# Patient Record
Sex: Female | Born: 1970 | Race: Black or African American | Hispanic: No | Marital: Single | Smoking: Former smoker
Health system: Southern US, Community
[De-identification: ages and names within clinical notes are randomized; demographics above are authoritative.]

## PROBLEM LIST (undated history)

## (undated) DIAGNOSIS — F32A Depression, unspecified: Secondary | ICD-10-CM

## (undated) DIAGNOSIS — I10 Essential (primary) hypertension: Secondary | ICD-10-CM

## (undated) DIAGNOSIS — E119 Type 2 diabetes mellitus without complications: Secondary | ICD-10-CM

## (undated) DIAGNOSIS — G709 Myoneural disorder, unspecified: Secondary | ICD-10-CM

## (undated) DIAGNOSIS — E785 Hyperlipidemia, unspecified: Secondary | ICD-10-CM

## (undated) DIAGNOSIS — F17200 Nicotine dependence, unspecified, uncomplicated: Secondary | ICD-10-CM

## (undated) DIAGNOSIS — G459 Transient cerebral ischemic attack, unspecified: Secondary | ICD-10-CM

## (undated) DIAGNOSIS — G35 Multiple sclerosis: Secondary | ICD-10-CM

## (undated) DIAGNOSIS — S31000A Unspecified open wound of lower back and pelvis without penetration into retroperitoneum, initial encounter: Secondary | ICD-10-CM

## (undated) DIAGNOSIS — I639 Cerebral infarction, unspecified: Secondary | ICD-10-CM

## (undated) DIAGNOSIS — H8110 Benign paroxysmal vertigo, unspecified ear: Secondary | ICD-10-CM

## (undated) DIAGNOSIS — F329 Major depressive disorder, single episode, unspecified: Secondary | ICD-10-CM

---

## 2002-10-17 ENCOUNTER — Emergency Department (HOSPITAL_COMMUNITY): Admission: EM | Admit: 2002-10-17 | Discharge: 2002-10-18 | Payer: Self-pay

## 2002-10-19 ENCOUNTER — Emergency Department (HOSPITAL_COMMUNITY): Admission: EM | Admit: 2002-10-19 | Discharge: 2002-10-19 | Payer: Self-pay | Admitting: Emergency Medicine

## 2002-12-07 ENCOUNTER — Other Ambulatory Visit: Admission: RE | Admit: 2002-12-07 | Discharge: 2002-12-07 | Payer: Self-pay | Admitting: *Deleted

## 2003-02-01 ENCOUNTER — Ambulatory Visit (HOSPITAL_COMMUNITY): Admission: RE | Admit: 2003-02-01 | Discharge: 2003-02-01 | Payer: Self-pay | Admitting: *Deleted

## 2003-09-08 ENCOUNTER — Emergency Department (HOSPITAL_COMMUNITY): Admission: EM | Admit: 2003-09-08 | Discharge: 2003-09-08 | Payer: Self-pay | Admitting: Family Medicine

## 2004-01-12 ENCOUNTER — Emergency Department (HOSPITAL_COMMUNITY): Admission: EM | Admit: 2004-01-12 | Discharge: 2004-01-12 | Payer: Self-pay | Admitting: Emergency Medicine

## 2004-05-24 ENCOUNTER — Emergency Department (HOSPITAL_COMMUNITY): Admission: EM | Admit: 2004-05-24 | Discharge: 2004-05-24 | Payer: Self-pay | Admitting: Family Medicine

## 2004-12-21 ENCOUNTER — Ambulatory Visit: Payer: Self-pay | Admitting: Internal Medicine

## 2004-12-25 ENCOUNTER — Ambulatory Visit: Payer: Self-pay | Admitting: *Deleted

## 2004-12-29 ENCOUNTER — Ambulatory Visit: Payer: Self-pay | Admitting: Internal Medicine

## 2005-09-20 ENCOUNTER — Ambulatory Visit: Payer: Self-pay | Admitting: Family Medicine

## 2005-09-24 ENCOUNTER — Encounter (INDEPENDENT_AMBULATORY_CARE_PROVIDER_SITE_OTHER): Payer: Self-pay | Admitting: Internal Medicine

## 2005-10-04 ENCOUNTER — Ambulatory Visit: Payer: Self-pay | Admitting: Family Medicine

## 2005-11-05 ENCOUNTER — Emergency Department (HOSPITAL_COMMUNITY): Admission: EM | Admit: 2005-11-05 | Discharge: 2005-11-05 | Payer: Self-pay | Admitting: Emergency Medicine

## 2005-11-14 ENCOUNTER — Ambulatory Visit: Payer: Self-pay | Admitting: Family Medicine

## 2005-12-05 DIAGNOSIS — G35 Multiple sclerosis: Secondary | ICD-10-CM

## 2006-01-24 ENCOUNTER — Emergency Department (HOSPITAL_COMMUNITY): Admission: EM | Admit: 2006-01-24 | Discharge: 2006-01-24 | Payer: Self-pay | Admitting: Emergency Medicine

## 2006-05-31 ENCOUNTER — Ambulatory Visit: Payer: Self-pay | Admitting: Internal Medicine

## 2006-06-06 ENCOUNTER — Ambulatory Visit: Payer: Self-pay | Admitting: Internal Medicine

## 2006-06-28 ENCOUNTER — Emergency Department (HOSPITAL_COMMUNITY): Admission: EM | Admit: 2006-06-28 | Discharge: 2006-06-28 | Payer: Self-pay | Admitting: Emergency Medicine

## 2006-09-26 ENCOUNTER — Ambulatory Visit: Payer: Self-pay | Admitting: Internal Medicine

## 2006-09-26 LAB — CONVERTED CEMR LAB
Basophils Absolute: 0 10*3/uL (ref 0.0–0.1)
Basophils Relative: 0 % (ref 0–1)
Eosinophils Absolute: 0.1 10*3/uL (ref 0.0–0.7)
Hemoglobin: 14 g/dL (ref 12.0–15.0)
MCHC: 33.6 g/dL (ref 30.0–36.0)
MCV: 95.4 fL (ref 78.0–100.0)
Monocytes Absolute: 0.5 10*3/uL (ref 0.2–0.7)
Neutro Abs: 6 10*3/uL (ref 1.7–7.7)
RDW: 13.4 % (ref 11.5–14.0)

## 2006-09-30 ENCOUNTER — Encounter (INDEPENDENT_AMBULATORY_CARE_PROVIDER_SITE_OTHER): Payer: Self-pay | Admitting: Internal Medicine

## 2006-09-30 DIAGNOSIS — I10 Essential (primary) hypertension: Secondary | ICD-10-CM

## 2006-09-30 DIAGNOSIS — E785 Hyperlipidemia, unspecified: Secondary | ICD-10-CM

## 2006-09-30 DIAGNOSIS — E119 Type 2 diabetes mellitus without complications: Secondary | ICD-10-CM

## 2006-09-30 DIAGNOSIS — F17200 Nicotine dependence, unspecified, uncomplicated: Secondary | ICD-10-CM

## 2006-09-30 HISTORY — DX: Essential (primary) hypertension: I10

## 2006-09-30 HISTORY — DX: Hyperlipidemia, unspecified: E78.5

## 2006-09-30 HISTORY — DX: Nicotine dependence, unspecified, uncomplicated: F17.200

## 2006-09-30 HISTORY — DX: Type 2 diabetes mellitus without complications: E11.9

## 2006-11-20 ENCOUNTER — Telehealth (INDEPENDENT_AMBULATORY_CARE_PROVIDER_SITE_OTHER): Payer: Self-pay | Admitting: Internal Medicine

## 2007-08-21 ENCOUNTER — Telehealth (INDEPENDENT_AMBULATORY_CARE_PROVIDER_SITE_OTHER): Payer: Self-pay | Admitting: Internal Medicine

## 2007-09-12 ENCOUNTER — Telehealth (INDEPENDENT_AMBULATORY_CARE_PROVIDER_SITE_OTHER): Payer: Self-pay | Admitting: Internal Medicine

## 2007-09-26 ENCOUNTER — Telehealth (INDEPENDENT_AMBULATORY_CARE_PROVIDER_SITE_OTHER): Payer: Self-pay | Admitting: Internal Medicine

## 2007-10-09 ENCOUNTER — Ambulatory Visit: Payer: Self-pay | Admitting: Internal Medicine

## 2007-10-09 LAB — CONVERTED CEMR LAB
Bilirubin Urine: NEGATIVE
Blood Glucose, Fingerstick: 199
Ketones, urine, test strip: NEGATIVE
Nitrite: NEGATIVE
Urobilinogen, UA: 0.2

## 2007-10-16 ENCOUNTER — Encounter (INDEPENDENT_AMBULATORY_CARE_PROVIDER_SITE_OTHER): Payer: Self-pay | Admitting: Internal Medicine

## 2007-10-23 ENCOUNTER — Ambulatory Visit: Payer: Self-pay | Admitting: Internal Medicine

## 2007-10-23 LAB — CONVERTED CEMR LAB
AST: 17 units/L (ref 0–37)
Albumin: 4.4 g/dL (ref 3.5–5.2)
Alkaline Phosphatase: 69 units/L (ref 39–117)
BUN: 11 mg/dL (ref 6–23)
Basophils Relative: 0 % (ref 0–1)
Creatinine, Ser: 0.69 mg/dL (ref 0.40–1.20)
Eosinophils Absolute: 0.1 10*3/uL (ref 0.0–0.7)
Eosinophils Relative: 1 % (ref 0–5)
Glucose, Bld: 154 mg/dL — ABNORMAL HIGH (ref 70–99)
HCT: 38.8 % (ref 36.0–46.0)
HDL: 53 mg/dL (ref >39)
Hemoglobin: 13.6 g/dL (ref 12.0–15.0)
LDL Cholesterol: 68 mg/dL (ref 0–99)
Lymphs Abs: 2.3 10*3/uL (ref 0.7–4.0)
MCHC: 35.1 g/dL (ref 30.0–36.0)
MCV: 93.3 fL (ref 78.0–100.0)
Monocytes Absolute: 0.5 10*3/uL (ref 0.1–1.0)
Monocytes Relative: 5 % (ref 3–12)
Potassium: 3.4 meq/L — ABNORMAL LOW (ref 3.5–5.3)
RBC: 4.16 M/uL (ref 3.87–5.11)
Total CHOL/HDL Ratio: 2.5
Triglycerides: 66 mg/dL (ref <150)
WBC: 9 10*3/uL (ref 4.0–10.5)

## 2007-11-04 ENCOUNTER — Encounter (INDEPENDENT_AMBULATORY_CARE_PROVIDER_SITE_OTHER): Payer: Self-pay | Admitting: Internal Medicine

## 2007-11-06 ENCOUNTER — Encounter (INDEPENDENT_AMBULATORY_CARE_PROVIDER_SITE_OTHER): Payer: Self-pay | Admitting: Internal Medicine

## 2008-12-01 ENCOUNTER — Encounter (INDEPENDENT_AMBULATORY_CARE_PROVIDER_SITE_OTHER): Payer: Self-pay | Admitting: Internal Medicine

## 2008-12-02 ENCOUNTER — Ambulatory Visit: Payer: Self-pay | Admitting: Internal Medicine

## 2008-12-02 LAB — CONVERTED CEMR LAB: Blood Glucose, Fingerstick: 99

## 2008-12-03 ENCOUNTER — Telehealth (INDEPENDENT_AMBULATORY_CARE_PROVIDER_SITE_OTHER): Payer: Self-pay | Admitting: Internal Medicine

## 2008-12-15 ENCOUNTER — Ambulatory Visit: Payer: Self-pay | Admitting: Internal Medicine

## 2008-12-15 ENCOUNTER — Encounter: Payer: Self-pay | Admitting: Physician Assistant

## 2008-12-30 ENCOUNTER — Ambulatory Visit: Payer: Self-pay | Admitting: Internal Medicine

## 2009-02-17 ENCOUNTER — Telehealth: Payer: Self-pay | Admitting: Physician Assistant

## 2009-03-01 ENCOUNTER — Emergency Department (HOSPITAL_COMMUNITY): Admission: EM | Admit: 2009-03-01 | Discharge: 2009-03-01 | Payer: Self-pay | Admitting: Family Medicine

## 2009-03-03 ENCOUNTER — Ambulatory Visit: Payer: Self-pay | Admitting: Internal Medicine

## 2009-03-03 DIAGNOSIS — IMO0002 Reserved for concepts with insufficient information to code with codable children: Secondary | ICD-10-CM

## 2009-03-08 ENCOUNTER — Telehealth (INDEPENDENT_AMBULATORY_CARE_PROVIDER_SITE_OTHER): Payer: Self-pay | Admitting: Internal Medicine

## 2009-03-16 ENCOUNTER — Ambulatory Visit: Payer: Self-pay | Admitting: Physician Assistant

## 2009-03-16 LAB — CONVERTED CEMR LAB
Blood Glucose, Fingerstick: 221
Hgb A1c MFr Bld: 6.2 %

## 2009-03-17 ENCOUNTER — Ambulatory Visit (HOSPITAL_COMMUNITY): Admission: RE | Admit: 2009-03-17 | Discharge: 2009-03-17 | Payer: Self-pay | Admitting: Physician Assistant

## 2009-03-23 ENCOUNTER — Telehealth (INDEPENDENT_AMBULATORY_CARE_PROVIDER_SITE_OTHER): Payer: Self-pay | Admitting: Internal Medicine

## 2009-03-25 ENCOUNTER — Encounter: Payer: Self-pay | Admitting: Physician Assistant

## 2009-04-08 ENCOUNTER — Encounter (INDEPENDENT_AMBULATORY_CARE_PROVIDER_SITE_OTHER): Payer: Self-pay | Admitting: Internal Medicine

## 2009-04-14 ENCOUNTER — Ambulatory Visit: Payer: Self-pay | Admitting: Internal Medicine

## 2009-04-14 DIAGNOSIS — G5600 Carpal tunnel syndrome, unspecified upper limb: Secondary | ICD-10-CM | POA: Insufficient documentation

## 2009-06-03 ENCOUNTER — Emergency Department (HOSPITAL_COMMUNITY): Admission: EM | Admit: 2009-06-03 | Discharge: 2009-06-03 | Payer: Self-pay | Admitting: Emergency Medicine

## 2009-06-16 ENCOUNTER — Ambulatory Visit: Payer: Self-pay | Admitting: Internal Medicine

## 2009-06-16 ENCOUNTER — Telehealth (INDEPENDENT_AMBULATORY_CARE_PROVIDER_SITE_OTHER): Payer: Self-pay | Admitting: Internal Medicine

## 2009-06-16 LAB — CONVERTED CEMR LAB: Blood Glucose, Fingerstick: 171

## 2009-06-29 ENCOUNTER — Telehealth (INDEPENDENT_AMBULATORY_CARE_PROVIDER_SITE_OTHER): Payer: Self-pay | Admitting: Internal Medicine

## 2009-07-12 ENCOUNTER — Telehealth (INDEPENDENT_AMBULATORY_CARE_PROVIDER_SITE_OTHER): Payer: Self-pay | Admitting: Internal Medicine

## 2009-07-30 ENCOUNTER — Encounter (INDEPENDENT_AMBULATORY_CARE_PROVIDER_SITE_OTHER): Payer: Self-pay | Admitting: Internal Medicine

## 2009-08-10 ENCOUNTER — Telehealth (INDEPENDENT_AMBULATORY_CARE_PROVIDER_SITE_OTHER): Payer: Self-pay | Admitting: Internal Medicine

## 2009-09-29 ENCOUNTER — Encounter (INDEPENDENT_AMBULATORY_CARE_PROVIDER_SITE_OTHER): Payer: Self-pay | Admitting: *Deleted

## 2009-12-10 ENCOUNTER — Emergency Department (HOSPITAL_COMMUNITY): Admission: EM | Admit: 2009-12-10 | Discharge: 2009-12-10 | Payer: Self-pay | Admitting: Emergency Medicine

## 2009-12-27 ENCOUNTER — Telehealth (INDEPENDENT_AMBULATORY_CARE_PROVIDER_SITE_OTHER): Payer: Self-pay | Admitting: Internal Medicine

## 2010-01-19 ENCOUNTER — Ambulatory Visit: Payer: Self-pay | Admitting: Internal Medicine

## 2010-01-19 ENCOUNTER — Encounter (INDEPENDENT_AMBULATORY_CARE_PROVIDER_SITE_OTHER): Payer: Self-pay | Admitting: Internal Medicine

## 2010-01-24 LAB — CONVERTED CEMR LAB
Albumin: 4.8 g/dL (ref 3.5–5.2)
BUN: 13 mg/dL (ref 6–23)
CO2: 27 meq/L (ref 19–32)
Calcium: 10.3 mg/dL (ref 8.4–10.5)
Chloride: 99 meq/L (ref 96–112)
Cholesterol: 197 mg/dL (ref 0–200)
Glucose, Bld: 101 mg/dL — ABNORMAL HIGH (ref 70–99)
LDL Cholesterol: 124 mg/dL — ABNORMAL HIGH (ref 0–99)
Lymphocytes Relative: 30 % (ref 12–46)
Lymphs Abs: 2.7 10*3/uL (ref 0.7–4.0)
Monocytes Relative: 8 % (ref 3–12)
Neutrophils Relative %: 61 % (ref 43–77)
Platelets: 321 10*3/uL (ref 150–400)
Potassium: 3.2 meq/L — ABNORMAL LOW (ref 3.5–5.3)
RBC: 4.51 M/uL (ref 3.87–5.11)
Sodium: 141 meq/L (ref 135–145)
Total Protein: 7.8 g/dL (ref 6.0–8.3)
Triglycerides: 84 mg/dL (ref <150)
WBC: 9 10*3/uL (ref 4.0–10.5)

## 2010-01-27 ENCOUNTER — Telehealth (INDEPENDENT_AMBULATORY_CARE_PROVIDER_SITE_OTHER): Payer: Self-pay | Admitting: Internal Medicine

## 2010-01-27 ENCOUNTER — Encounter (INDEPENDENT_AMBULATORY_CARE_PROVIDER_SITE_OTHER): Payer: Self-pay | Admitting: Internal Medicine

## 2010-01-27 ENCOUNTER — Ambulatory Visit
Admission: RE | Admit: 2010-01-27 | Discharge: 2010-01-27 | Payer: Self-pay | Source: Home / Self Care | Attending: Internal Medicine | Admitting: Internal Medicine

## 2010-01-27 DIAGNOSIS — H8110 Benign paroxysmal vertigo, unspecified ear: Secondary | ICD-10-CM

## 2010-01-27 HISTORY — DX: Benign paroxysmal vertigo, unspecified ear: H81.10

## 2010-02-12 ENCOUNTER — Encounter: Payer: Self-pay | Admitting: Internal Medicine

## 2010-02-21 NOTE — Progress Notes (Signed)
Summary: NEUROLOGY REFERRAL   Phone Note Call from Patient   Reason for Call: Referral Summary of Call: I  JUST WANT TO LET YOU KNOW THAT I CALL [facility]  TO DAY TO F/U IN HER REFERRAL . THEY SAID THAT PT HAVE A BALANCE OF $351.00 . THEY CONTACT HER ON 07-07-09 . AND I CALL THE PT ON 6-21,6-22,6-23 UNTIL TODAY THAT I TALK TO HER SHE SAID THAT SHE IS GOING TO CALL [facility] TO LOWER HER BALANCE. Initial call taken by: Cheryll Dessert,  July 15, 2009 4:02 PM  Follow-up for Phone Call        Please make sure she lets [place] know when they are ready for rereferral.   Follow-up by: Julieanne Manson MD,  July 21, 2009 5:22 PM  Additional Follow-up for Phone Call Additional follow up Details #1::        I TALK TO MS Fabro SHE SAID THAT [facility]  THEY CAN'T SEE HER BECAUSE OF THE BALANCE AND SHE IS IN THE PROCESS TO GET MEDICAID. SHE WILL LET us KNOW  WHEN SHE GET APPROVE SO WE CAN REFERRAL TO [facility] IN [place] . Additional Follow-up by: Cheryll Dessert,  August 05, 2009 9:30 AM

## 2010-02-21 NOTE — Progress Notes (Signed)
Summary: NEEDS LETTER TO GO BACK TO WORK  Phone Note Call from Patient Call back at [phone]   Summary of Call: PT CALLED WANTING TO KNOW IF MRI HAS BEEN SET UP YET/NEEDS TO KNOW WHEN SHE CAN GO BACK TO WORK//WILL NEED A NOTE/CAN COME BY TO PICK THAT UP/PLEASE GIVE HER A CALL @ [phone] Initial call taken by: Arta Bruce,  March 23, 2009 9:39 AM  Follow-up for Phone Call        MS Klomp STOPPED BY TO SEE IF SHE CAN GET A NOTE FOR WORK. SHE SAYS HER JOB IS NEEDING THIS NOTE BY TOMORROW AND IT NEEDS TO STATE WHATS GOING ON WITH HER AND WHEN CAN SHE RETURN TO WORK.  SHE DOES WANT [place] TO SCHED. THE MRI FOR HER BECAUSE SHE IS NO BETTER.   SHE WILL COME BY AND PICK IT UP. Follow-up by: Leodis Rains,  March 24, 2009 4:15 PM  Additional Follow-up for Phone Call Additional follow up Details #1::        She was given off until 03/23/09 by Lorin Picket.  Will have him take a look at this.   Additional Follow-up by: Julieanne Manson MD,  March 25, 2009 2:16 PM    Additional Follow-up for Phone Call Additional follow up Details #2::    Spoke to patient. Arm feels the same. She needs a note for work because they will not let her do light duty. Note written and at front for patient to pick up.  Please schedule MRI of neck and shoulder. Also schedule earlier f/u with Dr. Delrae Alfred (or me if no available appts with Dr. Delrae Alfred) in next 1-2 weeks. Follow-up by: Tereso Newcomer PA-C,  March 25, 2009 2:29 PM  Additional Follow-up for Phone Call Additional follow up Details #3:: Details for Additional Follow-up Action Taken: MRI scheduled for 3/18.  Left msg for pt to call......... Elmarie Shiley McCoy CMA  April 01, 2009 4:11 PM   Pt aware of appt, states she is claustrophobic and will need med for her nerves, also needs continued work note until after her MRI appt on next Friday.......... Vesta Mixer CMA  April 01, 2009 4:28   Rx for Ativan to [facility] [address]please let pt. know she  needs to have someone else drive her for the MRI  because of the med.  Note for work signed yesterday.  Julieanne Manson MD  April 02, 2009 2:32 PM   Pt aware and will also come in on Monday to pick up work note. Additional Follow-up by: Vesta Mixer CMA,  April 04, 2009 11:16 AM  New/Updated Medications: ATIVAN 1 MG TABS (LORAZEPAM) 1 tab by mouth 1/2 hour prior to MRI Prescriptions: ATIVAN 1 MG TABS (LORAZEPAM) 1 tab by mouth 1/2 hour prior to MRI  #1 x 0   Entered and Authorized by:   Julieanne Manson MD   Signed by:   Julieanne Manson MD on 04/02/2009   Method used:   Printed then [facility]ed to ...       [facility]  [address] [id number]       [address]       [place], [place]  [address]       Ph: [phone] or [phone]       Fax: [phone]   RxID:   [id number]     Impression & Recommendations:  Problem # 1:  SHOULDER STRAIN, LEFT (ICD-840.9)  mild arthritic changes on neck xray shoulder xray ok ? etiology of symptoms arrange MRI of neck and shoulder earlier f/u will give note to remain out of work for now  Orders: MRI (MRI)  Complete Medication List: 1)  Metformin Hcl 500 Mg Tabs (Metformin hcl) .Marland Kitchen.. 1 tab po daily 2)  Adult Aspirin Ec Low Strength 81 Mg Tbec (Aspirin) .Marland Kitchen.. 1 tab by mouth daily 3)  Norvasc 10 Mg Tabs (Amlodipine besylate) .Marland Kitchen.. 1 tab po daily 4)  Benazepril Hcl 20 Mg Tabs (Benazepril hcl) .Marland Kitchen.. 1 tab po daily 5)  Lipitor 10 Mg Tabs (Atorvastatin calcium) .Marland Kitchen.. 1 tab by mouth daily 6)  Diovan 160 Mg Tabs (Valsartan) .Marland Kitchen.. 1 tab by mouth daily 7)  Hydrochlorothiazide 25 Mg Tabs (Hydrochlorothiazide) .Marland Kitchen.. 1 tab by mouth daily 8)  Metoprolol Tartrate 25 Mg Tabs (Metoprolol tartrate) .Marland Kitchen.. 1 by mouth two times a day 9)  Metaxalone 800 Mg Tabs (Metaxalone) .Marland Kitchen.. 1 tab by mouth every 8 hours as needed for shoulder pain--ed visit 10)  Katheren Puller Presto W/device Kit (Blood glucose monitoring suppl) .... Use to check sugars two  times a day 11)  Wavesense Presto Test Strp (Glucose blood) .... Two times a day blood sugar checks 12)  Wavesense Ultra-thin Lancets Misc (Lancets) .... Two times a day blood sugar checks 13)  Ibuprofen 800 Mg Tabs (Ibuprofen) .... Take 1 tablet by mouth three times a day as needed 14)  Ativan 1 Mg Tabs (Lorazepam) .Marland Kitchen.. 1 tab by mouth 1/2 hour prior to [facility]

## 2010-02-21 NOTE — Miscellaneous (Signed)
Summary: eye exam documentation  Clinical Lists Changes  Observations: Added new observation of DIAB EYE EX: Dr. Dione Booze:  Hx of Right optic neuritis--no sign of that today, Glaucoma suspect.  To follow up in 6 weeks to recheck pressure. (11/06/2007 7:13)  Appended Document: eye exam documentation    Clinical Lists Changes  Observations: Added new observation of DIAB EYE EX: Dr. Dione Booze:  glaucoma suspect.  Follow up in 1 year (12/01/2008 14:36)

## 2010-02-21 NOTE — Progress Notes (Signed)
Summary: DIAGNOSED W/ VERTIGO  Phone Note Call from Patient Call back at Home Phone [phone]   Reason for Call: Talk to Nurse Summary of Call: [facility]. MS Crenshaw WENT TO [place] LAST SATURDAY OR SUNDAY THAT SHE COULD REMEMBER. SHE WAS DIAGNOSED WITH VERTIG, AND WAS GIVE MECLIZINE 25MG  FOR 10 DAYS. SHE FINISHED THE MEDICINE, AND SHE IS STILL DIZZY NASUATED AND VOMITING. SHE SAYS THAT EVERTHING SHE SMELLS SHE VOMITS. Initial call taken by: Leodis Rains,  December 27, 2009 11:20 AM  Follow-up for Phone Call        ED report on your desk.  Dutch Quint RN  December 27, 2009 5:35 PM  Which desk?  Julieanne Manson MD  December 27, 2009 7:44 PM    Additional Follow-up for Phone Call Additional follow up Details #1::        Is she not any better? Is it the same or worse? Was she better while taking the meclizine? She can actually get it otc if it did help Additional Follow-up by: Julieanne Manson MD,  December 27, 2009 7:44 PM    Additional Follow-up for Phone Call Additional follow up Details #2::    Improved with medication, however currently out. Continues to have some vertigo and nausea no further emesis. Discussed safety issues getting up slowly, do not drive when symptoms are bad, move head from side to side slowly. Get OTC Meclizine. If no improvement contact office. Advised may reoccur keep medication on hand. Follow-up by: Gaylyn Cheers RN,  December 28, 2009 9:39 AM

## 2010-02-21 NOTE — Assessment & Plan Note (Signed)
Summary: 2  MONTH FU///KT   Vital Signs:  Patient profile:   40 year old female Menstrual status:  regular Weight:      180 pounds Temp:     98.0 degrees F Pulse rate:   92 / minute Pulse rhythm:   regular BP sitting:   118 / 78  (left arm) Cuff size:   regular  Vitals Entered By: Vesta Mixer CMA (Jun 16, 2009 2:22 PM) CC: 2 month f/u. Is Patient Diabetic? Yes Pain Assessment Patient in pain? no      CBG Result 171  Does patient need assistance? Ambulation Normal   CC:  2 month f/u.Marland Kitchen  History of Present Illness: 1.  MS:  pt. developed left  retrobulbar optic neuritis.  Was seen 06/03/09 by Dr. Dione Booze.  Was sent to ED and underwent MRI which showed widespread demyelinating process throught out white matter of brain.  Started on prednisone burst and taper and vision is improving.  Followed up with Dr. Dione Booze today and vision definitely improved.  Pt. was referred to Neurology in 2007 following similar occurence with right eye--was followed by Dr. Chales Abrahams then.  Did not keep her Neurology referral at that time.  In 2009, was referred to Dr. Dione Booze and Dr Vickey Huger, Neurology for follwoup.  Not clear she ever went to Neurology appt.  Has not followed up since with Neuro.  Pt. now with Medicaid.  Has 11 more days of Prednisone 10 mg.  2.  DM:  Pt. states sugars even on prednisone are running up to 120.  No numbness or tingling in hands or feet.  3.  Left shoulder pain:  fine now.  Never heard from PT, however.  4.  Hypertension:  taking meds  Pt. switching to Medicaid coverage now--will need to switch meds to retail pharmacy.  Allergies (verified): No Known Drug Allergies  Physical Exam  General:  NAD Lungs:  Normal respiratory effort, chest expands symmetrically. Lungs are clear to auscultation, no crackles or wheezes. Heart:  Normal rate and regular rhythm. S1 and S2 normal without gallop, murmur, click, rub or other extra sounds.  Radial pulses normal and equal Msk:  Left  shoulder with full ROm and no pain Extremities:  No edema Neurologic:  alert & oriented X3, cranial nerves II-XII intact, and gait normal.     Impression & Recommendations:  Problem # 1:  SHOULDER STRAIN, LEFT (ICD-840.9) Resolved  Problem # 2:  MULTIPLE SCLEROSIS (ICD-340) Currently treatment for related optic neuritis of left eye Orders: Neurology Referral (Neuro)  Problem # 3:  DYSLIPIDEMIA (ICD-272.4) Med change after this month. Has not had lab check in some time Her updated medication list for this problem includes:    Simvastatin 20 Mg Tabs (Simvastatin) .Marland Kitchen... 1 tab by mouth daily  Problem # 4:  HYPERTENSION (ICD-401.9) BP controlled--changing meds, however for Medicaid. The following medications were removed from the medication list:    Diovan 160 Mg Tabs (Valsartan) .Marland Kitchen... 1 tab by mouth daily Her updated medication list for this problem includes:    Amlodipine Besylate 10 Mg Tabs (Amlodipine besylate) .Marland Kitchen... 1 tab by mouth daily    Benazepril Hcl 20 Mg Tabs (Benazepril hcl) .Marland Kitchen... 1 tab po daily    Hydrochlorothiazide 25 Mg Tabs (Hydrochlorothiazide) .Marland Kitchen... 1 tab by mouth daily    Metoprolol Tartrate 25 Mg Tabs (Metoprolol tartrate) .Marland Kitchen... 1 by mouth two times a day    Losartan Potassium 50 Mg Tabs (Losartan potassium) .Marland Kitchen... 1 tab by mouth daily  Problem # 5:  DIABETES MELLITUS, TYPE II (ICD-250.00) Has been controlled in past and okay today. The following medications were removed from the medication list:    Diovan 160 Mg Tabs (Valsartan) .Marland Kitchen... 1 tab by mouth daily Her updated medication list for this problem includes:    Metformin Hcl 500 Mg Tabs (Metformin hcl) .Marland Kitchen... 1 tab po daily    Adult Aspirin Ec Low Strength 81 Mg Tbec (Aspirin) .Marland Kitchen... 1 tab by mouth daily    Benazepril Hcl 20 Mg Tabs (Benazepril hcl) .Marland Kitchen... 1 tab po daily    Losartan Potassium 50 Mg Tabs (Losartan potassium) .Marland Kitchen... 1 tab by mouth daily  Orders: Capillary Blood Glucose/CBG (53664)  Complete  Medication List: 1)  Metformin Hcl 500 Mg Tabs (Metformin hcl) .Marland Kitchen.. 1 tab po daily 2)  Adult Aspirin Ec Low Strength 81 Mg Tbec (Aspirin) .Marland Kitchen.. 1 tab by mouth daily 3)  Amlodipine Besylate 10 Mg Tabs (Amlodipine besylate) .Marland Kitchen.. 1 tab by mouth daily 4)  Benazepril Hcl 20 Mg Tabs (Benazepril hcl) .Marland Kitchen.. 1 tab po daily 5)  Simvastatin 20 Mg Tabs (Simvastatin) .Marland Kitchen.. 1 tab by mouth daily 6)  Hydrochlorothiazide 25 Mg Tabs (Hydrochlorothiazide) .Marland Kitchen.. 1 tab by mouth daily 7)  Metoprolol Tartrate 25 Mg Tabs (Metoprolol tartrate) .Marland Kitchen.. 1 by mouth two times a day 8)  Metaxalone 800 Mg Tabs (Metaxalone) .Marland Kitchen.. 1 tab by mouth every 8 hours as needed for shoulder pain--ed visit 9)  Katheren Puller Presto W/device Kit (Blood glucose monitoring suppl) .... Use to check sugars two times a day 10)  Wavesense Presto Test Strp (Glucose blood) .... Two times a day blood sugar checks 11)  Wavesense Ultra-thin Lancets Misc (Lancets) .... Two times a day blood sugar checks 12)  Ibuprofen 800 Mg Tabs (Ibuprofen) .... Take 1 tablet by mouth three times a day as needed 13)  Ativan 1 Mg Tabs (Lorazepam) .Marland Kitchen.. 1 tab by mouth 1/2 hour prior to mri 14)  Prednisone 10 Mg Tabs (Prednisone) .... Taper 15)  Losartan Potassium 50 Mg Tabs (Losartan potassium) .Marland Kitchen.. 1 tab by mouth daily  Patient Instructions: 1)  Follow up with Dr. Delrae Alfred in 2 months for fasting labs:  FLP, CMET, CBC, urine microalbumin, A1C.   2)  OV with Dr. Delrae Alfred 2 days after labs done please Prescriptions: METOPROLOL TARTRATE 25 MG TABS (METOPROLOL TARTRATE) 1 by mouth two times a day  #60 Each x 11   Entered and Authorized by:   Julieanne Manson MD   Signed by:   Julieanne Manson MD on 06/16/2009   Method used:   [facility]  [address] [id number]       [address]       [place], [place]  [address]       Ph: [phone] or [phone]       Fax: [phone]   RxID:   [id number] HYDROCHLOROTHIAZIDE 25 MG   TABS (HYDROCHLOROTHIAZIDE) 1 tab by mouth daily  #30 x 11   Entered and Authorized by:   Julieanne Manson MD   Signed by:   Julieanne Manson MD on 06/16/2009   Method used:   [facility]ically [facility]        [facility]  [address] [id number]       [address]       [place], [place]  [address]  Ph: [phone] or [phone]       Fax: [phone]   RxID:   [id number] VOZDGUYQ POTASSIUM 50 MG TABS (LOSARTAN POTASSIUM) 1 tab by mouth daily  #30 x 11   Entered and Authorized by:   Julieanne Manson MD   Signed by:   Julieanne Manson MD on 06/16/2009   Method used:   [facility]ically [facility]        [facility]  [address] [id number]       [address]       [place], [place]  [address]       Ph: [phone] or [phone]       Fax: [phone]   RxID:   [id number] AMLODIPINE BESYLATE 10 MG TABS (AMLODIPINE BESYLATE) 1 tab by mouth daily  #30 x 11   Entered and Authorized by:   Julieanne Manson MD   Signed by:   Julieanne Manson MD on 06/16/2009   Method used:   [facility]ronically to        [facility]  [address] [id number]       [address]       [place], [place]  [address]       Ph: [phone] or [phone]       Fax: [phone]   RxID:   [id number] SIMVASTATIN 20 MG TABS (SIMVASTATIN) 1 tab by mouth daily  #30 x 11   Entered and Authorized by:   Julieanne Manson MD   Signed by:   Julieanne Manson MD on 06/16/2009   Method used:   [facility]ronically to        [facility]  [address] [id number]       [address]       [place], [place]  [address]       Ph: [phone] or [phone]       Fax: [phone]   RxID:   [id number] BENAZEPRIL HCL 20 MG  TABS (BENAZEPRIL HCL) 1 tab po daily  #30 x 11   Entered and Authorized by:   Julieanne Manson MD   Signed by:   Julieanne Manson MD on 06/16/2009   Method used:   [facility]ronically to        [facility]  [address] [id number]       [address]       [place], [place]  [address]       Ph: [phone] or [phone]       Fax: [phone]   RxID:   [id number] ADULT ASPIRIN EC LOW STRENGTH 81 MG  TBEC (ASPIRIN) 1 tab by mouth daily  #30 x 11   Entered and Authorized by:   Julieanne Manson MD   Signed by:   Julieanne Manson MD on 06/16/2009   Method used:   Electronically to        [facility]  [address] [id number]       [address]       [place], [place]  [address]       Ph: [phone] or [phone]       Fax: [phone]   RxID:   [id number] METFORMIN HCL 500 MG  TABS (METFORMIN HCL) 1 tab po daily  #30 x 11   Entered and Authorized by:   Julieanne Manson MD   Signed  by:   Julieanne Manson MD on 06/16/2009   Method used:   Electronically to        [facility]  [address] [id number]       [address]       [place], [place]  [address]       Ph: [phone] or [phone]       Fax: [phone]   RxID:   [id number]

## 2010-02-21 NOTE — Assessment & Plan Note (Signed)
Summary: fu after mri///kt   Vital Signs:  Patient profile:   40 year old female Menstrual status:  regular Weight:      183.19 pounds BMI:     34.74 Temp:     97.9 degrees F Pulse rate:   92 / minute Pulse rhythm:   regular Resp:     16 per minute BP sitting:   125 / 91  (left arm) Cuff size:   regular  Vitals Entered By: Chauncy Passy, SMA CC: Pt. is here for a f/u. Medicaid has denied the MRI so pt. is here to be referred out to a PT.  Is Patient Diabetic? Yes Pain Assessment Patient in pain? no      CBG Result 101  Does patient need assistance? Functional Status Self care Ambulation Normal   CC:  Pt. is here for a f/u. Medicaid has denied the MRI so pt. is here to be referred out to a PT. Marland Kitchen  History of Present Illness: 1.  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  Left shoulder films were normal.  MRI was ordered and denied by Medicaid.  Pt. states her shoulder area is much better, but still having intermittent numbness and tingling in her left thumb.  Sometimes has aching in her shoulder, but does not have to be at same time thumb is symptomatic.    Left hand goes numb while sleeping often.  Current Medications (verified): 1)  Metformin Hcl 500 Mg  Tabs (Metformin Hcl) .Marland Kitchen.. 1 Tab Po Daily 2)  Adult Aspirin Ec Low Strength 81 Mg  Tbec (Aspirin) .Marland Kitchen.. 1 Tab By Mouth Daily 3)  Norvasc 10 Mg  Tabs (Amlodipine Besylate) .Marland Kitchen.. 1 Tab Po Daily 4)  Benazepril Hcl 20 Mg  Tabs (Benazepril Hcl) .Marland Kitchen.. 1 Tab Po Daily 5)  Lipitor 10 Mg  Tabs (Atorvastatin Calcium) .Marland Kitchen.. 1 Tab By Mouth Daily 6)  Diovan 160 Mg  Tabs (Valsartan) .Marland Kitchen.. 1 Tab By Mouth Daily 7)  Hydrochlorothiazide 25 Mg  Tabs (Hydrochlorothiazide) .Marland Kitchen.. 1 Tab By Mouth Daily 8)  Metoprolol Tartrate 25 Mg Tabs (Metoprolol Tartrate) .Marland Kitchen.. 1 By Mouth Two Times A Day 9)  Metaxalone 800 Mg Tabs (Metaxalone) .Marland Kitchen.. 1 Tab By Mouth Every 8 Hours As  Needed For Shoulder Pain--Ed Visit 10)  Katheren Puller Presto W/device Kit (Blood Glucose Monitoring Suppl) .... Use To Check Sugars Two Times A Day 11)  Wavesense Presto Test  Strp (Glucose Blood) .... Two Times A Day Blood Sugar Checks 12)  Wavesense Ultra-Thin Lancets  Misc (Lancets) .... Two Times A Day Blood Sugar Checks 13)  Ibuprofen 800 Mg Tabs (Ibuprofen) .... Take 1 Tablet By Mouth Three Times A Day As Needed 14)  Ativan 1 Mg Tabs (Lorazepam) .Marland Kitchen.. 1 Tab By Mouth 1/2 Hour Prior To Mri  Allergies (verified): No Known Drug Allergies  Physical Exam  General:  NAD Msk:  Tender over trap to left shoulder.  Full ROM of shoulder without obvious pain. Neurologic:  Positive Phalen's of left thumb.  Negative Tinel's.   Impression & Recommendations:  Problem # 1:  SHOULDER STRAIN, LEFT (ICD-840.9)  Feel the thumb is likely a separate issues  Orders: Physical Therapy Referral (PT)  Problem # 2:  CARPAL TUNNEL SYNDROME, LEFT (ICD-354.0) Cock up splint given Orders: Physical Therapy Referral (PT)  Complete Medication List: 1)  Metformin Hcl 500 Mg Tabs (Metformin hcl) .Marland Kitchen.. 1 tab po  daily 2)  Adult Aspirin Ec Low Strength 81 Mg Tbec (Aspirin) .Marland Kitchen.. 1 tab by mouth daily 3)  Norvasc 10 Mg Tabs (Amlodipine besylate) .Marland Kitchen.. 1 tab po daily 4)  Benazepril Hcl 20 Mg Tabs (Benazepril hcl) .Marland Kitchen.. 1 tab po daily 5)  Lipitor 10 Mg Tabs (Atorvastatin calcium) .Marland Kitchen.. 1 tab by mouth daily 6)  Diovan 160 Mg Tabs (Valsartan) .Marland Kitchen.. 1 tab by mouth daily 7)  Hydrochlorothiazide 25 Mg Tabs (Hydrochlorothiazide) .Marland Kitchen.. 1 tab by mouth daily 8)  Metoprolol Tartrate 25 Mg Tabs (Metoprolol tartrate) .Marland Kitchen.. 1 by mouth two times a day 9)  Metaxalone 800 Mg Tabs (Metaxalone) .Marland Kitchen.. 1 tab by mouth every 8 hours as needed for shoulder pain--ed visit 10)  Katheren Puller Presto W/device Kit (Blood glucose monitoring suppl) .... Use to check sugars two times a day 11)  Wavesense Presto Test Strp (Glucose blood) .... Two times a day  blood sugar checks 12)  Wavesense Ultra-thin Lancets Misc (Lancets) .... Two times a day blood sugar checks 13)  Ibuprofen 800 Mg Tabs (Ibuprofen) .... Take 1 tablet by mouth three times a day as needed 14)  Ativan 1 Mg Tabs (Lorazepam) .Marland Kitchen.. 1 tab by mouth 1/2 hour prior to mri  Other Orders: Capillary Blood Glucose/CBG (59563)  Laboratory Results   Blood Tests     CBG Random:: 101mg /dL      Appended Document: fu after mri///kt Return to work note given Pt. to follow up with me in 2 months.

## 2010-02-21 NOTE — Letter (Signed)
Summary: [facility]   Imported By: Arta Bruce 08/17/2009 14:50:23  _____________________________________________________________________  External Attachment:    Type:   Image     Comment:   External Document

## 2010-02-21 NOTE — Assessment & Plan Note (Signed)
Summary: ([name] [name])THUNB HAS GONE NUMB//SS   Vital Signs:  Patient profile:   40 year old female Menstrual status:  regular Height:      61 inches Weight:      188 pounds BMI:     35.65 Temp:     98.2 degrees F oral Pulse rate:   83 / minute Pulse rhythm:   regular Resp:     18 per minute BP sitting:   157 / 88  (left arm) Cuff size:   regular  Vitals Entered By: Armenia Shannon (March 16, 2009 12:14 PM) CC: [name] says she has pain in her left arm and thumb that started two weeks....  [name] says she has been using heat compresses and taking meds that ER gave her.... Is Patient Diabetic? Yes Pain Assessment Patient in pain? no      CBG Result 221  Does patient need assistance? Functional Status Self care Ambulation Normal   CC:  [name] says she has pain in her left arm and thumb that started two weeks....  [name] says she has been using heat compresses and taking meds that ER gave her.....  History of Present Illness: 40 year old female presents for followup on her arm pain.  She went to the emergency room 2 weeks ago with shoulder pain.  She was given diclofenac as well as a muscle x-ray.  She saw Dr. Delrae Alfred on February 10.  It was felt that she had a strain.  She notes continued pain in her shoulder, neck and arm.  She feels it radiating down her arm into her thumb.  Thumb now feels somewhat numb.  She feels like her arm is getting heavy at times.  She knows that Motrin does help more than diclofenac.  She has not tried the muscle relaxer.  She works at a [employer] and cannot go back to work and arm feels better.  She needs a note to return to work today.  Current Medications (verified): 1)  Metformin Hcl 500 Mg  Tabs (Metformin Hcl) .Marland Kitchen.. 1 Tab Po Daily 2)  Adult Aspirin Ec Low Strength 81 Mg  Tbec (Aspirin) .Marland Kitchen.. 1 Tab By Mouth Daily 3)  Norvasc 10 Mg  Tabs (Amlodipine Besylate) .Marland Kitchen.. 1 Tab Po Daily 4)  Benazepril Hcl 20 Mg  Tabs (Benazepril Hcl) .Marland Kitchen.. 1 Tab Po Daily 5)   Lipitor 10 Mg  Tabs (Atorvastatin Calcium) .Marland Kitchen.. 1 Tab By Mouth Daily 6)  Diovan 160 Mg  Tabs (Valsartan) .Marland Kitchen.. 1 Tab By Mouth Daily 7)  Hydrochlorothiazide 25 Mg  Tabs (Hydrochlorothiazide) .Marland Kitchen.. 1 Tab By Mouth Daily 8)  Metoprolol Tartrate 25 Mg Tabs (Metoprolol Tartrate) .Marland Kitchen.. 1 By Mouth Two Times A Day 9)  Diclofenac Sodium 75 Mg Tbec (Diclofenac Sodium) .Marland Kitchen.. 1 Tab By Mouth Two Times A Day--From Ed 10)  Metaxalone 800 Mg Tabs (Metaxalone) .Marland Kitchen.. 1 Tab By Mouth Every 8 Hours As Needed For Shoulder Pain--Ed Visit 11)  Wavesense Presto W/device Kit (Blood Glucose Monitoring Suppl) .... Use To Check Sugars Two Times A Day 12)  Wavesense Presto Test  Strp (Glucose Blood) .... Two Times A Day Blood Sugar Checks 13)  Wavesense Ultra-Thin Lancets  Misc (Lancets) .... Two Times A Day Blood Sugar Checks  Allergies (verified): No Known Drug Allergies  Physical Exam  General:  alert, well-developed, and well-nourished.   Head:  normocephalic and atraumatic.   Msk:  no cervical spinal tenderness to palpation Tenderness noted over the inferior portion of the scapula with muscle  tightness noted as well Empty can test slightly positive on the left full range of motion of bilateral upper extremities No tenderness noted of the biceps tendon, a.c. joint or coracoclavicular joint Neurologic:  alert & oriented X3 and cranial nerves II-XII intact.  biceps, brachioradialis and triceps deep tendon reflexes 2+ bilaterally  Psych:  normally interactive.     Impression & Recommendations:  Problem # 1:  SHOULDER STRAIN, LEFT (ICD-840.9)  ? infraspinatus strain continue with nsaids and muscle relaxers offered tramadol but she declined check xrays of neck and shoulder if no better in one week or worse will need to see back . . . consider MRI (?[facility] to [facility])  Orders: Diagnostic X-Ray/Fluoroscopy (Diagnostic X-Ray/Flu)  Complete Medication List: 1)  Metformin Hcl 500 Mg Tabs (Metformin hcl) .Marland Kitchen..  1 tab po daily 2)  Adult Aspirin Ec Low Strength 81 Mg Tbec (Aspirin) .Marland Kitchen.. 1 tab by mouth daily 3)  Norvasc 10 Mg Tabs (Amlodipine besylate) .Marland Kitchen.. 1 tab po daily 4)  Benazepril Hcl 20 Mg Tabs (Benazepril hcl) .Marland Kitchen.. 1 tab po daily 5)  Lipitor 10 Mg Tabs (Atorvastatin calcium) .Marland Kitchen.. 1 tab by mouth daily 6)  Diovan 160 Mg Tabs (Valsartan) .Marland Kitchen.. 1 tab by mouth daily 7)  Hydrochlorothiazide 25 Mg Tabs (Hydrochlorothiazide) .Marland Kitchen.. 1 tab by mouth daily 8)  Metoprolol Tartrate 25 Mg Tabs (Metoprolol tartrate) .Marland Kitchen.. 1 by mouth two times a day 9)  Metaxalone 800 Mg Tabs (Metaxalone) .Marland Kitchen.. 1 tab by mouth every 8 hours as needed for shoulder pain--ed visit 10)  Katheren Puller Presto W/device Kit (Blood glucose monitoring suppl) .... Use to check sugars two times a day 11)  Wavesense Presto Test Strp (Glucose blood) .... Two times a day blood sugar checks 12)  Wavesense Ultra-thin Lancets Misc (Lancets) .... Two times a day blood sugar checks 13)  Ibuprofen 800 Mg Tabs (Ibuprofen) .... Take 1 tablet by mouth three times a day as needed  Patient Instructions: 1)  Take ibuprofen with food. 2)  Take muscle relaxers as needed. 3)  Continue to use heat. 4)  Return in one week if no better or worse. Prescriptions: IBUPROFEN 800 MG TABS (IBUPROFEN) Take 1 tablet by mouth three times a day as needed  #90 x 1   Entered and Authorized by:   Tereso Newcomer PA-C   Signed by:   Tereso Newcomer PA-C on 03/16/2009   Method used:   Print then Give to Patient   RxID:   [id number]   Laboratory Results   Blood Tests   Date/Time Received: March 16, 2009 12:29 PM   HGBA1C: 6.2%   (Normal Range: Non-Diabetic - 3-6%   Control Diabetic - 6-8%) CBG Random:: 221mg /dL

## 2010-02-21 NOTE — Progress Notes (Signed)
Summary: [facility]  Phone Note Call from Patient   Summary of Call: The pt medicaid card expire but she still has her orange card updated so the needs [facility] [phone] so she can start using the [facility]. Pt called at[facility] there to transfer the medications but [facility] told [facility] that [facility] needs to do it for her.  Pt still has [facility]ills [facility] the [facility] but she cannot afford to pay what it cost so therefore she needs [place] to call at there to transfer to [facility] Chiyoko Torrico MD Initial call taken by: Manon Hilding,  August 10, 2009 12:11 PM  Follow-up for Phone Call        [facility] being [facility] to [facility]. Pt. notified of transfer. Follow-up by: Dutch Quint RN,  August 10, 2009 2:26 PM

## 2010-02-21 NOTE — Progress Notes (Signed)
Summary: CHECKING ON NEUROLOGY REF  Phone Note Call from Patient Call back at Home Phone [phone]   Reason for Call: Referral Summary of Call: Lori Popowski PT. MS Storey CALLED TO LET us KNOW THAT SHE STILL HASN'T HEARD ANYTHING ABOUT HER NEUROLOGY REFERRAL Initial call taken by: Leodis Rains,  June 29, 2009 11:58 AM  Follow-up for Phone Call        Referral reprinted will give to Arna Medici. Follow-up by: Vesta Mixer CMA,  June 29, 2009 12:44 PM

## 2010-02-21 NOTE — Letter (Signed)
Summary: Out of Work  [facility]  [address] [place], [place] [address]   Phone: [phone]  Fax: [phone]    March 25, 2009   Employee:  MALEIYAH RELEFORD [facility] [employer]    To Whom It May Concern:   For Medical reasons, please excuse the above named employee from work for the following dates:  Start:   03/23/2009  End:   04/01/2009  If you need additional information, please feel free to contact our office.         Sincerely,    Tereso Newcomer PA-C

## 2010-02-21 NOTE — Progress Notes (Signed)
  Phone Note Outgoing Call   Summary of Call: Rene Kocher, Is there any way to fix this?  I used to work at the [employer] for [employer] before coming to [employer].  I keep getting refill requests sent to [facility] on our patients. Initial call taken by: Brynda Rim,  February 17, 2009 9:31 PM  Follow-up for Phone Call        I have contacted the emr team.. hopefully we can resolve the situation. Follow-up by: Mikey College CMA,  March 08, 2009 12:30 PM  Additional Follow-up for Phone Call Additional follow up Details #1::        Thanks Additional Follow-up by: Tereso Newcomer PA-C,  March 08, 2009 6:01 PM

## 2010-02-21 NOTE — Progress Notes (Signed)
  Phone Note Call from Patient Call back at [facility] Phone [phone] Call back at [phone]   Summary of Call: The pt feels that her thumb finger from her left hand is numb and she needs the provider to referral her somewhere to know what is going on with her.  She wants to go somewhere that keep her out of work.   Since last sunday, the pt hurt her shoulder and she went to the ER for this last time and now her finger is numb.   Teddie Mehta MD Initial call taken by: Manon Hilding,  March 08, 2009 3:19 PM  Follow-up for Phone Call        The pt still has not receiving any phone call back and she is wondering if the someone can call her back.  Pt finger is very numb and since Monday she had been without working.  Manon Hilding  March 10, 2009 10:43 AM  Additional Follow-up for Phone Call Additional follow up Details #1::        Left message on answering machine for pt to return call at [phone]. Additional Follow-up by: Vesta Mixer CMA,  March 10, 2009 11:50 AM    Additional Follow-up for Phone Call Additional follow up Details #2::    Pt has appt for 03/15/09 Follow-up by: Vesta Mixer CMA,  March 14, 2009 4:46 PM

## 2010-02-21 NOTE — Letter (Signed)
Summary: MED/SOLUTIONS//DENIAL  MED/SOLUTIONS//DENIAL   Imported By: Arta Bruce 06/23/2009 11:40:42  _____________________________________________________________________  External Attachment:    Type:   Image     Comment:   External Document

## 2010-02-21 NOTE — Letter (Signed)
Summary: Out of Work  [facility]  [address] [place], [place] [address]   Phone: [phone]  Fax: [phone]    March 16, 2009   Employee:  [name] [name] [facility] [employer] [facility]    To Whom It May Concern:   For Medical reasons, please excuse the above named employee from work for the following dates:  Start:   03/16/2009  End:   03/23/2009  If you need additional information, please feel free to contact our office.         Sincerely,    Tereso Newcomer PA-C

## 2010-02-21 NOTE — Letter (Signed)
Summary: *Referral Letter  [facility]  [address] [place], [place] [address]   Phone: [phone]  Fax: [phone]    06/16/2009   Thank you in advance for agreeing to see my patient:  [name] [name] [address] [place], [place]  [address]  Phone: [phone]  Reason for Referral: Pt. has been referred to Neurology twice since 2007.  Did not go in 2007.  She cannot recall if she actually met with Dr. Vickey Huger as planned in 2009.  She has had at least 2 episodes of optic neuritis, the first on right in 2007, the second on the left on May 13.  She is also being followed by Dr. Dione Booze as well.  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  Currently on prednisone burst and taper for the eye involvement.  Please see ED visit dated 06/03/09 for MRI results.  No other definite signs or symptoms.  Procedures Requested: Evaluation, treatment and follow up of MS  Current Medical Problems: 1)  CARPAL TUNNEL SYNDROME, LEFT (ICD-354.0) 2)  SHOULDER STRAIN, LEFT (ICD-840.9) 3)  ENCOUNTER FOR LONG-TERM USE OF OTHER MEDICATIONS (ICD-V58.69) 4)  MULTIPLE SCLEROSIS (ICD-340) 5)  DYSLIPIDEMIA (ICD-272.4) 6)  TOBACCO USER (ICD-305.1) 7)  HYPERTENSION (ICD-401.9) 8)  DIABETES MELLITUS, TYPE II (ICD-250.00)   Current Medications: 1)  METFORMIN HCL 500 MG  TABS (METFORMIN HCL) 1 tab po daily 2)  ADULT ASPIRIN EC LOW STRENGTH 81 MG  TBEC (ASPIRIN) 1 tab by mouth daily 3)  AMLODIPINE BESYLATE 10 MG TABS (AMLODIPINE BESYLATE) 1 tab by mouth daily 4)  BENAZEPRIL HCL 20 MG  TABS (BENAZEPRIL HCL) 1 tab po daily 5)  SIMVASTATIN 20 MG TABS (SIMVASTATIN) 1 tab by mouth daily 6)  HYDROCHLOROTHIAZIDE 25 MG  TABS (HYDROCHLOROTHIAZIDE) 1 tab by mouth daily 7)  METOPROLOL TARTRATE 25 MG TABS (METOPROLOL TARTRATE) 1 by mouth two times a day 8)  METAXALONE 800 MG TABS (METAXALONE) 1 tab by mouth every 8 hours as needed for shoulder pain--ED visit 9)  WAVESENSE  PRESTO W/DEVICE KIT (BLOOD GLUCOSE MONITORING SUPPL) use to check sugars two times a day 10)  WAVESENSE PRESTO TEST  STRP (GLUCOSE BLOOD) two times a day blood sugar checks 11)  WAVESENSE ULTRA-THIN LANCETS  MISC (LANCETS) two times a day blood sugar checks 12)  IBUPROFEN 800 MG TABS (IBUPROFEN) Take 1 tablet by mouth three times a day as needed 13)  ATIVAN 1 MG TABS (LORAZEPAM) 1 tab by mouth 1/2 hour prior to MRI 14)  PREDNISONE 10 MG TABS (PREDNISONE) taper 15)  LOSARTAN POTASSIUM 50 MG TABS (LOSARTAN POTASSIUM) 1 tab by mouth daily   Past Medical History: 1)  PROBABLE MULTIPLE SCLEROSIS (ICD-340) 2)  DYSLIPIDEMIA (ICD-272.4) 3)  TOBACCO USER (ICD-305.1) 4)  HYPERTENSION (ICD-401.9) 5)  DIABETES MELLITUS, TYPE II (ICD-250.00)     Thank you again for agreeing to see our patient; please contact us if you have any further questions or need additional information.  Sincerely,  Julieanne Manson MD

## 2010-02-21 NOTE — Letter (Signed)
Summary: MED/SOLUTIONS//DENIAL  MED/SOLUTIONS//DENIAL   Imported By: Arta Bruce 06/23/2009 11:39:15  _____________________________________________________________________  External Attachment:    Type:   Image     Comment:   External Document

## 2010-02-21 NOTE — Assessment & Plan Note (Signed)
Summary: 3 MONTH FU ON HTN //KT   Vital Signs:  Patient profile:   40 year old female Menstrual status:  regular Weight:      184 pounds Temp:     98.8 degrees F Pulse rate:   89 / minute Pulse rhythm:   regular Resp:     20 per minute BP sitting:   132 / 85  (left arm) Cuff size:   regular  Vitals Entered By: Vesta Mixer CMA (March 03, 2009 2:54 PM) CC: f/u htn, hurt arm at work went to ED and given meds, but wants to know from you if ok to take. Is Patient Diabetic? Yes Pain Assessment Patient in pain? yes     Location: left shoulder/arm Intensity: 7 CBG Result 125  Does patient need assistance? Ambulation Normal   CC:  f/u htn, hurt arm at work went to ED and given meds, and but wants to know from you if ok to take.Marland Kitchen  History of Present Illness: 1.  Left shoulder strain:  Has Diclofenac and Metaxalone.  Has not started either.  2.  Hypertension: Has been taking meds regularly.  No exercise.  Is watching what she eats.  3.  DM:  Metformin--taking only 500 mg daily.  Not checking sugars--needs another meter.  No polydipsia or polyuria.  Has lost 8 lbs since last visit.  4.  Hyperlipidemia:  pt. did not realize she was to restart medication for this with last visit--has the med, just did not restart. Lipitor.  Allergies (verified): No Known Drug Allergies  Physical Exam  Lungs:  Normal respiratory effort, chest expands symmetrically. Lungs are clear to auscultation, no crackles or wheezes. Heart:  Normal rate and regular rhythm. S1 and S2 normal without gallop, murmur, click, rub or other extra sounds.  Radial pulses normal and equal.   Impression & Recommendations:  Problem # 1:  SHOULDER STRAIN, LEFT (ICD-840.9) To start meds from ED--particularly Diclofenac.   To use muscle relaxant at bedtime as needed   Problem # 2:  DYSLIPIDEMIA (ICD-272.4) Restart Lipitor Her updated medication list for this problem includes:    Lipitor 10 Mg Tabs (Atorvastatin  calcium) .Marland Kitchen... 1 tab by mouth daily  Problem # 3:  HYPERTENSION (ICD-401.9) Controlled Her updated medication list for this problem includes:    Norvasc 10 Mg Tabs (Amlodipine besylate) .Marland Kitchen... 1 tab po daily    Benazepril Hcl 20 Mg Tabs (Benazepril hcl) .Marland Kitchen... 1 tab po daily    Diovan 160 Mg Tabs (Valsartan) .Marland Kitchen... 1 tab by mouth daily    Hydrochlorothiazide 25 Mg Tabs (Hydrochlorothiazide) .Marland Kitchen... 1 tab by mouth daily    Metoprolol Tartrate 25 Mg Tabs (Metoprolol tartrate) .Marland Kitchen... 1 by mouth two times a day  Problem # 4:  DIABETES MELLITUS, TYPE II (ICD-250.00) Rx for glucometer sent Flu vaccine today Her updated medication list for this problem includes:    Metformin Hcl 500 Mg Tabs (Metformin hcl) .Marland Kitchen... 1 tab po daily    Adult Aspirin Ec Low Strength 81 Mg Tbec (Aspirin) .Marland Kitchen... 1 tab by mouth daily    Benazepril Hcl 20 Mg Tabs (Benazepril hcl) .Marland Kitchen... 1 tab po daily    Diovan 160 Mg Tabs (Valsartan) .Marland Kitchen... 1 tab by mouth daily  Orders: Capillary Blood Glucose/CBG (33295) T- Hemoglobin A1C ([address])  Complete Medication List: 1)  Metformin Hcl 500 Mg Tabs (Metformin hcl) .Marland Kitchen.. 1 tab po daily 2)  Adult Aspirin Ec Low Strength 81 Mg Tbec (Aspirin) .Marland Kitchen.. 1 tab by mouth daily  3)  Norvasc 10 Mg Tabs (Amlodipine besylate) .Marland Kitchen.. 1 tab po daily 4)  Benazepril Hcl 20 Mg Tabs (Benazepril hcl) .Marland Kitchen.. 1 tab po daily 5)  Lipitor 10 Mg Tabs (Atorvastatin calcium) .Marland Kitchen.. 1 tab by mouth daily 6)  Diovan 160 Mg Tabs (Valsartan) .Marland Kitchen.. 1 tab by mouth daily 7)  Hydrochlorothiazide 25 Mg Tabs (Hydrochlorothiazide) .Marland Kitchen.. 1 tab by mouth daily 8)  Metoprolol Tartrate 25 Mg Tabs (Metoprolol tartrate) .Marland Kitchen.. 1 by mouth two times a day 9)  Diclofenac Sodium 75 Mg Tbec (Diclofenac sodium) .Marland Kitchen.. 1 tab by mouth two times a day--from ed 10)  Metaxalone 800 Mg Tabs (Metaxalone) .Marland Kitchen.. 1 tab by mouth every 8 hours as needed for shoulder pain--ed visit 11)  Katheren Puller Presto W/device Kit (Blood glucose monitoring suppl) .... Use to  check sugars two times a day 12)  Wavesense Presto Test Strp (Glucose blood) .... Two times a day blood sugar checks 13)  Wavesense Ultra-thin Lancets Misc (Lancets) .... Two times a day blood sugar checks  Other Orders: Flu Vaccine 58yrs + ([address]) Admin 1st Vaccine (53664) Admin 1st Vaccine [facility]) [phone])  Patient Instructions: 1)  Get started on Lipitor 2)  Schedule fasting labs in 2 months--FLP, CMET 3)  Follow up with Dr. Delrae Alfred in 4 months --DM, htn, cholesterol Prescriptions: WAVESENSE ULTRA-THIN LANCETS  MISC (LANCETS) two times a day blood sugar checks  #100 x 11   Entered and Authorized by:   Julieanne Manson MD   Signed by:   Julieanne Manson MD on 03/03/2009   Method used:   Faxed to ...       [facility] (retail)       [address] [place], [place]  [address]       Ph: [phone]       Fax: [phone]   RxID:   [id number] WAVESENSE PRESTO TEST  STRP (GLUCOSE BLOOD) two times a day blood sugar checks  #100 x 11   Entered and Authorized by:   Julieanne Manson MD   Signed by:   Julieanne Manson MD on 03/03/2009   Method used:   Faxed to ...       [facility] (retail)       [address] [place], [place]  [address]       Ph: [phone]       Fax: [phone]   RxID:   [id number] WAVESENSE PRESTO W/DEVICE KIT (BLOOD GLUCOSE MONITORING SUPPL) use to check sugars two times a day  #1 x 0   Entered and Authorized by:   Julieanne Manson MD   Signed by:   Julieanne Manson MD on 03/03/2009   Method used:   Faxed to ...       [facility] (retail)       [address] [place], [place]  [address]       Ph: [phone]       Fax: [phone]   RxID:   [id number]    Tetanus/Td Immunization History:    Tetanus/Td # 1:  Historical (08/22/2005)  Influenza Immunization History:    Influenza # 1:   Fluvax 3+ (03/03/2009)  Pneumovax Immunization History:    Pneumovax # 1:  Historical (09/24/2005)  Influenza Vaccine    Vaccine Type: Fluvax 3+    Site: right deltoid    Mfr:  Sanofi Pasteur    Dose: 0.5 ml    Route: IM    Given by: Armenia Shannon    Exp. Date: 07/21/2009    Lot #: [id number]    VIS given: 08/15/06 version given March 03, 2009.  Flu Vaccine Consent Questions    Do you have a history of severe allergic reactions to this vaccine? no    Any prior history of allergic reactions to egg and/or gelatin? no    Do you have a sensitivity to the preservative Thimersol? no    Do you have a past history of Guillan-Barre Syndrome? no    Do you currently have an acute febrile illness? no    Have you ever had a severe reaction to latex? no    Vaccine information given and explained to patient? yes    Are you currently pregnant? no

## 2010-02-21 NOTE — Progress Notes (Signed)
  Phone Note Outgoing Call   Summary of Call: [name] [name]--neuro referral to Dr. Vickey Huger and letter Initial call taken by: Julieanne Manson MD,  Jun 16, 2009 3:32 PM

## 2010-02-21 NOTE — Letter (Signed)
Summary: [facility]   Imported By: Arta Bruce 09/09/2009 14:47:45  _____________________________________________________________________  External Attachment:    Type:   Image     Comment:   External Document

## 2010-02-21 NOTE — Letter (Signed)
Summary: *HSN Results Follow up  [facility]  [address] [place], [place] [address]   Phone: [phone]  Fax: [phone]      09/29/2009   [name] [name] [facility] [address] [place], [place]  [address]   Dear  Ms. [name] [name],                            ____S.Drinkard,FNP   ____D. Gore,FNP       ____B. McPherson,MD   ____V. Rankins,MD    ____E. Mulberry,MD    ____N. Daphine Deutscher, FNP  ____D. Reche Dixon, MD    ____K. Philipp Deputy, MD    ____Other     This letter is to inform you that your recent test(s):  _______Pap Smear    _______Lab Test     _______X-ray    _______ is within acceptable limits  _______ requires a medication change  _______ requires a follow-up lab visit  _______ requires a follow-up visit with your provider   Comments: MS Bohnsack I BEING TRYING TO CALL YOU BUT YOUR # DISC                     PLEASE CALL OUR OFFICE IS ABOUT YOUR REFERRAL TO THE                     NEUROLOGY THANK YOU.       _________________________________________________________ If you have any questions, please contact our office                     Sincerely,  Cheryll Dessert [facility]

## 2010-02-23 NOTE — Assessment & Plan Note (Signed)
Summary: DIZZINESS///KT   Vital Signs:  Patient profile:   40 year old female Menstrual status:  regular LMP:     01/21/2010 Weight:      167.31 pounds BMI:     31.73 Temp:     97.2 degrees F oral Pulse rate:   96 / minute Pulse rhythm:   regular Resp:     16 per minute BP sitting:   144 / 110  (left arm) Cuff size:   regular  Vitals Entered By: Hale Drone CMA (January 27, 2010 8:42 AM) CC: Hosp. f/u. Pt. felt dizzy and was diagnosed w/vertigo.  CBG Result 113 CBG Device ID B fasting LMP (date): 01/21/2010 LMP - Character: normal     Enter LMP: 01/21/2010 Last PAP Result Done   CC:  Hosp. f/u. Pt. felt dizzy and was diagnosed w/vertigo. Marland Kitchen  History of Present Illness: 1.  MS:  pt. still working on Medicaid--we were trying to get her in somewhere locally for Neurology.  Pt. states has lost 2 brothers 6 days apart.  One from an MI and another from renal failure.  Also, found another brother she did not know about previously.  He died of an MI as well.  Because of family issues, has not progressed with attempts to apply for Medicaid.  CT of head done for vertigo in ED 12/10/09 showed increased white matter disease of brain.    2.  Vertigo:  Went to ED on date above for dizziness.  Symptoms started 1 week before ED visit.  Feels like the room spinning.  Associated nausea.  Pt. noted then and now only when looking to left.  Pt. states her symptoms are much better than what they were.  Still having brief episodes after looking to left on and off every day.  No longer having nausea and eating fine.  Feels like she will still struggle with work with the dizziness.    Current Medications (verified): 1)  Metformin Hcl 500 Mg  Tabs (Metformin Hcl) .Marland Kitchen.. 1 Tab Po Daily 2)  Adult Aspirin Ec Low Strength 81 Mg  Tbec (Aspirin) .Marland Kitchen.. 1 Tab By Mouth Daily 3)  Amlodipine Besylate 10 Mg Tabs (Amlodipine Besylate) .Marland Kitchen.. 1 Tab By Mouth Daily 4)  Benazepril Hcl 20 Mg  Tabs (Benazepril Hcl) .Marland Kitchen.. 1 Tab  Po Daily 5)  Lipitor 10 Mg Tabs (Atorvastatin Calcium) .Marland Kitchen.. 1 Tab By Mouth Daily 6)  Hydrochlorothiazide 25 Mg  Tabs (Hydrochlorothiazide) .Marland Kitchen.. 1 Tab By Mouth Daily 7)  Metoprolol Tartrate 25 Mg Tabs (Metoprolol Tartrate) .Marland Kitchen.. 1 By Mouth Two Times A Day 8)  Metaxalone 800 Mg Tabs (Metaxalone) .Marland Kitchen.. 1 Tab By Mouth Every 8 Hours As Needed For Shoulder Pain--Ed Visit 9)  Wavesense Presto W/device Kit (Blood Glucose Monitoring Suppl) .... Use To Check Sugars Two Times A Day 10)  Wavesense Presto Test  Strp (Glucose Blood) .... Two Times A Day Blood Sugar Checks 11)  Wavesense Ultra-Thin Lancets  Misc (Lancets) .... Two Times A Day Blood Sugar Checks 12)  Ibuprofen 800 Mg Tabs (Ibuprofen) .... Take 1 Tablet By Mouth Three Times A Day As Needed 13)  Ativan 1 Mg Tabs (Lorazepam) .Marland Kitchen.. 1 Tab By Mouth 1/2 Hour Prior To Mri 14)  Prednisone 10 Mg Tabs (Prednisone) .... Taper 15)  Losartan Potassium 50 Mg Tabs (Losartan Potassium) .Marland Kitchen.. 1 Tab By Mouth Daily  Allergies (verified): No Known Drug Allergies  Family History: Mother, died 68:  Stroke, hypertension, gout. Father, died 30:  MI,  hypertension, DM. Brother, died 28:  Renal failure. Hypertension, HIV. Sister, died 57:  ?fatty liver/cirrhosis, died suddenly in church, [place]. Brother, died 55:  MI, hypertension, DM. 1/2 Brother, died 68:  MI. Son, 38:  Healthy Son, 5:  Healthy  Social History: Unemployed since November 2011 when dizziness started. 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  Smokes 6 cigarettes daily Alcohol:  None currently, never a problem Drug Use:  never.  Physical Exam  General:  Shivering as feels cold in office Eyes:  PERRL, EOMI.   Ears:  TMs clear Mouth:  pharynx pink and moist.   Neck:  No deformities, masses, or tenderness noted. Neurologic:  alert & oriented X3, cranial nerves II-XII intact, strength normal in all extremities, gait normal, DTRs  symmetrical and normal, finger-to-nose normal, and Romberg negative.     Impression & Recommendations:  Problem # 1:  BENIGN POSITIONAL VERTIGO (ICD-386.11) Sounds like BPV, but concern that may also be related to MS. Significantly improved with time. Pt. denies any other neurologic symptoms, despite what appears to be increased white matter disease on CT recently.   Send for vestibular PT  Orders: Physical Therapy Referral (PT)  Problem # 2:  MULTIPLE SCLEROSIS (ICD-340)  Orders: Neurology Referral (Neuro)  [facility]ing [facility] as does not sound like application for Medicaid is progressing  Complete Medication List: 1)  Metformin Hcl 500 Mg Tabs (Metformin hcl) .Marland Kitchen.. 1 tab po daily 2)  Adult Aspirin Ec Low Strength 81 Mg Tbec (Aspirin) .Marland Kitchen.. 1 tab by mouth daily 3)  Amlodipine Besylate 10 Mg Tabs (Amlodipine besylate) .Marland Kitchen.. 1 tab by mouth daily 4)  Benazepril Hcl 20 Mg Tabs (Benazepril hcl) .Marland Kitchen.. 1 tab po daily 5)  Lipitor 10 Mg Tabs (Atorvastatin calcium) .Marland Kitchen.. 1 tab by mouth daily 6)  Hydrochlorothiazide 25 Mg Tabs (Hydrochlorothiazide) .Marland Kitchen.. 1 tab by mouth daily 7)  Metoprolol Tartrate 25 Mg Tabs (Metoprolol tartrate) .Marland Kitchen.. 1 by mouth two times a day 8)  Metaxalone 800 Mg Tabs (Metaxalone) .Marland Kitchen.. 1 tab by mouth every 8 hours as needed for shoulder pain--ed visit 9)  Katheren Puller Presto W/device Kit (Blood glucose monitoring suppl) .... Use to check sugars two times a day 10)  Wavesense Presto Test Strp (Glucose blood) .... Two times a day blood sugar checks 11)  Wavesense Ultra-thin Lancets Misc (Lancets) .... Two times a day blood sugar checks 12)  Ibuprofen 800 Mg Tabs (Ibuprofen) .... Take 1 tablet by mouth three times a day as needed 13)  Ativan 1 Mg Tabs (Lorazepam) .Marland Kitchen.. 1 tab by mouth 1/2 hour prior to mri 14)  Prednisone 10 Mg Tabs (Prednisone) .... Taper 15)  Losartan Potassium 50 Mg Tabs (Losartan potassium) .Marland Kitchen.. 1 tab by mouth daily  Other Orders: Flu Vaccine 15yrs + (09811) Admin 1st Vaccine  (91478)  Patient Instructions: 1)  Follow up with Dr. Delrae Alfred in 2 months for vertigo--follow up of PT   Orders Added: 1)  Flu Vaccine 5yrs + [90658] 2)  Admin 1st Vaccine [90471] 3)  Physical Therapy Referral [PT] 4)  Neurology Referral [Neuro] 5)  Est. Patient Level III [29562]   Immunizations Administered:  Influenza Vaccine # 1:    Vaccine Type: Fluvax 3+    Site: left deltoid    Mfr: GlaxoSmithKline    Dose: 0.5 ml    Route: IM    Given by: Hale Drone CMA    Exp. Date: 07/22/2010    Lot #:  [id number]    VIS given: 08/16/09 version given January 27, 2010.  Flu Vaccine Consent Questions:    Do you have a history of severe allergic reactions to this vaccine? no    Any prior history of allergic reactions to egg and/or gelatin? no    Do you have a sensitivity to the preservative Thimersol? no    Do you have a past history of Guillan-Barre Syndrome? no    Do you currently have an acute febrile illness? no    Have you ever had a severe reaction to latex? no    Vaccine information given and explained to patient? yes    Are you currently pregnant? no   Immunizations Administered:  Influenza Vaccine # 1:    Vaccine Type: Fluvax 3+    Site: left deltoid    Mfr: GlaxoSmithKline    Dose: 0.5 ml    Route: IM    Given by: Hale Drone CMA    Exp. Date: 07/22/2010    Lot #: [id number]    VIS given: 08/16/09 version given January 27, 2010.

## 2010-02-23 NOTE — Letter (Signed)
Summary: WORK /SCHOOL EXCUSE  WORK /SCHOOL EXCUSE   Imported By: Arta Bruce 01/30/2010 10:37:18  _____________________________________________________________________  External Attachment:    Type:   Image     Comment:   External Document

## 2010-02-23 NOTE — Letter (Signed)
Summary: *Referral Letter  [facility]  [address] [place], [place] [address]   Phone: [phone]  Fax: [phone]    01/27/2010  Thank you in advance for agreeing to see my patient:  [name] [name] [address] [place], [place]  [address]  Phone: [phone]  Reason for Referral: Pt. with MRI and CT of brain with demyelinating disease and diagnosis of MS previously given.  Findings at least since 2007.  Has suffered optic neuritis I believe with 3 exacerbations.  Followed previously by Dr. Dione Booze, Ophthalmology in [place] for this previously.  She has not been able to afford local neurology follow and care.  Recently with bout of vertigo that is improving without treatment.  Most consistent with BPV, but not clear if related to MS as well.  CT of brain recently for vertigo complaint showed increased white matter disease.  Sending for vestibular PT, though symptoms of vertigo much improved and unable to elicit with provocative maneuvers in office today.  Procedures Requested: Evaluate and treat.  Current Medical Problems: 1)  BENIGN POSITIONAL VERTIGO (ICD-386.11) 2)  CARPAL TUNNEL SYNDROME, LEFT (ICD-354.0) 3)  SHOULDER STRAIN, LEFT (ICD-840.9) 4)  ENCOUNTER FOR LONG-TERM USE OF OTHER MEDICATIONS (ICD-V58.69) 5)  MULTIPLE SCLEROSIS (ICD-340) 6)  DYSLIPIDEMIA (ICD-272.4) 7)  TOBACCO USER (ICD-305.1) 8)  HYPERTENSION (ICD-401.9) 9)  DIABETES MELLITUS, TYPE II (ICD-250.00)   Current Medications: 1)  METFORMIN HCL 500 MG  TABS (METFORMIN HCL) 1 tab po daily 2)  ADULT ASPIRIN EC LOW STRENGTH 81 MG  TBEC (ASPIRIN) 1 tab by mouth daily 3)  AMLODIPINE BESYLATE 10 MG TABS (AMLODIPINE BESYLATE) 1 tab by mouth daily 4)  BENAZEPRIL HCL 20 MG  TABS (BENAZEPRIL HCL) 1 tab po daily 5)  LIPITOR 10 MG TABS (ATORVASTATIN CALCIUM) 1 tab by mouth daily 6)  HYDROCHLOROTHIAZIDE 25 MG  TABS (HYDROCHLOROTHIAZIDE) 1 tab by mouth daily 7)  METOPROLOL  TARTRATE 25 MG TABS (METOPROLOL TARTRATE) 1 by mouth two times a day 8)  METAXALONE 800 MG TABS (METAXALONE) 1 tab by mouth every 8 hours as needed for shoulder pain--ED visit 9)  WAVESENSE PRESTO W/DEVICE KIT (BLOOD GLUCOSE MONITORING SUPPL) use to check sugars two times a day 10)  WAVESENSE PRESTO TEST  STRP (GLUCOSE BLOOD) two times a day blood sugar checks 11)  WAVESENSE ULTRA-THIN LANCETS  MISC (LANCETS) two times a day blood sugar checks 12)  IBUPROFEN 800 MG TABS (IBUPROFEN) Take 1 tablet by mouth three times a day as needed 13)  ATIVAN 1 MG TABS (LORAZEPAM) 1 tab by mouth 1/2 hour prior to MRI 14)  PREDNISONE 10 MG TABS (PREDNISONE) taper 15)  LOSARTAN POTASSIUM 50 MG TABS (LOSARTAN POTASSIUM) 1 tab by mouth daily   Past Medical History: 1)  PROBABLE MULTIPLE SCLEROSIS (ICD-340) 2)  DYSLIPIDEMIA (ICD-272.4) 3)  TOBACCO USER (ICD-305.1) 4)  HYPERTENSION (ICD-401.9) 5)  DIABETES MELLITUS, TYPE II (ICD-250.00)   Prior History of Blood Transfusions:   Pertinent Labs:    Thank you again for agreeing to see our patient; please contact us if you have any further questions or need additional information.  Sincerely,  Julieanne Manson MD

## 2010-02-23 NOTE — Progress Notes (Signed)
Summary: Vestibular PT and Neurology referrals.  Phone Note Outgoing Call   Summary of Call: NOra--vestibular PT and [facility] neurology--pt. has not obtained Medicaid as of yet.  Letter to Neurology Initial call taken by: Julieanne Manson MD,  January 27, 2010 9:36 AM  Follow-up for Phone Call        I CALL [facility] AND I SCHEDULE HER AN APPT 04-12-10 @ 8:45AM LVM TO PT TO CALL ME BACK  Follow-up by: Cheryll Dessert,  January 27, 2010 12:30 PM

## 2010-03-14 ENCOUNTER — Inpatient Hospital Stay (HOSPITAL_COMMUNITY)
Admission: EM | Admit: 2010-03-14 | Discharge: 2010-03-21 | DRG: 060 | Disposition: A | Discharge status: Home Health | Payer: Medicaid Other | Attending: Internal Medicine | Admitting: Internal Medicine

## 2010-03-14 ENCOUNTER — Telehealth (INDEPENDENT_AMBULATORY_CARE_PROVIDER_SITE_OTHER): Payer: Self-pay | Admitting: Internal Medicine

## 2010-03-14 DIAGNOSIS — E876 Hypokalemia: Secondary | ICD-10-CM | POA: Diagnosis present

## 2010-03-14 DIAGNOSIS — IMO0002 Reserved for concepts with insufficient information to code with codable children: Secondary | ICD-10-CM

## 2010-03-14 DIAGNOSIS — G35 Multiple sclerosis: Principal | ICD-10-CM | POA: Diagnosis present

## 2010-03-14 DIAGNOSIS — Z5987 Material hardship due to limited financial resources, not elsewhere classified: Secondary | ICD-10-CM

## 2010-03-14 DIAGNOSIS — Z8249 Family history of ischemic heart disease and other diseases of the circulatory system: Secondary | ICD-10-CM

## 2010-03-14 DIAGNOSIS — E119 Type 2 diabetes mellitus without complications: Secondary | ICD-10-CM | POA: Diagnosis present

## 2010-03-14 DIAGNOSIS — Z598 Other problems related to housing and economic circumstances: Secondary | ICD-10-CM

## 2010-03-14 DIAGNOSIS — F17200 Nicotine dependence, unspecified, uncomplicated: Secondary | ICD-10-CM | POA: Diagnosis present

## 2010-03-14 DIAGNOSIS — Z833 Family history of diabetes mellitus: Secondary | ICD-10-CM

## 2010-03-14 DIAGNOSIS — Z7982 Long term (current) use of aspirin: Secondary | ICD-10-CM

## 2010-03-14 DIAGNOSIS — I10 Essential (primary) hypertension: Secondary | ICD-10-CM | POA: Diagnosis present

## 2010-03-15 ENCOUNTER — Emergency Department (HOSPITAL_COMMUNITY): Payer: Medicaid Other

## 2010-03-15 ENCOUNTER — Inpatient Hospital Stay (HOSPITAL_COMMUNITY): Payer: Medicaid Other

## 2010-03-15 DIAGNOSIS — R5381 Other malaise: Secondary | ICD-10-CM

## 2010-03-15 DIAGNOSIS — R5383 Other fatigue: Secondary | ICD-10-CM

## 2010-03-15 DIAGNOSIS — Z9181 History of falling: Secondary | ICD-10-CM

## 2010-03-15 LAB — BASIC METABOLIC PANEL WITH GFR
BUN: 9 mg/dL (ref 6–23)
CO2: 24 meq/L (ref 19–32)
Calcium: 9.3 mg/dL (ref 8.4–10.5)
Chloride: 102 meq/L (ref 96–112)
Creatinine, Ser: 0.74 mg/dL (ref 0.4–1.2)
GFR calc non Af Amer: >60 mL/min
Glucose, Bld: 110 mg/dL — ABNORMAL HIGH (ref 70–99)
Potassium: 3 meq/L — ABNORMAL LOW (ref 3.5–5.1)
Sodium: 137 meq/L (ref 135–145)

## 2010-03-15 LAB — DIFFERENTIAL
Basophils Absolute: 0 K/uL (ref 0.0–0.1)
Basophils Relative: 0 % (ref 0–1)
Eosinophils Absolute: 0 K/uL (ref 0.0–0.7)
Eosinophils Relative: 0 % (ref 0–5)
Lymphocytes Relative: 21 % (ref 12–46)
Lymphs Abs: 2 K/uL (ref 0.7–4.0)
Monocytes Absolute: 0.6 K/uL (ref 0.1–1.0)
Monocytes Relative: 7 % (ref 3–12)
Neutro Abs: 7 K/uL (ref 1.7–7.7)
Neutrophils Relative %: 72 % (ref 43–77)

## 2010-03-15 LAB — URINALYSIS, ROUTINE W REFLEX MICROSCOPIC
Ketones, ur: 40 mg/dL — AB
Nitrite: NEGATIVE
Protein, ur: 30 mg/dL — AB
Specific Gravity, Urine: 1.026 (ref 1.005–1.030)
Urine Glucose, Fasting: NEGATIVE mg/dL
Urobilinogen, UA: 1 mg/dL (ref 0.0–1.0)
pH: 8 (ref 5.0–8.0)

## 2010-03-15 LAB — URINE MICROSCOPIC-ADD ON

## 2010-03-15 LAB — HEPATIC FUNCTION PANEL
ALT: 13 U/L (ref 0–35)
AST: 15 U/L (ref 0–37)
Albumin: 4 g/dL (ref 3.5–5.2)
Alkaline Phosphatase: 59 U/L (ref 39–117)
Total Bilirubin: 2.1 mg/dL — ABNORMAL HIGH (ref 0.3–1.2)

## 2010-03-15 LAB — CBC
HCT: 39.2 % (ref 36.0–46.0)
Hemoglobin: 13.8 g/dL (ref 12.0–15.0)
MCHC: 35.2 g/dL (ref 30.0–36.0)
MCV: 91.4 fL (ref 78.0–100.0)

## 2010-03-15 LAB — RAPID URINE DRUG SCREEN, HOSP PERFORMED
Amphetamines: NOT DETECTED
Benzodiazepines: NOT DETECTED
Cocaine: NOT DETECTED
Opiates: NOT DETECTED
Tetrahydrocannabinol: NOT DETECTED

## 2010-03-16 ENCOUNTER — Inpatient Hospital Stay (HOSPITAL_COMMUNITY): Payer: Medicaid Other

## 2010-03-16 LAB — CBC
HCT: 38.6 % (ref 36.0–46.0)
MCH: 33.3 pg (ref 26.0–34.0)
MCHC: 36.5 g/dL — ABNORMAL HIGH (ref 30.0–36.0)
MCV: 91.3 fL (ref 78.0–100.0)
RDW: 11.7 % (ref 11.5–15.5)
WBC: 5.9 10*3/uL (ref 4.0–10.5)

## 2010-03-16 LAB — LIPID PANEL
HDL: 67 mg/dL (ref >39)
Triglycerides: 37 mg/dL (ref <150)
VLDL: 7 mg/dL (ref 0–40)

## 2010-03-16 LAB — BASIC METABOLIC PANEL
CO2: 22 mEq/L (ref 19–32)
Calcium: 10 mg/dL (ref 8.4–10.5)
Glucose, Bld: 206 mg/dL — ABNORMAL HIGH (ref 70–99)
Sodium: 137 mEq/L (ref 135–145)

## 2010-03-16 LAB — RPR: RPR Ser Ql: NONREACTIVE

## 2010-03-16 LAB — GLUCOSE, CAPILLARY
Glucose-Capillary: 149 mg/dL — ABNORMAL HIGH (ref 70–99)
Glucose-Capillary: 198 mg/dL — ABNORMAL HIGH (ref 70–99)

## 2010-03-16 LAB — VITAMIN B12: Vitamin B-12: 416 pg/mL (ref 211–911)

## 2010-03-16 LAB — C-REACTIVE PROTEIN: CRP: 0.5 mg/dL — ABNORMAL LOW (ref <0.6)

## 2010-03-17 LAB — T4, FREE: Free T4: 1.36 ng/dL (ref 0.80–1.80)

## 2010-03-17 LAB — GLUCOSE, CAPILLARY
Glucose-Capillary: 177 mg/dL — ABNORMAL HIGH (ref 70–99)
Glucose-Capillary: 241 mg/dL — ABNORMAL HIGH (ref 70–99)

## 2010-03-17 LAB — SJOGRENS SYNDROME-B EXTRACTABLE NUCLEAR ANTIBODY: SSB (La) (ENA) Antibody, IgG: 13 AU/mL (ref <30)

## 2010-03-17 LAB — SJOGRENS SYNDROME-A EXTRACTABLE NUCLEAR ANTIBODY: SSA (Ro) (ENA) Antibody, IgG: 5 AU/mL (ref <30)

## 2010-03-18 ENCOUNTER — Inpatient Hospital Stay (HOSPITAL_COMMUNITY): Payer: Medicaid Other

## 2010-03-18 LAB — GLUCOSE, CAPILLARY
Glucose-Capillary: 181 mg/dL — ABNORMAL HIGH (ref 70–99)
Glucose-Capillary: 208 mg/dL — ABNORMAL HIGH (ref 70–99)
Glucose-Capillary: 211 mg/dL — ABNORMAL HIGH (ref 70–99)

## 2010-03-18 MED ORDER — GADOBENATE DIMEGLUMINE 529 MG/ML IV SOLN
15.0000 mL | Freq: Once | INTRAVENOUS | Status: AC
  Administered 2010-03-18: 15 mL via INTRAVENOUS

## 2010-03-19 DIAGNOSIS — R5383 Other fatigue: Secondary | ICD-10-CM

## 2010-03-19 DIAGNOSIS — R5381 Other malaise: Secondary | ICD-10-CM

## 2010-03-19 DIAGNOSIS — Z9181 History of falling: Secondary | ICD-10-CM

## 2010-03-19 LAB — BASIC METABOLIC PANEL
BUN: 16 mg/dL (ref 6–23)
CO2: 23 mEq/L (ref 19–32)
CO2: 25 mEq/L (ref 19–32)
Calcium: 8.8 mg/dL (ref 8.4–10.5)
Chloride: 99 mEq/L (ref 96–112)
Creatinine, Ser: 0.98 mg/dL (ref 0.4–1.2)
GFR calc Af Amer: >60 mL/min (ref >60)
GFR calc non Af Amer: >60 mL/min (ref >60)
Potassium: 3 mEq/L — ABNORMAL LOW (ref 3.5–5.1)
Sodium: 134 mEq/L — ABNORMAL LOW (ref 135–145)

## 2010-03-19 LAB — GLUCOSE, CAPILLARY
Glucose-Capillary: 226 mg/dL — ABNORMAL HIGH (ref 70–99)
Glucose-Capillary: 219 mg/dL — ABNORMAL HIGH (ref 70–99)

## 2010-03-19 LAB — MAGNESIUM: Magnesium: 2.7 mg/dL — ABNORMAL HIGH (ref 1.5–2.5)

## 2010-03-20 DIAGNOSIS — Z9181 History of falling: Secondary | ICD-10-CM

## 2010-03-20 DIAGNOSIS — R5381 Other malaise: Secondary | ICD-10-CM

## 2010-03-20 DIAGNOSIS — R5383 Other fatigue: Secondary | ICD-10-CM

## 2010-03-20 LAB — BASIC METABOLIC PANEL
BUN: 17 mg/dL (ref 6–23)
Calcium: 9.2 mg/dL (ref 8.4–10.5)
GFR calc non Af Amer: >60 mL/min (ref >60)
Glucose, Bld: 216 mg/dL — ABNORMAL HIGH (ref 70–99)
Sodium: 139 mEq/L (ref 135–145)

## 2010-03-20 LAB — GLUCOSE, CAPILLARY
Glucose-Capillary: 203 mg/dL — ABNORMAL HIGH (ref 70–99)
Glucose-Capillary: 216 mg/dL — ABNORMAL HIGH (ref 70–99)

## 2010-03-21 LAB — BASIC METABOLIC PANEL
GFR calc Af Amer: >60 mL/min (ref >60)
GFR calc non Af Amer: >60 mL/min (ref >60)
Glucose, Bld: 168 mg/dL — ABNORMAL HIGH (ref 70–99)
Potassium: 3.1 mEq/L — ABNORMAL LOW (ref 3.5–5.1)
Sodium: 138 mEq/L (ref 135–145)

## 2010-03-21 LAB — GLUCOSE, CAPILLARY
Glucose-Capillary: 153 mg/dL — ABNORMAL HIGH (ref 70–99)
Glucose-Capillary: 181 mg/dL — ABNORMAL HIGH (ref 70–99)

## 2010-03-21 NOTE — Progress Notes (Signed)
Summary: Sudden change in mobility  Phone Note Call from Patient Call back at [facility] Phone [phone]   Summary of Call: pt says her legs get weak that she is falling...Marland KitchenMarland Kitchen  pt says this has been going on for a couple of days now.... pt says she can't walk long distance without her legs feeling like they are about to give out on her... [facility] on [address] Initial call taken by: [place] Shannon,  March 14, 2010 11:28 AM  Follow-up for Phone Call        Leg weakness and giving out started suddenly.  Has been dizzy and nauseated from vertigo, ongoing problem.  Denies syncope, change in LOC.  Denies fever, recent illness.  If she moves around too much, gets nauseated and starts to vomit.  Not connected to leg weakness and falling.  Denies other symptoms.  Spoke with Dr. Delrae Alfred -- advised pt. to go to ED.  Verbalized understanding and agreement. Follow-up by: Dutch Quint RN,  March 14, 2010 6:08 PM     Appended Document: Sudden change in mobility Pt. has been waiting for Neurology appt.--not until March as could not get in locally--for MS. Nora--did she ever get set up with vestibular PT?      Appended Document: Sudden change in mobility PT HAS AN APPT 04-12-10 @ 8:45AM [facility] . I DIDN'T SEND PT TO PT VASCULAR I WILL SEND IT TODAY

## 2010-03-22 LAB — GLUCOSE, CAPILLARY: Glucose-Capillary: 217 mg/dL — ABNORMAL HIGH (ref 70–99)

## 2010-03-29 ENCOUNTER — Encounter: Payer: Self-pay | Admitting: Rehabilitative and Restorative Service Providers"

## 2010-03-31 NOTE — Discharge Summary (Signed)
NAMEDOUGLAS, ROOKS NO.:  [id number]  MEDICAL RECORD NO.:  [id number]           PATIENT TYPE:  LOCATION:                                 FACILITY:  PHYSICIAN:  Doneen Poisson, MD     DATE OF BIRTH:  18-Jan-1971  DATE OF ADMISSION:  03/15/2010 DATE OF DISCHARGE:  03/21/2010                              DISCHARGE SUMMARY   ATTENDING PHYSICIAN:  Doneen Poisson, MD  DISCHARGE DIAGNOSES: 1. Relapsing remitting multiple sclerosis with acute flare. 2. History of optic neuritis. 3. Diabetes mellitus type 2. 4. Hypertension. 5. Lack of material resources.  DISCHARGE MEDICATIONS: 1. Amlodipine 5 mg 1 tablet by mouth daily. 2. Benazepril 40 mg 1 tablet by mouth daily. 3. Prednisone 20 mg take 4 tablets by mouth daily for 1 day, then 3     tablets daily for 3 days, then 2 tablets daily for 3 days, then 1     tablet daily for 3 days and stop. 4. Aspirin 81 mg 1 tablet by mouth daily. 5. Hydrochlorothiazide 25 mg 1 tablet by mouth daily. 6. Metformin 500 mg take 1 tablet twice daily while on steroids, then     resume previous dose of 1 tablet daily. 7. Metoprolol 25 mg 1 tablet by mouth twice daily.  DISPOSITION AND FOLLOWUP:  Ms. Spilman was discharged from [facility] on March 21, 2010 in stable and improved condition.  She still suffered from gait instability and lower extremity weakness, although it appeared to be slowly improving from admission.  After completing a course of IV steroids in the hospital, she was discharged with instructions to complete a 10-day course of oral prednisone.  She will follow up at [facility] in the next 1-2 weeks where the paln to initiate disease modifying medications will be discussed.  She will also follow up with her primary care physician Dr. Delrae Alfred at [facility] on March 16 at 2:15 p.m. at which time her hypertension and diabetes management should be further addressed.  CONSULTATIONS:   Neurology, Dr. Terrace Arabia.  PROCEDURES PERFORMED: 1. CT of head without contrast on February 22 which demonstrated no     acute intracranial abnormality, stable diffuse white matter     hypoattenuation likely reflecting quality of chronic microvascular     ischemia noted. 2. Portable chest x-ray on February 22 which demonstrated no active     cardiopulmonary disease. 3. MRI of head with and without contrast on February 25 which     demonstrated widespread white matter pathology consistent with     advanced chronic multiple sclerosis, involvement of cerebral     hemispheric white matter appeared slightly more extensive than in     May 2011.  A 3-mm focus of blood brain barrier breakdown in the     right frontal white matter suggest an active focus of     demyelination. 4. MRI of C-spine with and without contrast on February 25 which     demonstrated low level abnormal T2 signal affecting the spinal cord     in the region from C3-C5 consistent with some demyelination in that  region.  ADMISSION HISTORY:  Ms. Barbone is a 40 year old woman with a past medical history of diabetes, multiple sclerosis, and hypertension who came to the ED for leg imbalance.  She has a history of MS with optic neuritis diagnosed approximately 3 years prior and treated with steroids at that time but has since had no neurology follow-up.  For the week prior to admission, she had intermittent buckling of knees with ambulation.  She also had mild dizziness with nausea and vomiting with food particles.  She denied falling.  She also denied lower extremity weakness and paresthesia, vision change, headache, fever, chest pain, shortness of breath, abdominal pain, diarrhea, or dysuria.  ADMITTING PHYSICAL EXAMINATION: VITALS:  Blood pressure 175/100, pulse rate 106, respiratory rate 18,  temperature 99.5, oxygen saturation 100% on room air. GENERAL APPEARANCE:  No acute distress.  Normal speech. HEAD:  Normocephalic  and atraumatic. EYES:  Pupils equal, round, and reactive to light.  Extraocular motions intact. MOUTH:  Moist mucous membranes. NECK:  Supple.  No thyromegaly or JVD. LUNGS:  Clear to auscultation bilaterally.  No wheezing or crackles. CARDIOVASCULAR:  Regular rate and rhythm.  No murmurs. ABDOMEN:  Positive bowel sounds, soft. EXTREMITIES:  No edema. NEURO:  Alert and oriented x 3.  Cranial nerves II through XII intact. Sensation normal.  Muscle strength 5/5. SKIN:  Warm and dry.  ADMITTING LABORATORY DATA:  White blood cell count 9.7, hemoglobin 13.8, hematocrit 39.2, platelet count 255,000.  Sodium 137, potassium 3.0, chloride 102, bicarb 24, BUN 9, creatinine 0.74, glucose 110, calcium 9.3.  HOSPITAL COURSE: 1. Acute MS flare.  Ms. Sladek presented with new onset gait     instability, sense of imbalance, and intermittent leg weakness.     She was diagnosed with multiple sclerosis 3 years prior presenting     with optic neuritis of the left eye that resolved with steroid     treatment.  She was seen by [facility] but had     not been following regularly.  Neurology, Dr. Terrace Arabia was consulted and     felt that her symptoms were consistent with MS flare.  She was     started on IV steroids.  An MRI of the brain and spine was ordered     but could not be obtained after 2 failed attempts because of the     patient's severe anxiety and claustrophobia.  MRI was finally     obtained under conscious sedation and demonstrated widespread white     matter pathology consistent with advanced chronic multiple     sclerosis.  The involvement of the cerebral hemispheric white     matter appeared more extensive than the prior study in May 2011.  A     3-mm focus of blood brain barrier breakdown in the right frontal     white matter suggested an active focus of demyelination.  MRI of     the C-spine also showed abnormalities including evidence of     demyelination in the C3-C5  region.  She was treated with a     total of 5 days of IV steroids prior to transitioning to a 10-day     prednisone taper for treatment of the MS flare.  She was seen by     physical therapy and occupational therapy and her instability and     leg weakness seemed to be improving slowly over the course of     admission.  She still had ongoing  gait instability at discharge.     Short-term rehab placement was considered but she preferred     to return home.  She was discharged home with home health physical     therapy.  Her sons agreed to stay with her while she recovers, so     she will have 24-hour assistance.  She will follow up with [facility] 1-2 weeks following discharge at which time     she may be started on a disease-modifying medication regimen.  2. Hypertension. Ms. Goupil blood pressure was elevated during      admission, so we increased her benazepril dose from 20 mg to 40 mg      daily.  At followup with her PCP, Dr. Delrae Alfred, her hypertension      management should be further addressed.  3. Diabetes mellitus type 2.  Metformin was held during admission and     she was managed on sliding scale insulin.  CBGs were     elevated in the setting of steroids.  She was advised to     take her metformin twice daily instead of daily for the next few days     and follow up with her PCP for further diabetes management.     Hemoglobin A1c of 5.7 demonstrated generally tight diabetic     control.  DISCHARGE VITALS:  Temperature 98.6, blood pressure 164/98, pulse 56, respiratory rate 20, oxygen saturation 100% on room air.  DISCHARGE LABS:  Sodium 138, potassium 3.1, chloride 103, bicarb 29, BUN 19, creatinine 0.82, glucose 168.   ______________________________ Whitney Post, MD   ______________________________ Doneen Poisson, MD   EB/MEDQ  D:  03/23/2010  T:  03/24/2010  Job:  [id number]  cc:   Levert Feinstein, MD Marcene Duos,  M.D.  Electronically Signed by Whitney Post MD on 03/26/2010 11:41:29 PM Electronically Signed by Doneen Poisson  on 03/31/2010 11:13:20 AM

## 2010-04-04 LAB — POCT I-STAT, CHEM 8
Calcium, Ion: 1.1 mmol/L — ABNORMAL LOW (ref 1.12–1.32)
Chloride: 103 mEq/L (ref 96–112)
HCT: 43 % (ref 36.0–46.0)
Hemoglobin: 14.6 g/dL (ref 12.0–15.0)
Potassium: 2.5 mEq/L — CL (ref 3.5–5.1)

## 2010-04-04 LAB — POCT CARDIAC MARKERS: Myoglobin, poc: 42.4 ng/mL (ref 12–200)

## 2010-04-04 LAB — DIFFERENTIAL
Basophils Absolute: 0 10*3/uL (ref 0.0–0.1)
Basophils Relative: 0 % (ref 0–1)
Eosinophils Absolute: 0 10*3/uL (ref 0.0–0.7)
Neutro Abs: 6.1 10*3/uL (ref 1.7–7.7)
Neutrophils Relative %: 68 % (ref 43–77)

## 2010-04-04 LAB — CBC
MCH: 32.6 pg (ref 26.0–34.0)
MCHC: 35.3 g/dL (ref 30.0–36.0)
Platelets: 284 10*3/uL (ref 150–400)
RDW: 12.2 % (ref 11.5–15.5)

## 2010-04-11 LAB — POCT I-STAT, CHEM 8
BUN: 14 mg/dL (ref 6–23)
Creatinine, Ser: 0.8 mg/dL (ref 0.4–1.2)
Glucose, Bld: 104 mg/dL — ABNORMAL HIGH (ref 70–99)
Hemoglobin: 15.6 g/dL — ABNORMAL HIGH (ref 12.0–15.0)
Potassium: 4.8 mEq/L (ref 3.5–5.1)
Sodium: 139 mEq/L (ref 135–145)

## 2010-05-24 NOTE — Consult Note (Signed)
  NAMEJACKALYN, [name] [name]             ACCOUNT NO.:  [id number]  MEDICAL RECORD NO.:  [id number]           PATIENT TYPE:  I  LOCATION:  4501                         FACILITY:  [facility]  PHYSICIAN:  Levert Feinstein, MD          DATE OF BIRTH:  1970-09-25  DATE OF CONSULTATION:  03/14/2010 DATE OF DISCHARGE:                                CONSULTATION   CHIEF COMPLAINT:  Imbalance.  HISTORY OF PRESENT ILLNESS:  A 40 year old woman with past medical history of multiple sclerosis diagnosed 3 years ago by MRI and clinical features came to the ED complaining of being imbalance.  The patient reports that she has been experiencing some leg weakness intermittently over the last week that caused some buckling of knees with ambulation. Associated with dizziness, vertigo, nausea, and vomiting.  Denies fall or trauma, vision changes, headaches, fever, or chills.  Currently, denies any diplopia, sensory deficits, bladder incontinence.  PAST MEDICAL HISTORY: 1. Hypertension. 2. Diabetes. 3. Multiple sclerosis. 4. Optic neuritis.  MEDICATIONS: 1. Aspirin 81 mg p.o. daily. 2. Benazepril 20 mg p.o. daily. 3. Hydrochlorothiazide 25 mg p.o. daily. 4. Metformin 500 mg p.o. daily. 5. Metoprolol 25 mg p.o. b.i.d.  ALLERGIES:  No known drug allergies.  FAMILY HISTORY: 1. Hypertension. 2. Diabetes. 3. Gout.  SOCIAL HISTORY:  Single, has two children.  Smokes 6-7 cigarettes a day and has been doing so further last 20 years.  REVIEW OF SYSTEMS:  All negative per HPI.  PHYSICAL EXAMINATION:  VITAL SIGNS:  Blood pressure 175/100, pulse 106, respiratory rate 18, temperature 99.5. MENTAL STATUS:  Alert and oriented x3, carries out two-step commands. CRANIAL NERVES:  Eyes, PERRLA, conjugate gaze, EOMI.  Visual fields intact.  Face symmetrical.  Tongue midline.  Uvula midline.  Sensation V1-V3 normal.  Shoulder shrug and head turn normal.  Coordination, finger-to-nose normal, heel-to-shin normal.  Fine  motor movement normal. Deep tendon reflexes +1 throughout.  Gait, ataxic.  Motor 5/5 strength throughout.  Lhermitte sign negative.  Drift none. PULMONARY:  Clear to auscultation bilaterally. CARDIOVASCULAR:  Regular rate and rhythm.  No murmurs, rubs, or gallops. NECK:  Supple.  Sensation normal.  LABORATORY DATA:  Sodium 137, potassium 3, chloride 102, bicarb 24, BUN 9, creatinine 0.74, glucose 110.  White blood cells 9.7, hemoglobin 13.8, hematocrit 39.2, platelets 255,000.  IMAGING:  CT of the head, impression: 1. No acute intracranial abnormality, significant interval change. 2. Stable diffuse white matter hypoattenuation likely reflecting     chronic microvascular ischemia.  ASSESSMENT:  Multiple sclerosis flare-up due to functionally disabling cerebellar symptoms.  There is indication for steroid therapy. Recommend starting disease modifying therapy as an outpatient.  Side effects of steroids may unmask infection, self-draining possible infection is ruled out, initiation of medication appropriate.  Check MRI with and without contrast.  Neurology to followup.     Danne Harbor, MD   ______________________________ Levert Feinstein, MD    RV/MEDQ  D:  03/15/2010  T:  03/16/2010  Job:  [id number]  Electronically Signed by Danne Harbor MD on 04/26/2010 02:00:22 PM Electronically Signed by Levert Feinstein MD on 05/24/2010 09:39:55 AM

## 2010-06-09 NOTE — Op Note (Signed)
[name] [name], [name] [name]                         ACCOUNT NO.:  [id number]   MEDICAL RECORD NO.:  [id number]                   PATIENT TYPE:  AMB   LOCATION:  SDC                                  FACILITY:  [facility]   PHYSICIAN:  [place] B. Earlene Plater, M.D.               DATE OF BIRTH:  1970/08/07   DATE OF PROCEDURE:  02/01/2003  DATE OF DISCHARGE:                                 OPERATIVE REPORT   PREOPERATIVE DIAGNOSIS:  Menorrhagia.   POSTOPERATIVE DIAGNOSIS:  Menorrhagia.   PROCEDURE:  Hysteroscopy and NovaSure endometrial ablation.   SURGEON:  Chester Holstein. Earlene Plater, M.D.   ANESTHESIA:  LMA general.   FINDINGS:  Normal-appearing uterine cavity and endometrium.  Normal size  anteverted uterus.  Examination under anesthesia, no adnexal masses were  palpable.   INDICATIONS FOR PROCEDURE:  The patient with a history of menorrhagia,  status post previous tubal ligation, desires minimally invasive definitive  therapy, presents for NovaSure endometrial ablation.  Sonohistogram in the  office showed a thickened endometrium.  Preoperative biopsy was benign.  The  operative risks were discussed in the patient meeting, including uterine  perforation, fluid overload, damage to surrounding organs.  All questions  answered.  The patient wished to proceed.   DESCRIPTION OF PROCEDURE:  The patient was taken to the operating room and  LMA general anesthesia was obtained.  She was placed in the Murdock stirrups  and prepped and draped in the standard fashion.  Bladder emptied with red  rubber catheter.  The uterus was sounded to 8.5 cm.  The cervical length was  estimated with Hegar dilator at 4.5 cm.  The cervix was then dilated to #21  after pericervical block placed with 10 mL of 1% Nesacaine.   Scope was inserted after being flushed with lactated Ringers and with good  uterine distention, the cavity was inspected with the above findings noted.  As there was no additional pathology visualized, I preceded  with the  NovaSure.   The cervix was dilated to #31.  The NovaSure device was inserted to the  fundus and pulled back slightly.  The array was deployed and fitted to the  cavity through standard maneuvers.  The CO2 test was performed and was  satisfactory.  The ablation was subsequently performed.  The cavity width  was determined to be 4.5 cm after the maneuvers were performed.  The power  was 99 watts and the operating time was 115 seconds.  The NovaSure was  removed and the hysteroscope was reinserted and the cavity inspected.  Good  coverage was noted with no evidence of perforation.  The scope was removed  and the single-tooth tenaculum removed from the cervix.  It was hemostatic.  The patient was taken to the recovery room awake, alert and in stable  condition.  There were no complications.  Gerri Spore B. Earlene Plater, M.D.   WBD/MEDQ  D:  02/01/2003  T:  02/01/2003  Job:  [id number]

## 2011-03-15 ENCOUNTER — Other Ambulatory Visit: Payer: Self-pay | Admitting: Neurology

## 2011-03-15 DIAGNOSIS — G35 Multiple sclerosis: Secondary | ICD-10-CM

## 2011-03-15 DIAGNOSIS — E1110 Type 2 diabetes mellitus with ketoacidosis without coma: Secondary | ICD-10-CM

## 2011-03-23 ENCOUNTER — Other Ambulatory Visit: Payer: Medicaid Other

## 2011-03-30 ENCOUNTER — Other Ambulatory Visit: Payer: Medicaid Other

## 2012-02-20 IMAGING — CT CT HEAD W/O CM
1 series · 16 of 30 positions shown, 20 images · non-contrast
Comparison: Brain MRI 06/03/2009.

CLINICAL DATA: 39-year-old female with vertigo, nausea.  Multiple
sclerosis.

CT HEAD WITHOUT CONTRAST
TECHNIQUE: Contiguous axial images were obtained from the base of
the skull through the vertex without contrast.

[Series 2: head routine 4.8 h37s · axial · 0.43mm/px · z∈[-131,-0]mm · 16 of 30 slices shown, 20 images]
[im 2/30  brain]
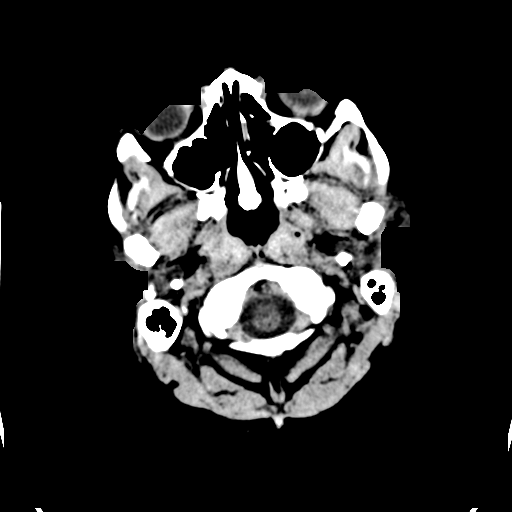
[im 2/30  bone]
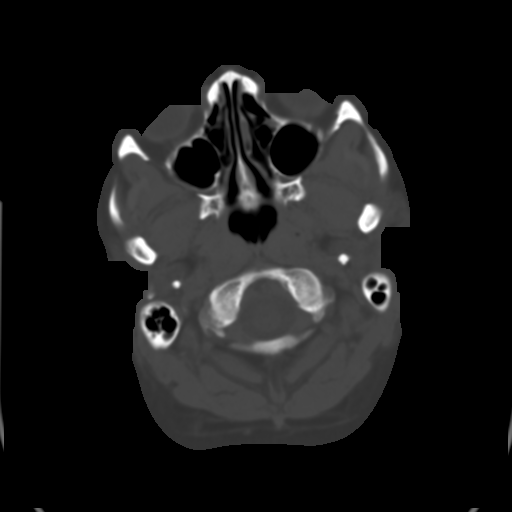
[im 4/30  brain]
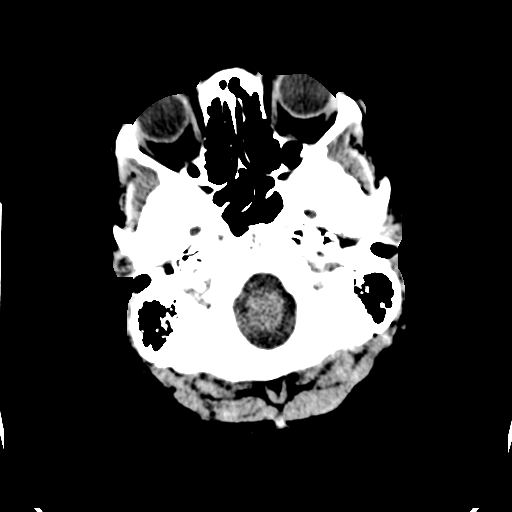
[im 6/30  brain]
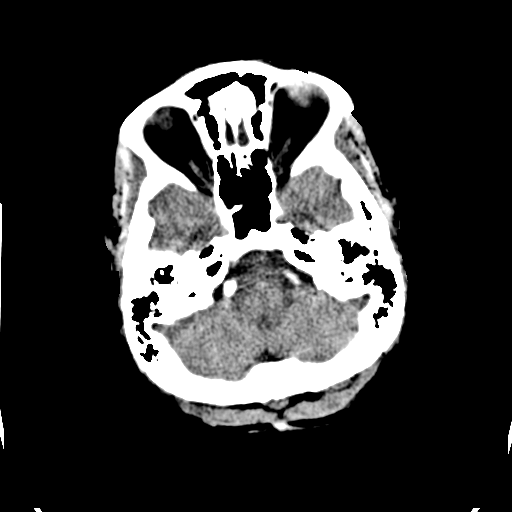
[im 8/30  brain]
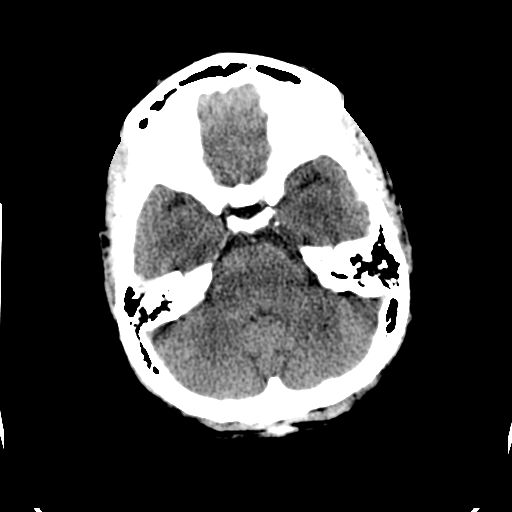
[im 9/30  brain]
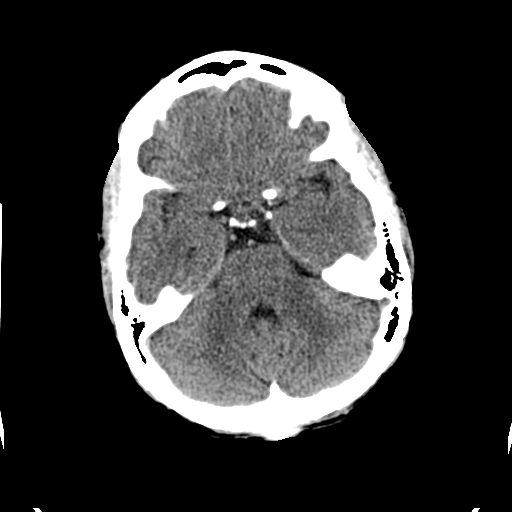
[im 9/30  bone]
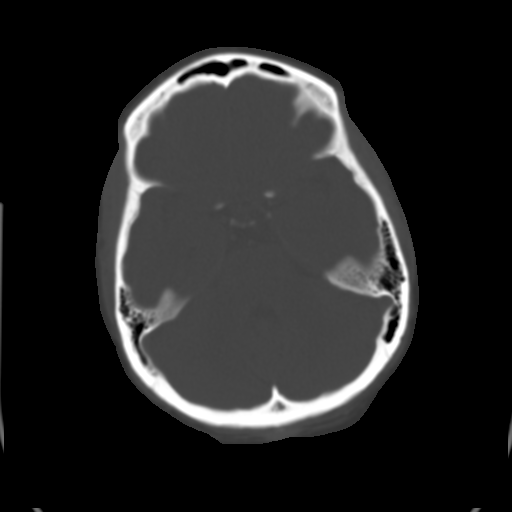
[im 11/30  brain]
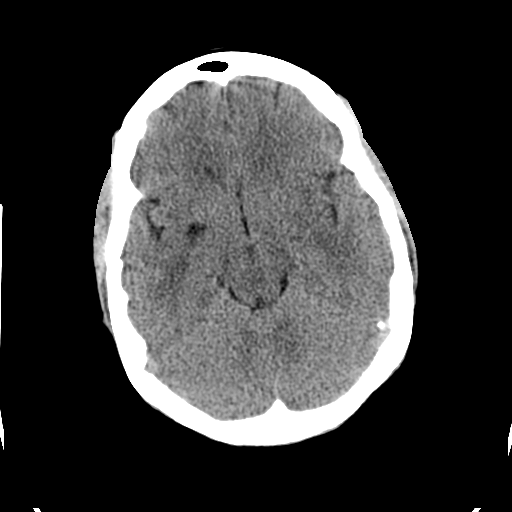
[im 13/30  brain]
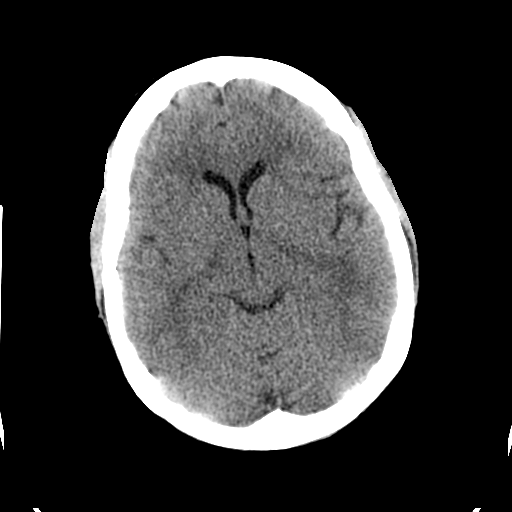
[im 15/30  brain]
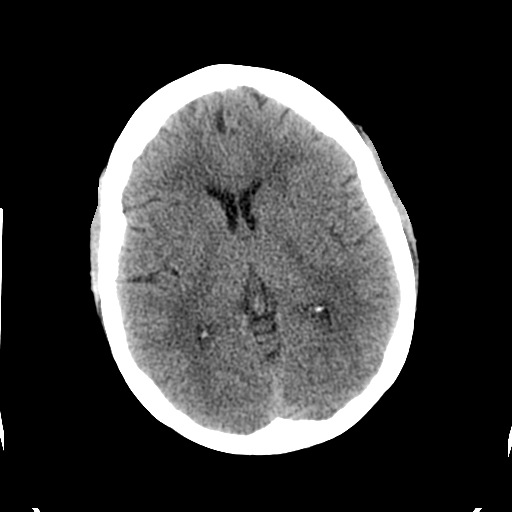
[im 16/30  brain]
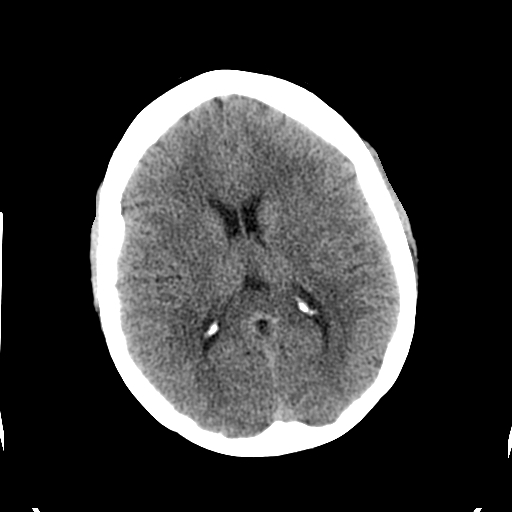
[im 16/30  bone]
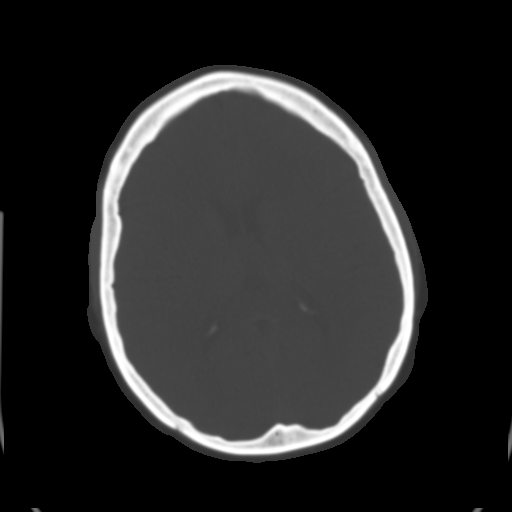
[im 18/30  brain]
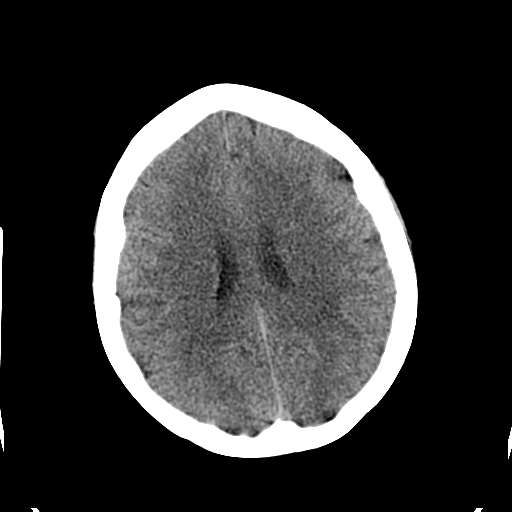
[im 20/30  brain]
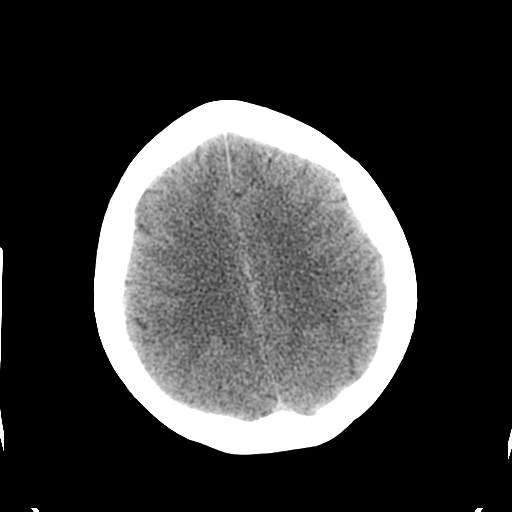
[im 22/30  brain]
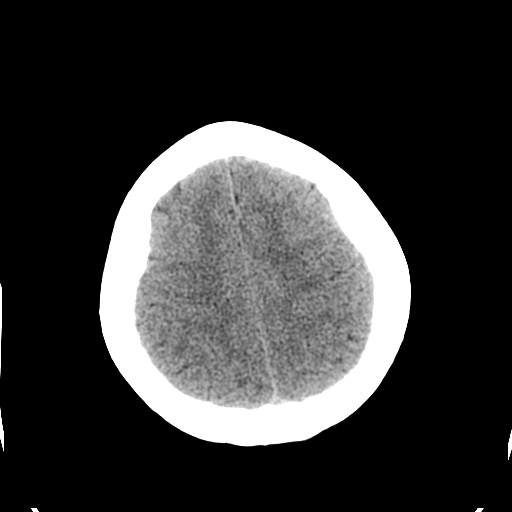
[im 23/30  brain]
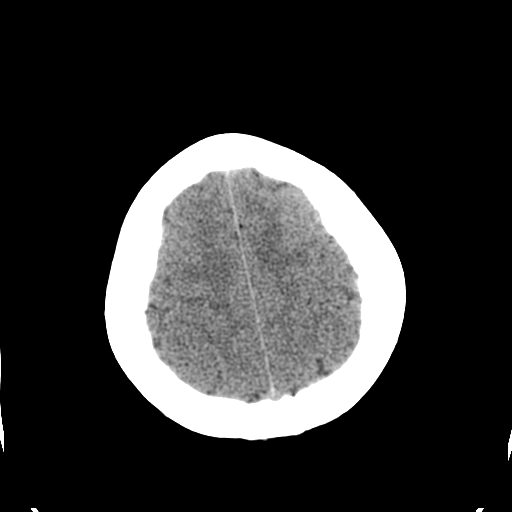
[im 23/30  bone]
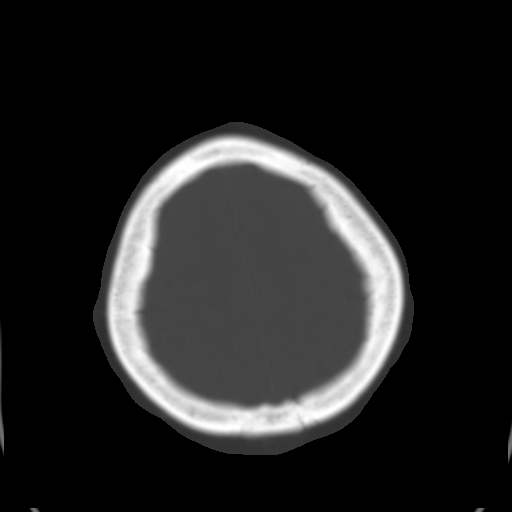
[im 25/30  brain]
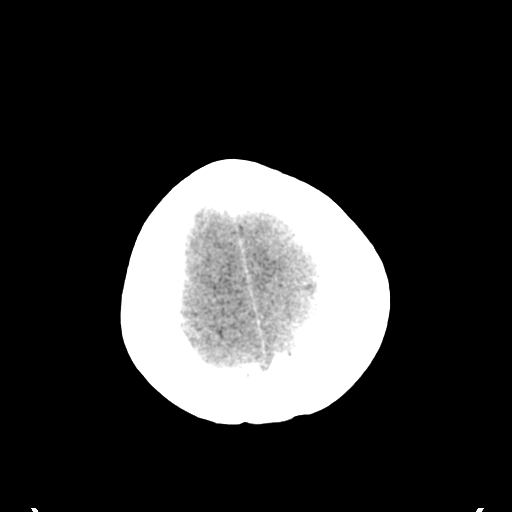
[im 27/30  brain]
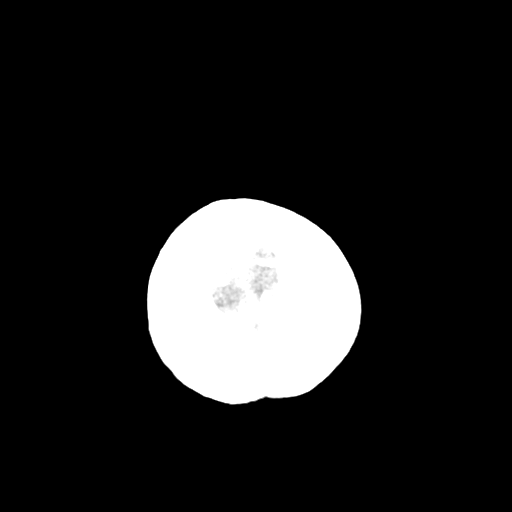
[im 29/30  brain]
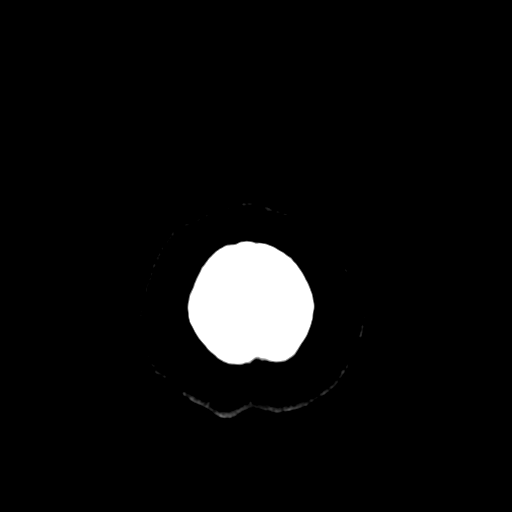

[16 of 30 positions shown; findings below may reference images not displayed]

FINDINGS: Visualized orbits and scalp soft tissues are within
normal limits.  Mild Calcified atherosclerosis at the skull base.
Visualized paranasal sinuses and mastoids are clear.  No acute
osseous abnormality identified.

Cerebral volume is within normal limits for age.  No midline shift,
mass effect, or evidence of mass lesion.  No ventriculomegaly.
Stable dilated perivascular space at the right basal ganglia. No
evidence of cortically based acute infarction identified.
Bilateral cerebral white matter disease is subtle on CT. No acute
intracranial hemorrhage identified.  No suspicious intracranial
vascular hyperdensity.
IMPRESSION: Widespread white matter disease was much more apparent on the
comparison MRI. No acute intracranial abnormality.

## 2012-05-27 ENCOUNTER — Inpatient Hospital Stay (HOSPITAL_COMMUNITY)
Admission: EM | Admit: 2012-05-27 | Discharge: 2012-05-31 | DRG: 069 | Disposition: A | Discharge status: Home / Self Care | Payer: Medicaid Other | Attending: Internal Medicine | Admitting: Internal Medicine

## 2012-05-27 ENCOUNTER — Encounter (HOSPITAL_COMMUNITY): Payer: Self-pay | Admitting: *Deleted

## 2012-05-27 ENCOUNTER — Emergency Department (HOSPITAL_COMMUNITY): Payer: Medicaid Other

## 2012-05-27 DIAGNOSIS — R42 Dizziness and giddiness: Secondary | ICD-10-CM | POA: Diagnosis present

## 2012-05-27 DIAGNOSIS — E119 Type 2 diabetes mellitus without complications: Secondary | ICD-10-CM | POA: Diagnosis present

## 2012-05-27 DIAGNOSIS — R29898 Other symptoms and signs involving the musculoskeletal system: Secondary | ICD-10-CM | POA: Diagnosis present

## 2012-05-27 DIAGNOSIS — I10 Essential (primary) hypertension: Secondary | ICD-10-CM | POA: Diagnosis present

## 2012-05-27 DIAGNOSIS — W19XXXA Unspecified fall, initial encounter: Secondary | ICD-10-CM

## 2012-05-27 DIAGNOSIS — E785 Hyperlipidemia, unspecified: Secondary | ICD-10-CM | POA: Diagnosis present

## 2012-05-27 DIAGNOSIS — D72829 Elevated white blood cell count, unspecified: Secondary | ICD-10-CM | POA: Diagnosis present

## 2012-05-27 DIAGNOSIS — R4182 Altered mental status, unspecified: Secondary | ICD-10-CM

## 2012-05-27 DIAGNOSIS — E876 Hypokalemia: Secondary | ICD-10-CM

## 2012-05-27 DIAGNOSIS — G35 Multiple sclerosis: Secondary | ICD-10-CM | POA: Diagnosis present

## 2012-05-27 DIAGNOSIS — H8110 Benign paroxysmal vertigo, unspecified ear: Secondary | ICD-10-CM

## 2012-05-27 DIAGNOSIS — Z794 Long term (current) use of insulin: Secondary | ICD-10-CM

## 2012-05-27 DIAGNOSIS — G459 Transient cerebral ischemic attack, unspecified: Secondary | ICD-10-CM | POA: Diagnosis present

## 2012-05-27 DIAGNOSIS — F17200 Nicotine dependence, unspecified, uncomplicated: Secondary | ICD-10-CM | POA: Diagnosis present

## 2012-05-27 DIAGNOSIS — Z7982 Long term (current) use of aspirin: Secondary | ICD-10-CM

## 2012-05-27 HISTORY — DX: Multiple sclerosis: G35

## 2012-05-27 HISTORY — DX: Essential (primary) hypertension: I10

## 2012-05-27 HISTORY — DX: Type 2 diabetes mellitus without complications: E11.9

## 2012-05-27 LAB — GLUCOSE, CAPILLARY: Glucose-Capillary: 134 mg/dL — ABNORMAL HIGH (ref 70–99)

## 2012-05-27 MED ORDER — SODIUM CHLORIDE 0.9 % IV BOLUS (SEPSIS)
500.0000 mL | Freq: Once | INTRAVENOUS | Status: AC
  Administered 2012-05-28: 500 mL via INTRAVENOUS

## 2012-05-27 MED ORDER — DIAZEPAM 5 MG/ML IJ SOLN
2.5000 mg | Freq: Once | INTRAMUSCULAR | Status: AC
  Administered 2012-05-28: 2.5 mg via INTRAVENOUS
  Filled 2012-05-27: qty 2

## 2012-05-27 MED ORDER — PROMETHAZINE HCL 25 MG/ML IJ SOLN
25.0000 mg | Freq: Once | INTRAMUSCULAR | Status: AC
  Administered 2012-05-28: 25 mg via INTRAVENOUS
  Filled 2012-05-27: qty 1

## 2012-05-27 NOTE — ED Notes (Signed)
The pt fell earlier tonight and since then she has not been able to stand on her rt leg and  Her son who last saw her at 1500.  She has ms and usually walks with a cain or a walker .  Face even features and she has no arm drift the son thinks her speech is still slurred

## 2012-05-27 NOTE — ED Notes (Signed)
CODE Sroke canceled 2351.

## 2012-05-27 NOTE — ED Provider Notes (Signed)
History     CSN: [id number]  Arrival date & time 05/27/12  2322   First MD Initiated Contact with Patient 05/27/12 2344      Chief Complaint  Patient presents with  . ams    HPI patient has a history of multiple sclerosis. Patient says she fell earlier today, son last saw her normal at 1500, he says she is slurring her speech. Due to her multiple sclerosis, patient typically walks with assistance using a cane or walker. Patient also has a history of vertigo and she frequently vomits. Patient is vomiting now. He says every time she lays down flat she starts vomiting.  Her symptoms started about 3:00, there is severe, are ongoing, no other alleviating or exacerbating factors. Patient denies any chest pain, shortness of breath, abdominal pain, fevers or chills. Patient denies any alcohol or illicit drug use, patient does smoke cigarettes.  Past Medical History  Diagnosis Date  . Multiple sclerosis     No past surgical history on file.  No family history on file.  History  Substance Use Topics  . Smoking status: Never Smoker   . Smokeless tobacco: Not on file  . Alcohol Use: No    OB History   Grav Para Term Preterm Abortions TAB SAB Ect Mult Living                  Review of Systems At least 10pt or greater review of systems completed and are negative except where specified in the HPI.  Allergies  Review of patient's allergies indicates not on file.  Home Medications  No current outpatient prescriptions on file.  BP 155/86  Pulse 117  Temp(Src) 99 F (37.2 C) (Oral)  Resp 16  SpO2 100%  Physical Exam  Nursing notes reviewed.  Electronic medical record reviewed. VITAL SIGNS:   Filed Vitals:   05/27/12 2326 05/28/12 0000  BP: 155/86 154/116  Pulse: 117 114  Temp: 99 F (37.2 C)   TempSrc: Oral   Resp: 16 23  SpO2: 100% 100%   CONSTITUTIONAL: Awake, oriented, appears non-toxic HENT: Atraumatic, normocephalic, oral mucosa pink and moist, airway patent. Nares  patent without drainage. External ears normal. EYES: Conjunctiva clear, EOMI, PERRLA NECK: Trachea midline, non-tender, supple CARDIOVASCULAR: Normal heart rate, Normal rhythm, No murmurs, rubs, gallops PULMONARY/CHEST: Clear to auscultation, no rhonchi, wheezes, or rales. Symmetrical breath sounds. Non-tender. ABDOMINAL: Non-distended, soft, non-tender - no rebound or guarding.  BS normal. NEUROLOGIC: Non-focal, moving all four extremities, no gross sensory or motor deficits. EXTREMITIES: No clubbing, cyanosis, or edema SKIN: Warm, Dry, No erythema, No rash  ED Course  Procedures (including critical care time)  Date: 05/28/2012  Rate: 116  Rhythm: Sinus tachycardia  QRS Axis: normal  Intervals: normal  ST/T Wave abnormalities: normal  Conduction Disutrbances: none  Narrative Interpretation: No prior EKG for comparison, sinus tachycardia, probable atrial enlargement, otherwise unremarkable  CRITICAL CARE Performed by: Jones Skene Total critical care time: 30 - altered mental status Critical care time was exclusive of separately billable procedures and treating other patients. Critical care was necessary to treat or prevent imminent or life-threatening deterioration. Critical care was time spent personally by me on the following activities: development of treatment plan with patient and/or surrogate as well as nursing, discussions with consultants, evaluation of patient's response to treatment, examination of patient, obtaining history from patient or surrogate, ordering and performing treatments and interventions, ordering and review of laboratory studies, ordering and review of radiographic studies, pulse oximetry and  re-evaluation of patient's condition.   Labs Reviewed  GLUCOSE, CAPILLARY - Abnormal; Notable for the following:    Glucose-Capillary 134 (*)    All other components within normal limits  CBC WITH DIFFERENTIAL  COMPREHENSIVE METABOLIC PANEL  URINALYSIS, ROUTINE W  REFLEX MICROSCOPIC  URINE RAPID DRUG SCREEN (HOSP PERFORMED)  ETHANOL  POCT I-STAT TROPONIN I   Dg Chest Port 1 View  05/28/2012  *RADIOLOGY REPORT*  Clinical Data: 42 year old female altered mental status vomiting dizzy shortness of breath.  PORTABLE CHEST - 1 VIEW  Comparison: 03/15/2010 and earlier.  Findings: Portable semi upright AP view 2358 hours.  Cardiac size and mediastinal contours are within normal limits.  Visualized tracheal air column is within normal limits.  Lung volumes within normal limits.  Allowing for portable technique, the lungs are clear.  No pneumothorax or effusion.  IMPRESSION: Negative, no acute cardiopulmonary abnormality.   Original Report Authenticated By: Erskine Speed, M.D.      1. Multiple sclerosis   2. Right leg weakness   3. Altered mental status   4. Hypokalemia   5. Leukocytosis   6. Type II or unspecified type diabetes mellitus without mention of complication, not stated as uncontrolled      Medications  aspirin EC tablet 325 mg (not administered)  hydrochlorothiazide (HYDRODIURIL) tablet 25 mg (not administered)  metoprolol tartrate (LOPRESSOR) tablet 25 mg (not administered)  senna-docusate (Senokot-S) tablet 1 tablet (not administered)  insulin aspart (novoLOG) injection 0-9 Units (not administered)  insulin aspart (novoLOG) injection 0-5 Units (not administered)  enoxaparin (LOVENOX) injection 40 mg (not administered)  0.9 %  sodium chloride infusion (not administered)  acetaminophen (TYLENOL) tablet 650 mg (not administered)    Or  acetaminophen (TYLENOL) suppository 650 mg (not administered)  ondansetron (ZOFRAN) injection 4 mg (not administered)  diazepam (VALIUM) injection 2.5 mg (2.5 mg Intravenous Given 05/28/12 0003)  promethazine (PHENERGAN) injection 25 mg (25 mg Intravenous Given 05/28/12 0003)  sodium chloride 0.9 % bolus 500 mL (0 mLs Intravenous Stopped 05/28/12 0056)  sodium chloride 0.9 % bolus 500 mL (0 mLs Intravenous Stopped  05/28/12 0343)  potassium chloride SA (K-DUR,KLOR-CON) CR tablet 40 mEq (40 mEq Oral Given 05/28/12 0442)    MDM  Patient arrives initially as a code stroke. Patient is outside the window since her symptoms started at 3 PM, she was evaluated at midnight.  Both myself and the neural hospitalist, Dr. Amada Jupiter, evaluated the patient and do not feel the patient is a code stroke patient. Patient may have some cerebellar issues, she does have risk factors for CVA including diabetes, hypertension and hyperlipidemia.  Obtain CT of the head, urine tox, blood ethanol, and basic labs.  EKG at this point is nondiagnostic and nonischemic-sinus tachycardia.  Labs at this point show a white count of 22.9, uncertain of the source for this is indeed related to infection. Patient's currently afebrile. Replete potassium, treated tachycardia with fluids, UA and chest x-ray not indicative of infection. Patient is moving her neck around does not have a headache or stiff neck I doubt meningitis.  Patient is tachycardic, question dehydration versus sepsis, do not think the patient is septic at this time, blood pressures have been maintained steadily in the 140s and 150s.  Discussed with [facility] for admission         Jones Skene, MD 05/28/12 0830

## 2012-05-28 ENCOUNTER — Emergency Department (HOSPITAL_COMMUNITY): Payer: Medicaid Other

## 2012-05-28 ENCOUNTER — Inpatient Hospital Stay (HOSPITAL_COMMUNITY): Payer: Medicaid Other

## 2012-05-28 ENCOUNTER — Encounter (HOSPITAL_COMMUNITY): Payer: Self-pay | Admitting: Internal Medicine

## 2012-05-28 DIAGNOSIS — E119 Type 2 diabetes mellitus without complications: Secondary | ICD-10-CM

## 2012-05-28 DIAGNOSIS — E876 Hypokalemia: Secondary | ICD-10-CM

## 2012-05-28 DIAGNOSIS — R29898 Other symptoms and signs involving the musculoskeletal system: Secondary | ICD-10-CM | POA: Diagnosis present

## 2012-05-28 DIAGNOSIS — W19XXXA Unspecified fall, initial encounter: Secondary | ICD-10-CM

## 2012-05-28 DIAGNOSIS — D72829 Elevated white blood cell count, unspecified: Secondary | ICD-10-CM | POA: Diagnosis present

## 2012-05-28 LAB — RAPID URINE DRUG SCREEN, HOSP PERFORMED
Amphetamines: NOT DETECTED
Barbiturates: NOT DETECTED
Cocaine: NOT DETECTED
Opiates: NOT DETECTED
Tetrahydrocannabinol: NOT DETECTED

## 2012-05-28 LAB — COMPREHENSIVE METABOLIC PANEL
ALT: 10 U/L (ref 0–35)
AST: 21 U/L (ref 0–37)
Albumin: 4.6 g/dL (ref 3.5–5.2)
Alkaline Phosphatase: 85 U/L (ref 39–117)
CO2: 25 mEq/L (ref 19–32)
Chloride: 102 mEq/L (ref 96–112)
Creatinine, Ser: 0.5 mg/dL (ref 0.50–1.10)
GFR calc non Af Amer: >90 mL/min (ref >90)
Potassium: 3 mEq/L — ABNORMAL LOW (ref 3.5–5.1)
Sodium: 141 mEq/L (ref 135–145)
Total Bilirubin: 1.9 mg/dL — ABNORMAL HIGH (ref 0.3–1.2)

## 2012-05-28 LAB — CBC WITH DIFFERENTIAL/PLATELET
Band Neutrophils: 0 % (ref 0–10)
Basophils Absolute: 0 10*3/uL (ref 0.0–0.1)
Basophils Relative: 0 % (ref 0–1)
Basophils Relative: 0 % (ref 0–1)
Eosinophils Absolute: 0 10*3/uL (ref 0.0–0.7)
Eosinophils Relative: 0 % (ref 0–5)
HCT: 33 % — ABNORMAL LOW (ref 36.0–46.0)
Hemoglobin: 13.8 g/dL (ref 12.0–15.0)
Hemoglobin: 12.2 g/dL (ref 12.0–15.0)
Lymphocytes Relative: 8 % — ABNORMAL LOW (ref 12–46)
Lymphs Abs: 1.4 10*3/uL (ref 0.7–4.0)
MCH: 33 pg (ref 26.0–34.0)
MCHC: 39.5 g/dL — ABNORMAL HIGH (ref 30.0–36.0)
MCV: 83.5 fL (ref 78.0–100.0)
Metamyelocytes Relative: 0 %
Monocytes Absolute: 1.3 10*3/uL — ABNORMAL HIGH (ref 0.1–1.0)
Monocytes Relative: 7 % (ref 3–12)
Myelocytes: 0 %
Neutro Abs: 16.4 10*3/uL — ABNORMAL HIGH (ref 1.7–7.7)
Neutrophils Relative %: 88 % — ABNORMAL HIGH (ref 43–77)
Neutrophils Relative %: 86 % — ABNORMAL HIGH (ref 43–77)
Promyelocytes Absolute: 0 %
RBC: 4.18 MIL/uL (ref 3.87–5.11)
RBC: 3.84 MIL/uL — ABNORMAL LOW (ref 3.87–5.11)
WBC: 19.1 10*3/uL — ABNORMAL HIGH (ref 4.0–10.5)

## 2012-05-28 LAB — URINALYSIS, ROUTINE W REFLEX MICROSCOPIC
Bilirubin Urine: NEGATIVE
Glucose, UA: NEGATIVE mg/dL
Protein, ur: NEGATIVE mg/dL

## 2012-05-28 LAB — LIPID PANEL
Cholesterol: 142 mg/dL (ref 0–200)
Total CHOL/HDL Ratio: 2.2 RATIO
VLDL: 10 mg/dL (ref 0–40)

## 2012-05-28 LAB — GLUCOSE, CAPILLARY

## 2012-05-28 LAB — BASIC METABOLIC PANEL
Chloride: 104 mEq/L (ref 96–112)
GFR calc Af Amer: >90 mL/min (ref >90)
GFR calc non Af Amer: >90 mL/min (ref >90)
Potassium: 2.7 mEq/L — CL (ref 3.5–5.1)
Sodium: 139 mEq/L (ref 135–145)

## 2012-05-28 LAB — URINE MICROSCOPIC-ADD ON

## 2012-05-28 LAB — MAGNESIUM: Magnesium: 2 mg/dL (ref 1.5–2.5)

## 2012-05-28 MED ORDER — METOPROLOL TARTRATE 25 MG PO TABS
25.0000 mg | ORAL_TABLET | Freq: Two times a day (BID) | ORAL | Status: DC
  Administered 2012-05-28 – 2012-05-31 (×7): 25 mg via ORAL
  Filled 2012-05-28 (×8): qty 1

## 2012-05-28 MED ORDER — SODIUM CHLORIDE 0.9 % IV BOLUS (SEPSIS)
500.0000 mL | Freq: Once | INTRAVENOUS | Status: AC
  Administered 2012-05-28: 500 mL via INTRAVENOUS

## 2012-05-28 MED ORDER — ASPIRIN EC 325 MG PO TBEC
325.0000 mg | DELAYED_RELEASE_TABLET | Freq: Every day | ORAL | Status: DC
  Administered 2012-05-28 – 2012-05-31 (×4): 325 mg via ORAL
  Filled 2012-05-28 (×5): qty 1

## 2012-05-28 MED ORDER — ENOXAPARIN SODIUM 40 MG/0.4ML ~~LOC~~ SOLN
40.0000 mg | SUBCUTANEOUS | Status: DC
  Administered 2012-05-28 – 2012-05-31 (×4): 40 mg via SUBCUTANEOUS
  Filled 2012-05-28 (×4): qty 0.4

## 2012-05-28 MED ORDER — ACETAMINOPHEN 650 MG RE SUPP: 650.0000 mg | RECTAL | Status: DC | PRN

## 2012-05-28 MED ORDER — INSULIN ASPART 100 UNIT/ML ~~LOC~~ SOLN: 0.0000 [IU] | Freq: Every day | SUBCUTANEOUS | Status: DC

## 2012-05-28 MED ORDER — ONDANSETRON HCL 4 MG/2ML IJ SOLN: 4.0000 mg | Freq: Four times a day (QID) | INTRAMUSCULAR | Status: DC | PRN

## 2012-05-28 MED ORDER — POTASSIUM CHLORIDE CRYS ER 20 MEQ PO TBCR
40.0000 meq | EXTENDED_RELEASE_TABLET | ORAL | Status: AC
  Administered 2012-05-28 (×2): 40 meq via ORAL
  Filled 2012-05-28 (×2): qty 2

## 2012-05-28 MED ORDER — ACETAMINOPHEN 325 MG PO TABS: 650.0000 mg | ORAL_TABLET | ORAL | Status: DC | PRN

## 2012-05-28 MED ORDER — SENNOSIDES-DOCUSATE SODIUM 8.6-50 MG PO TABS: 1.0000 | ORAL_TABLET | Freq: Every evening | ORAL | Status: DC | PRN

## 2012-05-28 MED ORDER — INSULIN ASPART 100 UNIT/ML ~~LOC~~ SOLN
0.0000 [IU] | Freq: Three times a day (TID) | SUBCUTANEOUS | Status: DC
  Administered 2012-05-29 – 2012-05-31 (×3): 1 [IU] via SUBCUTANEOUS

## 2012-05-28 MED ORDER — SODIUM CHLORIDE 0.9 % IV SOLN
INTRAVENOUS | Status: AC
  Administered 2012-05-28 (×2): via INTRAVENOUS

## 2012-05-28 MED ORDER — ENSURE COMPLETE PO LIQD
237.0000 mL | Freq: Two times a day (BID) | ORAL | Status: DC
  Administered 2012-05-28 – 2012-05-31 (×5): 237 mL via ORAL

## 2012-05-28 MED ORDER — POTASSIUM CHLORIDE CRYS ER 20 MEQ PO TBCR
40.0000 meq | EXTENDED_RELEASE_TABLET | Freq: Once | ORAL | Status: AC
  Administered 2012-05-28: 40 meq via ORAL
  Filled 2012-05-28: qty 2

## 2012-05-28 MED ORDER — HYDROCHLOROTHIAZIDE 25 MG PO TABS
25.0000 mg | ORAL_TABLET | Freq: Every day | ORAL | Status: DC
  Administered 2012-05-28 – 2012-05-31 (×4): 25 mg via ORAL
  Filled 2012-05-28 (×4): qty 1

## 2012-05-28 NOTE — Progress Notes (Signed)
Subjective: Patient reports that she is at baseline today.  Is able to ambulate with assistance which she reports is her baseline.  MRI of the brain reviewed and shows no evidence of an acute infarct.  She is unclear today on questioning about her right leg weakness.  Although per yesterday's notes it was of acute onset the patient can not tell me when her symptoms started and how long they lasted.  Unclear if this may have been a TIA.  Patient on no antiplatelet therapy at home.    Objective: Current vital signs: BP 160/93  Pulse 94  Temp(Src) 99.2 F (37.3 C) (Oral)  Resp 16  Ht 5\' 1"  (1.549 m)  Wt 52.7 kg (116 lb 2.9 oz)  BMI 21.96 kg/m2  SpO2 100%  LMP 02/27/2012 Vital signs in last 24 hours: Temp:  [98.9 F (37.2 C)-99.2 F (37.3 C)] 99.2 F (37.3 C) (05/07 0739) Pulse Rate:  [85-121] 94 (05/07 0739) Resp:  [13-23] 16 (05/07 0739) BP: (135-173)/(63-116) 160/93 mmHg (05/07 0739) SpO2:  [100 %] 100 % (05/07 0739) Weight:  [52.7 kg (116 lb 2.9 oz)] 52.7 kg (116 lb 2.9 oz) (05/07 0739)  Intake/Output from previous day:   Intake/Output this shift:   Nutritional status: Carb Control  Neurologic Exam: Mental Status: Alert, oriented, thought content appropriate.  Speech fluent without evidence of aphasia.  Able to follow 3 step commands without difficulty. Cranial Nerves: II: Discs flat bilaterally; Visual fields grossly normal, pupils equal, round, reactive to light and accommodation III,IV, VI: ptosis not present, extra-ocular motions intact bilaterally V,VII: smile symmetric, facial light touch sensation normal bilaterally VIII: hearing normal bilaterally IX,X: gag reflex present XI: bilateral shoulder shrug XII: midline tongue extension Motor: Right : Upper extremity   5/5    Left:     Upper extremity   5/5  Lower extremity   5/5     Lower extremity   5/5 Tone and bulk:normal tone throughout; no atrophy noted Sensory: Pinprick and light touch intact throughout,  bilaterally Deep Tendon Reflexes: 3+ and symmetric throughout Cerebellar: Tremor in the upper extremities.  Slight ataxia on the left.   Gait: requires assistance with some magnetic features noted with the LLE CV: pulses palpable throughout    Lab Results: Basic Metabolic Panel:  Recent Labs Lab 05/27/12 2343 05/28/12 0426  NA 141  --   K 3.0*  --   CL 102  --   CO2 25  --   GLUCOSE 128*  --   BUN 7  --   CREATININE 0.50  --   CALCIUM 10.2  --   MG  --  2.0    Liver Function Tests:  Recent Labs Lab 05/27/12 2343  AST 21  ALT 10  ALKPHOS 85  BILITOT 1.9*  PROT 8.4*  ALBUMIN 4.6   No results found for this basename: LIPASE, AMYLASE,  in the last 168 hours No results found for this basename: AMMONIA,  in the last 168 hours  CBC:  Recent Labs Lab 05/27/12 2343  WBC 22.9*  NEUTROABS 20.2*  HGB 13.8  HCT 34.9*  MCV 83.5  PLT 216    Cardiac Enzymes: No results found for this basename: CKTOTAL, CKMB, CKMBINDEX, TROPONINI,  in the last 168 hours  Lipid Panel: No results found for this basename: CHOL, TRIG, HDL, CHOLHDL, VLDL, LDLCALC,  in the last 168 hours  CBG:  Recent Labs Lab 05/27/12 2347  GLUCAP 134*    Microbiology: No results found for this  or any previous visit.  Coagulation Studies: No results found for this basename: LABPROT, INR,  in the last 72 hours  Imaging: Ct Head Wo Contrast  05/28/2012  *RADIOLOGY REPORT*  Clinical Data: Fall, altered mental status  CT HEAD WITHOUT CONTRAST  Technique:  Contiguous axial images were obtained from the base of the skull through the vertex without contrast.  Comparison: Brain MRI 03/18/2010  Findings: No acute intracranial hemorrhage.  No focal mass lesion. No CT evidence of acute infarction.   No midline shift or mass effect.  No hydrocephalus.  Basilar cisterns are patent.  There is extensive white matter hypodensity involving white matter tracts of the cerebellum and cerebellar hemispheres.  Findings  are unchanged from comparison MRI.  Paranasal sinuses and mastoid air cells are clear.  Orbits are normal.  IMPRESSION:  1.  No acute intercranial findings. 2.  Extensive white matter disease is consistent with the patient's diagnosis of multiple sclerosis.   Original Report Authenticated By: Genevive Bi, M.D.    Dg Chest Port 1 View  05/28/2012  *RADIOLOGY REPORT*  Clinical Data: 42 year old female altered mental status vomiting dizzy shortness of breath.  PORTABLE CHEST - 1 VIEW  Comparison: 03/15/2010 and earlier.  Findings: Portable semi upright AP view 2358 hours.  Cardiac size and mediastinal contours are within normal limits.  Visualized tracheal air column is within normal limits.  Lung volumes within normal limits.  Allowing for portable technique, the lungs are clear.  No pneumothorax or effusion.  IMPRESSION: Negative, no acute cardiopulmonary abnormality.   Original Report Authenticated By: Erskine Speed, M.D.     Medications:  I have reviewed the patient's current medications. Scheduled: . aspirin EC  325 mg Oral Daily  . enoxaparin (LOVENOX) injection  40 mg Subcutaneous Q24H  . hydrochlorothiazide  25 mg Oral Daily  . insulin aspart  0-5 Units Subcutaneous QHS  . insulin aspart  0-9 Units Subcutaneous TID WC  . metoprolol tartrate  25 mg Oral BID    Assessment/Plan: 42 year old female with MS.  Presented with RLE weakness.  Now seems to be at baseline.  Unclear if she may have had a TIA.  Patient with multiple risk factors.  Does not seem to be having an MS exacerbation.    Recommendations: 1.  Echocardiogram 2.  Carotid duplex 3.  Agree with ASA.  Would continue at discharge.   4.  PT to evaluate.   5.  Patient should follow up with a neurologist as an outpatient to consider disease modifying therapy.   6.  BP control   LOS: 1 day   Thana Farr, MD [facility] [phone] 05/28/2012  8:36 AM

## 2012-05-28 NOTE — ED Notes (Addendum)
Back from MRI via stretcher. 4N notified Pt. Will be on the way.

## 2012-05-28 NOTE — Evaluation (Signed)
Speech Language Pathology Evaluation Patient Details Name: [name] [name] MRN: [id number] DOB: 24-May-1970 Today's Date: 05/28/2012 Time: 1000-1011 SLP Time Calculation (min): 11 min  Problem List:  Patient Active Problem List   Diagnosis Date Noted  . Right leg weakness 05/28/2012  . Fall 05/28/2012  . Leukocytosis 05/28/2012  . Hypokalemia 05/28/2012  . BENIGN POSITIONAL VERTIGO 01/27/2010  . CARPAL TUNNEL SYNDROME, LEFT 04/14/2009  . SHOULDER STRAIN, LEFT 03/03/2009  . DIABETES MELLITUS, TYPE II 09/30/2006  . DYSLIPIDEMIA 09/30/2006  . TOBACCO USER 09/30/2006  . HYPERTENSION 09/30/2006  . Multiple sclerosis 12/05/2005   Past Medical History:  Past Medical History  Diagnosis Date  . Multiple sclerosis   . HYPERTENSION 09/30/2006    Qualifier: Diagnosis of  By: Delrae Alfred MD, Lanora Manis    . DIABETES MELLITUS, TYPE II 09/30/2006    Qualifier: Diagnosis of  By: Delrae Alfred MD, Lanora Manis     Past Surgical History: History reviewed. No pertinent past surgical history. HPI:  [name] [name] is a 42 y.o. female a history of multiple sclerosis diagnosed at age 14 presents with right leg weakness started earlier today. Her son also notes that her speech sounds more slurred than typically does appear she denies arm symptoms, or face symptoms. She states that it came on suddenly, and has been persistent since started. CT negative for acute event.   Assessment / Plan / Recommendation Clinical Impression  Pt presents with resolved speech deficits; cognition/communication at baseline.  No SLP f/u warranted.  Swallow WNL.    SLP Assessment  Patient does not need any further Speech Lanaguage Pathology Services       SLP Evaluation Prior Functioning  Cognitive/Linguistic Baseline: Within functional limits Type of Home: Other (Comment) (second story apartment) Lives With: Family;Other (Comment) (two sons (19, 25)) Available Help at Discharge: Family -sons and one of the girlfriends do [employer]  shopping, meal prep, and house-cleaning Vocation: Other (comment) (sedentary lifestyle.  Watches tv most of the day.)   Cognition  Overall Cognitive Status: Within Functional Limits for tasks assessed Arousal/Alertness: Awake/alert Orientation Level: Oriented X4 Memory: Appears intact Problem Solving: Appears intact Safety/Judgment: Appears intact    Comprehension  Auditory Comprehension Overall Auditory Comprehension: Appears within functional limits for tasks assessed Visual Recognition/Discrimination Discrimination: Within Function Limits Reading Comprehension Reading Status: Within funtional limits    Expression Expression Primary Mode of Expression: Verbal Verbal Expression Overall Verbal Expression: Appears within functional limits for tasks assessed   Oral / Motor Oral Motor/Sensory Function Overall Oral Motor/Sensory Function: Appears within functional limits for tasks assessed Motor Speech Overall Motor Speech: Appears within functional limits for tasks assessed   GO    Debralee Braaksma L. Samson Frederic, [place] CCC/SLP Pager [phone]  Blenda Mounts Laurice 05/28/2012, 10:16 AM

## 2012-05-28 NOTE — H&P (Signed)
Patient's PCP: Collene Schlichter  Chief Complaint: Fall and right leg weakness  History of Present Illness: [name] [name] is a 42 y.o. African American  female with history of hypertension, diabetes, and multiple sclerosis who presents with the above complaints. Patient reports that she recently completed a course of steroids last month prescribed by her primary care physician for unclear etiology.she was last seen by her son around 3 p.m. On 05/27/2012.  Patient reports that around 330 p.m. She was in the bathroom and somebody knocked on her door as she made her way to the door, she her right leg give out and she fell to the floor.  She denies losing consciousness or hitting her head.  She normally uses a walker at home to get by.  Her son found her, patient is not sure at what time.  She was brought to the emergency department for further evaluation.  Patient was evaluated by neurology and recommended admission for further care and management.  Patient denies any recent fevers, chills, nausea, vomiting, chest pain, shortness of breath, abdominal pain, diarrhea, headaches or vision changes.  Review of Systems: All systems reviewed with the patient and positive as per history of present illness, otherwise all other systems are negative.  Past Medical History  Diagnosis Date  . Multiple sclerosis   . HYPERTENSION 09/30/2006    Qualifier: Diagnosis of  By: Delrae Alfred MD, Lanora Manis    . DIABETES MELLITUS, TYPE II 09/30/2006    Qualifier: Diagnosis of  By: Delrae Alfred MD, Lanora Manis     History reviewed. No pertinent past surgical history. Family History  Problem Relation Age of Onset  . Hypertension Mother   . Kidney failure Mother   . Hypertension Father   . Gout Father   . Hypertension Sister   . Diabetes Sister   . Hypertension Brother    History   Social History  . Marital Status: Single    Spouse Name: N/A    Number of Children: N/A  . Years of Education: N/A   Occupational History  .  Not on file.   Social History Main Topics  . Smoking status: Current Every Day Smoker -- 0.25 packs/day  . Smokeless tobacco: Not on file  . Alcohol Use: No  . Drug Use: Not on file  . Sexually Active: Not on file   Other Topics Concern  . Not on file   Social History Narrative  . No narrative on file   Allergies: Review of patient's allergies indicates no known allergies.  Home Meds: Prior to Admission medications   Medication Sig Start Date End Date Taking? Authorizing Provider  hydrochlorothiazide (HYDRODIURIL) 25 MG tablet Take 25 mg by mouth daily.   Yes Historical Provider, MD  metFORMIN (GLUCOPHAGE) 500 MG tablet Take 500 mg by mouth daily with breakfast.   Yes Historical Provider, MD  metoprolol tartrate (LOPRESSOR) 25 MG tablet Take 25 mg by mouth 2 (two) times daily.   Yes Historical Provider, MD    Physical Exam: Blood pressure 161/99, pulse 103, temperature 98.9 F (37.2 C), temperature source Oral, resp. rate 21, last menstrual period 02/27/2012, SpO2 100.00%. General: Awake, Oriented x3, No acute distress. HEENT: EOMI, Moist mucous membranes Neck: Supple CV: S1 and S2 Lungs: Clear to ascultation bilaterally Abdomen: Soft, Nontender, Nondistended, +bowel sounds. Ext: Good pulses. Trace edema. No clubbing or cyanosis noted. Neuro: Cranial Nerves II-XII grossly intact. Has 5/5 motor strength in upper extremities bilaterally, 4/5 motor strength in right lower extremity, 5/5 motor strength  in left lower extremity.  Lab results:  Recent Labs  05/27/12 2343  NA 141  K 3.0*  CL 102  CO2 25  GLUCOSE 128*  BUN 7  CREATININE 0.50  CALCIUM 10.2    Recent Labs  05/27/12 2343  AST 21  ALT 10  ALKPHOS 85  BILITOT 1.9*  PROT 8.4*  ALBUMIN 4.6   No results found for this basename: LIPASE, AMYLASE,  in the last 72 hours  Recent Labs  05/27/12 2343  WBC 22.9*  NEUTROABS 20.2*  HGB 13.8  HCT 34.9*  MCV 83.5  PLT 216   No results found for this  basename: CKTOTAL, CKMB, CKMBINDEX, TROPONINI,  in the last 72 hours No components found with this basename: POCBNP,  No results found for this basename: DDIMER,  in the last 72 hours No results found for this basename: HGBA1C,  in the last 72 hours No results found for this basename: CHOL, HDL, LDLCALC, TRIG, CHOLHDL, LDLDIRECT,  in the last 72 hours No results found for this basename: TSH, T4TOTAL, FREET3, T3FREE, THYROIDAB,  in the last 72 hours No results found for this basename: VITAMINB12, FOLATE, FERRITIN, TIBC, IRON, RETICCTPCT,  in the last 72 hours Imaging results:  Ct Head Wo Contrast  05/28/2012  *RADIOLOGY REPORT*  Clinical Data: Fall, altered mental status  CT HEAD WITHOUT CONTRAST  Technique:  Contiguous axial images were obtained from the base of the skull through the vertex without contrast.  Comparison: Brain MRI 03/18/2010  Findings: No acute intracranial hemorrhage.  No focal mass lesion. No CT evidence of acute infarction.   No midline shift or mass effect.  No hydrocephalus.  Basilar cisterns are patent.  There is extensive white matter hypodensity involving white matter tracts of the cerebellum and cerebellar hemispheres.  Findings are unchanged from comparison MRI.  Paranasal sinuses and mastoid air cells are clear.  Orbits are normal.  IMPRESSION:  1.  No acute intercranial findings. 2.  Extensive white matter disease is consistent with the patient's diagnosis of multiple sclerosis.   Original Report Authenticated By: Genevive Bi, M.D.    Dg Chest Port 1 View  05/28/2012  *RADIOLOGY REPORT*  Clinical Data: 42 year old female altered mental status vomiting dizzy shortness of breath.  PORTABLE CHEST - 1 VIEW  Comparison: 03/15/2010 and earlier.  Findings: Portable semi upright AP view 2358 hours.  Cardiac size and mediastinal contours are within normal limits.  Visualized tracheal air column is within normal limits.  Lung volumes within normal limits.  Allowing for portable  technique, the lungs are clear.  No pneumothorax or effusion.  IMPRESSION: Negative, no acute cardiopulmonary abnormality.   Original Report Authenticated By: Erskine Speed, M.D.    Other results: EKG: sinus tachycardia with heart rate of 116.  Assessment & Plan by Problem: Fall/right leg weakness Etiology unclear.  Neurology has evaluated the patient.  As per neurology recommendation will do MRI of the brain and C-spine without contrast. If MRI is negative for any stroke, to be started on Solu-Medrol 500 mg IV twice daily for possible multiple sclerosis exacerbation. Patient started on aspirin 325 mg daily.  Continue neuro checks. Continue PT/OT.  Leukocytosis Etiology unclear. Patient indicates that she recently completed steroids (not sure, will need to be confirmed by contacting primary care physician's office in the morning). Infectious workup is negative with negative urine analysis and chest x-ray.  Hypokalemia Replace as needed.  Tobacco abuse Patient smokes one to 2 cigarettes per day. Counseled on cessation.  Diabetes type 2 Sliding scale insulin.  History of multiple sclerosis Management as indicated above.  Prophylaxis Lovenox  CODE STATUS Full code  Disposition Admit the patient as inpatient to telemetry.  Time spent on admission, talking to the patient, and coordinating care was: 60 mins.  Betzabe Bevans A, MD 05/28/2012, 3:24 AM

## 2012-05-28 NOTE — ED Notes (Signed)
Patient transported to MRI 

## 2012-05-28 NOTE — Consult Note (Signed)
Reason for Consult: Right leg weakness Referring Physician: Rulon Abide, J  CC: Right leg weakness  History is obtained from: Patient, son  HPI: [name] [name] is a 42 y.o. female a history of multiple sclerosis diagnosed at age 64 presents with right leg weakness started earlier today. Her son also notes that her speech sounds more slurred than typically does appear she denies arm symptoms, or face symptoms. She states that it came on suddenly, and has been persistent since started.   LKW: 3 PM tpa given: no, outside of window    ROS: A 14 point ROS was performed and is negative except as noted in the HPI.  Past Medical History  Diagnosis Date  . Multiple sclerosis    hypertension Diabetes Multiple sclerosis Optic neuritis   Family History: No history of MS Father-stroke  Social History: Tob: Smokes  Exam: Current vital signs: BP 154/116  Pulse 114  Temp(Src) 99 F (37.2 C) (Oral)  Resp 23  SpO2 100%  LMP 02/27/2012 Vital signs in last 24 hours: Temp:  [99 F (37.2 C)] 99 F (37.2 C) (05/06 2326) Pulse Rate:  [114-117] 114 (05/07 0000) Resp:  [16-23] 23 (05/07 0000) BP: (154-155)/(86-116) 154/116 mmHg (05/07 0000) SpO2:  [100 %] 100 % (05/07 0000)  General: In bed, NAD CV: Regular rate and rhythm Mental Status: Patient is awake, alert, oriented to person, place, month, gives year as 2013 Able to spell world backwards without difficulty Unable to give # of quarters in $2.75(answered 8 plus 75) Unable to spell world backwards No sign of aphasia or neglect +dysarthria Cranial Nerves: II: Visual Fields are full. Pupils are equal, round, and reactive to light.  Discs are without papilledema. III,IV, VI: EOMI without ptosis or diploplia.  V: Facial sensation is symmetric to temperature VII: Facial movement is symmetric.  VIII: hearing is intact to voice X: Uvula elevates symmetrically XI: Shoulder shrug is symmetric. XII: tongue is midline without atrophy or  fasciculations.  Motor: Tone is normal. Bulk is normal. 5/5 strength was present in left arm and leg. Her right leg has mild hip flexor and plantarflexion weakness, her right arm is intact to confrontation, but does have a mild pronator drift.  Sensory: Sensation is symmetric to light touch in the arms and legs. Deep Tendon Reflexes: 3+ and symmetric in the biceps and patellae.  Cerebellar: FNF with marked tremor bilaterally, left seems slightly more ataxic than right. HKS also performs worse on left than right.  Gait: Not tested due to patient safety concerns.   I have reviewed labs in epic and the results pertinent to this consultation are: CBG 134   I have reviewed the images obtained:MRI brain 2012 - [employer] extensive white matter changes.  Impression: 42 yo F with MS and new right leg weakness. The acuity of the presentation does call into question whether CVA could be responsible and she does have risk factors, but I feel MS more likely. I would obtain MRI of her brain and if no CVA is seen, start high dose steroids.   Recommendations: 1) MRI brain and C-Spine with and without contrast.  2) If no CVA seen, start solumedrol 500mg  IV BID.    Ritta Slot, MD [facility] [phone]  If 7pm- 7am, please page neurology on call at [phone].

## 2012-05-28 NOTE — Progress Notes (Signed)
I have seen and assessed patient and agree with Dr Reddy's assessment and plan. Neurology ff and appreciate input and rxcs.

## 2012-05-28 NOTE — Progress Notes (Signed)
INITIAL NUTRITION ASSESSMENT  DOCUMENTATION CODES Per approved criteria  -Not Applicable   INTERVENTION: Ensure Complete po BID, each supplement provides 350 kcal and 13 grams of protein.  NUTRITION DIAGNOSIS: Inadequate oral intake related to decreased appetite as evidenced by meal completion < 25%.   Goal: Pt to meet >/= 90% of their estimated nutrition needs.   Monitor:  PO intake, supplement acceptance, weight trend, labs  Reason for Assessment: Pt identified as at nutrition risk on the Malnutrition Screen Tool  42 y.o. female  Admitting Dx: Fall  ASSESSMENT: Pt admitted with RLE weakness which has resolved. Possible TIA per MD. Pt with hx of MS.  Per pt she "used to be a big girl" but is unable to clarify what her usual weight was and how much weight she lost, or when her weight loss started. Pt did not eat Breakfast this am states she is not hungry. Pt is willing to try ensure.  Per records pt has lost 31% of her body weight in the last 2 years.   Nutrition Focused Physical Exam:  Subcutaneous Fat:  Orbital Region: WNL Upper Arm Region: WNL Thoracic and Lumbar Region: WNL  Muscle:  Temple Region: WNL Clavicle Bone Region: WNL Clavicle and Acromion Bone Region: WNL Scapular Bone Region: WNL Dorsal Hand: WNL Patellar Region: WNL Anterior Thigh Region: WNL Posterior Calf Region: WNL  Edema: not present   Height: Ht Readings from Last 1 Encounters:  05/28/12 5\' 1"  (1.549 m)    Weight: Wt Readings from Last 1 Encounters:  05/28/12 116 lb 2.9 oz (52.7 kg)    Ideal Body Weight: 47.7 kg  % Ideal Body Weight: 110%  Wt Readings from Last 10 Encounters:  05/28/12 116 lb 2.9 oz (52.7 kg)  01/27/10 167 lb 5 oz (75.892 kg)  06/16/09 180 lb (81.647 kg)  04/14/09 183 lb 3 oz (83.093 kg)  03/16/09 188 lb (85.276 kg)  03/03/09 184 lb (83.462 kg)  12/02/08 191 lb 5 oz (86.779 kg)  10/09/07 189 lb (85.73 kg)    Usual Body Weight: unknown  % Usual Body  Weight: -  BMI:  Body mass index is 21.96 kg/(m^2).  Estimated Nutritional Needs: Kcal: 1600-1750 Protein: 70-85 grams Fluid: > 1.5 L/day  Skin: no issues noted  Diet Order: Carb Control  EDUCATION NEEDS: -No education needs identified at this time  No intake or output data in the 24 hours ending 05/28/12 0940  Last BM: PTA   Labs:   Recent Labs Lab 05/27/12 2343 05/28/12 0426  NA 141  --   K 3.0*  --   CL 102  --   CO2 25  --   BUN 7  --   CREATININE 0.50  --   CALCIUM 10.2  --   MG  --  2.0  GLUCOSE 128*  --     CBG (last 3)   Recent Labs  05/27/12 2347  GLUCAP 134*    Scheduled Meds: . aspirin EC  325 mg Oral Daily  . enoxaparin (LOVENOX) injection  40 mg Subcutaneous Q24H  . hydrochlorothiazide  25 mg Oral Daily  . insulin aspart  0-5 Units Subcutaneous QHS  . insulin aspart  0-9 Units Subcutaneous TID WC  . metoprolol tartrate  25 mg Oral BID    Continuous Infusions: . sodium chloride 50 mL/hr at 05/28/12 0981    Past Medical History  Diagnosis Date  . Multiple sclerosis   . HYPERTENSION 09/30/2006    Qualifier: Diagnosis of  ByDelrae Alfred MD, Lanora Manis    . DIABETES MELLITUS, TYPE II 09/30/2006    Qualifier: Diagnosis of  By: Delrae Alfred MD, Lanora Manis      History reviewed. No pertinent past surgical history.  Kendell Bane RD, LDN, CNSC [phone] Pager [phone] After Hours Pager

## 2012-05-29 DIAGNOSIS — W19XXXA Unspecified fall, initial encounter: Secondary | ICD-10-CM

## 2012-05-29 DIAGNOSIS — G35 Multiple sclerosis: Secondary | ICD-10-CM

## 2012-05-29 DIAGNOSIS — F17200 Nicotine dependence, unspecified, uncomplicated: Secondary | ICD-10-CM

## 2012-05-29 HISTORY — DX: Multiple sclerosis: G35

## 2012-05-29 LAB — CBC WITH DIFFERENTIAL/PLATELET
Basophils Relative: 0 % (ref 0–1)
Eosinophils Absolute: 0.2 10*3/uL (ref 0.0–0.7)
Eosinophils Relative: 1 % (ref 0–5)
HCT: 33 % — ABNORMAL LOW (ref 36.0–46.0)
Hemoglobin: 12.2 g/dL (ref 12.0–15.0)
MCH: 32.3 pg (ref 26.0–34.0)
MCHC: 37 g/dL — ABNORMAL HIGH (ref 30.0–36.0)
Monocytes Absolute: 0.8 10*3/uL (ref 0.1–1.0)
Monocytes Relative: 6 % (ref 3–12)
Neutrophils Relative %: 79 % — ABNORMAL HIGH (ref 43–77)

## 2012-05-29 LAB — BASIC METABOLIC PANEL
BUN: 6 mg/dL (ref 6–23)
Calcium: 9.5 mg/dL (ref 8.4–10.5)
GFR calc non Af Amer: >90 mL/min (ref >90)
GFR calc non Af Amer: >90 mL/min (ref >90)
Glucose, Bld: 88 mg/dL (ref 70–99)
Potassium: 3.9 mEq/L (ref 3.5–5.1)
Sodium: 138 mEq/L (ref 135–145)

## 2012-05-29 LAB — GLUCOSE, CAPILLARY
Glucose-Capillary: 87 mg/dL (ref 70–99)
Glucose-Capillary: 126 mg/dL — ABNORMAL HIGH (ref 70–99)

## 2012-05-29 MED ORDER — POTASSIUM CHLORIDE CRYS ER 20 MEQ PO TBCR
40.0000 meq | EXTENDED_RELEASE_TABLET | Freq: Two times a day (BID) | ORAL | Status: AC
  Administered 2012-05-29 (×2): 40 meq via ORAL
  Filled 2012-05-29 (×2): qty 2

## 2012-05-29 NOTE — Progress Notes (Signed)
[facility] PROGRESS NOTE  Assessment/Plan:  Right leg weakness/Fall most likely due to Multiple sclerosis: - Unclear etiology - MRI 5.7.2014: Interval mild progression of the significant fairly confluent white  matter type changes - ct head 5.7.2017: No acute intercranial findings. - PT consult - ASA  Leukocytosis: - most likely due to steroids. - has remained afebrile.  Hypokalemia - replete, recheck.   DIABETES MELLITUS, TYPE II - HbgA1c 5.0 unlikely a DM.  HYPERTENSION - excellent control.      Code Status: full Family Communication: none  Disposition Plan: inpatient   Consultants:  NEuro  Procedures:  MRI 5.7.2014  CT head 5.6.2014  CXR  MRI c-spine 5.7.2014  Antibiotics:  none  HPI/Subjective: No complains. She relates her weakness is better.  Objective: Filed Vitals:   05/28/12 1835 05/28/12 2109 05/29/12 0200 05/29/12 0550  BP: 137/90 132/88 99/75 123/87  Pulse: 92 92 79 73  Temp: 98.4 F (36.9 C) 98.8 F (37.1 C) 98.6 F (37 C) 97.9 F (36.6 C)  TempSrc:  Oral Oral Oral  Resp: 20 18 18 18   Height:      Weight:      SpO2: 100% 100% 100% 100%    Intake/Output Summary (Last 24 hours) at 05/29/12 0849 Last data filed at 05/28/12 1838  Gross per 24 hour  Intake    240 ml  Output      0 ml  Net    240 ml   Filed Weights   05/28/12 0739  Weight: 52.7 kg (116 lb 2.9 oz)    Exam:  General: Alert, awake, oriented x3, in no acute distress.  HEENT: No bruits, no goiter.  Heart: Regular rate and rhythm, without murmurs, rubs, gallops.  Lungs: Good air movement, bilateral air movement.  Abdomen: Soft, nontender, nondistended, positive bowel sounds.  Neuro: right sided weakness present   Data Reviewed: Basic Metabolic Panel:  Recent Labs Lab 05/27/12 2343 05/28/12 0426 05/28/12 1000  NA 141  --  139  K 3.0*  --  2.7*  CL 102  --  104  CO2 25  --  25  GLUCOSE 128*  --  87  BUN 7  --  4*  CREATININE 0.50  --   0.44*  CALCIUM 10.2  --  9.1  MG  --  2.0  --    Liver Function Tests:  Recent Labs Lab 05/27/12 2343  AST 21  ALT 10  ALKPHOS 85  BILITOT 1.9*  PROT 8.4*  ALBUMIN 4.6   No results found for this basename: LIPASE, AMYLASE,  in the last 168 hours No results found for this basename: AMMONIA,  in the last 168 hours CBC:  Recent Labs Lab 05/27/12 2343 05/28/12 1000 05/29/12 0636  WBC 22.9* 19.1* 14.0*  NEUTROABS 20.2* 16.4* 11.1*  HGB 13.8 12.2 12.2  HCT 34.9* 33.0* 33.0*  MCV 83.5 85.9 87.3  PLT 216 212 205   Cardiac Enzymes: No results found for this basename: CKTOTAL, CKMB, CKMBINDEX, TROPONINI,  in the last 168 hours BNP (last 3 results) No results found for this basename: PROBNP,  in the last 8760 hours CBG:  Recent Labs Lab 05/27/12 2347 05/28/12 1136 05/28/12 1645 05/28/12 2109 05/29/12 0654  GLUCAP 134* 111* 109* 110* 87    No results found for this or any previous visit (from the past 240 hour(s)).   Studies: Ct Head Wo Contrast  05/28/2012  *RADIOLOGY REPORT*  Clinical Data: Fall, altered mental status  CT HEAD WITHOUT CONTRAST  Technique:  Contiguous axial images were obtained from the base of the skull through the vertex without contrast.  Comparison: Brain MRI 03/18/2010  Findings: No acute intracranial hemorrhage.  No focal mass lesion. No CT evidence of acute infarction.   No midline shift or mass effect.  No hydrocephalus.  Basilar cisterns are patent.  There is extensive white matter hypodensity involving white matter tracts of the cerebellum and cerebellar hemispheres.  Findings are unchanged from comparison MRI.  Paranasal sinuses and mastoid air cells are clear.  Orbits are normal.  IMPRESSION:  1.  No acute intercranial findings. 2.  Extensive white matter disease is consistent with the patient's diagnosis of multiple sclerosis.   Original Report Authenticated By: Genevive Bi, M.D.    Mr Brain Wo Contrast  05/28/2012  *RADIOLOGY REPORT*   Clinical Data:  Right leg weakness.  History multiple sclerosis, diabetes and hypertension.  MRI HEAD WITHOUT CONTRAST MRI CERVICAL SPINE WITHOUT CONTRAST  Technique:  Multiplanar, multiecho pulse sequences of the brain and surrounding structures, and cervical spine, to include the craniocervical junction and cervicothoracic junction, were obtained without intravenous contrast.  Comparison:  05/28/2012 CT.  03/18/2010 MR brain and MR cervical spine.  MRI HEAD  Findings:  No acute infarct.  Interval mild progression of the significant fairly confluent white matter type changes involving the supratentorial and of tentorial region consistent with the patient's history of multiple sclerosis. None of these areas demonstrates restricted motion as may be seen with areas of active demyelination.  Contrast not administered to evaluate for enhancing plaque.  Global atrophy without hydrocephalus.  No intracranial mass lesion detected on this unenhanced exam.  Major intracranial vascular structures are patent.  No intracranial hemorrhage.  Mucosal thickening paranasal sinuses greater on the right.  Orbital structures grossly intact.  Cervical medullary junction, pituitary region and pineal region unremarkable.  IMPRESSION: Interval mild progression of the significant fairly confluent white matter type changes involving the supratentorial and of tentorial region consistent with the patient's history of multiple sclerosis.  MRI CERVICAL SPINE  Findings: Motion degraded exam.  Minimal change in degree of low-level increased signal within the cord from the C1 through C5 level. Findings consistent with result of multiple sclerosis.  Several thyroid lesions largest lower isthmus measuring up to 2 cm appears slightly larger than on prior exam.  These can be followed with thyroid ultrasound.  C2-3:  No significant spinal stenosis or foraminal narrowing.  C3-4:  Shallow protrusion with mild spinal stenosis and minimal cord contact.   C4-5:  Bulge/shallow protrusion.  Minimal cord contact.  C5-6:  Bulge/shallow protrusion slightly greater left paracentral position with minimal cord contact.  C6-7:  Minimal bulge.  C7- T1:  Negative.  IMPRESSION: Minimal change in degree of low-level increased signal within the cord from the C1 through C5 level. Findings consistent with result of multiple sclerosis.  Minimal changes in cervical spondylotic changes as noted above.  Several thyroid lesions largest lower isthmus measuring up to 2 cm appears slightly larger than on prior exam.  These can be followed with thyroid ultrasound.   Original Report Authenticated By: Lacy Duverney, M.D.    Mr Cervical Spine Wo Contrast  05/28/2012  *RADIOLOGY REPORT*  Clinical Data:  Right leg weakness.  History multiple sclerosis, diabetes and hypertension.  MRI HEAD WITHOUT CONTRAST MRI CERVICAL SPINE WITHOUT CONTRAST  Technique:  Multiplanar, multiecho pulse sequences of the brain and surrounding structures, and cervical spine, to include the craniocervical junction and cervicothoracic junction, were obtained without  intravenous contrast.  Comparison:  05/28/2012 CT.  03/18/2010 MR brain and MR cervical spine.  MRI HEAD  Findings:  No acute infarct.  Interval mild progression of the significant fairly confluent white matter type changes involving the supratentorial and of tentorial region consistent with the patient's history of multiple sclerosis. None of these areas demonstrates restricted motion as may be seen with areas of active demyelination.  Contrast not administered to evaluate for enhancing plaque.  Global atrophy without hydrocephalus.  No intracranial mass lesion detected on this unenhanced exam.  Major intracranial vascular structures are patent.  No intracranial hemorrhage.  Mucosal thickening paranasal sinuses greater on the right.  Orbital structures grossly intact.  Cervical medullary junction, pituitary region and pineal region unremarkable.  IMPRESSION:  Interval mild progression of the significant fairly confluent white matter type changes involving the supratentorial and of tentorial region consistent with the patient's history of multiple sclerosis.  MRI CERVICAL SPINE  Findings: Motion degraded exam.  Minimal change in degree of low-level increased signal within the cord from the C1 through C5 level. Findings consistent with result of multiple sclerosis.  Several thyroid lesions largest lower isthmus measuring up to 2 cm appears slightly larger than on prior exam.  These can be followed with thyroid ultrasound.  C2-3:  No significant spinal stenosis or foraminal narrowing.  C3-4:  Shallow protrusion with mild spinal stenosis and minimal cord contact.  C4-5:  Bulge/shallow protrusion.  Minimal cord contact.  C5-6:  Bulge/shallow protrusion slightly greater left paracentral position with minimal cord contact.  C6-7:  Minimal bulge.  C7- T1:  Negative.  IMPRESSION: Minimal change in degree of low-level increased signal within the cord from the C1 through C5 level. Findings consistent with result of multiple sclerosis.  Minimal changes in cervical spondylotic changes as noted above.  Several thyroid lesions largest lower isthmus measuring up to 2 cm appears slightly larger than on prior exam.  These can be followed with thyroid ultrasound.   Original Report Authenticated By: Lacy Duverney, M.D.    Dg Chest Port 1 View  05/28/2012  *RADIOLOGY REPORT*  Clinical Data: 42 year old female altered mental status vomiting dizzy shortness of breath.  PORTABLE CHEST - 1 VIEW  Comparison: 03/15/2010 and earlier.  Findings: Portable semi upright AP view 2358 hours.  Cardiac size and mediastinal contours are within normal limits.  Visualized tracheal air column is within normal limits.  Lung volumes within normal limits.  Allowing for portable technique, the lungs are clear.  No pneumothorax or effusion.  IMPRESSION: Negative, no acute cardiopulmonary abnormality.   Original  Report Authenticated By: Erskine Speed, M.D.     Scheduled Meds: . aspirin EC  325 mg Oral Daily  . enoxaparin (LOVENOX) injection  40 mg Subcutaneous Q24H  . feeding supplement  237 mL Oral BID BM  . hydrochlorothiazide  25 mg Oral Daily  . insulin aspart  0-5 Units Subcutaneous QHS  . insulin aspart  0-9 Units Subcutaneous TID WC  . metoprolol tartrate  25 mg Oral BID   Continuous Infusions:    Marinda Elk  [facility] Pager [phone]. If 8PM-8AM, please contact night-coverage at [email or web address], password [email or web address] 05/29/2012, 8:49 AM  LOS: 2 days

## 2012-05-29 NOTE — Evaluation (Signed)
Occupational Therapy Evaluation Patient Details Name: [name] [name] MRN: [id number] DOB: January 10, 1971 Today's Date: 05/29/2012 Time: 0830-0900 OT Time Calculation (min): 30 min  OT Assessment / Plan / Recommendation Clinical Impression  Pt admitted with RLE weakness. CT negative for acute event.  Hx of MS. Pt also reports hx of  positional vertigo (onset occurring when laying flat).  Pt denied any vertigo/dizziness during session.  Overall mod I level with basic mobility and ADLs.  No further acute OT needs at this time.    OT Assessment  Patient does not need any further OT services    Follow Up Recommendations  No OT follow up    Barriers to Discharge      Equipment Recommendations  None recommended by OT    Recommendations for Other Services    Frequency       Precautions / Restrictions     Pertinent Vitals/Pain See vitals    ADL  Eating/Feeding: Performed;Independent Where Assessed - Eating/Feeding: Chair Grooming: Performed;Wash/dry hands;Independent Where Assessed - Grooming: Unsupported standing Upper Body Bathing: Simulated;Independent Where Assessed - Upper Body Bathing: Unsupported sitting Lower Body Bathing: Simulated;Modified independent Where Assessed - Lower Body Bathing: Unsupported sit to stand Upper Body Dressing: Simulated;Independent Where Assessed - Upper Body Dressing: Unsupported sitting Lower Body Dressing: Performed;Modified independent Where Assessed - Lower Body Dressing: Unsupported sit to stand Toilet Transfer: Simulated;Modified independent Toilet Transfer Method: Sit to Barista:  (bed to chair) Equipment Used:  (RW) Transfers/Ambulation Related to ADLs: mod I with RW ambulating in room ADL Comments: Pt at mod I level with ADLs/ basic functional mobility.  Denies any vertigo during session but avoided laying flat in bed since this causes the vertigo.    OT Diagnosis:    OT Problem List:   OT Treatment  Interventions:     OT Goals    Visit Information  Last OT Received On: 05/29/12    Subjective Data      Prior Functioning     Home Living Lives With: Family (2 sons) Available Help at Discharge: Family;Available 24 hours/day Type of Home: Apartment Home Access: Stairs to enter Bathroom Shower/Tub: Engineer, manufacturing systems: Standard Home Adaptive Equipment: Environmental consultant - rolling;Straight cane Additional Comments: Pt states she feels like she needs a new RW because hers is difficult to push at times. Prior Function Level of Independence: Needs assistance Needs Assistance: Meal Prep;Light Housekeeping Meal Prep: Total Light Housekeeping: Total Comments: Pt's son and his girlfriend take care of meals/groceries and housekeeping. Pt reports she watches TV most of the day,  Denies any other falls prior to the one she was admitted with. Communication Communication: No difficulties         Vision/Perception Vision - History Baseline Vision: No visual deficits   Cognition  Cognition Arousal/Alertness: Awake/alert Behavior During Therapy: WFL for tasks assessed/performed Overall Cognitive Status: Within Functional Limits for tasks assessed    Extremity/Trunk Assessment Right Upper Extremity Assessment RUE ROM/Strength/Tone: [facility] for tasks assessed Left Upper Extremity Assessment LUE ROM/Strength/Tone: WFL for tasks assessed     Mobility Bed Mobility Bed Mobility: Supine to Sit;Sitting - Scoot to Edge of Bed Supine to Sit: 6: Modified independent (Device/Increase time);HOB elevated;With rails Sitting - Scoot to Edge of Bed: 6: Modified independent (Device/Increase time) Transfers Transfers: Sit to Stand;Stand to Sit Sit to Stand: 6: Modified independent (Device/Increase time);From bed;With upper extremity assist Stand to Sit: 6: Modified independent (Device/Increase time);To chair/3-in-1;With upper extremity assist;With armrests     Exercise  Balance  Balance Balance Assessed: Yes Static Sitting Balance Static Sitting - Balance Support: Feet supported;No upper extremity supported Static Sitting - Level of Assistance: 7: Independent Dynamic Sitting Balance Dynamic Sitting - Balance Support: Feet supported;During functional activity Dynamic Sitting - Level of Assistance: 7: Independent Dynamic Sitting Balance - Compensations: donned socks sittig EOB Static Standing Balance Static Standing - Balance Support: Bilateral upper extremity supported Static Standing - Level of Assistance: 6: Modified independent (Device/Increase time) Static Standing - Comment/# of Minutes: >2 min with bil UE supported on RW   End of Session OT - End of Session Equipment Utilized During Treatment: Gait belt Activity Tolerance: Patient tolerated treatment well Patient left: in chair;with call bell/phone within reach Nurse Communication: Mobility status  GO    05/29/2012 Cipriano Mile OTR/L Pager [phone] Office [phone]  Cipriano Mile 05/29/2012, 9:17 AM

## 2012-05-29 NOTE — Progress Notes (Signed)
  Echocardiogram 2D Echocardiogram has been performed.  [name] [name] 05/29/2012, 1:56 PM

## 2012-05-29 NOTE — Evaluation (Signed)
Physical Therapy Evaluation Patient Details Name: [name] [name] MRN: [id number] DOB: Jan 30, 1970 Today's Date: 05/29/2012 Time: 1436-1500 PT Time Calculation (min): 24 min  PT Assessment / Plan / Recommendation Clinical Impression  pt admitted after fall and with R LE weakness.  Attempting to R/O TIA vs MS exacerbation.  On eval, pt still not at baseline, but R LE stronger, gait is steady , but mildly tremuloust.  Will see times 1 to address gait and mobility incl. ability to negotiate stairs.    PT Assessment  Patient needs continued PT services    Follow Up Recommendations  No PT follow up    Does the patient have the potential to tolerate intense rehabilitation      Barriers to Discharge   flight of stairs to access apt.    Equipment Recommendations  Rolling walker with 5" wheels;Other (comment) (states her walker doesn't work well)    Recommendations for [employer]     Frequency Min 1X/week    Precautions / Restrictions Precautions Precautions: Fall   Pertinent Vitals/Pain       Mobility  Bed Mobility Bed Mobility: Supine to Sit;Sitting - Scoot to Edge of Bed Supine to Sit: 6: Modified independent (Device/Increase time);HOB elevated;With rails Sitting - Scoot to Edge of Bed: 6: Modified independent (Device/Increase time) Transfers Sit to Stand: 6: Modified independent (Device/Increase time);From bed;With upper extremity assist Stand to Sit: 6: Modified independent (Device/Increase time);To chair/3-in-1;With upper extremity assist;With armrests Ambulation/Gait Ambulation/Gait Assistance: 5: Supervision Ambulation Distance (Feet): 58 Feet Assistive device: Rolling walker Ambulation/Gait Assistance Details: generally steady, mildly tremulous on R LE Gait Pattern: Step-through pattern Stairs: No    Exercises     PT Diagnosis: Abnormality of gait  PT Problem List: Decreased strength;Decreased activity tolerance;Decreased mobility PT Treatment Interventions:  Gait training;Stair training;Functional mobility training;Therapeutic activities;Patient/family education   PT Goals Acute Rehab PT Goals PT Goal Formulation: With patient Time For Goal Achievement: 06/02/12 Potential to Achieve Goals: Good Pt will Ambulate: 51 - 150 feet;with modified independence;with rolling walker PT Goal: Ambulate - Progress: Goal set today Pt will Go Up / Down Stairs: 6-9 stairs;with supervision;with rail(s) PT Goal: Up/Down Stairs - Progress: Goal set today  Visit Information  Last PT Received On: 05/29/12 Assistance Needed: +1    Subjective Data  Subjective: I still have this tremor which is not the same, but I'm better. Patient Stated Goal: Get home   Prior Functioning  Home Living Lives With: Family Available Help at Discharge: Family;Available 24 hours/day Type of Home: Apartment Home Access: Stairs to enter Entrance Stairs-Number of Steps: 14 Entrance Stairs-Rails: Right;Left Home Layout: One level Bathroom Shower/Tub: Engineer, manufacturing systems: Standard Home Adaptive Equipment: Walker - rolling;Straight cane Additional Comments: Pt states she feels like she needs a new RW because hers is difficult to push at times. Prior Function Level of Independence: Needs assistance Able to Take Stairs?: Yes (with sons helping) Vocation: Other (comment) ((sedentary lifestyle. Watches tv most of the day.)) Comments: Pt's son and his girlfriend take care of meals/groceries and housekeeping. Pt reports she watches TV most of the day, Denies any other falls prior to the one she was admitted with Communication Communication: No difficulties    Cognition  Cognition Arousal/Alertness: Awake/alert Behavior During Therapy: WFL for tasks assessed/performed Overall Cognitive Status: Within Functional Limits for tasks assessed    Extremity/Trunk Assessment Right Lower Extremity Assessment RLE ROM/Strength/Tone: [facility] for tasks assessed Left Lower Extremity  Assessment LLE ROM/Strength/Tone: Within functional levels   Balance Balance  Balance Assessed: Yes Static Sitting Balance Static Sitting - Balance Support: Feet supported;No upper extremity supported Static Sitting - Level of Assistance: 7: Independent Dynamic Sitting Balance Dynamic Sitting - Balance Support: Feet supported;During functional activity Dynamic Sitting - Level of Assistance: 7: Independent Dynamic Sitting Balance - Compensations: donned socks sittig EOB Static Standing Balance Static Standing - Balance Support: Bilateral upper extremity supported Static Standing - Level of Assistance: 6: Modified independent (Device/Increase time)  End of Session PT - End of Session Activity Tolerance: Patient tolerated treatment well Patient left: in bed;with family/visitor present;with call bell/phone within reach Nurse Communication: Mobility status  GP     Oran Dillenburg, Eliseo Gum 05/29/2012, 3:14 PM 05/29/2012  Lodi Bing, PT [phone]  (pager)

## 2012-05-29 NOTE — Progress Notes (Addendum)
Subjective: Patient unchanged.  Working with therapy and using a walker as she does at home.  Echo and carotid doppler pending.  Objective: Current vital signs: BP 130/84  Pulse 88  Temp(Src) 98.2 F (36.8 C) (Oral)  Resp 20  Ht 5\' 1"  (1.549 m)  Wt 52.7 kg (116 lb 2.9 oz)  BMI 21.96 kg/m2  SpO2 100%  LMP 02/27/2012 Vital signs in last 24 hours: Temp:  [97.9 F (36.6 C)-98.8 F (37.1 C)] 98.2 F (36.8 C) (05/08 1409) Pulse Rate:  [73-92] 88 (05/08 1409) Resp:  [18-20] 20 (05/08 1409) BP: (99-137)/(75-90) 130/84 mmHg (05/08 1409) SpO2:  [100 %] 100 % (05/08 1409)  Intake/Output from previous day: 05/07 0701 - 05/08 0700 In: 240 [P.O.:240] Out: -  Intake/Output this shift:   Nutritional status: Carb Control  Neurologic Exam: Mental Status:  Alert, oriented, thought content appropriate. Speech fluent without evidence of aphasia. Able to follow 3 step commands without difficulty.  Cranial Nerves:  II: Discs flat bilaterally; Visual fields grossly normal, pupils equal, round, reactive to light and accommodation  III,IV, VI: ptosis not present, extra-ocular motions intact bilaterally  V,VII: smile symmetric, facial light touch sensation normal bilaterally  VIII: hearing normal bilaterally  IX,X: gag reflex present  XI: bilateral shoulder shrug  XII: midline tongue extension  Motor:  Right : Upper extremity 5/5 Left: Upper extremity 5/5  Lower extremity 5/5 Lower extremity 5/5  Tone and bulk:normal tone throughout; no atrophy noted  Sensory: Pinprick and light touch intact throughout, bilaterally  Deep Tendon Reflexes: 3+ and symmetric throughout    Lab Results: Basic Metabolic Panel:  Recent Labs Lab 05/27/12 2343 05/28/12 0426 05/28/12 1000 05/29/12 0636 05/29/12 0945  NA 141  --  139 138 138  K 3.0*  --  2.7* 3.9 4.0  CL 102  --  104 104 103  CO2 25  --  25 22 25   GLUCOSE 128*  --  87 88 98  BUN 7  --  4* 6 7  CREATININE 0.50  --  0.44* 0.62 0.59   CALCIUM 10.2  --  9.1 9.1 9.5  MG  --  2.0  --   --   --     Liver Function Tests:  Recent Labs Lab 05/27/12 2343  AST 21  ALT 10  ALKPHOS 85  BILITOT 1.9*  PROT 8.4*  ALBUMIN 4.6   No results found for this basename: LIPASE, AMYLASE,  in the last 168 hours No results found for this basename: AMMONIA,  in the last 168 hours  CBC:  Recent Labs Lab 05/27/12 2343 05/28/12 1000 05/29/12 0636  WBC 22.9* 19.1* 14.0*  NEUTROABS 20.2* 16.4* 11.1*  HGB 13.8 12.2 12.2  HCT 34.9* 33.0* 33.0*  MCV 83.5 85.9 87.3  PLT 216 212 205    Cardiac Enzymes: No results found for this basename: CKTOTAL, CKMB, CKMBINDEX, TROPONINI,  in the last 168 hours  Lipid Panel:  Recent Labs Lab 05/28/12 1000  CHOL 142  TRIG 49  HDL 66  CHOLHDL 2.2  VLDL 10  LDLCALC 66    CBG:  Recent Labs Lab 05/28/12 1645 05/28/12 2109 05/29/12 0654 05/29/12 1125 05/29/12 1647  GLUCAP 109* 110* 87 126* 102*    Microbiology: No results found for this or any previous visit.  Coagulation Studies: No results found for this basename: LABPROT, INR,  in the last 72 hours  Imaging: Ct Head Wo Contrast  05/28/2012  *RADIOLOGY REPORT*  Clinical Data: Fall, altered mental  status  CT HEAD WITHOUT CONTRAST  Technique:  Contiguous axial images were obtained from the base of the skull through the vertex without contrast.  Comparison: Brain MRI 03/18/2010  Findings: No acute intracranial hemorrhage.  No focal mass lesion. No CT evidence of acute infarction.   No midline shift or mass effect.  No hydrocephalus.  Basilar cisterns are patent.  There is extensive white matter hypodensity involving white matter tracts of the cerebellum and cerebellar hemispheres.  Findings are unchanged from comparison MRI.  Paranasal sinuses and mastoid air cells are clear.  Orbits are normal.  IMPRESSION:  1.  No acute intercranial findings. 2.  Extensive white matter disease is consistent with the patient's diagnosis of multiple  sclerosis.   Original Report Authenticated By: Genevive Bi, M.D.    Mr Brain Wo Contrast  05/28/2012  *RADIOLOGY REPORT*  Clinical Data:  Right leg weakness.  History multiple sclerosis, diabetes and hypertension.  MRI HEAD WITHOUT CONTRAST MRI CERVICAL SPINE WITHOUT CONTRAST  Technique:  Multiplanar, multiecho pulse sequences of the brain and surrounding structures, and cervical spine, to include the craniocervical junction and cervicothoracic junction, were obtained without intravenous contrast.  Comparison:  05/28/2012 CT.  03/18/2010 MR brain and MR cervical spine.  MRI HEAD  Findings:  No acute infarct.  Interval mild progression of the significant fairly confluent white matter type changes involving the supratentorial and of tentorial region consistent with the patient's history of multiple sclerosis. None of these areas demonstrates restricted motion as may be seen with areas of active demyelination.  Contrast not administered to evaluate for enhancing plaque.  Global atrophy without hydrocephalus.  No intracranial mass lesion detected on this unenhanced exam.  Major intracranial vascular structures are patent.  No intracranial hemorrhage.  Mucosal thickening paranasal sinuses greater on the right.  Orbital structures grossly intact.  Cervical medullary junction, pituitary region and pineal region unremarkable.  IMPRESSION: Interval mild progression of the significant fairly confluent white matter type changes involving the supratentorial and of tentorial region consistent with the patient's history of multiple sclerosis.  MRI CERVICAL SPINE  Findings: Motion degraded exam.  Minimal change in degree of low-level increased signal within the cord from the C1 through C5 level. Findings consistent with result of multiple sclerosis.  Several thyroid lesions largest lower isthmus measuring up to 2 cm appears slightly larger than on prior exam.  These can be followed with thyroid ultrasound.  C2-3:  No  significant spinal stenosis or foraminal narrowing.  C3-4:  Shallow protrusion with mild spinal stenosis and minimal cord contact.  C4-5:  Bulge/shallow protrusion.  Minimal cord contact.  C5-6:  Bulge/shallow protrusion slightly greater left paracentral position with minimal cord contact.  C6-7:  Minimal bulge.  C7- T1:  Negative.  IMPRESSION: Minimal change in degree of low-level increased signal within the cord from the C1 through C5 level. Findings consistent with result of multiple sclerosis.  Minimal changes in cervical spondylotic changes as noted above.  Several thyroid lesions largest lower isthmus measuring up to 2 cm appears slightly larger than on prior exam.  These can be followed with thyroid ultrasound.   Original Report Authenticated By: Lacy Duverney, M.D.    Mr Cervical Spine Wo Contrast  05/28/2012  *RADIOLOGY REPORT*  Clinical Data:  Right leg weakness.  History multiple sclerosis, diabetes and hypertension.  MRI HEAD WITHOUT CONTRAST MRI CERVICAL SPINE WITHOUT CONTRAST  Technique:  Multiplanar, multiecho pulse sequences of the brain and surrounding structures, and cervical spine, to include the craniocervical  junction and cervicothoracic junction, were obtained without intravenous contrast.  Comparison:  05/28/2012 CT.  03/18/2010 MR brain and MR cervical spine.  MRI HEAD  Findings:  No acute infarct.  Interval mild progression of the significant fairly confluent white matter type changes involving the supratentorial and of tentorial region consistent with the patient's history of multiple sclerosis. None of these areas demonstrates restricted motion as may be seen with areas of active demyelination.  Contrast not administered to evaluate for enhancing plaque.  Global atrophy without hydrocephalus.  No intracranial mass lesion detected on this unenhanced exam.  Major intracranial vascular structures are patent.  No intracranial hemorrhage.  Mucosal thickening paranasal sinuses greater on the  right.  Orbital structures grossly intact.  Cervical medullary junction, pituitary region and pineal region unremarkable.  IMPRESSION: Interval mild progression of the significant fairly confluent white matter type changes involving the supratentorial and of tentorial region consistent with the patient's history of multiple sclerosis.  MRI CERVICAL SPINE  Findings: Motion degraded exam.  Minimal change in degree of low-level increased signal within the cord from the C1 through C5 level. Findings consistent with result of multiple sclerosis.  Several thyroid lesions largest lower isthmus measuring up to 2 cm appears slightly larger than on prior exam.  These can be followed with thyroid ultrasound.  C2-3:  No significant spinal stenosis or foraminal narrowing.  C3-4:  Shallow protrusion with mild spinal stenosis and minimal cord contact.  C4-5:  Bulge/shallow protrusion.  Minimal cord contact.  C5-6:  Bulge/shallow protrusion slightly greater left paracentral position with minimal cord contact.  C6-7:  Minimal bulge.  C7- T1:  Negative.  IMPRESSION: Minimal change in degree of low-level increased signal within the cord from the C1 through C5 level. Findings consistent with result of multiple sclerosis.  Minimal changes in cervical spondylotic changes as noted above.  Several thyroid lesions largest lower isthmus measuring up to 2 cm appears slightly larger than on prior exam.  These can be followed with thyroid ultrasound.   Original Report Authenticated By: Lacy Duverney, M.D.    Dg Chest Port 1 View  05/28/2012  *RADIOLOGY REPORT*  Clinical Data: 42 year old female altered mental status vomiting dizzy shortness of breath.  PORTABLE CHEST - 1 VIEW  Comparison: 03/15/2010 and earlier.  Findings: Portable semi upright AP view 2358 hours.  Cardiac size and mediastinal contours are within normal limits.  Visualized tracheal air column is within normal limits.  Lung volumes within normal limits.  Allowing for portable  technique, the lungs are clear.  No pneumothorax or effusion.  IMPRESSION: Negative, no acute cardiopulmonary abnormality.   Original Report Authenticated By: Erskine Speed, M.D.     Medications:  I have reviewed the patient's current medications. Scheduled: . aspirin EC  325 mg Oral Daily  . enoxaparin (LOVENOX) injection  40 mg Subcutaneous Q24H  . feeding supplement  237 mL Oral BID BM  . hydrochlorothiazide  25 mg Oral Daily  . insulin aspart  0-5 Units Subcutaneous QHS  . insulin aspart  0-9 Units Subcutaneous TID WC  . metoprolol tartrate  25 mg Oral BID  . potassium chloride  40 mEq Oral BID    Assessment/Plan: Patient stable.  Remains on ASA.  Echo and carotid doppler pending.    Recommendations: 1.  Will f/u results of echo and CD 2.  Continue ASA 3.  Continue therapy.  Case discussed with Dr. David Stall   LOS: 2 days   Thana Farr, MD [facility] [phone]  05/29/2012  5:19 PM

## 2012-05-30 DIAGNOSIS — G35 Multiple sclerosis: Secondary | ICD-10-CM

## 2012-05-30 DIAGNOSIS — E876 Hypokalemia: Secondary | ICD-10-CM

## 2012-05-30 DIAGNOSIS — I10 Essential (primary) hypertension: Secondary | ICD-10-CM

## 2012-05-30 DIAGNOSIS — D72829 Elevated white blood cell count, unspecified: Secondary | ICD-10-CM

## 2012-05-30 DIAGNOSIS — R29898 Other symptoms and signs involving the musculoskeletal system: Secondary | ICD-10-CM

## 2012-05-30 LAB — BASIC METABOLIC PANEL
BUN: 8 mg/dL (ref 6–23)
Calcium: 9.9 mg/dL (ref 8.4–10.5)
GFR calc non Af Amer: >90 mL/min (ref >90)
Glucose, Bld: 93 mg/dL (ref 70–99)
Potassium: 4.6 mEq/L (ref 3.5–5.1)

## 2012-05-30 LAB — CBC
HCT: 35.4 % — ABNORMAL LOW (ref 36.0–46.0)
MCH: 32.7 pg (ref 26.0–34.0)
MCHC: 36.4 g/dL — ABNORMAL HIGH (ref 30.0–36.0)
MCV: 89.6 fL (ref 78.0–100.0)
RDW: 12.6 % (ref 11.5–15.5)
WBC: 10.7 10*3/uL — ABNORMAL HIGH (ref 4.0–10.5)

## 2012-05-30 LAB — GLUCOSE, CAPILLARY: Glucose-Capillary: 107 mg/dL — ABNORMAL HIGH (ref 70–99)

## 2012-05-30 MED ORDER — MECLIZINE HCL 25 MG PO TABS
25.0000 mg | ORAL_TABLET | Freq: Three times a day (TID) | ORAL | Status: DC | PRN
  Filled 2012-05-30: qty 1

## 2012-05-30 NOTE — Progress Notes (Signed)
[facility] PROGRESS NOTE  ENNA WARWICK AOZ:[id number] DOB: [birth year] DOA: 05/27/2012 PCP: No primary provider on file.  Assessment/Plan: #1 right lower extremity weakness/probable TIA The patient with clinical improvement. Likely a TIA. Head CT is negative. MRI of the head is negative. 2-D echo negative for any sources of emboli. Carotid Dopplers with no significant ICA stenosis. Continue aspirin for secondary stroke prevention. We'll need to followup with neurology as outpatient.  #2 multiple sclerosis Patient will need outpatient neurology followup.  #3 leukocytosis Likely secondary to recent steroid use. Infectious workup was negative. WBC is trending down. Patient afebrile. Follow.  #4 hypokalemia Repleted.  #5 type 2 diabetes Patient's hemoglobin A1c is 5.0. Less likely patient is a diabetic. Patient however was on Glucophage prior to admission. Follow.   6 hypertension Stable. Continue HCTZ, Lopressor,   #7. Vertigo Meclizine prn.  Code Status: Full Family Communication: Updated patient no family at bedside. Disposition Plan: Home when medically stable and one-day   Consultants:  Neurology: Dr. Amada Jupiter 05/28/2012   Procedures:  MRI head 05/28/2012  CT of the head 05/27/2012  Chest x-ray 05/28/2012  MRI of the C-spine 05/28/2012.  2-D echo 05/29/2012  Carotid Dopplers 05/29/2012  Antibiotics:  None  HPI/Subjective: Patient states that right lower extremity weakness improving.  Objective: Filed Vitals:   05/30/12 0031 05/30/12 0602 05/30/12 1011 05/30/12 1425  BP: 124/87 112/69 101/72 134/88  Pulse: 68 75 85 83  Temp: 98.6 F (37 C) 99.7 F (37.6 C) 98.5 F (36.9 C) 97.8 F (36.6 C)  TempSrc: Oral Oral Oral Oral  Resp: 18 18 16 15   Height:      Weight:      SpO2: 100% 100% 100% 100%    Intake/Output Summary (Last 24 hours) at 05/30/12 1450 Last data filed at 05/29/12 2200  Gross per 24 hour  Intake      0 ml  Output     400 ml  Net   -400 ml   Filed Weights   05/28/12 0739  Weight: 52.7 kg (116 lb 2.9 oz)    Exam:   General:  NAD  Cardiovascular: RRR  Respiratory: CTAB  Abdomen: Soft, nontender, nondistended, positive bowel sounds.  Musculoskeletal: 5/5 BUE strength,4/5 bilateral lower extremity strength.  Extremities: No clubbing cyanosis or edema.   Data Reviewed: Basic Metabolic Panel:  Recent Labs Lab 05/27/12 2343 05/28/12 0426 05/28/12 1000 05/29/12 0636 05/29/12 0945 05/30/12 0655  NA 141  --  139 138 138 136  K 3.0*  --  2.7* 3.9 4.0 4.6  CL 102  --  104 104 103 103  CO2 25  --  25 22 25 24   GLUCOSE 128*  --  87 88 98 93  BUN 7  --  4* 6 7 8   CREATININE 0.50  --  0.44* 0.62 0.59 0.56  CALCIUM 10.2  --  9.1 9.1 9.5 9.9  MG  --  2.0  --   --   --   --    Liver Function Tests:  Recent Labs Lab 05/27/12 2343  AST 21  ALT 10  ALKPHOS 85  BILITOT 1.9*  PROT 8.4*  ALBUMIN 4.6   No results found for this basename: LIPASE, AMYLASE,  in the last 168 hours No results found for this basename: AMMONIA,  in the last 168 hours CBC:  Recent Labs Lab 05/27/12 2343 05/28/12 1000 05/29/12 0636 05/30/12 0900  WBC 22.9* 19.1* 14.0* 10.7*  NEUTROABS 20.2* 16.4* 11.1*  --  HGB 13.8 12.2 12.2 12.9  HCT 34.9* 33.0* 33.0* 35.4*  MCV 83.5 85.9 87.3 89.6  PLT 216 212 205 221   Cardiac Enzymes: No results found for this basename: CKTOTAL, CKMB, CKMBINDEX, TROPONINI,  in the last 168 hours BNP (last 3 results) No results found for this basename: PROBNP,  in the last 8760 hours CBG:  Recent Labs Lab 05/29/12 1125 05/29/12 1647 05/29/12 2102 05/30/12 0759 05/30/12 1107  GLUCAP 126* 102* 86 96 126*    No results found for this or any previous visit (from the past 240 hour(s)).   Studies: No results found.  Scheduled Meds: . aspirin EC  325 mg Oral Daily  . enoxaparin (LOVENOX) injection  40 mg Subcutaneous Q24H  . feeding supplement  237 mL Oral BID BM  .  hydrochlorothiazide  25 mg Oral Daily  . insulin aspart  0-5 Units Subcutaneous QHS  . insulin aspart  0-9 Units Subcutaneous TID WC  . metoprolol tartrate  25 mg Oral BID   Continuous Infusions:   Principal Problem:   Right leg weakness Active Problems:   DIABETES MELLITUS, TYPE II   DYSLIPIDEMIA   TOBACCO USER   HYPERTENSION   Multiple sclerosis   Fall   Leukocytosis   Hypokalemia   Multiple sclerosis, relapsing-remitting    Time spent: > 30 mins    [facility]  [facility] Pager [phone]. If 7PM-7AM, please contact night-coverage at [email or web address], password [facility] 05/30/2012, 2:50 PM  LOS: 3 days

## 2012-05-30 NOTE — Progress Notes (Signed)
Physical Therapy Treatment Patient Details Name: [name] [name] MRN: [id number] DOB: 1970/06/15 Today's Date: 05/30/2012 Time: 1345-1401 PT Time Calculation (min): 16 min  PT Assessment / Plan / Recommendation Comments on Treatment Session  Patient progressing with ambulation and step but mildly unsteady. Would benefit from therapy at least once more prior to DC home    Follow Up Recommendations  No PT follow up     Does the patient have the potential to tolerate intense rehabilitation     Barriers to Discharge        Equipment Recommendations  Rolling walker with 5" wheels;Other (comment)    Recommendations for Other Services    Frequency Min 1X/week   Plan Discharge plan remains appropriate;Frequency remains appropriate    Precautions / Restrictions Precautions Precautions: Fall   Pertinent Vitals/Pain no apparent distress     Mobility  Bed Mobility Bed Mobility: Sit to Supine Supine to Sit: 6: Modified independent (Device/Increase time);HOB elevated;With rails Sitting - Scoot to Edge of Bed: 6: Modified independent (Device/Increase time) Sit to Supine: 6: Modified independent (Device/Increase time) Transfers Sit to Stand: 6: Modified independent (Device/Increase time);From bed;With upper extremity assist Stand to Sit: 6: Modified independent (Device/Increase time);To chair/3-in-1;With upper extremity assist;With armrests;To bed Ambulation/Gait Ambulation/Gait Assistance: 4: Min guard Ambulation Distance (Feet): 200 Feet Assistive device: Rolling walker Ambulation/Gait Assistance Details: Cues to stay close to RW and stand upright. Patient swaying left to right with ambulation this session Gait Pattern: Step-through pattern Stairs: Yes Stairs Assistance: 4: Min assist Stairs Assistance Details (indicate cue type and reason): A for balance and stability Stair Management Technique: Two rails;Forwards Number of Stairs: 6    Exercises     PT Diagnosis:    PT  Problem List:   PT Treatment Interventions:     PT Goals Acute Rehab PT Goals PT Goal: Ambulate - Progress: Progressing toward goal PT Goal: Up/Down Stairs - Progress: Progressing toward goal  Visit Information  Last PT Received On: 05/30/12 Assistance Needed: +1    Subjective Data      Cognition  Cognition Arousal/Alertness: Awake/alert Behavior During Therapy: WFL for tasks assessed/performed Overall Cognitive Status: Within Functional Limits for tasks assessed    Balance     End of Session PT - End of Session Activity Tolerance: Patient tolerated treatment well Patient left: in bed;with call bell/phone within reach;with bed alarm set   GP     Fredrich Birks 05/30/2012, 2:13 PM 05/30/2012 Fredrich Birks PTA [phone] pager [phone] office

## 2012-05-30 NOTE — Progress Notes (Addendum)
Subjective: She feels somewhat back to her baseline.  Right lower leg is better  Objective: Current vital signs: BP 112/69  Pulse 75  Temp(Src) 99.7 F (37.6 C) (Oral)  Resp 18  Ht 5\' 1"  (1.549 m)  Wt 116 lb 2.9 oz (52.7 kg)  BMI 21.96 kg/m2  SpO2 100%  LMP 02/27/2012 Vital signs in last 24 hours: Temp:  [97.9 F (36.6 C)-99.7 F (37.6 C)] 99.7 F (37.6 C) (05/09 0602) Pulse Rate:  [68-88] 75 (05/09 0602) Resp:  [18-20] 18 (05/09 0602) BP: (112-139)/(69-99) 112/69 mmHg (05/09 0602) SpO2:  [100 %] 100 % (05/09 0602)  Intake/Output from previous day: 05/08 0701 - 05/09 0700 In: 480 [P.O.:480] Out: 400 [Urine:400] Intake/Output this shift:   Nutritional status: Carb Control  Neurologic Exam: Mental Status:  Alert, oriented, thought content appropriate. Speech fluent without evidence of aphasia. Able to follow 3 step commands without difficulty.  Cranial Nerves:  II: Discs flat bilaterally; Visual fields grossly normal, pupils equal, round, reactive to light and accommodation  III,IV, VI: ptosis not present, extra-ocular motions intact bilaterally  V,VII: smile symmetric, facial light touch sensation normal bilaterally  VIII: hearing normal bilaterally  IX,X: gag reflex present  XI: bilateral shoulder shrug  XII: midline tongue extension  Motor:  Right : Upper extremity 5/5          Left: Upper extremity 5/5  Lower extremity 5/5        Lower extremity 5/5  Tone and bulk:normal tone throughout; no atrophy noted  Sensory: Pinprick and light touch intact throughout, bilaterally  Deep Tendon Reflexes: 3+ and symmetric throughout    Lab Results: Results for orders placed during the hospital encounter of 05/27/12 (from the past 48 hour(s))  HEMOGLOBIN A1C     Status: None   Collection Time    05/28/12 10:00 AM      Result Value Range   Hemoglobin A1C 5.0  <5.7 %   Comment: (NOTE)                                                                               According  to the ADA Clinical Practice Recommendations for 2011, when     HbA1c is used as a screening test:      >=6.5%   Diagnostic of Diabetes Mellitus               (if abnormal result is confirmed)     5.7-6.4%   Increased risk of developing Diabetes Mellitus     References:Diagnosis and Classification of Diabetes Mellitus,Diabetes     Care,2011,34(Suppl 1):S62-S69 and Standards of Medical Care in             Diabetes - 2011,Diabetes Care,2011,34 (Suppl 1):S11-S61.   Mean Plasma Glucose 97  <117 mg/dL  LIPID PANEL     Status: None   Collection Time    05/28/12 10:00 AM      Result Value Range   Cholesterol 142  0 - 200 mg/dL   Triglycerides 49  <657 mg/dL   HDL 66  >84 mg/dL   Total CHOL/HDL Ratio 2.2     VLDL 10  0 - 40 mg/dL   LDL Cholesterol  66  0 - 99 mg/dL   Comment:            Total Cholesterol/HDL:CHD Risk     Coronary Heart Disease Risk Table                         Men   Women      1/2 Average Risk   3.4   3.3      Average Risk       5.0   4.4      2 X Average Risk   9.6   7.1      3 X Average Risk  23.4   11.0                Use the calculated Patient Ratio     above and the CHD Risk Table     to determine the patient's CHD Risk.                ATP III CLASSIFICATION (LDL):      <100     mg/dL   Optimal      478-295  mg/dL   Near or Above                        Optimal      130-159  mg/dL   Borderline      621-308  mg/dL   High      >657     mg/dL   Very High  BASIC METABOLIC PANEL     Status: Abnormal   Collection Time    05/28/12 10:00 AM      Result Value Range   Sodium 139  135 - 145 mEq/L   Potassium 2.7 (*) 3.5 - 5.1 mEq/L   Comment: REPEATED TO VERIFY     CRITICAL RESULT CALLED TO, READ BACK BY AND VERIFIED WITH:     JAMES,J RN @ 1119 ON 05/28/12 BY LEONARD,A   Chloride 104  96 - 112 mEq/L   CO2 25  19 - 32 mEq/L   Glucose, Bld 87  70 - 99 mg/dL   BUN 4 (*) 6 - 23 mg/dL   Creatinine, Ser 8.46 (*) 0.50 - 1.10 mg/dL   Calcium 9.1  8.4 - 96.2 mg/dL   GFR  calc non Af Amer >90  >90 mL/min   GFR calc Af Amer >90  >90 mL/min   Comment:            The eGFR has been calculated     using the CKD EPI equation.     This calculation has not been     validated in all clinical     situations.     eGFR's persistently     <90 mL/min signify     possible Chronic Kidney Disease.  CBC WITH DIFFERENTIAL     Status: Abnormal   Collection Time    05/28/12 10:00 AM      Result Value Range   WBC 19.1 (*) 4.0 - 10.5 K/uL   RBC 3.84 (*) 3.87 - 5.11 MIL/uL   Hemoglobin 12.2  12.0 - 15.0 g/dL   HCT 95.2 (*) 84.1 - 32.4 %   MCV 85.9  78.0 - 100.0 fL   MCH 31.8  26.0 - 34.0 pg   MCHC 37.0 (*) 30.0 - 36.0 g/dL   RDW 40.1  02.7 - 25.3 %   Platelets 212  150 -  400 K/uL   Neutrophils Relative 86 (*) 43 - 77 %   Neutro Abs 16.4 (*) 1.7 - 7.7 K/uL   Lymphocytes Relative 8 (*) 12 - 46 %   Lymphs Abs 1.4  0.7 - 4.0 K/uL   Monocytes Relative 7  3 - 12 %   Monocytes Absolute 1.3 (*) 0.1 - 1.0 K/uL   Eosinophils Relative 0  0 - 5 %   Eosinophils Absolute 0.0  0.0 - 0.7 K/uL   Basophils Relative 0  0 - 1 %   Basophils Absolute 0.0  0.0 - 0.1 K/uL  GLUCOSE, CAPILLARY     Status: Abnormal   Collection Time    05/28/12 11:36 AM      Result Value Range   Glucose-Capillary 111 (*) 70 - 99 mg/dL   Comment 1 Documented in Chart     Comment 2 Notify RN    GLUCOSE, CAPILLARY     Status: Abnormal   Collection Time    05/28/12  4:45 PM      Result Value Range   Glucose-Capillary 109 (*) 70 - 99 mg/dL   Comment 1 Notify RN     Comment 2 Documented in Chart    GLUCOSE, CAPILLARY     Status: Abnormal   Collection Time    05/28/12  9:09 PM      Result Value Range   Glucose-Capillary 110 (*) 70 - 99 mg/dL   Comment 1 Documented in Chart     Comment 2 Notify RN    BASIC METABOLIC PANEL     Status: None   Collection Time    05/29/12  6:36 AM      Result Value Range   Sodium 138  135 - 145 mEq/L   Potassium 3.9  3.5 - 5.1 mEq/L   Chloride 104  96 - 112 mEq/L    CO2 22  19 - 32 mEq/L   Glucose, Bld 88  70 - 99 mg/dL   BUN 6  6 - 23 mg/dL   Creatinine, Ser 9.14  0.50 - 1.10 mg/dL   Calcium 9.1  8.4 - 78.2 mg/dL   GFR calc non Af Amer >90  >90 mL/min   GFR calc Af Amer >90  >90 mL/min   Comment:            The eGFR has been calculated     using the CKD EPI equation.     This calculation has not been     validated in all clinical     situations.     eGFR's persistently     <90 mL/min signify     possible Chronic Kidney Disease.  CBC WITH DIFFERENTIAL     Status: Abnormal   Collection Time    05/29/12  6:36 AM      Result Value Range   WBC 14.0 (*) 4.0 - 10.5 K/uL   RBC 3.78 (*) 3.87 - 5.11 MIL/uL   Hemoglobin 12.2  12.0 - 15.0 g/dL   HCT 95.6 (*) 21.3 - 08.6 %   MCV 87.3  78.0 - 100.0 fL   MCH 32.3  26.0 - 34.0 pg   MCHC 37.0 (*) 30.0 - 36.0 g/dL   RDW 57.8  46.9 - 62.9 %   Platelets 205  150 - 400 K/uL   Neutrophils Relative 79 (*) 43 - 77 %   Neutro Abs 11.1 (*) 1.7 - 7.7 K/uL   Lymphocytes Relative 14  12 - 46 %  Lymphs Abs 1.9  0.7 - 4.0 K/uL   Monocytes Relative 6  3 - 12 %   Monocytes Absolute 0.8  0.1 - 1.0 K/uL   Eosinophils Relative 1  0 - 5 %   Eosinophils Absolute 0.2  0.0 - 0.7 K/uL   Basophils Relative 0  0 - 1 %   Basophils Absolute 0.0  0.0 - 0.1 K/uL  GLUCOSE, CAPILLARY     Status: None   Collection Time    05/29/12  6:54 AM      Result Value Range   Glucose-Capillary 87  70 - 99 mg/dL   Comment 1 Documented in Chart     Comment 2 Notify RN    BASIC METABOLIC PANEL     Status: None   Collection Time    05/29/12  9:45 AM      Result Value Range   Sodium 138  135 - 145 mEq/L   Potassium 4.0  3.5 - 5.1 mEq/L   Chloride 103  96 - 112 mEq/L   CO2 25  19 - 32 mEq/L   Glucose, Bld 98  70 - 99 mg/dL   BUN 7  6 - 23 mg/dL   Creatinine, Ser 9.14  0.50 - 1.10 mg/dL   Calcium 9.5  8.4 - 78.2 mg/dL   GFR calc non Af Amer >90  >90 mL/min   GFR calc Af Amer >90  >90 mL/min   Comment:            The eGFR has been  calculated     using the CKD EPI equation.     This calculation has not been     validated in all clinical     situations.     eGFR's persistently     <90 mL/min signify     possible Chronic Kidney Disease.  GLUCOSE, CAPILLARY     Status: Abnormal   Collection Time    05/29/12 11:25 AM      Result Value Range   Glucose-Capillary 126 (*) 70 - 99 mg/dL   Comment 1 Documented in Chart     Comment 2 Notify RN    GLUCOSE, CAPILLARY     Status: Abnormal   Collection Time    05/29/12  4:47 PM      Result Value Range   Glucose-Capillary 102 (*) 70 - 99 mg/dL  GLUCOSE, CAPILLARY     Status: None   Collection Time    05/29/12  9:02 PM      Result Value Range   Glucose-Capillary 86  70 - 99 mg/dL  BASIC METABOLIC PANEL     Status: None   Collection Time    05/30/12  6:55 AM      Result Value Range   Sodium 136  135 - 145 mEq/L   Potassium 4.6  3.5 - 5.1 mEq/L   Chloride 103  96 - 112 mEq/L   CO2 24  19 - 32 mEq/L   Glucose, Bld 93  70 - 99 mg/dL   BUN 8  6 - 23 mg/dL   Creatinine, Ser 9.56  0.50 - 1.10 mg/dL   Calcium 9.9  8.4 - 21.3 mg/dL   GFR calc non Af Amer >90  >90 mL/min   GFR calc Af Amer >90  >90 mL/min   Comment:            The eGFR has been calculated     using the CKD EPI equation.  This calculation has not been     validated in all clinical     situations.     eGFR's persistently     <90 mL/min signify     possible Chronic Kidney Disease.  GLUCOSE, CAPILLARY     Status: None   Collection Time    05/30/12  7:59 AM      Result Value Range   Glucose-Capillary 96  70 - 99 mg/dL    No results found for this or any previous visit (from the past 240 hour(s)).  Lipid Panel  Recent Labs  05/28/12 1000  CHOL 142  TRIG 49  HDL 66  CHOLHDL 2.2  VLDL 10  LDLCALC 66   Medications: reviewed   LOS: 3 days   Carrolyn Meiers, PGY-3 05/30/2012  9:04 AM  History and physical obtained and discussed with resident.  Clinical course and management discussed.   Necessary edits performed.  I agree with the above.  Assessment and plan of care developed and discussed below.    Assessment/Plan: 42 year old female with MS presented with RLE weakness likely TIA now seems to be at baseline.  With her being at baseline steroids for an MS exacerbation are not indicated.  Stroke work up initiated and echo shows a trivial pericardial effusion but no evidence of intracardiac masses or thrombi.  Carotid doppler pending.   Recommendations:  1. Will f/u results of carotid doppler 2. Continue ASA  3. Will need to follow up with neurology as outpatient and consider starting disease modifying therapy  Case discussed with Dr. Lenda Kelp, MD [facility] [phone]  05/30/2012  11:32 AM

## 2012-05-30 NOTE — Care Management Note (Unsigned)
    Page 1 of 1   05/30/2012     3:38:49 PM   CARE MANAGEMENT NOTE 05/30/2012  Patient:  [name] [name], [name] [name]   Account Number:  [id number]  Date Initiated:  05/28/2012  Documentation initiated by:  [facility]  Subjective/Objective Assessment:   admitted with rt leg weakness, patient has MS     Action/Plan:   PT/OT evals-no f/u recommended   Anticipated DC Date:  05/31/2012   Anticipated DC Plan:  HOME/SELF CARE      DC Planning Services  CM consult      Choice offered to / List presented to:     DME arranged  WALKER - ROLLING  3-N-1      DME agency  [facility]        Status of service:  In process, will continue to follow Medicare Important Message given?   (If response is "NO", the following Medicare IM given date fields will be blank) Date Medicare IM given:   Date Additional Medicare IM given:    Discharge Disposition:    Per UR Regulation:  Reviewed for med. necessity/level of care/duration of stay  If discussed at Long Length of Stay Meetings, dates discussed:    Comments:  05/30/12 Spoke with patient about equipment, she is agreeable to rolling walker and  3N1. Contacted [facility] and requested rolling walker and 3N1 be delivered to patient's room prior to discharge. Informed patient and MD that HHPT not covered by Medicaid. PT not currently recommending HHPT. Jacquelynn Cree RN, BSN, CCM

## 2012-05-31 DIAGNOSIS — H8110 Benign paroxysmal vertigo, unspecified ear: Secondary | ICD-10-CM

## 2012-05-31 DIAGNOSIS — G459 Transient cerebral ischemic attack, unspecified: Secondary | ICD-10-CM | POA: Diagnosis present

## 2012-05-31 LAB — GLUCOSE, CAPILLARY: Glucose-Capillary: 93 mg/dL (ref 70–99)

## 2012-05-31 MED ORDER — MECLIZINE HCL 25 MG PO TABS: 25.0000 mg | ORAL_TABLET | Freq: Three times a day (TID) | ORAL | Status: DC | PRN

## 2012-05-31 MED ORDER — ASPIRIN 325 MG PO TBEC: 325.0000 mg | DELAYED_RELEASE_TABLET | Freq: Every day | ORAL | Status: DC

## 2012-05-31 MED ORDER — ENSURE COMPLETE PO LIQD: 237.0000 mL | Freq: Two times a day (BID) | ORAL | Status: DC

## 2012-05-31 NOTE — Progress Notes (Signed)
   CARE MANAGEMENT NOTE 05/31/2012  Patient:  DREMA, EDDINGTON   Account Number:  [id number]  Date Initiated:  05/28/2012  Documentation initiated by:  [facility]  Subjective/Objective Assessment:   admitted with rt leg weakness, patient has MS     Action/Plan:   PT/OT evals-no f/u recommended   Anticipated DC Date:  05/31/2012   Anticipated DC Plan:  HOME/SELF CARE      DC Planning Services  CM consult      Choice offered to / List presented to:     DME arranged  WALKER - ROLLING  3-N-1      DME agency  [facility]        Status of service:  Completed, signed off Medicare Important Message given?   (If response is "NO", the following Medicare IM given date fields will be blank) Date Medicare IM given:   Date Additional Medicare IM given:    Discharge Disposition:    Per UR Regulation:  Reviewed for med. necessity/level of care/duration of stay  If discussed at Long Length of Stay Meetings, dates discussed:    Comments:  05/31/12  12:35pm Patient requesting PCP information. Provided resources regarding accessing a primary care provider. Information placed on discharge instructions. Magnus Ivan, RN, BSN, CCM  05/30/12 Spoke with patient about equipment, she is agreeable to rolling walker and  3N1. Contacted [facility] and requested rolling walker and 3N1 be delivered to patient's room prior to discharge. Informed patient and MD that HHPT not covered by Medicaid. PT not currently recommending HHPT. Jacquelynn Cree RN, BSN, CCM

## 2012-05-31 NOTE — Progress Notes (Signed)
Discussed discharge instructions with pt & family, understanding was verbalized. IV removed, skin dry and intact. Pt transported in wheelchair.

## 2012-05-31 NOTE — Discharge Summary (Signed)
Physician Discharge Summary  [name] [name] ION:[id number] DOB: [birth year] DOA: 05/27/2012  PCP: No primary provider on file.  Admit date: 05/27/2012 Discharge date: 05/31/2012  Time spent: 60 minutes  Recommendations for Outpatient Follow-up:  1. Patient will need to followup with PCP one week post discharge. 2. Patient in need to followup with neurology as outpatient for management of her multiple sclerosis in followup on the TIA.  Discharge Diagnoses:  Principal Problem:   TIA (transient ischemic attack) Active Problems:   DIABETES MELLITUS, TYPE II   DYSLIPIDEMIA   TOBACCO USER   HYPERTENSION   Multiple sclerosis   Right leg weakness   Fall   Leukocytosis   Hypokalemia   Multiple sclerosis, relapsing-remitting   Discharge Condition: Stable and improved  Diet recommendation: Carb modified  Filed Weights   05/28/12 0739  Weight: 52.7 kg (116 lb 2.9 oz)    History of present illness:  [name] [name] is a 42 y.o. African American female with history of hypertension, diabetes, and multiple sclerosis who presents with the above complaints. Patient reports that she recently completed a course of steroids last month prescribed by her primary care physician for unclear etiology.she was last seen by her son around 3 p.m. On 05/27/2012. Patient reports that around 330 p.m. She was in the bathroom and somebody knocked on her door as she made her way to the door, she her right leg give out and she fell to the floor. She denies losing consciousness or hitting her head. She normally uses a walker at home to get by. Her son found her, patient is not sure at what time. She was brought to the emergency department for further evaluation. Patient was evaluated by neurology and recommended admission for further care and management. Patient denies any recent fevers, chills, nausea, vomiting, chest pain, shortness of breath, abdominal pain, diarrhea, headaches or vision changes.       Hospital Course:  #1 right lower extremity weakness/probable TIA  Patient was admitted with right lower extremity weakness with concerns for possible CVA versus TIA. Head CT was done which was negative. MRI of the head was also done which was negative. 2-D echo which was done was negative for any source of emboli. Carotid Dopplers which were done had no significant stenosis. Patient was maintained on aspirin for secondary stroke prevention neurology was consulted. Patient was seen in consultation by Dr. Amada Jupiter on 05/28/2012 followed throughout the hospitalization. Patient was seen by physical therapy. Patient improved clinically and it was felt her symptoms were likely secondary to a probable TIA. Due to improvement in her symptoms at AMS flare was unlikely. Patient be discharged home in stable and improved condition. Patient will follow with neurology as outpatient.   #2 multiple sclerosis  Patient will need outpatient neurology followup.  #3 leukocytosis  On admission patient was noted to have a leukocytosis. Infectious workup including urinalysis and chest x-ray which were done was negative. Prior to admission patient had recently been placed on steroids and also this was likely secondary to back versus reactive. Patient remained afebrile. Her white count trended down. Patient will followup with PCP as outpatient.  #4 hypokalemia  Repleted.    The rest of patient's chronic medical issues remain stable throughout the hospitalization the patient was discharged in stable and improved condition.   Procedures: MRI head 05/28/2012  CT of the head 05/27/2012  Chest x-ray 05/28/2012  MRI of the C-spine 05/28/2012.  2-D echo 05/29/2012  Carotid Dopplers 05/29/2012  Consultations: Neurology: Dr. Amada Jupiter 05/28/2012    Discharge Exam: Filed Vitals:   05/30/12 2200 05/31/12 0200 05/31/12 0600 05/31/12 1000  BP: 147/89 151/97 135/90 117/81  Pulse: 82 77 83 73  Temp: 97.8 F  (36.6 C) 98.1 F (36.7 C) 98.3 F (36.8 C) 98.2 F (36.8 C)  TempSrc: Oral Oral Oral Oral  Resp: 16 18 18 17   Height:      Weight:      SpO2: 99% 100% 100% 100%    General: NAD Cardiovascular: RRR Respiratory: CTAB  Discharge Instructions      Discharge Orders   Future Orders Complete By Expires     Diet Carb Modified  As directed     Discharge instructions  As directed     Comments:      Follow up with PCP in 1 week. Follow up with neurology in 1-2 weeks.    Increase activity slowly  As directed         Medication List    TAKE these medications       aspirin 325 MG EC tablet  Take 1 tablet (325 mg total) by mouth daily.     feeding supplement Liqd  Take 237 mLs by mouth 2 (two) times daily between meals.     hydrochlorothiazide 25 MG tablet  Commonly known as:  HYDRODIURIL  Take 25 mg by mouth daily.     meclizine 25 MG tablet  Commonly known as:  ANTIVERT  Take 1 tablet (25 mg total) by mouth 3 (three) times daily as needed for dizziness or nausea.     metFORMIN 500 MG tablet  Commonly known as:  GLUCOPHAGE  Take 500 mg by mouth daily with breakfast.     metoprolol tartrate 25 MG tablet  Commonly known as:  LOPRESSOR  Take 25 mg by mouth 2 (two) times daily.       No Known Allergies Follow-up Information   Please follow up. (f/u with PCP in 1 week)       Follow up with Lesly Dukes, MD. Schedule an appointment as soon as possible for a visit in 1 week. (f/u 1-2 weeks)    Contact information:   [address] [place] [address] [phone]        The results of significant diagnostics from this hospitalization (including imaging, microbiology, ancillary and laboratory) are listed below for reference.    Significant Diagnostic Studies: Ct Head Wo Contrast  05/28/2012  *RADIOLOGY REPORT*  Clinical Data: Fall, altered mental status  CT HEAD WITHOUT CONTRAST  Technique:  Contiguous axial images were obtained from the base  of the skull through the vertex without contrast.  Comparison: Brain MRI 03/18/2010  Findings: No acute intracranial hemorrhage.  No focal mass lesion. No CT evidence of acute infarction.   No midline shift or mass effect.  No hydrocephalus.  Basilar cisterns are patent.  There is extensive white matter hypodensity involving white matter tracts of the cerebellum and cerebellar hemispheres.  Findings are unchanged from comparison MRI.  Paranasal sinuses and mastoid air cells are clear.  Orbits are normal.  IMPRESSION:  1.  No acute intercranial findings. 2.  Extensive white matter disease is consistent with the patient's diagnosis of multiple sclerosis.   Original Report Authenticated By: Genevive Bi, M.D.    Mr Brain Wo Contrast  05/28/2012  *RADIOLOGY REPORT*  Clinical Data:  Right leg weakness.  History multiple sclerosis, diabetes and hypertension.  MRI HEAD WITHOUT CONTRAST MRI CERVICAL SPINE  WITHOUT CONTRAST  Technique:  Multiplanar, multiecho pulse sequences of the brain and surrounding structures, and cervical spine, to include the craniocervical junction and cervicothoracic junction, were obtained without intravenous contrast.  Comparison:  05/28/2012 CT.  03/18/2010 MR brain and MR cervical spine.  MRI HEAD  Findings:  No acute infarct.  Interval mild progression of the significant fairly confluent white matter type changes involving the supratentorial and of tentorial region consistent with the patient's history of multiple sclerosis. None of these areas demonstrates restricted motion as may be seen with areas of active demyelination.  Contrast not administered to evaluate for enhancing plaque.  Global atrophy without hydrocephalus.  No intracranial mass lesion detected on this unenhanced exam.  Major intracranial vascular structures are patent.  No intracranial hemorrhage.  Mucosal thickening paranasal sinuses greater on the right.  Orbital structures grossly intact.  Cervical medullary junction,  pituitary region and pineal region unremarkable.  IMPRESSION: Interval mild progression of the significant fairly confluent white matter type changes involving the supratentorial and of tentorial region consistent with the patient's history of multiple sclerosis.  MRI CERVICAL SPINE  Findings: Motion degraded exam.  Minimal change in degree of low-level increased signal within the cord from the C1 through C5 level. Findings consistent with result of multiple sclerosis.  Several thyroid lesions largest lower isthmus measuring up to 2 cm appears slightly larger than on prior exam.  These can be followed with thyroid ultrasound.  C2-3:  No significant spinal stenosis or foraminal narrowing.  C3-4:  Shallow protrusion with mild spinal stenosis and minimal cord contact.  C4-5:  Bulge/shallow protrusion.  Minimal cord contact.  C5-6:  Bulge/shallow protrusion slightly greater left paracentral position with minimal cord contact.  C6-7:  Minimal bulge.  C7- T1:  Negative.  IMPRESSION: Minimal change in degree of low-level increased signal within the cord from the C1 through C5 level. Findings consistent with result of multiple sclerosis.  Minimal changes in cervical spondylotic changes as noted above.  Several thyroid lesions largest lower isthmus measuring up to 2 cm appears slightly larger than on prior exam.  These can be followed with thyroid ultrasound.   Original Report Authenticated By: Lacy Duverney, M.D.    Mr Cervical Spine Wo Contrast  05/28/2012  *RADIOLOGY REPORT*  Clinical Data:  Right leg weakness.  History multiple sclerosis, diabetes and hypertension.  MRI HEAD WITHOUT CONTRAST MRI CERVICAL SPINE WITHOUT CONTRAST  Technique:  Multiplanar, multiecho pulse sequences of the brain and surrounding structures, and cervical spine, to include the craniocervical junction and cervicothoracic junction, were obtained without intravenous contrast.  Comparison:  05/28/2012 CT.  03/18/2010 MR brain and MR cervical spine.   MRI HEAD  Findings:  No acute infarct.  Interval mild progression of the significant fairly confluent white matter type changes involving the supratentorial and of tentorial region consistent with the patient's history of multiple sclerosis. None of these areas demonstrates restricted motion as may be seen with areas of active demyelination.  Contrast not administered to evaluate for enhancing plaque.  Global atrophy without hydrocephalus.  No intracranial mass lesion detected on this unenhanced exam.  Major intracranial vascular structures are patent.  No intracranial hemorrhage.  Mucosal thickening paranasal sinuses greater on the right.  Orbital structures grossly intact.  Cervical medullary junction, pituitary region and pineal region unremarkable.  IMPRESSION: Interval mild progression of the significant fairly confluent white matter type changes involving the supratentorial and of tentorial region consistent with the patient's history of multiple sclerosis.  MRI CERVICAL SPINE  Findings: Motion degraded exam.  Minimal change in degree of low-level increased signal within the cord from the C1 through C5 level. Findings consistent with result of multiple sclerosis.  Several thyroid lesions largest lower isthmus measuring up to 2 cm appears slightly larger than on prior exam.  These can be followed with thyroid ultrasound.  C2-3:  No significant spinal stenosis or foraminal narrowing.  C3-4:  Shallow protrusion with mild spinal stenosis and minimal cord contact.  C4-5:  Bulge/shallow protrusion.  Minimal cord contact.  C5-6:  Bulge/shallow protrusion slightly greater left paracentral position with minimal cord contact.  C6-7:  Minimal bulge.  C7- T1:  Negative.  IMPRESSION: Minimal change in degree of low-level increased signal within the cord from the C1 through C5 level. Findings consistent with result of multiple sclerosis.  Minimal changes in cervical spondylotic changes as noted above.  Several thyroid  lesions largest lower isthmus measuring up to 2 cm appears slightly larger than on prior exam.  These can be followed with thyroid ultrasound.   Original Report Authenticated By: Lacy Duverney, M.D.    Dg Chest Port 1 View  05/28/2012  *RADIOLOGY REPORT*  Clinical Data: 42 year old female altered mental status vomiting dizzy shortness of breath.  PORTABLE CHEST - 1 VIEW  Comparison: 03/15/2010 and earlier.  Findings: Portable semi upright AP view 2358 hours.  Cardiac size and mediastinal contours are within normal limits.  Visualized tracheal air column is within normal limits.  Lung volumes within normal limits.  Allowing for portable technique, the lungs are clear.  No pneumothorax or effusion.  IMPRESSION: Negative, no acute cardiopulmonary abnormality.   Original Report Authenticated By: Erskine Speed, M.D.     Microbiology: No results found for this or any previous visit (from the past 240 hour(s)).   Labs: Basic Metabolic Panel:  Recent Labs Lab 05/27/12 2343 05/28/12 0426 05/28/12 1000 05/29/12 0636 05/29/12 0945 05/30/12 0655  NA 141  --  139 138 138 136  K 3.0*  --  2.7* 3.9 4.0 4.6  CL 102  --  104 104 103 103  CO2 25  --  25 22 25 24   GLUCOSE 128*  --  87 88 98 93  BUN 7  --  4* 6 7 8   CREATININE 0.50  --  0.44* 0.62 0.59 0.56  CALCIUM 10.2  --  9.1 9.1 9.5 9.9  MG  --  2.0  --   --   --   --    Liver Function Tests:  Recent Labs Lab 05/27/12 2343  AST 21  ALT 10  ALKPHOS 85  BILITOT 1.9*  PROT 8.4*  ALBUMIN 4.6   No results found for this basename: LIPASE, AMYLASE,  in the last 168 hours No results found for this basename: AMMONIA,  in the last 168 hours CBC:  Recent Labs Lab 05/27/12 2343 05/28/12 1000 05/29/12 0636 05/30/12 0900  WBC 22.9* 19.1* 14.0* 10.7*  NEUTROABS 20.2* 16.4* 11.1*  --   HGB 13.8 12.2 12.2 12.9  HCT 34.9* 33.0* 33.0* 35.4*  MCV 83.5 85.9 87.3 89.6  PLT 216 212 205 221   Cardiac Enzymes: No results found for this basename:  CKTOTAL, CKMB, CKMBINDEX, TROPONINI,  in the last 168 hours BNP: BNP (last 3 results) No results found for this basename: PROBNP,  in the last 8760 hours CBG:  Recent Labs Lab 05/30/12 1107 05/30/12 1636 05/30/12 2104 05/31/12 0631 05/31/12 1145  GLUCAP 126* 96 107* 93 123*  Signed:  Peter Keyworth  [facility] 05/31/2012, 5:52 PM

## 2012-06-03 ENCOUNTER — Encounter (HOSPITAL_COMMUNITY): Payer: Self-pay | Admitting: Emergency Medicine

## 2012-06-03 ENCOUNTER — Emergency Department (HOSPITAL_COMMUNITY)
Admission: EM | Admit: 2012-06-03 | Discharge: 2012-06-03 | Disposition: A | Discharge status: Home / Self Care | Payer: Medicaid Other | Attending: Emergency Medicine | Admitting: Emergency Medicine

## 2012-06-03 DIAGNOSIS — G35 Multiple sclerosis: Secondary | ICD-10-CM | POA: Insufficient documentation

## 2012-06-03 DIAGNOSIS — M6281 Muscle weakness (generalized): Secondary | ICD-10-CM | POA: Insufficient documentation

## 2012-06-03 DIAGNOSIS — K5900 Constipation, unspecified: Secondary | ICD-10-CM | POA: Diagnosis present

## 2012-06-03 DIAGNOSIS — Z8673 Personal history of transient ischemic attack (TIA), and cerebral infarction without residual deficits: Secondary | ICD-10-CM | POA: Insufficient documentation

## 2012-06-03 DIAGNOSIS — E1159 Type 2 diabetes mellitus with other circulatory complications: Secondary | ICD-10-CM | POA: Insufficient documentation

## 2012-06-03 DIAGNOSIS — F17200 Nicotine dependence, unspecified, uncomplicated: Secondary | ICD-10-CM | POA: Insufficient documentation

## 2012-06-03 DIAGNOSIS — E785 Hyperlipidemia, unspecified: Secondary | ICD-10-CM | POA: Insufficient documentation

## 2012-06-03 DIAGNOSIS — Z79899 Other long term (current) drug therapy: Secondary | ICD-10-CM | POA: Insufficient documentation

## 2012-06-03 DIAGNOSIS — L0231 Cutaneous abscess of buttock: Secondary | ICD-10-CM | POA: Diagnosis present

## 2012-06-03 DIAGNOSIS — I10 Essential (primary) hypertension: Secondary | ICD-10-CM | POA: Insufficient documentation

## 2012-06-03 HISTORY — DX: Multiple sclerosis: G35

## 2012-06-03 HISTORY — DX: Nicotine dependence, unspecified, uncomplicated: F17.200

## 2012-06-03 HISTORY — DX: Transient cerebral ischemic attack, unspecified: G45.9

## 2012-06-03 HISTORY — DX: Benign paroxysmal vertigo, unspecified ear: H81.10

## 2012-06-03 HISTORY — DX: Hyperlipidemia, unspecified: E78.5

## 2012-06-03 LAB — COMPREHENSIVE METABOLIC PANEL
ALT: 11 U/L (ref 0–35)
AST: 18 U/L (ref 0–37)
Albumin: 3.8 g/dL (ref 3.5–5.2)
CO2: 26 mEq/L (ref 19–32)
Calcium: 10.2 mg/dL (ref 8.4–10.5)
Chloride: 98 mEq/L (ref 96–112)
GFR calc non Af Amer: >90 mL/min (ref >90)
Sodium: 138 mEq/L (ref 135–145)
Total Bilirubin: 1.1 mg/dL (ref 0.3–1.2)

## 2012-06-03 LAB — CBC
MCV: 89.9 fL (ref 78.0–100.0)
Platelets: 335 10*3/uL (ref 150–400)
RDW: 12.2 % (ref 11.5–15.5)
WBC: 16.6 10*3/uL — ABNORMAL HIGH (ref 4.0–10.5)

## 2012-06-03 MED ORDER — POLYETHYLENE GLYCOL 3350 17 GM/SCOOP PO POWD: 17.0000 g | Freq: Two times a day (BID) | ORAL | Status: DC | PRN

## 2012-06-03 MED ORDER — SULFAMETHOXAZOLE-TRIMETHOPRIM 800-160 MG PO TABS: 1.0000 | ORAL_TABLET | Freq: Two times a day (BID) | ORAL | Status: DC

## 2012-06-03 MED ORDER — SODIUM CHLORIDE 0.9 % IV BOLUS (SEPSIS)
500.0000 mL | Freq: Once | INTRAVENOUS | Status: AC
  Administered 2012-06-03: 500 mL via INTRAVENOUS

## 2012-06-03 NOTE — ED Provider Notes (Signed)
I saw and evaluated the patient, reviewed the resident's note and I agree with the findings and plan.   [name] Bucco, MD 06/03/12 2021

## 2012-06-03 NOTE — ED Provider Notes (Signed)
PT with open draining abcess to gluteal cleft.  Does not appear to include perirectal area.  Is well open and draining, does not appear to need I&D, no surrounding cellulitis.  No signs of systemic illness.  No signs of worsening MS flare/neuro deficits.  Will start abx, wound care, return in 2 days for wound check.  Rolan Bucco, MD 06/03/12 [phone]

## 2012-06-03 NOTE — ED Notes (Signed)
Pt discharged.Vital signs stable and GCS 15 

## 2012-06-03 NOTE — ED Notes (Signed)
Discharged 2 days ago from hospital for TIA and one day ago right lower extremity resolved. States developed an abscess on buttocks and is constipated. Last BM 2 days ago small amount while in hospital.

## 2012-06-03 NOTE — ED Provider Notes (Signed)
History     CSN: [id number] Arrival date & time 06/03/12  1109  First MD Initiated Contact with Patient 06/03/12 1136     Chief Complaint  Patient presents with  . Abscess  . Constipation  . Extremity Weakness   HPI Comments: Patient is a 42 year old female who was recently admitted for TIA workup that was unremarkable.  She has a history of multiple sclerosis but due to the acuity of her symptoms was not felt to be an MS flare. Her son brought her back to the emergency department today with reports of buttock abscess, some persistent intermittent weakness and constipation.   # Buttock Abscess: Her son reports the patient was complaining of buttock pain in his girlfriend look at it yesterday and saw a large pimple that was approximately the size of a quarter and swollen with a "whitehead".  This morning the patient does report some improvement in pain with some drainage at the site. Patient denies any fevers, chills, diaphoresis  # Intermittent weakness: Son reports his moms continued to have issues with ambulating has not had any focal unilateral weakness, no dysarthria, no change in vision, no headaches, no vomiting, no falls.  She has been experiencing these symptoms for quite some time and has reports of these have seem to worsen over the past couple of weeks.  # Constipation: Patient reports not having a bowel movement 2 days.  Last bowel movement was normal, pain free, without blood or black tarry stools.takes no medications for constipation.  Denies abdominal pain    The history is provided by the patient, a relative and a caregiver.    Past Medical History  Diagnosis Date  . Multiple sclerosis   . HYPERTENSION 09/30/2006  . DIABETES MELLITUS, TYPE II 09/30/2006  . TIA (transient ischemic attack)   . TOBACCO USER 09/30/2006    Qualifier: Diagnosis of  By: Delrae Alfred MD, Lanora Manis    . Multiple sclerosis, relapsing-remitting 05/29/2012  . DYSLIPIDEMIA 09/30/2006    Qualifier: Diagnosis of   By: Delrae Alfred MD, Lanora Manis    . DIABETES MELLITUS, TYPE II 09/30/2006    Qualifier: Diagnosis of  By: Delrae Alfred MD, Lanora Manis    . BENIGN POSITIONAL VERTIGO 01/27/2010    Qualifier: Diagnosis of  By: Delrae Alfred MD, Lanora Manis      History reviewed. No pertinent past surgical history.  Family History  Problem Relation Age of Onset  . Hypertension Mother   . Kidney failure Mother   . Hypertension Father   . Gout Father   . Hypertension Sister   . Diabetes Sister   . Hypertension Brother     History  Substance Use Topics  . Smoking status: Current Every Day Smoker -- 0.25 packs/day  . Smokeless tobacco: Not on file  . Alcohol Use: No    OB History   Grav Para Term Preterm Abortions TAB SAB Ect Mult Living                  Review of Systems  Constitutional: Negative for fever, chills, diaphoresis, activity change, fatigue and unexpected weight change.  HENT: Negative for congestion, sneezing, drooling and sinus pressure.   Eyes: Negative.   Respiratory: Negative for cough, chest tightness, shortness of breath and wheezing.   Cardiovascular: Negative for chest pain, palpitations and leg swelling.  Gastrointestinal: Negative for nausea, vomiting, abdominal pain, diarrhea, constipation, blood in stool, abdominal distention, anal bleeding and rectal pain.  Endocrine: Negative.   Genitourinary: Negative for dysuria, flank pain, enuresis, difficulty urinating  and pelvic pain.  Musculoskeletal: Negative.        No falls  Skin: Negative.   Allergic/Immunologic: Negative for environmental allergies, food allergies and immunocompromised state.  Neurological: Positive for weakness. Negative for dizziness, syncope, speech difficulty, light-headedness, numbness and headaches.  Hematological: Negative.   Psychiatric/Behavioral: Negative.     Allergies  Review of patient's allergies indicates no known allergies.  Home Medications   Current Outpatient Rx  Name  Route  Sig  Dispense   Refill  . hydrochlorothiazide (HYDRODIURIL) 25 MG tablet   Oral   Take 25 mg by mouth daily.         . meclizine (ANTIVERT) 25 MG tablet   Oral   Take 1 tablet (25 mg total) by mouth 3 (three) times daily as needed for dizziness or nausea.   20 tablet   0   . metFORMIN (GLUCOPHAGE) 500 MG tablet   Oral   Take 500 mg by mouth daily with breakfast.         . metoprolol tartrate (LOPRESSOR) 25 MG tablet   Oral   Take 25 mg by mouth 2 (two) times daily.         . polyethylene glycol powder (MIRALAX) powder   Oral   Take 17 g by mouth 2 (two) times daily as needed. Take up to twice a day.  Decrease to once per day if more than 2 BM per day   255 g   0   . sulfamethoxazole-trimethoprim (BACTRIM DS) 800-160 MG per tablet   Oral   Take 1 tablet by mouth 2 (two) times daily.   20 tablet   0     BP 125/82  Pulse 79  Temp(Src) 98.7 F (37.1 C) (Oral)  Resp 18  SpO2 100%  LMP 02/27/2012  Physical Exam  Nursing note and vitals reviewed. Constitutional: She appears well-developed and well-nourished. No distress.  HENT:  Head: Normocephalic and atraumatic.  Eyes: Conjunctivae are normal. Right eye exhibits no discharge. Left eye exhibits no discharge. No scleral icterus.  Neck: No JVD present. No tracheal deviation present.  Cardiovascular: Normal rate, regular rhythm, normal heart sounds and intact distal pulses.  Exam reveals no gallop and no friction rub.   No murmur heard. Pulmonary/Chest: Effort normal and breath sounds normal. No respiratory distress. She has no wheezes. She has no rales.  Abdominal: Soft. Bowel sounds are normal. She exhibits no distension and no mass. There is no tenderness. There is no rebound and no guarding.  Genitourinary:  Normal rectal tone,Patient impacted, no communication to abscess site.no abnormal tenderness or fluctuance.  Musculoskeletal: Normal range of motion. She exhibits no edema.  Neurological: She is alert. She has normal  strength. No cranial nerve deficit or sensory deficit. She exhibits normal muscle tone. Coordination normal. GCS eye subscore is 4. GCS verbal subscore is 5. GCS motor subscore is 6.  Skin: Skin is warm and dry. No rash noted. She is not diaphoretic. No erythema. No pallor.     Psychiatric: She has a normal mood and affect. Her behavior is normal. Judgment and thought content normal.    ED Course  Procedures (including critical care time)  Labs Reviewed  CBC - Abnormal; Notable for the following:    WBC 16.6 (*)    All other components within normal limits  COMPREHENSIVE METABOLIC PANEL - Abnormal; Notable for the following:    Potassium 3.1 (*)    Glucose, Bld 100 (*)    Total Protein 8.7 (*)  All other components within normal limits  CBC WITH DIFFERENTIAL   No results found.   1. Multiple sclerosis, relapsing-remitting   2. Constipation   3. Cutaneous abscess of buttock     MDM  Pt recently discharged from hospital due to TIA in setting of MS.  Son brings pt back to ED for further evaluation of buttock abscess, no BM X 2 days and continued weakness.  Abscess is open actively draining and without communication to rectum, does not probe to bone.  Likely explains leukocytosis from last hospitalization; slightly worsened but no systemic s/sx.  Will start bactrim given location and hx of MRSA, Miralax 1 capful titrate to 1-2 soft BMs per day; follow up with Neurology and PCP as requested from time of discharge.  Return to ED or PCP in 2 days for wound check  Patient manually disimpacted.  We'll provide Rx for MiraLax and encourage high-fiber diet   Andrena Mews, DO 06/03/12 1944

## 2012-07-21 ENCOUNTER — Ambulatory Visit: Payer: Medicaid Other | Attending: Family Medicine | Admitting: Internal Medicine

## 2012-07-21 VITALS — BP 165/115 | HR 56 | Temp 98.7°F | Ht 61.0 in | Wt 113.8 lb

## 2012-07-21 DIAGNOSIS — I10 Essential (primary) hypertension: Secondary | ICD-10-CM

## 2012-07-21 DIAGNOSIS — H538 Other visual disturbances: Secondary | ICD-10-CM

## 2012-07-21 DIAGNOSIS — G35 Multiple sclerosis: Secondary | ICD-10-CM

## 2012-07-21 DIAGNOSIS — F329 Major depressive disorder, single episode, unspecified: Secondary | ICD-10-CM

## 2012-07-21 DIAGNOSIS — E119 Type 2 diabetes mellitus without complications: Secondary | ICD-10-CM

## 2012-07-21 LAB — BASIC METABOLIC PANEL
BUN: 11 mg/dL (ref 6–23)
Chloride: 104 mEq/L (ref 96–112)
Creat: 0.58 mg/dL (ref 0.50–1.10)
Glucose, Bld: 95 mg/dL (ref 70–99)

## 2012-07-21 MED ORDER — METOPROLOL TARTRATE 25 MG PO TABS: 25.0000 mg | ORAL_TABLET | Freq: Two times a day (BID) | ORAL | Status: DC

## 2012-07-21 MED ORDER — ASPIRIN 81 MG PO TBEC: 81.0000 mg | DELAYED_RELEASE_TABLET | Freq: Every day | ORAL | Status: DC

## 2012-07-21 MED ORDER — METFORMIN HCL 500 MG PO TABS: 500.0000 mg | ORAL_TABLET | Freq: Every day | ORAL | Status: DC

## 2012-07-21 MED ORDER — HYDROCHLOROTHIAZIDE 25 MG PO TABS: 25.0000 mg | ORAL_TABLET | Freq: Every day | ORAL | Status: DC

## 2012-07-21 MED ORDER — MECLIZINE HCL 25 MG PO TABS: 25.0000 mg | ORAL_TABLET | Freq: Three times a day (TID) | ORAL | Status: DC | PRN

## 2012-07-21 NOTE — Patient Instructions (Addendum)
Take a baby aspirin daily to prevent further strokes.  Followup with ophthalmology for your eye issues and with neurology for multiple sclerosis.  Return to clinic in one month to further discuss management of depression.

## 2012-07-21 NOTE — Progress Notes (Signed)
Patient ID: [name] [name], female   DOB: December 04, 1970, 42 y.o.   MRN: [id number]  CC:  HPI: This is a 41 year old female who presents to the clinic to establish care and obtain refills on her medication. She has a history of diabetes but does not have a glucometer to check her sugars with. She has a complaint of blurred vision and excessive tearing of the eyes and would like an ophthalmology appointment. Her son also notes that the patient has significant amount of depression and often sits around the house and cries. She has no activities or friends to keep her off to provide. Patient does not complaining of any suicidal ideation and only admits to depression and occasional anxiety. Has a history of multiple sclerosis but is not on any medication for this and does not regularly follow with a neurologist. No Known Allergies Past Medical History  Diagnosis Date  . Multiple sclerosis   . HYPERTENSION 09/30/2006  . DIABETES MELLITUS, TYPE II 09/30/2006  . TIA (transient ischemic attack)   . TOBACCO USER 09/30/2006    Qualifier: Diagnosis of  By: Delrae Alfred MD, Lanora Manis    . Multiple sclerosis, relapsing-remitting 05/29/2012  . DYSLIPIDEMIA 09/30/2006    Qualifier: Diagnosis of  By: Delrae Alfred MD, Lanora Manis    . DIABETES MELLITUS, TYPE II 09/30/2006    Qualifier: Diagnosis of  By: Delrae Alfred MD, Lanora Manis    . BENIGN POSITIONAL VERTIGO 01/27/2010    Qualifier: Diagnosis of  By: Delrae Alfred MD, Lanora Manis     Current Outpatient Prescriptions on File Prior to Visit  Medication Sig Dispense Refill  . polyethylene glycol powder (MIRALAX) powder Take 17 g by mouth 2 (two) times daily as needed. Take up to twice a day.  Decrease to once per day if more than 2 BM per day  255 g  0   No current facility-administered medications on file prior to visit.   Family History  Problem Relation Age of Onset  . Hypertension Mother   . Kidney failure Mother   . Hypertension Father   . Gout Father   . Hypertension Sister    . Diabetes Sister   . Hypertension Brother    History   Social History  . Marital Status: Single    Spouse Name: N/A    Number of Children: N/A  . Years of Education: N/A   Occupational History  . Not on file.   Social History Main Topics  . Smoking status: Former Smoker -- 0.25 packs/day  . Smokeless tobacco: Not on file  . Alcohol Use: No  . Drug Use: Not on file  . Sexually Active: Not on file   Other Topics Concern  . Not on file   Social History Narrative  . No narrative on file    Review of Systems ______ Constitutional: Negative for fever, chills, diaphoresis, activity change, appetite change and fatigue. ____ HENT: Negative for ear pain, nosebleeds, congestion, facial swelling, rhinorrhea, neck pain, neck stiffness and ear discharge.  ____ Eyes: Negative for pain, discharge, redness, itching and visual disturbance. ____ Respiratory: Negative for cough, choking, chest tightness, shortness of breath, wheezing and stridor.  ____ Cardiovascular: Negative for chest pain, palpitations and leg swelling. ____ Gastrointestinal: Negative for abdominal distention. Positive for occasional nausea.  Genitourinary: Negative for dysuria, urgency, frequency, hematuria, flank pain, decreased urine volume, difficulty urinating and dyspareunia. ____ Musculoskeletal: Negative for back pain, joint swelling, arthralgias and gait problem. ________ Neurological: Negative for dizziness, tremors, seizures, syncope, facial asymmetry, speech difficulty, weakness,  light-headedness, numbness and headaches. ____ Hematological: Negative for adenopathy. Does not bruise/bleed easily. ____ Psychiatric/Behavioral: Negative for hallucinations, behavioral problems, confusion, positive for depression and occasional anxiety.  Objective:   Filed Vitals:   07/21/12 1258  BP: 165/115  Pulse: 56  Temp: 98.7 F (37.1 C)    Physical Exam ______ Constitutional: Appears well-developed and  well-nourished. No distress. ____ HENT: Normocephalic. External right and left ear normal. Oropharynx is clear and moist. ____ Eyes: Conjunctivae and EOM are normal. PERRLA, no scleral icterus. ____ Neck: Normal ROM. Neck supple. No JVD. No tracheal deviation. No thyromegaly. ____ CVS: RRR, S1/S2 +, no murmurs, no gallops, no carotid bruit.  Pulmonary: Effort and breath sounds normal, no stridor, rhonchi, wheezes, rales.  Abdominal: Soft. BS +,  no distension, tenderness, rebound or guarding. ________ Musculoskeletal: Normal range of motion. No edema and no tenderness. ____ Lymphadenopathy: No lymphadenopathy noted, cervical, inguinal. Neuro: Alert. Normal reflexes, muscle tone coordination. No cranial nerve deficit. Skin: Skin is warm and dry. No rash noted. Not diaphoretic. No erythema. No pallor. Hypertrichosis noted on face and chin  Psychiatric: Normal mood and affect. Behavior, judgment, thought content normal. __  Lab Results  Component Value Date   WBC 16.6* 06/03/2012   HGB 13.9 06/03/2012   HCT 39.2 06/03/2012   MCV 89.9 06/03/2012   PLT 335 06/03/2012   Lab Results  Component Value Date   CREATININE 0.61 06/03/2012   BUN 7 06/03/2012   NA 138 06/03/2012   K 3.1* 06/03/2012   CL 98 06/03/2012   CO2 26 06/03/2012    Lab Results  Component Value Date   HGBA1C 5.0 05/28/2012   Lipid Panel     Component Value Date/Time   CHOL 142 05/28/2012 1000   TRIG 49 05/28/2012 1000   HDL 66 05/28/2012 1000   CHOLHDL 2.2 05/28/2012 1000   VLDL 10 05/28/2012 1000   LDLCALC 66 05/28/2012 1000       Assessment and plan:   Patient Active Problem List   Diagnosis Date Noted  . Constipation 06/03/2012  . Cutaneous abscess of buttock 06/03/2012  . TIA (transient ischemic attack) 05/31/2012  . Multiple sclerosis, relapsing-remitting 05/29/2012  . Right leg weakness 05/28/2012  . Fall 05/28/2012  . Leukocytosis 05/28/2012  . Hypokalemia 05/28/2012  . BENIGN POSITIONAL VERTIGO 01/27/2010  . CARPAL  TUNNEL SYNDROME, LEFT 04/14/2009  . SHOULDER STRAIN, LEFT 03/03/2009  . DIABETES MELLITUS, TYPE II 09/30/2006  . DYSLIPIDEMIA 09/30/2006  . TOBACCO USER 09/30/2006  . HYPERTENSION 09/30/2006  . Multiple sclerosis 12/05/2005       #1. Hypertension: Continue current medications refills have been given  #2. Diabetes mellitus: Continue metformin. We'll give a prescription for a glucometer as well.  #3. vertigo: She states meclizine usually helps with this and therefore refills have been given  #4. Depression: The patient has been offered SSRI treatment. At this point she is going to attempt to manage it on her on and will followup in one month and let us know of she would like to be placed on medications.  #5. Tobacco abuse: She has successfully stopped smoking  #6. TIA: Advised to continue baby aspirin daily  #7. Health maintenance: We'll need to be scheduled for a mammogram and a Pap smear.  #8. History of hypokalemia: Will be to recheck potassium levels prior to ordering more potassium-at this point she has run out of her potassium.

## 2012-07-23 ENCOUNTER — Emergency Department (HOSPITAL_COMMUNITY)
Admission: EM | Admit: 2012-07-23 | Discharge: 2012-07-24 | Disposition: A | Discharge status: Home / Self Care | Payer: Medicaid Other | Attending: Emergency Medicine | Admitting: Emergency Medicine

## 2012-07-23 ENCOUNTER — Encounter (HOSPITAL_COMMUNITY): Payer: Self-pay | Admitting: Emergency Medicine

## 2012-07-23 DIAGNOSIS — H8110 Benign paroxysmal vertigo, unspecified ear: Secondary | ICD-10-CM | POA: Insufficient documentation

## 2012-07-23 DIAGNOSIS — G35 Multiple sclerosis: Secondary | ICD-10-CM | POA: Insufficient documentation

## 2012-07-23 DIAGNOSIS — R279 Unspecified lack of coordination: Secondary | ICD-10-CM | POA: Insufficient documentation

## 2012-07-23 DIAGNOSIS — R5381 Other malaise: Secondary | ICD-10-CM | POA: Insufficient documentation

## 2012-07-23 DIAGNOSIS — Z8673 Personal history of transient ischemic attack (TIA), and cerebral infarction without residual deficits: Secondary | ICD-10-CM | POA: Insufficient documentation

## 2012-07-23 DIAGNOSIS — Z794 Long term (current) use of insulin: Secondary | ICD-10-CM | POA: Insufficient documentation

## 2012-07-23 DIAGNOSIS — E876 Hypokalemia: Secondary | ICD-10-CM | POA: Insufficient documentation

## 2012-07-23 DIAGNOSIS — E785 Hyperlipidemia, unspecified: Secondary | ICD-10-CM | POA: Insufficient documentation

## 2012-07-23 DIAGNOSIS — R5383 Other fatigue: Secondary | ICD-10-CM | POA: Insufficient documentation

## 2012-07-23 DIAGNOSIS — Z87891 Personal history of nicotine dependence: Secondary | ICD-10-CM | POA: Insufficient documentation

## 2012-07-23 DIAGNOSIS — Z79899 Other long term (current) drug therapy: Secondary | ICD-10-CM | POA: Insufficient documentation

## 2012-07-23 DIAGNOSIS — I10 Essential (primary) hypertension: Secondary | ICD-10-CM | POA: Insufficient documentation

## 2012-07-23 DIAGNOSIS — R4789 Other speech disturbances: Secondary | ICD-10-CM | POA: Insufficient documentation

## 2012-07-23 DIAGNOSIS — E119 Type 2 diabetes mellitus without complications: Secondary | ICD-10-CM | POA: Insufficient documentation

## 2012-07-23 LAB — COMPREHENSIVE METABOLIC PANEL
ALT: 13 U/L (ref 0–35)
AST: 16 U/L (ref 0–37)
Albumin: 4.2 g/dL (ref 3.5–5.2)
Alkaline Phosphatase: 69 U/L (ref 39–117)
Calcium: 9.7 mg/dL (ref 8.4–10.5)
Glucose, Bld: 99 mg/dL (ref 70–99)
Potassium: 2.5 mEq/L — CL (ref 3.5–5.1)
Sodium: 139 mEq/L (ref 135–145)
Total Protein: 7.5 g/dL (ref 6.0–8.3)

## 2012-07-23 LAB — CBC WITH DIFFERENTIAL/PLATELET
Basophils Absolute: 0 10*3/uL (ref 0.0–0.1)
Basophils Relative: 0 % (ref 0–1)
Eosinophils Absolute: 0.1 10*3/uL (ref 0.0–0.7)
Eosinophils Relative: 1 % (ref 0–5)
Lymphs Abs: 1.6 10*3/uL (ref 0.7–4.0)
MCH: 32.6 pg (ref 26.0–34.0)
Neutrophils Relative %: 78 % — ABNORMAL HIGH (ref 43–77)
Platelets: 233 10*3/uL (ref 150–400)
RBC: 4.38 MIL/uL (ref 3.87–5.11)
RDW: 13 % (ref 11.5–15.5)

## 2012-07-23 MED ORDER — POTASSIUM CHLORIDE 10 MEQ/100ML IV SOLN
10.0000 meq | INTRAVENOUS | Status: AC
  Administered 2012-07-23 – 2012-07-24 (×3): 10 meq via INTRAVENOUS
  Filled 2012-07-23 (×3): qty 100

## 2012-07-23 MED ORDER — POTASSIUM CHLORIDE CRYS ER 20 MEQ PO TBCR
40.0000 meq | EXTENDED_RELEASE_TABLET | Freq: Once | ORAL | Status: AC
  Administered 2012-07-23: 40 meq via ORAL
  Filled 2012-07-23: qty 2

## 2012-07-23 NOTE — ED Notes (Signed)
PT. REPORTS INTERMITTENT UNSTEADY GAIT FOR SEVERAL WEEKS / WEAK LEGS . ALERT AND ORIENTED , SPEECH CLEAR / NO FACIAL ASYMMETRY / EQUAL STRONG RIPS / NO ARM DRIFT.

## 2012-07-23 NOTE — ED Notes (Signed)
Received critical value Potassium 2.5 from Lab. Dr. Silverio Lay and Molli Knock RN, Ed made aware.

## 2012-07-24 LAB — POCT I-STAT, CHEM 8
BUN: 3 mg/dL — ABNORMAL LOW (ref 6–23)
Calcium, Ion: 1.16 mmol/L (ref 1.12–1.23)
Chloride: 102 mEq/L (ref 96–112)
Creatinine, Ser: 0.7 mg/dL (ref 0.50–1.10)
Glucose, Bld: 101 mg/dL — ABNORMAL HIGH (ref 70–99)
TCO2: 27 mmol/L (ref 0–100)

## 2012-07-24 MED ORDER — POTASSIUM CHLORIDE ER 10 MEQ PO TBCR: 20.0000 meq | EXTENDED_RELEASE_TABLET | Freq: Two times a day (BID) | ORAL | Status: DC

## 2012-07-24 NOTE — ED Provider Notes (Signed)
History    CSN: [id number] Arrival date & time 07/23/12  2049  First MD Initiated Contact with Patient 07/23/12 2259     Chief Complaint  Patient presents with  . Gait Problem   (Consider location/radiation/quality/duration/timing/severity/associated sxs/prior Treatment) HPI 42 yo female presents to the ER with reported generalized weakness, difficulties with gait, and slurred speech earlier.  Pt admitted in May for TIA workup.  Sxs are waxing/waning for several months.  Pt has h/o MS, not currently being seen by neurology.  Family reports loose stools today, taking miralax for constipation.  Pt seen by PCM on Monday, did not get potassium refilled at that time, had nl potassium at that time.  Has had problems with low potassium in the past.  No n/v.  Eating normally.  No focal weakness,  No difficulties with eating, drinking.  No confusion.  Past Medical History  Diagnosis Date  . Multiple sclerosis   . HYPERTENSION 09/30/2006  . DIABETES MELLITUS, TYPE II 09/30/2006  . TIA (transient ischemic attack)   . TOBACCO USER 09/30/2006    Qualifier: Diagnosis of  By: Delrae Alfred MD, Lanora Manis    . Multiple sclerosis, relapsing-remitting 05/29/2012  . DYSLIPIDEMIA 09/30/2006    Qualifier: Diagnosis of  By: Delrae Alfred MD, Lanora Manis    . DIABETES MELLITUS, TYPE II 09/30/2006    Qualifier: Diagnosis of  By: Delrae Alfred MD, Lanora Manis    . BENIGN POSITIONAL VERTIGO 01/27/2010    Qualifier: Diagnosis of  By: Delrae Alfred MD, Lanora Manis     No past surgical history on file. Family History  Problem Relation Age of Onset  . Hypertension Mother   . Kidney failure Mother   . Hypertension Father   . Gout Father   . Hypertension Sister   . Diabetes Sister   . Hypertension Brother    History  Substance Use Topics  . Smoking status: Former Smoker -- 0.25 packs/day  . Smokeless tobacco: Not on file  . Alcohol Use: No   OB History   Grav Para Term Preterm Abortions TAB SAB Ect Mult Living                 Review  of Systems  All other systems reviewed and are negative.  other than listed in HPI  Allergies  Review of patient's allergies indicates no known allergies.  Home Medications   Current Outpatient Rx  Name  Route  Sig  Dispense  Refill  . hydrochlorothiazide (HYDRODIURIL) 25 MG tablet   Oral   Take 25 mg by mouth daily.         . meclizine (ANTIVERT) 25 MG tablet   Oral   Take 25 mg by mouth 3 (three) times daily as needed for dizziness.         . metFORMIN (GLUCOPHAGE) 500 MG tablet   Oral   Take 500 mg by mouth 2 (two) times daily with a meal.         . metoprolol tartrate (LOPRESSOR) 25 MG tablet   Oral   Take 25 mg by mouth 2 (two) times daily.          BP 179/93  Pulse 93  Temp(Src) 98.4 F (36.9 C) (Oral)  Resp 14  SpO2 100%  LMP 07/20/2012 Physical Exam  Nursing note and vitals reviewed. Constitutional: She is oriented to person, place, and time. She appears well-developed and well-nourished.  HENT:  Head: Normocephalic and atraumatic.  Right Ear: External ear normal.  Left Ear: External ear normal.  Nose:  Nose normal.  Mouth/Throat: Oropharynx is clear and moist.  Eyes: Conjunctivae and EOM are normal. Pupils are equal, round, and reactive to light.  Neck: Normal range of motion. Neck supple. No JVD present. No tracheal deviation present. No thyromegaly present.  Cardiovascular: Normal rate, regular rhythm, normal heart sounds and intact distal pulses.  Exam reveals no gallop and no friction rub.   No murmur heard. Pulmonary/Chest: Effort normal and breath sounds normal. No stridor. No respiratory distress. She has no wheezes. She has no rales. She exhibits no tenderness.  Abdominal: Soft. Bowel sounds are normal. She exhibits no distension and no mass. There is no tenderness. There is no rebound and no guarding.  Musculoskeletal: Normal range of motion. She exhibits no edema and no tenderness.  Lymphadenopathy:    She has no cervical adenopathy.   Neurological: She is alert and oriented to person, place, and time. She has normal reflexes. No cranial nerve deficit. She exhibits normal muscle tone. Coordination (mild ataxia with walking) abnormal.  Skin: Skin is warm and dry. No rash noted. No erythema. No pallor.  Psychiatric: She has a normal mood and affect. Her behavior is normal. Judgment and thought content normal.    ED Course  Procedures (including critical care time) Labs Reviewed  CBC WITH DIFFERENTIAL - Abnormal; Notable for the following:    Neutrophils Relative % 78 (*)    Neutro Abs 7.8 (*)    All other components within normal limits  COMPREHENSIVE METABOLIC PANEL - Abnormal; Notable for the following:    Potassium 2.5 (*)    All other components within normal limits  POCT I-STAT, CHEM 8 - Abnormal; Notable for the following:    Potassium 3.3 (*)    BUN 3 (*)    Glucose, Bld 101 (*)    All other components within normal limits   No results found. 1. Hypokalemia   2. Multiple sclerosis     MDM  42 yo female with hypokalemia, intermittent weakness, gait problems.  Discussed with patient need for f/u with neuro for treatment of MS.  Neuro exam does not show focal weakness, some gait instability.  Will treat hypokalemia, possibly due to recent loose stools.  Olivia Mackie, MD 07/24/12 [phone]

## 2012-07-24 NOTE — Discharge Instructions (Signed)
Take potassium as prescribed.  Follow up with your doctor for recheck of your potassium in 1 week.  It is very important for you to follow up with a neurologist who can help with your MS symptoms!   Hypokalemia Hypokalemia means a low potassium level in the blood.Potassium is an electrolyte that helps regulate the amount of fluid in the body. It also stimulates muscle contraction and maintains a stable acid-base balance.Most of the body's potassium is inside of cells, and only a very small amount is in the blood. Because the amount in the blood is so small, minor changes can have big effects. PREPARATION FOR TEST Testing for potassium requires taking a blood sample taken by needle from a vein in the arm. The skin is cleaned thoroughly before the sample is drawn. There is no other special preparation needed. NORMAL VALUES Potassium levels below 3.5 mEq/L are abnormally low. Levels above 5.1 mEq/L are abnormally high. Ranges for normal findings may vary among different laboratories and hospitals. You should always check with your doctor after having lab work or other tests done to discuss the meaning of your test results and whether your values are considered within normal limits. MEANING OF TEST  Your caregiver will go over the test results with you and discuss the importance and meaning of your results, as well as treatment options and the need for additional tests, if necessary. A potassium level is frequently part of a routine medical exam. It is usually included as part of a whole "panel" of tests for several blood salts (such as Sodium and Chloride). It may be done as part of follow-up when a low potassium level was found in the past or other blood salts are suspected of being out of balance. A low potassium level might be suspected if you have one or more of the following:  Symptoms of weakness.  Abnormal heart rhythms.  High blood pressure and are taking medication to control this,  especially water pills (diuretics).  Kidney disease that can affect your potassium level .  Diabetes requiring the use of insulin. The potassium may fall after taking insulin, especially if the diabetes had been out of control for a while.  A condition requiring the use of cortisone-type medication or certain types of antibiotics.  Vomiting and/or diarrhea for more than a day or two.  A stomach or intestinal condition that may not permit appropriate absorption of potassium.  Fainting episodes.  Mental confusion. OBTAINING TEST RESULTS It is your responsibility to obtain your test results. Ask the lab or department performing the test when and how you will get your results.  Please contact your caregiver directly if you have not received the results within one week. At that time, ask if there is anything different or new you should be doing in relation to the results. TREATMENT Hypokalemia can be treated with potassium supplements taken by mouth and/or adjustments in your current medications. A diet high in potassium is also helpful. Foods with high potassium content are:  Peas, lentils, lima beans, nuts, and dried fruit.  Whole grain and bran cereals and breads.  Fresh fruit, vegetables (bananas, cantaloupe, grapefruit, oranges, tomatoes, honeydew melons, potatoes).  Orange and tomato juices.  Meats. If potassium supplement has been prescribed for you today or your medications have been adjusted, see your personal caregiver in time02 for a re-check. SEEK MEDICAL CARE IF:  There is a feeling of worsening weakness.  You experience repeated chest palpitations.  You are diabetic and  having difficulty keeping your blood sugars in the normal range.  You are experiencing vomiting and/or diarrhea.  You are having difficulty with any of your regular medications. SEEK IMMEDIATE MEDICAL CARE IF:  You experience chest pain, shortness of breath, or episodes of dizziness.  You have  been having vomiting or diarrhea for more than 2 days.  You have a fainting episode. MAKE SURE YOU:   Understand these instructions.  Will watch your condition.  Will get help right away if you are not doing well or get worse. Document Released: 01/08/2005 Document Revised: 04/02/2011 Document Reviewed: 12/20/2007 [facility] Patient Information 2014 [place], [place].  Foods Rich in Potassium Food / Potassium (mg)  Apricots, dried,  cup / 378 mg   Apricots, raw, 1 cup halves / 401 mg   Avocado,  / 487 mg   Banana, 1 large / 487 mg   Beef, lean, round, 3 oz / 202 mg   Cantaloupe, 1 cup cubes / 427 mg   Dates, medjool, 5 whole / 835 mg   Ham, cured, 3 oz / 212 mg   Lentils, dried,  cup / 458 mg   Lima beans, frozen,  cup / 258 mg   Orange, 1 large / 333 mg   Orange juice, 1 cup / 443 mg   Peaches, dried,  cup / 398 mg   Peas, split, cooked,  cup / 355 mg   Potato, boiled, 1 medium / 515 mg   Prunes, dried, uncooked,  cup / 318 mg   Raisins,  cup / 309 mg   Salmon, pink, raw, 3 oz / 275 mg   Sardines, canned , 3 oz / 338 mg   Tomato, raw, 1 medium / 292 mg   Tomato juice, 6 oz / 417 mg   [place], 3 oz / 349 mg  Document Released: 01/08/2005 Document Revised: 09/20/2010 Document Reviewed: 05/24/2008 [facility] Patient Information 2012 [place], [place]. Multiple Sclerosis Multiple sclerosis (MS) is a disease of the central nervous system. Its cause is unknown. It is more common in the [place] states than in the [place] states. There is a higher incidence of MS in women. There is a wide variation in the symptoms (problems) of MS. This is because of the many different ways it affects the central nervous system. It often comes on in episodes or attacks. These attacks may last weeks to months. There may be long periods of nearly no problems between attacks. The main symptoms include visual problems (associated with eye pain), numbness, weakness, and paralysis in  extremities (arms/hands and legs/feet). There may also be tremors and problems with balance and walking. The age when MS starts is variable. Advances in medicine continue to improve the treatment of this illness. There is no known cure for MS but there are medications that help. MS is not an inherited illness, although your risk of getting this disease is higher if you have a relative with MS. The best radiologic (x-ray) study for MS is an MRI (magnetic resonance imaging). There are medications available to decrease the number and frequency of attacks. SYMPTOMS  The symptoms of MS are caused by loss of insulation (myelin) of the nerves of the brain. When this happens, brain signals do not get transmitted properly or may not get transmitted at all. Some of the problems caused by this include:   Numbness.  Weakness.  Paralysis in extremities.  Visual problems, eye pain.  Balance problems.  Tremors. DIAGNOSIS  Your caregiver can do studies  on you to make this diagnosis. This may include specialized X-rays and spinal fluid studies. HOME CARE INSTRUCTIONS   Take medications as directed by your caregiver. Baclofen is a drug commonly used to reduce muscle spasticity. Steroids are often used for short term relief.  Exercise as directed.  Use physical and occupational therapy as directed by your caregiver. Careful attention to this medical care can help avoid depression.  See your caregiver if you begin to have problems with depression. This is a common problem in MS. Patients often continue to work many years after the diagnosis of MS. Document Released: 01/06/2000 Document Revised: 04/02/2011 Document Reviewed: 08/14/2006 [facility] Patient Information 2014 [place], [place].

## 2012-08-19 ENCOUNTER — Other Ambulatory Visit: Payer: Self-pay | Admitting: Internal Medicine

## 2012-08-27 NOTE — Telephone Encounter (Signed)
Medication refill

## 2012-08-28 ENCOUNTER — Telehealth: Payer: Self-pay | Admitting: Family Medicine

## 2012-08-28 NOTE — Telephone Encounter (Signed)
Son calling on behalf of pt to request medication refill; meclizine (ANTIVERT) 25 MG tablet  Take 25 mg by mouth 3 (three) times daily as needed for dizziness. , Informant: Self  metoprolol tartrate (LOPRESSOR) 25 MG tablet  TAKE 1 TABLET BY MOUTH 2 TIMES DAILY, Normal

## 2012-09-05 ENCOUNTER — Inpatient Hospital Stay (HOSPITAL_COMMUNITY)
Admission: EM | Admit: 2012-09-05 | Discharge: 2012-09-08 | DRG: 060 | Disposition: A | Discharge status: Home / Self Care | Payer: Medicaid Other | Attending: Internal Medicine | Admitting: Internal Medicine

## 2012-09-05 ENCOUNTER — Encounter (HOSPITAL_COMMUNITY): Payer: Self-pay | Admitting: *Deleted

## 2012-09-05 DIAGNOSIS — G459 Transient cerebral ischemic attack, unspecified: Secondary | ICD-10-CM

## 2012-09-05 DIAGNOSIS — H8110 Benign paroxysmal vertigo, unspecified ear: Secondary | ICD-10-CM

## 2012-09-05 DIAGNOSIS — F17200 Nicotine dependence, unspecified, uncomplicated: Secondary | ICD-10-CM

## 2012-09-05 DIAGNOSIS — R197 Diarrhea, unspecified: Secondary | ICD-10-CM | POA: Diagnosis not present

## 2012-09-05 DIAGNOSIS — K5900 Constipation, unspecified: Secondary | ICD-10-CM

## 2012-09-05 DIAGNOSIS — G35 Multiple sclerosis: Principal | ICD-10-CM | POA: Diagnosis present

## 2012-09-05 DIAGNOSIS — R202 Paresthesia of skin: Secondary | ICD-10-CM

## 2012-09-05 DIAGNOSIS — R000 Tachycardia, unspecified: Secondary | ICD-10-CM | POA: Diagnosis present

## 2012-09-05 DIAGNOSIS — R29898 Other symptoms and signs involving the musculoskeletal system: Secondary | ICD-10-CM | POA: Diagnosis present

## 2012-09-05 DIAGNOSIS — Z7982 Long term (current) use of aspirin: Secondary | ICD-10-CM

## 2012-09-05 DIAGNOSIS — Z8673 Personal history of transient ischemic attack (TIA), and cerebral infarction without residual deficits: Secondary | ICD-10-CM

## 2012-09-05 DIAGNOSIS — E876 Hypokalemia: Secondary | ICD-10-CM | POA: Diagnosis present

## 2012-09-05 DIAGNOSIS — Z87891 Personal history of nicotine dependence: Secondary | ICD-10-CM

## 2012-09-05 DIAGNOSIS — D72829 Elevated white blood cell count, unspecified: Secondary | ICD-10-CM

## 2012-09-05 DIAGNOSIS — E785 Hyperlipidemia, unspecified: Secondary | ICD-10-CM

## 2012-09-05 DIAGNOSIS — I10 Essential (primary) hypertension: Secondary | ICD-10-CM | POA: Diagnosis present

## 2012-09-05 DIAGNOSIS — F329 Major depressive disorder, single episode, unspecified: Secondary | ICD-10-CM

## 2012-09-05 DIAGNOSIS — Z794 Long term (current) use of insulin: Secondary | ICD-10-CM

## 2012-09-05 DIAGNOSIS — L0231 Cutaneous abscess of buttock: Secondary | ICD-10-CM

## 2012-09-05 DIAGNOSIS — IMO0002 Reserved for concepts with insufficient information to code with codable children: Secondary | ICD-10-CM

## 2012-09-05 DIAGNOSIS — E119 Type 2 diabetes mellitus without complications: Secondary | ICD-10-CM | POA: Diagnosis present

## 2012-09-05 HISTORY — DX: Cerebral infarction, unspecified: I63.9

## 2012-09-05 LAB — CBC WITH DIFFERENTIAL/PLATELET
Basophils Absolute: 0 10*3/uL (ref 0.0–0.1)
Eosinophils Absolute: 0 10*3/uL (ref 0.0–0.7)
Eosinophils Relative: 0 % (ref 0–5)
HCT: 40.9 % (ref 36.0–46.0)
Lymphocytes Relative: 5 % — ABNORMAL LOW (ref 12–46)
Lymphs Abs: 0.7 10*3/uL (ref 0.7–4.0)
MCH: 32.6 pg (ref 26.0–34.0)
MCV: 90.7 fL (ref 78.0–100.0)
Monocytes Absolute: 0.5 10*3/uL (ref 0.1–1.0)
Platelets: 298 10*3/uL (ref 150–400)
RDW: 12.2 % (ref 11.5–15.5)

## 2012-09-05 LAB — COMPREHENSIVE METABOLIC PANEL
ALT: 16 U/L (ref 0–35)
CO2: 32 mEq/L (ref 19–32)
Calcium: 10.4 mg/dL (ref 8.4–10.5)
Creatinine, Ser: 0.64 mg/dL (ref 0.50–1.10)
GFR calc Af Amer: >90 mL/min (ref >90)
GFR calc non Af Amer: >90 mL/min (ref >90)
Glucose, Bld: 119 mg/dL — ABNORMAL HIGH (ref 70–99)
Sodium: 141 mEq/L (ref 135–145)
Total Protein: 8 g/dL (ref 6.0–8.3)

## 2012-09-05 LAB — POCT I-STAT TROPONIN I

## 2012-09-05 MED ORDER — POTASSIUM CHLORIDE 10 MEQ/100ML IV SOLN
10.0000 meq | INTRAVENOUS | Status: AC
  Administered 2012-09-06 (×2): 10 meq via INTRAVENOUS
  Filled 2012-09-05: qty 200

## 2012-09-05 MED ORDER — POTASSIUM CHLORIDE CRYS ER 20 MEQ PO TBCR
60.0000 meq | EXTENDED_RELEASE_TABLET | Freq: Once | ORAL | Status: AC
  Administered 2012-09-05: 60 meq via ORAL
  Filled 2012-09-05: qty 3

## 2012-09-05 NOTE — ED Provider Notes (Signed)
CSN: [id number]     Arrival date & time 09/05/12  1930 History     First MD Initiated Contact with Patient 09/05/12 2326     Chief Complaint  Patient presents with  . Numbness   (Consider location/radiation/quality/duration/timing/severity/associated sxs/prior Treatment) HPI Comments: Patient has history of multiple sclerosis, not on treatment. She presents with her son with concern for TIA symptoms. Her son reports she said intermittent right arm numbness and weakness for the past 3 days. Some change in speech pattern as well. She's had difficulty walking and dragging her right leg. She denies any headache, dizziness, lightheadedness, chest pain or shortness of breath. No abdominal pain. She endorses frequent diarrhea and has had low potassium problems in the past. Denies abdominal pain or vomiting. No fever. She currently does not have a neurologist. She had an admission for TIA in May that was unremarkable.  The history is provided by the patient and a relative.    Past Medical History  Diagnosis Date  . Multiple sclerosis   . HYPERTENSION 09/30/2006  . DIABETES MELLITUS, TYPE II 09/30/2006  . TIA (transient ischemic attack)   . TOBACCO USER 09/30/2006    Qualifier: Diagnosis of  By: Delrae Alfred MD, Lanora Manis    . Multiple sclerosis, relapsing-remitting 05/29/2012  . DYSLIPIDEMIA 09/30/2006    Qualifier: Diagnosis of  By: Delrae Alfred MD, Lanora Manis    . DIABETES MELLITUS, TYPE II 09/30/2006    Qualifier: Diagnosis of  By: Delrae Alfred MD, Lanora Manis    . BENIGN POSITIONAL VERTIGO 01/27/2010    Qualifier: Diagnosis of  By: Delrae Alfred MD, Lanora Manis    . Stroke    History reviewed. No pertinent past surgical history. Family History  Problem Relation Age of Onset  . Hypertension Mother   . Kidney failure Mother   . Hypertension Father   . Gout Father   . Hypertension Sister   . Diabetes Sister   . Hypertension Brother    History  Substance Use Topics  . Smoking status: Former Smoker -- 0.25  packs/day  . Smokeless tobacco: Not on file  . Alcohol Use: No   OB History   Grav Para Term Preterm Abortions TAB SAB Ect Mult Living                 Review of Systems  Constitutional: Negative for fever, activity change, appetite change and fatigue.  HENT: Negative for congestion and rhinorrhea.   Respiratory: Negative for cough, chest tightness and shortness of breath.   Cardiovascular: Negative for chest pain.  Gastrointestinal: Negative for nausea, vomiting and abdominal pain.  Genitourinary: Negative for dysuria, hematuria, vaginal bleeding and vaginal discharge.  Musculoskeletal: Positive for gait problem.  Skin: Negative for rash.  Neurological: Positive for speech difficulty, weakness and numbness. Negative for dizziness and headaches.  A complete 10 system review of systems was obtained and all systems are negative except as noted in the HPI and PMH.     Allergies  Review of patient's allergies indicates no known allergies.  Home Medications   No current outpatient prescriptions on file. BP 172/90  Pulse 81  Temp(Src) 98.7 F (37.1 C) (Oral)  Resp 18  Ht 5\' 1"  (1.549 m)  Wt 115 lb (52.164 kg)  BMI 21.74 kg/m2  SpO2 100%  LMP 08/23/2012 Physical Exam  Constitutional: She is oriented to person, place, and time. She appears well-developed and well-nourished. No distress.  HENT:  Head: Normocephalic and atraumatic.  Mouth/Throat: No oropharyngeal exudate.  Eyes: Conjunctivae and EOM are normal.  Pupils are equal, round, and reactive to light.  Neck: Normal range of motion. Neck supple.  Cardiovascular: Normal rate, regular rhythm and normal heart sounds.   No murmur heard. tachycardia  Pulmonary/Chest: Effort normal and breath sounds normal. No respiratory distress.  Abdominal: Soft. There is no tenderness. There is no rebound and no guarding.  Musculoskeletal: Normal range of motion. She exhibits no edema and no tenderness.  Neurological: She is alert and  oriented to person, place, and time. No cranial nerve deficit. She exhibits normal muscle tone. Coordination normal.  Slightly decreased grip strength on R, with weak flexion and extension of elbow. 5/5 strength LUE, BLE. No ataxia on finger to nose, CN 2-12 intact  Skin: Skin is warm.    ED Course   Procedures (including critical care time)  Labs Reviewed  CBC WITH DIFFERENTIAL - Abnormal; Notable for the following:    WBC 13.6 (*)    Neutrophils Relative % 91 (*)    Neutro Abs 12.4 (*)    Lymphocytes Relative 5 (*)    All other components within normal limits  COMPREHENSIVE METABOLIC PANEL - Abnormal; Notable for the following:    Potassium 2.4 (*)    Glucose, Bld 119 (*)    Total Bilirubin 1.3 (*)    All other components within normal limits  CLOSTRIDIUM DIFFICILE BY PCR  STOOL CULTURE  MAGNESIUM  COMPREHENSIVE METABOLIC PANEL  MAGNESIUM  TSH  CBC WITH DIFFERENTIAL  URINE RAPID DRUG SCREEN (HOSP PERFORMED)  PREGNANCY, URINE  POCT I-STAT TROPONIN I   Ct Head Wo Contrast  09/06/2012   *RADIOLOGY REPORT*  Clinical Data: 42 year old female with right upper lower extremity numbness.  Vertigo.  Multiple sclerosis.  CT HEAD WITHOUT CONTRAST  Technique:  Contiguous axial images were obtained from the base of the skull through the vertex without contrast.  Comparison: Brain MRI 05/28/2012 and earlier.  Findings: Visualized paranasal sinuses and mastoids are clear. Visualized orbits and scalp soft tissues are within normal limits. No acute osseous abnormality identified.  Mild Calcified atherosclerosis at the skull base.  Patchy and confluent cerebral white matter hypodensity has not significantly changed. No midline shift, mass effect, or evidence of mass lesion. No ventriculomegaly. No acute intracranial hemorrhage identified. No evidence of cortically based acute infarction identified.  No suspicious intracranial vascular hyperdensity.  IMPRESSION: Stable advanced white matter disease.   No new intracranial abnormality identified.   Original Report Authenticated By: Erskine Speed, M.D.   1. Paresthesias   2. Hypokalemia   3. Multiple sclerosis   4. Right leg weakness     MDM  Hx multiple sclerosis, Right arm numbness and weakness for the past 3 days. Frequent diarrhea, with recurrent hypokalemia.   Patient received 60 mEq of by mouth potassium in triage. We'll check magnesium. Discussed with Dr. Thad Ranger of neurology. She agrees the patient needs MRI to delineates CVA or TIA versus MS exacerbation. She will consult on patient.  Magnesium is normal.  Continue to replace KCl. CT head negative. Dr. Thad Ranger feels patient's exam it is at baseline. She doubts TIA or MS execerbation.  But feels patient would benefit from MRI.    Date: 09/05/2012  Rate: 109  Rhythm: sinus tachycardia  QRS Axis: normal  Intervals: normal  ST/T Wave abnormalities: nonspecific ST/T changes  Conduction Disutrbances:none  Narrative Interpretation: U waves in anterior leads  Old EKG Reviewed: changes noted       Glynn Octave, MD 09/06/12 [phone]

## 2012-09-05 NOTE — ED Notes (Signed)
The pt has had rt arm numbness for 3 days.  ?? Speech difficulty none now no pain.  She has had tias in the past

## 2012-09-06 ENCOUNTER — Inpatient Hospital Stay (HOSPITAL_COMMUNITY): Payer: Medicaid Other

## 2012-09-06 ENCOUNTER — Encounter (HOSPITAL_COMMUNITY): Payer: Self-pay | Admitting: Internal Medicine

## 2012-09-06 ENCOUNTER — Emergency Department (HOSPITAL_COMMUNITY): Payer: Medicaid Other

## 2012-09-06 DIAGNOSIS — G35 Multiple sclerosis: Secondary | ICD-10-CM

## 2012-09-06 DIAGNOSIS — E119 Type 2 diabetes mellitus without complications: Secondary | ICD-10-CM

## 2012-09-06 DIAGNOSIS — R29898 Other symptoms and signs involving the musculoskeletal system: Secondary | ICD-10-CM

## 2012-09-06 DIAGNOSIS — R209 Unspecified disturbances of skin sensation: Secondary | ICD-10-CM

## 2012-09-06 DIAGNOSIS — E876 Hypokalemia: Secondary | ICD-10-CM

## 2012-09-06 DIAGNOSIS — I10 Essential (primary) hypertension: Secondary | ICD-10-CM

## 2012-09-06 LAB — RAPID URINE DRUG SCREEN, HOSP PERFORMED
Amphetamines: NOT DETECTED
Barbiturates: NOT DETECTED
Benzodiazepines: NOT DETECTED
Cocaine: NOT DETECTED
Tetrahydrocannabinol: NOT DETECTED

## 2012-09-06 LAB — CBC WITH DIFFERENTIAL/PLATELET
Eosinophils Absolute: 0.1 10*3/uL (ref 0.0–0.7)
Hemoglobin: 12.5 g/dL (ref 12.0–15.0)
Lymphocytes Relative: 23 % (ref 12–46)
Lymphs Abs: 2.4 10*3/uL (ref 0.7–4.0)
MCH: 32.4 pg (ref 26.0–34.0)
Monocytes Relative: 9 % (ref 3–12)
Neutrophils Relative %: 66 % (ref 43–77)
RBC: 3.86 MIL/uL — ABNORMAL LOW (ref 3.87–5.11)

## 2012-09-06 LAB — COMPREHENSIVE METABOLIC PANEL
ALT: 11 U/L (ref 0–35)
Albumin: 3.5 g/dL (ref 3.5–5.2)
Alkaline Phosphatase: 58 U/L (ref 39–117)
BUN: 8 mg/dL (ref 6–23)
Potassium: 3.5 mEq/L (ref 3.5–5.1)
Sodium: 139 mEq/L (ref 135–145)
Total Protein: 6.2 g/dL (ref 6.0–8.3)

## 2012-09-06 LAB — MAGNESIUM: Magnesium: 2 mg/dL (ref 1.5–2.5)

## 2012-09-06 LAB — GLUCOSE, CAPILLARY: Glucose-Capillary: 138 mg/dL — ABNORMAL HIGH (ref 70–99)

## 2012-09-06 LAB — PREGNANCY, URINE: Preg Test, Ur: NEGATIVE

## 2012-09-06 MED ORDER — ENOXAPARIN SODIUM 40 MG/0.4ML ~~LOC~~ SOLN
40.0000 mg | SUBCUTANEOUS | Status: DC
  Administered 2012-09-06 – 2012-09-08 (×3): 40 mg via SUBCUTANEOUS
  Filled 2012-09-06 (×3): qty 0.4

## 2012-09-06 MED ORDER — METOPROLOL TARTRATE 25 MG PO TABS
25.0000 mg | ORAL_TABLET | Freq: Two times a day (BID) | ORAL | Status: DC
  Administered 2012-09-06 – 2012-09-08 (×5): 25 mg via ORAL
  Filled 2012-09-06 (×6): qty 1

## 2012-09-06 MED ORDER — LORAZEPAM 2 MG/ML IJ SOLN
1.0000 mg | Freq: Once | INTRAMUSCULAR | Status: AC | PRN
  Administered 2012-09-06: 1 mg via INTRAVENOUS
  Filled 2012-09-06: qty 1

## 2012-09-06 MED ORDER — POTASSIUM CHLORIDE CRYS ER 20 MEQ PO TBCR
20.0000 meq | EXTENDED_RELEASE_TABLET | Freq: Every day | ORAL | Status: DC
  Administered 2012-09-06 – 2012-09-08 (×3): 20 meq via ORAL
  Filled 2012-09-06 (×4): qty 1

## 2012-09-06 MED ORDER — MECLIZINE HCL 25 MG PO TABS
25.0000 mg | ORAL_TABLET | Freq: Three times a day (TID) | ORAL | Status: DC | PRN
Start: 2012-09-06 — End: 2012-09-08
  Administered 2012-09-06: 25 mg via ORAL
  Filled 2012-09-06 (×2): qty 1

## 2012-09-06 MED ORDER — HYDROCHLOROTHIAZIDE 25 MG PO TABS
25.0000 mg | ORAL_TABLET | Freq: Every day | ORAL | Status: DC
  Administered 2012-09-06 – 2012-09-08 (×3): 25 mg via ORAL
  Filled 2012-09-06 (×3): qty 1

## 2012-09-06 MED ORDER — GADOBENATE DIMEGLUMINE 529 MG/ML IV SOLN
10.0000 mL | Freq: Once | INTRAVENOUS | Status: AC | PRN
  Administered 2012-09-06: 10 mL via INTRAVENOUS

## 2012-09-06 MED ORDER — ASPIRIN 81 MG PO CHEW
81.0000 mg | CHEWABLE_TABLET | Freq: Every day | ORAL | Status: DC
  Administered 2012-09-06 – 2012-09-08 (×3): 81 mg via ORAL
  Filled 2012-09-06 (×3): qty 1

## 2012-09-06 MED ORDER — ONDANSETRON HCL 4 MG/2ML IJ SOLN: 4.0000 mg | Freq: Three times a day (TID) | INTRAMUSCULAR | Status: DC | PRN

## 2012-09-06 MED ORDER — SODIUM CHLORIDE 0.9 % IV SOLN
INTRAVENOUS | Status: AC
  Administered 2012-09-06: 07:00:00 via INTRAVENOUS
  Filled 2012-09-06 (×2): qty 1000

## 2012-09-06 MED ORDER — SODIUM CHLORIDE 0.9 % IV SOLN
1000.0000 mg | Freq: Every day | INTRAVENOUS | Status: AC
  Administered 2012-09-06 – 2012-09-08 (×3): 1000 mg via INTRAVENOUS
  Filled 2012-09-06 (×4): qty 8

## 2012-09-06 MED ORDER — INSULIN ASPART 100 UNIT/ML ~~LOC~~ SOLN
0.0000 [IU] | Freq: Three times a day (TID) | SUBCUTANEOUS | Status: DC
  Administered 2012-09-07: 3 [IU] via SUBCUTANEOUS
  Administered 2012-09-07: 2 [IU] via SUBCUTANEOUS
  Administered 2012-09-07: 1 [IU] via SUBCUTANEOUS

## 2012-09-06 NOTE — Progress Notes (Signed)
Received patient from ER awake, alert and oriented x 4. Pt stated that she is "fine". Denies pain but reported that she suffers from vertigo. Patient gingerly transferred from stretcher to bed with one assist. Patient oriented to room, call bell, telephone and night table in close reach.  Pt was educated on " Bed alarm " safety precaution and a bedside commode was provided. Patient verbalized understanding of same. We will continue to monitor.

## 2012-09-06 NOTE — Progress Notes (Signed)
MRI brain revealed new areas of active demyelinating plaques. Results conveyed to patient. I proposed 3 days IV solumedrol and she agreed.  [name] ,MD [facility]

## 2012-09-06 NOTE — ED Notes (Signed)
Patient transported to CT 

## 2012-09-06 NOTE — Progress Notes (Signed)
PT Cancellation Note  Patient Details Name: LYNNLEIGH SODEN MRN: [id number] DOB: 11/06/1970   Cancelled Treatment:    Reason Eval/Treat Not Completed: Patient at procedure or test/unavailable Pt off the floor at MRI. PT to return as able.   Marcene Brawn 09/06/2012, 1:59 PM

## 2012-09-06 NOTE — Progress Notes (Signed)
PATIENT DETAILS Name: [name] [name] Age: 42 y.o. Sex: female Date of Birth: May 08, 1970 Admit Date: 09/05/2012 Admitting Physician Eduard Clos, MD [id number] Laural Benes, MD  Subjective: No major complaints this am  Assessment/Plan: Principal Problem:   Right sided weakness -suspect she is back to baseline -differentials include possible exacerbation of multiple sclerosis versus TIA/CVA -await MRI Brain -Neuro following -c/w ASA  Active Problems: Hx of MS -needs referral to Neuro by PCP  Hypokalemia -resolved with repletion-will start daily supplementation -secondary to diarrhea and HCTZ -monitor  DIABETES MELLITUS, TYPE II -CBG's stable with SSI -resume Metformin on discharge  HYPERTENSION -cautiously continue with HCTZ and Metoprolol-BP on the higher side-monitor for 1 more day-before adjusting   Hypokalemia  Disposition: Remain inpatient  DVT Prophylaxis: Prophylactic Lovenox  Code Status: Full code   Family Communication None at bedside  Procedures:  None  CONSULTS:  neurology   MEDICATIONS: Scheduled Meds: . aspirin  81 mg Oral Daily  . hydrochlorothiazide  25 mg Oral Daily  . insulin aspart  0-9 Units Subcutaneous TID WC  . metoprolol tartrate  25 mg Oral BID   Continuous Infusions: . sodium chloride 0.9 % 1,000 mL with potassium chloride 20 mEq infusion 75 mL/hr at 09/06/12 0704   PRN Meds:.LORazepam, meclizine  Antibiotics: Anti-infectives   None       PHYSICAL EXAM: Vital signs in last 24 hours: Filed Vitals:   09/06/12 0200 09/06/12 0254 09/06/12 0444 09/06/12 0955  BP: 174/97 174/97 172/90 169/99  Pulse: 82 83 81 86  Temp:  98.2 F (36.8 C) 98.7 F (37.1 C)   TempSrc:  Oral Oral   Resp: 17 18 18    Height:   5\' 1"  (1.549 m)   Weight:   52.164 kg (115 lb)   SpO2: 100% 100% 100%     Weight change:  Filed Weights   09/06/12 0444  Weight: 52.164 kg (115 lb)   Body mass index is 21.74 kg/(m^2).   Gen  Exam: Awake and alert with clear speech.   Neck: Supple, No JVD.   Chest: B/L Clear.   CVS: S1 S2 Regular, no murmurs.  Abdomen: soft, BS +, non tender, non distended.  Extremities: no edema, lower extremities warm to touch. Neurologic: Non Focal.   Skin: No Rash.  Wounds: N/A.    Intake/Output from previous day:  Intake/Output Summary (Last 24 hours) at 09/06/12 1101 Last data filed at 09/06/12 0600  Gross per 24 hour  Intake    240 ml  Output      0 ml  Net    240 ml     LAB RESULTS: CBC  Recent Labs Lab 09/05/12 2008 09/06/12 0925  WBC 13.6* 10.5  HGB 14.7 12.5  HCT 40.9 34.5*  PLT 298 239  MCV 90.7 89.4  MCH 32.6 32.4  MCHC 35.9 36.2*  RDW 12.2 12.1  LYMPHSABS 0.7 2.4  MONOABS 0.5 1.0  EOSABS 0.0 0.1  BASOSABS 0.0 0.0    Chemistries   Recent Labs Lab 09/05/12 2000 09/05/12 2008 09/06/12 0925  NA  --  141 139  K  --  2.4* 3.5  CL  --  97 106  CO2  --  32 25  GLUCOSE  --  119* 100*  BUN  --  12 8  CREATININE  --  0.64 0.50  CALCIUM  --  10.4 9.1  MG 2.2  --  2.0    CBG:  Recent Labs Lab 09/06/12 0747  GLUCAP 101*  GFR Estimated Creatinine Clearance: 69.1 ml/min (by C-G formula based on Cr of 0.5).  Coagulation profile No results found for this basename: INR, PROTIME,  in the last 168 hours  Cardiac Enzymes No results found for this basename: CK, CKMB, TROPONINI, MYOGLOBIN,  in the last 168 hours  No components found with this basename: POCBNP,  No results found for this basename: DDIMER,  in the last 72 hours No results found for this basename: HGBA1C,  in the last 72 hours No results found for this basename: CHOL, HDL, LDLCALC, TRIG, CHOLHDL, LDLDIRECT,  in the last 72 hours No results found for this basename: TSH, T4TOTAL, FREET3, T3FREE, THYROIDAB,  in the last 72 hours No results found for this basename: VITAMINB12, FOLATE, FERRITIN, TIBC, IRON, RETICCTPCT,  in the last 72 hours No results found for this basename: LIPASE,  AMYLASE,  in the last 72 hours  Urine Studies No results found for this basename: UACOL, UAPR, USPG, UPH, UTP, UGL, UKET, UBIL, UHGB, UNIT, UROB, ULEU, UEPI, UWBC, URBC, UBAC, CAST, CRYS, UCOM, BILUA,  in the last 72 hours  MICROBIOLOGY: No results found for this or any previous visit (from the past 240 hour(s)).  RADIOLOGY STUDIES/RESULTS: Ct Head Wo Contrast  09/06/2012   *RADIOLOGY REPORT*  Clinical Data: 42 year old female with right upper lower extremity numbness.  Vertigo.  Multiple sclerosis.  CT HEAD WITHOUT CONTRAST  Technique:  Contiguous axial images were obtained from the base of the skull through the vertex without contrast.  Comparison: Brain MRI 05/28/2012 and earlier.  Findings: Visualized paranasal sinuses and mastoids are clear. Visualized orbits and scalp soft tissues are within normal limits. No acute osseous abnormality identified.  Mild Calcified atherosclerosis at the skull base.  Patchy and confluent cerebral white matter hypodensity has not significantly changed. No midline shift, mass effect, or evidence of mass lesion. No ventriculomegaly. No acute intracranial hemorrhage identified. No evidence of cortically based acute infarction identified.  No suspicious intracranial vascular hyperdensity.  IMPRESSION: Stable advanced white matter disease.  No new intracranial abnormality identified.   Original Report Authenticated By: Erskine Speed, M.D.    Jeoffrey Massed, MD  [facility] Pager:[phone]  If 7PM-7AM, please contact night-coverage [email or web address] Password [email or web address] 09/06/2012, 11:01 AM   LOS: 1 day

## 2012-09-06 NOTE — H&P (Signed)
[facility] History and Physical  [name] [name] JXB:[id number] DOB: 11-11-1970 DOA: 09/05/2012  Referring physician: ER physician. PCP: Standley Dakins, MD   Chief Complaint: Right lower extremity weakness and numbness.  HPI: [name] [name] is a 42 y.o. female with history of multiple sclerosis, TIA, diabetes mellitus type 2, hypertension presented to the year because of right lower extremity weakness and numbness. Patient has been having these symptoms for last 2 days. Denies any difficulty speaking or swallowing the patient has mild slurred speech. Denies any weakness in the left upper lower extremities. In the ER CT head was negative for anything acute. Labs show hypokalemia. Patient has been admitted for further management. Neurologist on-call Dr. Thad Ranger has seen the patient and has advised MRI brain. Patient also has been experiencing multiple episodes of diarrhea yesterday. Denies any nausea vomiting or any recent use of antibiotics hospitalization.  Review of Systems: As presented in the history of presenting illness, rest negative.  Past Medical History  Diagnosis Date  . Multiple sclerosis   . HYPERTENSION 09/30/2006  . DIABETES MELLITUS, TYPE II 09/30/2006  . TIA (transient ischemic attack)   . TOBACCO USER 09/30/2006    Qualifier: Diagnosis of  By: Delrae Alfred MD, Lanora Manis    . Multiple sclerosis, relapsing-remitting 05/29/2012  . DYSLIPIDEMIA 09/30/2006    Qualifier: Diagnosis of  By: Delrae Alfred MD, Lanora Manis    . DIABETES MELLITUS, TYPE II 09/30/2006    Qualifier: Diagnosis of  By: Delrae Alfred MD, Lanora Manis    . BENIGN POSITIONAL VERTIGO 01/27/2010    Qualifier: Diagnosis of  By: Delrae Alfred MD, Lanora Manis    . Stroke    History reviewed. No pertinent past surgical history. Social History:  reports that she has quit smoking. She does not have any smokeless tobacco history on file. She reports that she does not drink alcohol. Her drug history is not on file. Home. where does  patient live-- Can do ADLs. Can patient participate in ADLs?  No Known Allergies  Family History  Problem Relation Age of Onset  . Hypertension Mother   . Kidney failure Mother   . Hypertension Father   . Gout Father   . Hypertension Sister   . Diabetes Sister   . Hypertension Brother       Prior to Admission medications   Medication Sig Start Date End Date Taking? Authorizing Provider  aspirin 81 MG tablet Take 81 mg by mouth daily.   Yes Historical Provider, MD  hydrochlorothiazide (HYDRODIURIL) 25 MG tablet Take 25 mg by mouth daily.   Yes Historical Provider, MD  meclizine (ANTIVERT) 25 MG tablet Take 25 mg by mouth 3 (three) times daily as needed for dizziness or nausea.    Yes Historical Provider, MD  metFORMIN (GLUCOPHAGE) 500 MG tablet Take 500 mg by mouth daily with breakfast.    Yes Historical Provider, MD  metoprolol tartrate (LOPRESSOR) 25 MG tablet Take 25 mg by mouth 2 (two) times daily.   Yes Historical Provider, MD  POTASSIUM CHLORIDE PO Take 1 tablet by mouth daily.   Yes Historical Provider, MD   Physical Exam: Filed Vitals:   09/06/12 0145 09/06/12 0200 09/06/12 0254 09/06/12 0444  BP:  174/97 174/97 172/90  Pulse: 99 82 83 81  Temp:   98.2 F (36.8 C) 98.7 F (37.1 C)  TempSrc:   Oral Oral  Resp: 14 17 18 18   Height:    5\' 1"  (1.549 m)  Weight:    52.164 kg (115 lb)  SpO2: 100%  100% 100% 100%     General:  Well-developed moderately nourished.  Eyes: Anicteric no pallor.  ENT: No discharge from ears eyes nose mouth.  Neck: No mass felt.  Cardiovascular: S1-S2 heard.  Respiratory: No rhonchi or crepitations.  Abdomen: Soft nontender bowel sounds present.  Skin: No rash.  Musculoskeletal: No edema.  Psychiatric: Appears normal.  Neurologic: Alert awake oriented to time place and person. Has mild weakness in the right upper and lower extremities. Left upper and lower extremities 5 x 5. No facial asymmetry or tongue deviation.  Labs on  Admission:  Basic Metabolic Panel:  Recent Labs Lab 09/05/12 2000 09/05/12 2008  NA  --  141  K  --  2.4*  CL  --  97  CO2  --  32  GLUCOSE  --  119*  BUN  --  12  CREATININE  --  0.64  CALCIUM  --  10.4  MG 2.2  --    Liver Function Tests:  Recent Labs Lab 09/05/12 2008  AST 20  ALT 16  ALKPHOS 75  BILITOT 1.3*  PROT 8.0  ALBUMIN 4.7   No results found for this basename: LIPASE, AMYLASE,  in the last 168 hours No results found for this basename: AMMONIA,  in the last 168 hours CBC:  Recent Labs Lab 09/05/12 2008  WBC 13.6*  NEUTROABS 12.4*  HGB 14.7  HCT 40.9  MCV 90.7  PLT 298   Cardiac Enzymes: No results found for this basename: CKTOTAL, CKMB, CKMBINDEX, TROPONINI,  in the last 168 hours  BNP (last 3 results) No results found for this basename: PROBNP,  in the last 8760 hours CBG: No results found for this basename: GLUCAP,  in the last 168 hours  Radiological Exams on Admission: Ct Head Wo Contrast  09/06/2012   *RADIOLOGY REPORT*  Clinical Data: 42 year old female with right upper lower extremity numbness.  Vertigo.  Multiple sclerosis.  CT HEAD WITHOUT CONTRAST  Technique:  Contiguous axial images were obtained from the base of the skull through the vertex without contrast.  Comparison: Brain MRI 05/28/2012 and earlier.  Findings: Visualized paranasal sinuses and mastoids are clear. Visualized orbits and scalp soft tissues are within normal limits. No acute osseous abnormality identified.  Mild Calcified atherosclerosis at the skull base.  Patchy and confluent cerebral white matter hypodensity has not significantly changed. No midline shift, mass effect, or evidence of mass lesion. No ventriculomegaly. No acute intracranial hemorrhage identified. No evidence of cortically based acute infarction identified.  No suspicious intracranial vascular hyperdensity.  IMPRESSION: Stable advanced white matter disease.  No new intracranial abnormality identified.    Original Report Authenticated By: Erskine Speed, M.D.     Assessment/Plan Principal Problem:   Right leg weakness Active Problems:   DIABETES MELLITUS, TYPE II   HYPERTENSION   Hypokalemia   1. Right lower extremity weakness and numbness - differentials include possible exacerbation of multiple sclerosis versus TIA/CVA. MRI brain has been ordered. Replace potassium. 2. Hypokalemia - probably secondary to diarrhea and HCTZ. Replace and recheck. Check magnesium. 3. Diarrhea - denies any abdominal pain and has no fever. Abdomen is nontender. Check stool for C. difficile and cultures. 4. Diabetes mellitus type 2 - hold metformin. Sliding-scale coverage. 5. Hypertension - continue present medications. 6. History of multiple sclerosis remitting and relapsing type - per neurologist.    Code Status: Full code.  Family Communication: None.  Disposition Plan: Admit to inpatient.    Gwenetta Devos N. [facility]  Pager [phone].  If 7PM-7AM, please contact night-coverage [email or web address] Password [facility] 09/06/2012, 5:51 AM

## 2012-09-06 NOTE — Progress Notes (Signed)
RN stroke swallow test complete. Pt pass swallow test. No choking or coughing after eating crackers and drinking sips of  water.

## 2012-09-06 NOTE — Progress Notes (Signed)
INITIAL NUTRITION ASSESSMENT  DOCUMENTATION CODES Per approved criteria  -Not Applicable   INTERVENTION:  No nutrition intervention ---> patient declined RD to follow for nutrition care plan  NUTRITION DIAGNOSIS: Inadequate oral intake related to decreased appetite, weakness as evidenced by PO intake 30%  Goal: Pt to meet >/= 90% of their estimated nutrition needs   Monitor:  PO intake, weight, labs, I/O's  Reason for Assessment: Malnutrition Screening Tool Report  42 y.o. female  Admitting Dx: Right leg weakness  ASSESSMENT: Patient with PMH of multiple sclerosis, TIA, diabetes mellitus type 2 and HTN presented to [facility] because of right lower extremity weakness and numbness; in ER CT head was negative for anything acute; Neurology consulted ---> recommending MRI of brain.  Patient reports her appetite is ok; PO intake poor this AM for breakfast per Nurse Tech; patient denies recent weight loss; RD offered nutrition supplement (ie Glucerna Shake) between meals, however, patient declined.  Height: Ht Readings from Last 1 Encounters:  09/06/12 5\' 1"  (1.549 m)    Weight: Wt Readings from Last 1 Encounters:  09/06/12 115 lb (52.164 kg)    Ideal Body Weight: 105 lb  % Ideal Body Weight: 91%  Wt Readings from Last 10 Encounters:  09/06/12 115 lb (52.164 kg)  07/21/12 113 lb 12.8 oz (51.619 kg)  05/28/12 116 lb 2.9 oz (52.7 kg)  01/27/10 167 lb 5 oz (75.892 kg)  06/16/09 180 lb (81.647 kg)  04/14/09 183 lb 3 oz (83.093 kg)  03/16/09 188 lb (85.276 kg)  03/03/09 184 lb (83.462 kg)  12/02/08 191 lb 5 oz (86.779 kg)  10/09/07 189 lb (85.73 kg)    Usual Body Weight: 113 lb  % Usual Body Weight: 101%  BMI:  Body mass index is 21.74 kg/(m^2).  Estimated Nutritional Needs: Kcal: 1400-1600 Protein: 60-70 fm Fluid: >/= 1.5 L  Skin: Intact  Diet Order: Cardiac  EDUCATION NEEDS: -No education needs identified at this time   Intake/Output Summary (Last 24 hours)  at 09/06/12 1326 Last data filed at 09/06/12 0900  Gross per 24 hour  Intake    480 ml  Output      0 ml  Net    480 ml    Labs:   Recent Labs Lab 09/05/12 2000 09/05/12 2008 09/06/12 0925  NA  --  141 139  K  --  2.4* 3.5  CL  --  97 106  CO2  --  32 25  BUN  --  12 8  CREATININE  --  0.64 0.50  CALCIUM  --  10.4 9.1  MG 2.2  --  2.0  GLUCOSE  --  119* 100*    CBG (last 3)   Recent Labs  09/06/12 0747 09/06/12 1157  GLUCAP 101* 102*    Scheduled Meds: . aspirin  81 mg Oral Daily  . enoxaparin (LOVENOX) injection  40 mg Subcutaneous Q24H  . hydrochlorothiazide  25 mg Oral Daily  . insulin aspart  0-9 Units Subcutaneous TID WC  . metoprolol tartrate  25 mg Oral BID  . potassium chloride  20 mEq Oral Daily    Continuous Infusions: . sodium chloride 0.9 % 1,000 mL with potassium chloride 20 mEq infusion 75 mL/hr at 09/06/12 4540    Past Medical History  Diagnosis Date  . Multiple sclerosis   . HYPERTENSION 09/30/2006  . DIABETES MELLITUS, TYPE II 09/30/2006  . TIA (transient ischemic attack)   . TOBACCO USER 09/30/2006    Qualifier: Diagnosis  of  By: Delrae Alfred MD, Lanora Manis    . Multiple sclerosis, relapsing-remitting 05/29/2012  . DYSLIPIDEMIA 09/30/2006    Qualifier: Diagnosis of  By: Delrae Alfred MD, Lanora Manis    . DIABETES MELLITUS, TYPE II 09/30/2006    Qualifier: Diagnosis of  By: Delrae Alfred MD, Lanora Manis    . BENIGN POSITIONAL VERTIGO 01/27/2010    Qualifier: Diagnosis of  By: Delrae Alfred MD, Lanora Manis    . Stroke     History reviewed. No pertinent past surgical history.  Maureen Chatters, RD, LDN Pager #: [phone] After-Hours Pager #: [phone]

## 2012-09-06 NOTE — Consult Note (Addendum)
Reason for Consult:Weakness Referring Physician: Rancour  CC: Weakness  HPI: [name] [name] is an 42 y.o. female with a history of MS and TIA's.  She is a poor historian but per her report she was having some weakness on her right side today and fell.  She reports that her weakness is long-standing and related to her MS.  Although she feels at baseline at this time she reports that she always falls like this when her potasium is low.  Her son gives a different history and reports that she as very weak today. He reports a generalized weakness and reports that she was so weak she was unable to sit up straight.  He feels that overall her function is poor and that she needs some therapy but this is not a new issue.   After her last discharge it was recommended that she follow up with a neurologist.  This was not done.    Past Medical History  Diagnosis Date  . Multiple sclerosis   . HYPERTENSION 09/30/2006  . DIABETES MELLITUS, TYPE II 09/30/2006  . TIA (transient ischemic attack)   . TOBACCO USER 09/30/2006    Qualifier: Diagnosis of  By: Delrae Alfred MD, Lanora Manis    . Multiple sclerosis, relapsing-remitting 05/29/2012  . DYSLIPIDEMIA 09/30/2006    Qualifier: Diagnosis of  By: Delrae Alfred MD, Lanora Manis    . DIABETES MELLITUS, TYPE II 09/30/2006    Qualifier: Diagnosis of  By: Delrae Alfred MD, Lanora Manis    . BENIGN POSITIONAL VERTIGO 01/27/2010    Qualifier: Diagnosis of  By: Delrae Alfred MD, Lanora Manis    . Stroke     History reviewed. No pertinent past surgical history.  Family History  Problem Relation Age of Onset  . Hypertension Mother   . Kidney failure Mother   . Hypertension Father   . Gout Father   . Hypertension Sister   . Diabetes Sister   . Hypertension Brother     Social History:  reports that she has quit smoking. She does not have any smokeless tobacco history on file. She reports that she does not drink alcohol. Her drug history is not on file.  No Known Allergies  Medications: I have  reviewed the patient's current medications. Prior to Admission:  Current outpatient prescriptions:aspirin 81 MG tablet, Take 81 mg by mouth daily., Disp: , Rfl: ;  hydrochlorothiazide (HYDRODIURIL) 25 MG tablet, Take 25 mg by mouth daily., Disp: , Rfl: ;  meclizine (ANTIVERT) 25 MG tablet, Take 25 mg by mouth 3 (three) times daily as needed for dizziness or nausea. , Disp: , Rfl: ;  metFORMIN (GLUCOPHAGE) 500 MG tablet, Take 500 mg by mouth daily with breakfast. , Disp: , Rfl:  metoprolol tartrate (LOPRESSOR) 25 MG tablet, Take 25 mg by mouth 2 (two) times daily., Disp: , Rfl: ;  POTASSIUM CHLORIDE PO, Take 1 tablet by mouth daily., Disp: , Rfl:   ROS: History obtained from the patient  General ROS: negative for - chills, fatigue, fever, night sweats, weight gain or weight loss Psychological ROS: negative for - behavioral disorder, hallucinations, memory difficulties, mood swings or suicidal ideation Ophthalmic ROS: negative for - blurry vision, double vision, eye pain or loss of vision ENT ROS: negative for - epistaxis, nasal discharge, oral lesions, sore throat, tinnitus or vertigo Allergy and Immunology ROS: negative for - hives or itchy/watery eyes Hematological and Lymphatic ROS: negative for - bleeding problems, bruising or swollen lymph nodes Endocrine ROS: negative for - galactorrhea, hair pattern changes, polydipsia/polyuria or  temperature intolerance Respiratory ROS: negative for - cough, hemoptysis, shortness of breath or wheezing Cardiovascular ROS: negative for - chest pain, dyspnea on exertion, edema or irregular heartbeat Gastrointestinal ROS: negative for - abdominal pain, diarrhea, hematemesis, nausea/vomiting or stool incontinence Genito-Urinary ROS: negative for - dysuria, hematuria, incontinence or urinary frequency/urgency Musculoskeletal ROS: negative for - joint swelling or muscular weakness Neurological ROS: as noted in HPI Dermatological ROS: negative for rash and skin  lesion changes  Physical Examination: Blood pressure 146/92, pulse 107, temperature 98.4 F (36.9 C), temperature source Oral, resp. rate 16, last menstrual period 08/23/2012, SpO2 98.00%.  Neurologic Examination Mental Status: Alert, oriented, tangential.  Speech fluent without evidence of aphasia.  Able to follow 3 step commands without difficulty. Cranial Nerves: II: Discs flat bilaterally; Visual fields grossly normal, pupils equal, round, reactive to light and accommodation III,IV, VI: ptosis not present, extra-ocular motions intact bilaterally V,VII: smile symmetric, facial light touch sensation normal bilaterally VIII: hearing normal bilaterally IX,X: gag reflex present XI: bilateral shoulder shrug XII: midline tongue extension Motor: Right : Upper extremity   5/5    Left:     Upper extremity   5/5  Lower extremity   5/5     Lower extremity   5/5 Sensory: Pinprick and light touch intact throughout, bilaterally Deep Tendon Reflexes: 3+ and symmetric throughout Plantars: Right: upgoing   Left: mute Cerebellar: Dysmetric finger-to-nose and heel-to-shin testing particularly on the right Gait: Unable to test CV: pulses palpable throughout   Laboratory Studies:   Basic Metabolic Panel:  Recent Labs Lab 09/05/12 2000 09/05/12 2008  NA  --  141  K  --  2.4*  CL  --  97  CO2  --  32  GLUCOSE  --  119*  BUN  --  12  CREATININE  --  0.64  CALCIUM  --  10.4  MG 2.2  --     Liver Function Tests:  Recent Labs Lab 09/05/12 2008  AST 20  ALT 16  ALKPHOS 75  BILITOT 1.3*  PROT 8.0  ALBUMIN 4.7   No results found for this basename: LIPASE, AMYLASE,  in the last 168 hours No results found for this basename: AMMONIA,  in the last 168 hours  CBC:  Recent Labs Lab 09/05/12 2008  WBC 13.6*  NEUTROABS 12.4*  HGB 14.7  HCT 40.9  MCV 90.7  PLT 298    Cardiac Enzymes: No results found for this basename: CKTOTAL, CKMB, CKMBINDEX, TROPONINI,  in the last 168  hours  BNP: No components found with this basename: POCBNP,   CBG: No results found for this basename: GLUCAP,  in the last 168 hours  Microbiology: No results found for this or any previous visit.  Coagulation Studies: No results found for this basename: LABPROT, INR,  in the last 72 hours  Urinalysis: No results found for this basename: COLORURINE, APPERANCEUR, LABSPEC, PHURINE, GLUCOSEU, HGBUR, BILIRUBINUR, KETONESUR, PROTEINUR, UROBILINOGEN, NITRITE, LEUKOCYTESUR,  in the last 168 hours  Lipid Panel:     Component Value Date/Time   CHOL 142 05/28/2012 1000   TRIG 49 05/28/2012 1000   HDL 66 05/28/2012 1000   CHOLHDL 2.2 05/28/2012 1000   VLDL 10 05/28/2012 1000   LDLCALC 66 05/28/2012 1000    HgbA1C:  Lab Results  Component Value Date   HGBA1C 5.0 05/28/2012    Urine Drug Screen:     Component Value Date/Time   LABOPIA NONE DETECTED 05/28/2012 0114   COCAINSCRNUR NONE DETECTED 05/28/2012  0114   LABBENZ NONE DETECTED 05/28/2012 0114   AMPHETMU NONE DETECTED 05/28/2012 0114   THCU NONE DETECTED 05/28/2012 0114   LABBARB NONE DETECTED 05/28/2012 0114    Alcohol Level: No results found for this basename: ETH,  in the last 168 hours  Other results: EKG: sinus tachycardia at 109 bpm.  Imaging: Ct Head Wo Contrast  09/06/2012   *RADIOLOGY REPORT*  Clinical Data: 42 year old female with right upper lower extremity numbness.  Vertigo.  Multiple sclerosis.  CT HEAD WITHOUT CONTRAST  Technique:  Contiguous axial images were obtained from the base of the skull through the vertex without contrast.  Comparison: Brain MRI 05/28/2012 and earlier.  Findings: Visualized paranasal sinuses and mastoids are clear. Visualized orbits and scalp soft tissues are within normal limits. No acute osseous abnormality identified.  Mild Calcified atherosclerosis at the skull base.  Patchy and confluent cerebral white matter hypodensity has not significantly changed. No midline shift, mass effect, or evidence of mass  lesion. No ventriculomegaly. No acute intracranial hemorrhage identified. No evidence of cortically based acute infarction identified.  No suspicious intracranial vascular hyperdensity.  IMPRESSION: Stable advanced white matter disease.  No new intracranial abnormality identified.   Original Report Authenticated By: Erskine Speed, M.D.     Assessment/Plan: 42 year old female with a history of MS and TIA's presents today weak.  Exam likely at baseline.  Changes in function today may be related to her hypokalemia but do not suspect a MS exacerbation or TIA.  CT of head reviewed and shows no acute changes.  Recommendations: 1.  MRI of the brian with and without contrast 2.  PT 3.  Continue ASA 4.  Patient to follow up with a neurologist at discharge  Case discussed with Dr. Consuello Masse physician  Thana Farr, MD [facility] [phone] 09/06/2012, 2:25 AM

## 2012-09-07 DIAGNOSIS — E119 Type 2 diabetes mellitus without complications: Secondary | ICD-10-CM

## 2012-09-07 DIAGNOSIS — I10 Essential (primary) hypertension: Secondary | ICD-10-CM

## 2012-09-07 LAB — GLUCOSE, CAPILLARY: Glucose-Capillary: 145 mg/dL — ABNORMAL HIGH (ref 70–99)

## 2012-09-07 LAB — BASIC METABOLIC PANEL
Chloride: 103 mEq/L (ref 96–112)
GFR calc non Af Amer: >90 mL/min (ref >90)
Glucose, Bld: 172 mg/dL — ABNORMAL HIGH (ref 70–99)
Potassium: 3.7 mEq/L (ref 3.5–5.1)
Sodium: 136 mEq/L (ref 135–145)

## 2012-09-07 NOTE — Evaluation (Signed)
Physical Therapy Evaluation Patient Details Name: [name] [name] MRN: [id number] DOB: 1970-09-27 Today's Date: 09/07/2012 Time: 4098-1191 PT Time Calculation (min): 24 min  PT Assessment / Plan / Recommendation History of Present Illness  Patient is a 42 yo female admitted with RLE weakness and numbness.  MRI showed new areas of demyelination.  Patient with MS exacerbation, with ataxia.  Clinical Impression  Patient presents with problems listed below.  Will benefit from acute PT to maximize independence prior to return home with family.  Patient with increased ataxia and decreased balance with gait.  Instructed patient to have someone with her when she is walking with her RW for safety.  Recommend HHPT for f/u therapy at discharge.    PT Assessment  Patient needs continued PT services    Follow Up Recommendations  Home health PT;Supervision/Assistance - 24 hour    Does the patient have the potential to tolerate intense rehabilitation      Barriers to Discharge        Equipment Recommendations  None recommended by PT    Recommendations for Other Services     Frequency Min 3X/week    Precautions / Restrictions Precautions Precautions: Fall Restrictions Weight Bearing Restrictions: No   Pertinent Vitals/Pain       Mobility  Bed Mobility Bed Mobility: Supine to Sit;Sit to Supine;Sitting - Scoot to Edge of Bed Supine to Sit: 4: Min guard;With rails;HOB elevated Sitting - Scoot to Edge of Bed: 4: Min guard Sit to Supine: 4: Min guard;HOB elevated;With rail Details for Bed Mobility Assistance: No cues needed.  Assist for safety only. Transfers Transfers: Sit to Stand;Stand to [employer] Transfers Sit to Stand: 4: Min assist;With upper extremity assist;From bed;With armrests;From chair/3-in-1 Stand to Sit: 4: Min assist;With upper extremity assist;With armrests;To chair/3-in-1;To bed Stand Pivot Transfers: 4: Min assist Details for Transfer Assistance: Verbal cues  for hand placement.  Assist due to ataxia and decreased balance. Ambulation/Gait Ambulation/Gait Assistance: 4: Min assist Ambulation Distance (Feet): 24 Feet Assistive device: Rolling walker Ambulation/Gait Assistance Details: Verbal cues to stay close to RW.  Assist to maintain balance.  Note tremors in RLE during stance phase.  Bil LE's with ataxic movement during swing phase of gait.  Balance decreased, requiring assist for safety. Gait Pattern: Step-through pattern;Decreased stride length;Ataxic;Trunk flexed Gait velocity: Slow gait speed Modified Rankin (Stroke Patients Only) Pre-Morbid Rankin Score: Moderate disability Modified Rankin: Moderately severe disability    Exercises     PT Diagnosis: Difficulty walking;Abnormality of gait;Generalized weakness  PT Problem List: Decreased strength;Decreased activity tolerance;Decreased balance;Decreased mobility;Decreased coordination;Impaired tone PT Treatment Interventions: DME instruction;Gait training;Functional mobility training;Balance training;Patient/family education     PT Goals(Current goals can be found in the care plan section) Acute Rehab PT Goals Patient Stated Goal: To walk better. PT Goal Formulation: With patient Time For Goal Achievement: 09/14/12 Potential to Achieve Goals: Good  Visit Information  Last PT Received On: 09/07/12 Assistance Needed: +1 History of Present Illness: Patient is a 42 yo female admitted with RLE weakness and numbness.  MRI showed new areas of demyelination.  Patient with MS exacerbation, with ataxia.       Prior Functioning  Home Living Family/patient expects to be discharged to:: Private residence Living Arrangements: Children (2 sons and 1 daughter-in-law) Available Help at Discharge: Family;Available 24 hours/day Type of Home: Apartment Home Access: Level entry Home Layout: One level Home Equipment: Walker - 2 wheels;Cane - single point Prior Function Level of Independence:  Independent with assistive device(s);Needs assistance (  uses RW) Gait / Transfers Assistance Needed: Has supervision with ambulation with RW outside of home ADL's / Homemaking Assistance Needed: Assist with meals and housekeeping Communication Communication: Expressive difficulties (Difficulty saying some words)    Cognition  Cognition Arousal/Alertness: Awake/alert Behavior During Therapy: WFL for tasks assessed/performed Overall Cognitive Status: Within Functional Limits for tasks assessed    Extremity/Trunk Assessment Upper Extremity Assessment Upper Extremity Assessment: Generalized weakness Lower Extremity Assessment Lower Extremity Assessment: RLE deficits/detail;LLE deficits/detail RLE Deficits / Details: Strength 3+/5  RLE Coordination: decreased gross motor (Movements very ataxic.  Note clonus and tremors in gait) LLE Deficits / Details: Strength 4-/5 LLE Coordination: decreased gross motor (Ataxic movement - less than RLE)   Balance Balance Balance Assessed: Yes Static Standing Balance Static Standing - Balance Support: Bilateral upper extremity supported Static Standing - Level of Assistance: 4: Min assist Static Standing - Comment/# of Minutes: 3 minutes.  Patient with increased sway.  Unable to stand without UE support.  End of Session PT - End of Session Equipment Utilized During Treatment: Gait belt Activity Tolerance: Patient tolerated treatment well Patient left: in bed;with call bell/phone within reach Nurse Communication: Mobility status  GP     Vena Austria 09/07/2012, 12:28 PM Durenda Hurt. Renaldo Fiddler, [facility] Acute Rehab Services Pager [phone]

## 2012-09-07 NOTE — Progress Notes (Signed)
PATIENT DETAILS Name: [name] [name] Age: 42 y.o. Sex: female Date of Birth: 1970-05-16 Admit Date: 09/05/2012 Admitting Physician Eduard Clos, MD [id number], Keane Scrape, MD  Subjective: No complaints  Assessment/Plan: Principal Problem:   Right sided weakness -suspect she is back to baseline -2/2 multiple sclerosis flare confirmed ont MRI Brain -Started on IV Solumedrol 1 gm for 3 days-from 8/16 -Neuro following -c/w ASA  Active Problems: Hx of MS -needs referral to Neuro by PCP  Hypokalemia -resolved with repletion-will start daily supplementation -secondary to diarrhea and HCTZ -monitor  DIABETES MELLITUS, TYPE II -CBG's stable with SSI -resume Metformin on discharge  HYPERTENSION - continue with HCTZ and Metoprolol  Disposition: Remain inpatient  DVT Prophylaxis: Prophylactic Lovenox  Code Status: Full code   Family Communication None at bedside  Procedures:  None  CONSULTS:  neurology   MEDICATIONS: Scheduled Meds: . aspirin  81 mg Oral Daily  . enoxaparin (LOVENOX) injection  40 mg Subcutaneous Q24H  . hydrochlorothiazide  25 mg Oral Daily  . insulin aspart  0-9 Units Subcutaneous TID WC  . methylPREDNISolone (SOLU-MEDROL) injection  1,000 mg Intravenous Daily  . metoprolol tartrate  25 mg Oral BID  . potassium chloride  20 mEq Oral Daily   Continuous Infusions:   PRN Meds:.meclizine  Antibiotics: Anti-infectives   None       PHYSICAL EXAM: Vital signs in last 24 hours: Filed Vitals:   09/07/12 0014 09/07/12 0500 09/07/12 0900 09/07/12 1420  BP: 135/86 133/87 140/96 142/93  Pulse: 85 75 70 75  Temp: 98.3 F (36.8 C) 97.5 F (36.4 C) 97.4 F (36.3 C) 98.2 F (36.8 C)  TempSrc: Oral  Oral Oral  Resp: 18 16 18 20   Height:      Weight:      SpO2: 100% 100% 100% 100%    Weight change:  Filed Weights   09/06/12 0444  Weight: 52.164 kg (115 lb)   Body mass index is 21.74 kg/(m^2).   Gen Exam: Awake and  alert with clear speech.   Neck: Supple, No JVD.   Chest: B/L Clear.   CVS: S1 S2 Regular, no murmurs.  Abdomen: soft, BS +, non tender, non distended.  Extremities: no edema, lower extremities warm to touch. Neurologic: Non Focal.   Skin: No Rash.  Wounds: N/A.    Intake/Output from previous day:  Intake/Output Summary (Last 24 hours) at 09/07/12 1438 Last data filed at 09/07/12 1420  Gross per 24 hour  Intake    960 ml  Output    875 ml  Net     85 ml     LAB RESULTS: CBC  Recent Labs Lab 09/05/12 2008 09/06/12 0925  WBC 13.6* 10.5  HGB 14.7 12.5  HCT 40.9 34.5*  PLT 298 239  MCV 90.7 89.4  MCH 32.6 32.4  MCHC 35.9 36.2*  RDW 12.2 12.1  LYMPHSABS 0.7 2.4  MONOABS 0.5 1.0  EOSABS 0.0 0.1  BASOSABS 0.0 0.0    Chemistries   Recent Labs Lab 09/05/12 2000 09/05/12 2008 09/06/12 0925 09/07/12 0538  NA  --  141 139 136  K  --  2.4* 3.5 3.7  CL  --  97 106 103  CO2  --  32 25 23  GLUCOSE  --  119* 100* 172*  BUN  --  12 8 10   CREATININE  --  0.64 0.50 0.53  CALCIUM  --  10.4 9.1 9.6  MG 2.2  --  2.0  --  CBG:  Recent Labs Lab 09/06/12 1705 09/06/12 2105 09/06/12 2333 09/07/12 0752 09/07/12 1203  GLUCAP 81 147* 138* 145* 174*    GFR Estimated Creatinine Clearance: 69.1 ml/min (by C-G formula based on Cr of 0.53).  Coagulation profile No results found for this basename: INR, PROTIME,  in the last 168 hours  Cardiac Enzymes No results found for this basename: CK, CKMB, TROPONINI, MYOGLOBIN,  in the last 168 hours  No components found with this basename: POCBNP,  No results found for this basename: DDIMER,  in the last 72 hours No results found for this basename: HGBA1C,  in the last 72 hours No results found for this basename: CHOL, HDL, LDLCALC, TRIG, CHOLHDL, LDLDIRECT,  in the last 72 hours  Recent Labs  09/06/12 0925  TSH 0.984   No results found for this basename: VITAMINB12, FOLATE, FERRITIN, TIBC, IRON, RETICCTPCT,  in the  last 72 hours No results found for this basename: LIPASE, AMYLASE,  in the last 72 hours  Urine Studies No results found for this basename: UACOL, UAPR, USPG, UPH, UTP, UGL, UKET, UBIL, UHGB, UNIT, UROB, ULEU, UEPI, UWBC, URBC, UBAC, CAST, CRYS, UCOM, BILUA,  in the last 72 hours  MICROBIOLOGY: No results found for this or any previous visit (from the past 240 hour(s)).  RADIOLOGY STUDIES/RESULTS: Ct Head Wo Contrast  09/06/2012   *RADIOLOGY REPORT*  Clinical Data: 42 year old female with right upper lower extremity numbness.  Vertigo.  Multiple sclerosis.  CT HEAD WITHOUT CONTRAST  Technique:  Contiguous axial images were obtained from the base of the skull through the vertex without contrast.  Comparison: Brain MRI 05/28/2012 and earlier.  Findings: Visualized paranasal sinuses and mastoids are clear. Visualized orbits and scalp soft tissues are within normal limits. No acute osseous abnormality identified.  Mild Calcified atherosclerosis at the skull base.  Patchy and confluent cerebral white matter hypodensity has not significantly changed. No midline shift, mass effect, or evidence of mass lesion. No ventriculomegaly. No acute intracranial hemorrhage identified. No evidence of cortically based acute infarction identified.  No suspicious intracranial vascular hyperdensity.  IMPRESSION: Stable advanced white matter disease.  No new intracranial abnormality identified.   Original Report Authenticated By: Erskine Speed, M.D.    Jeoffrey Massed, MD  [facility] Pager:[phone]  If 7PM-7AM, please contact night-coverage [email or web address] Password [email or web address] 09/07/2012, 2:38 PM   LOS: 2 days

## 2012-09-07 NOTE — Progress Notes (Signed)
NEURO HOSPITALIST PROGRESS NOTE   SUBJECTIVE:                                                                                                                        Feels better today, said that right leg is stronger. MRI brain revealed new areas of active demyelinating plaques.  IV Solumedrol 2/3 days. No other neurological complains.  OBJECTIVE:                                                                                                                           Vital signs in last 24 hours: Temp:  [97.4 F (36.3 C)-98.5 F (36.9 C)] 97.4 F (36.3 C) (08/17 0900) Pulse Rate:  [70-90] 70 (08/17 0900) Resp:  [16-20] 18 (08/17 0900) BP: (126-164)/(83-96) 140/96 mmHg (08/17 0900) SpO2:  [100 %] 100 % (08/17 0900)  Intake/Output from previous day: 08/16 0701 - 08/17 0700 In: 600 [P.O.:600] Out: 425 [Urine:425] Intake/Output this shift: Total I/O In: 240 [P.O.:240] Out: 250 [Urine:250] Nutritional status: Cardiac  Past Medical History  Diagnosis Date  . Multiple sclerosis   . HYPERTENSION 09/30/2006  . DIABETES MELLITUS, TYPE II 09/30/2006  . TIA (transient ischemic attack)   . TOBACCO USER 09/30/2006    Qualifier: Diagnosis of  By: Delrae Alfred MD, Lanora Manis    . Multiple sclerosis, relapsing-remitting 05/29/2012  . DYSLIPIDEMIA 09/30/2006    Qualifier: Diagnosis of  By: Delrae Alfred MD, Lanora Manis    . DIABETES MELLITUS, TYPE II 09/30/2006    Qualifier: Diagnosis of  By: Delrae Alfred MD, Lanora Manis    . BENIGN POSITIONAL VERTIGO 01/27/2010    Qualifier: Diagnosis of  By: Delrae Alfred MD, Lanora Manis    . Stroke     Neurologic Exam:  Mental Status:  Alert, oriented x 4. Speech fluent without evidence of aphasia. Able to follow 3 step commands without difficulty.  Cranial Nerves:  II: Discs flat bilaterally; Visual fields grossly normal, pupils equal, round, reactive to light and accommodation  III,IV, VI: ptosis not present, extra-ocular motions intact  bilaterally  V,VII: smile symmetric, facial light touch sensation normal bilaterally  VIII: hearing normal bilaterally  IX,X: gag reflex present  XI: bilateral shoulder shrug  XII: midline tongue extension  Motor:  5/5 all over  Sensory: Pinprick and light touch intact throughout, bilaterally  Deep Tendon Reflexes: 3+ and symmetric throughout  Plantars:  Right: upgoing Left: mute  Cerebellar:  Dysmetric finger-to-nose and heel-to-shin testing particularly on the right  Gait: Unable to test  CV: pulses palpable throughout    Lab Results: Lab Results  Component Value Date/Time   CHOL 142 05/28/2012 10:00 AM   Lipid Panel No results found for this basename: CHOL, TRIG, HDL, CHOLHDL, VLDL, LDLCALC,  in the last 72 hours  Studies/Results: Ct Head Wo Contrast  09/06/2012   *RADIOLOGY REPORT*  Clinical Data: 42 year old female with right upper lower extremity numbness.  Vertigo.  Multiple sclerosis.  CT HEAD WITHOUT CONTRAST  Technique:  Contiguous axial images were obtained from the base of the skull through the vertex without contrast.  Comparison: Brain MRI 05/28/2012 and earlier.  Findings: Visualized paranasal sinuses and mastoids are clear. Visualized orbits and scalp soft tissues are within normal limits. No acute osseous abnormality identified.  Mild Calcified atherosclerosis at the skull base.  Patchy and confluent cerebral white matter hypodensity has not significantly changed. No midline shift, mass effect, or evidence of mass lesion. No ventriculomegaly. No acute intracranial hemorrhage identified. No evidence of cortically based acute infarction identified.  No suspicious intracranial vascular hyperdensity.  IMPRESSION: Stable advanced white matter disease.  No new intracranial abnormality identified.   Original Report Authenticated By: Erskine Speed, M.D.   Mr Laqueta Jean RU Contrast  09/06/2012   *RADIOLOGY REPORT*  Clinical Data: History multiple sclerosis.  New onset vertigo. Nausea.   MRI HEAD WITHOUT AND WITH CONTRAST  Technique:  Multiplanar, multiecho pulse sequences of the brain and surrounding structures were obtained according to standard protocol without and with intravenous contrast  Contrast: 10mL MULTIHANCE GADOBENATE DIMEGLUMINE 529 MG/ML IV SOLN  Comparison: Head CT same day.  MRI 05/28/2012 and previous  Findings: There has been considerable progression of active disease since the previous study.  There is a newly seen, active plaque in the right lateral cerebellum measuring 5 mm in size showing contrast enhancement.  There are numerous foci of of active demyelination within the cerebral hemispheric deep and subcortical white matter with new T2 lesions and contrast enhancement.  The largest is adjacent the posterior body of the right lateral ventricle measuring 1.3 cm in maximal dimension.  Chronic white matter disease elsewhere throughout the brain remains the same. There is no evidence of hemorrhage.  No hydrocephalus or extra- axial collection.  The ventricles are becoming increasingly prominent over time because of central atrophy.  No pituitary mass. No inflammatory sinus disease.  No skull or skull base lesion.  IMPRESSION:  Multiple newly seen active foci of demyelination throughout the cerebellum and cerebral hemispheric white matter as outlined above.   Original Report Authenticated By: Paulina Fusi, M.D.    MEDICATIONS  I have reviewed the patient's current medications.  ASSESSMENT/PLAN:                                                                                                           Probable MS relapse with new areas of active plaque demyelination on brain MRI. Continue solumedrol. Will follow up.  [name], MD [facility] [phone]  09/07/2012, 11:05 AM

## 2012-09-08 ENCOUNTER — Ambulatory Visit: Payer: Medicaid Other

## 2012-09-08 LAB — GLUCOSE, CAPILLARY: Glucose-Capillary: 120 mg/dL — ABNORMAL HIGH (ref 70–99)

## 2012-09-08 MED ORDER — METOPROLOL TARTRATE 25 MG PO TABS: 25.0000 mg | ORAL_TABLET | Freq: Two times a day (BID) | ORAL | Status: DC

## 2012-09-08 MED ORDER — PREDNISONE 20 MG PO TABS
40.0000 mg | ORAL_TABLET | Freq: Every day | ORAL | Status: DC
  Filled 2012-09-08 (×2): qty 2

## 2012-09-08 MED ORDER — PREDNISONE 10 MG PO TABS: ORAL_TABLET | ORAL | Status: DC

## 2012-09-08 MED ORDER — POTASSIUM CHLORIDE CRYS ER 20 MEQ PO TBCR: 20.0000 meq | EXTENDED_RELEASE_TABLET | Freq: Every day | ORAL | Status: DC

## 2012-09-08 MED ORDER — PREDNISONE 20 MG PO TABS
40.0000 mg | ORAL_TABLET | Freq: Every day | ORAL | Status: DC
  Filled 2012-09-08: qty 2

## 2012-09-08 NOTE — Care Management Note (Signed)
    Page 1 of 1   09/08/2012     12:42:00 PM   CARE MANAGEMENT NOTE 09/08/2012  Patient:  [name] [name], [name] [name]   Account Number:  [id number]  Date Initiated:  09/08/2012  Documentation initiated by:  GRAVES-BIGELOW,Zoraya Fiorenza  Subjective/Objective Assessment:   Pt admitted for with RLE weakness and numbness.  MRI showed new areas of demyelination.  Patient with MS exacerbation, with ataxia.     Action/Plan:   CM did call to see if pt qualifies for [facility] services for PT since pt is medicaid. CM will f/u for disposition needs. Pt has DME.   Anticipated DC Date:  09/08/2012   Anticipated DC Plan:  HOME W HOME HEALTH SERVICES      DC Planning Services  CM consult      Choice offered to / List presented to:  C-1 Patient        HH arranged  HH-2 PT      [facility] agency  [facility]   Status of service:  Completed, signed off Medicare Important Message given?   (If response is "NO", the following Medicare IM given date fields will be blank) Date Medicare IM given:   Date Additional Medicare IM given:    Discharge Disposition:  HOME W HOME HEALTH SERVICES  Per UR Regulation:  Reviewed for med. necessity/level of care/duration of stay  If discussed at Long Length of Stay Meetings, dates discussed:    Comments:  09-08-12 1239 Tomi Bamberger, RN,BSN 1240 CM did make referral for [facility] services with [facility]. Son Mena Goes is aware of services and SOC ot begin within 24-48 hours post d/c.

## 2012-09-08 NOTE — Progress Notes (Signed)
Discharge Note: Pt is alert and oriented, VS are stable, denies CP.  Telemetry & IV discontinued.  Discharge instructions reviewed with patient, pt verbalizes understanding.  Wheelchair transportation provided, all belongings with patient  

## 2012-09-08 NOTE — Discharge Summary (Signed)
PATIENT DETAILS Name: [name] [name] Age: 42 y.o. Sex: female Date of Birth: 1970-03-05 MRN: [id number]. Admit Date: 09/05/2012 Admitting Physician: Eduard Clos, MD [id number], Keane Scrape, MD  Recommendations for Outpatient Follow-up:  1. Needs followup with neurology-for starting disease modifying agents for her multiple sclerosis-followup appointment has been made-see below 2. Optimize diabetic and hypertensive regimen, please check electrolytes periodically as the patient was hypokalemic on presentation.  PRIMARY DISCHARGE DIAGNOSIS:  Principal Problem:   Right leg weakness secondary to multiple sclerosis flare Active Problems:   DIABETES MELLITUS, TYPE II   HYPERTENSION   Hypokalemia      PAST MEDICAL HISTORY: Past Medical History  Diagnosis Date  . Multiple sclerosis   . HYPERTENSION 09/30/2006  . DIABETES MELLITUS, TYPE II 09/30/2006  . TIA (transient ischemic attack)   . TOBACCO USER 09/30/2006    Qualifier: Diagnosis of  By: Delrae Alfred MD, Lanora Manis    . Multiple sclerosis, relapsing-remitting 05/29/2012  . DYSLIPIDEMIA 09/30/2006    Qualifier: Diagnosis of  By: Delrae Alfred MD, Lanora Manis    . DIABETES MELLITUS, TYPE II 09/30/2006    Qualifier: Diagnosis of  By: Delrae Alfred MD, Lanora Manis    . BENIGN POSITIONAL VERTIGO 01/27/2010    Qualifier: Diagnosis of  By: Delrae Alfred MD, Lanora Manis    . Stroke     DISCHARGE MEDICATIONS:   Medication List    STOP taking these medications       POTASSIUM CHLORIDE PO      TAKE these medications       aspirin 81 MG tablet  Take 81 mg by mouth daily.     hydrochlorothiazide 25 MG tablet  Commonly known as:  HYDRODIURIL  Take 25 mg by mouth daily.     meclizine 25 MG tablet  Commonly known as:  ANTIVERT  Take 25 mg by mouth 3 (three) times daily as needed for dizziness or nausea.     metFORMIN 500 MG tablet  Commonly known as:  GLUCOPHAGE  Take 500 mg by mouth daily with breakfast.     metoprolol tartrate 25 MG tablet   Commonly known as:  LOPRESSOR  Take 25 mg by mouth 2 (two) times daily.     potassium chloride SA 20 MEQ tablet  Commonly known as:  K-DUR,KLOR-CON  Take 1 tablet (20 mEq total) by mouth daily.     predniSONE 10 MG tablet  Commonly known as:  DELTASONE  - Take 40 mg (4 tablets) daily for 2 days,then,  - Take 30 mg (3 tablets) daily for 2 days, then,  - Take 20 mg (2 tablets) daily for 2 days, then,  - Take 10 mg (1 tablet) daily for 2 days, then,  - Take 5 mg (0.5 tablet) daily for 2 days, and then stop        ALLERGIES:  No Known Allergies  BRIEF HPI:  See H&P, Labs, Consult and Test reports for all details in brief, patient is a 42 year old female with a known history of multiple sclerosis, diabetes and hypertension came into the hospital complaining of right lower extremity weakness.  CONSULTATIONS:   neurology  PERTINENT RADIOLOGIC STUDIES: Ct Head Wo Contrast  09/06/2012   *RADIOLOGY REPORT*  Clinical Data: 42 year old female with right upper lower extremity numbness.  Vertigo.  Multiple sclerosis.  CT HEAD WITHOUT CONTRAST  Technique:  Contiguous axial images were obtained from the base of the skull through the vertex without contrast.  Comparison: Brain MRI 05/28/2012 and earlier.  Findings: Visualized paranasal sinuses and mastoids are  clear. Visualized orbits and scalp soft tissues are within normal limits. No acute osseous abnormality identified.  Mild Calcified atherosclerosis at the skull base.  Patchy and confluent cerebral white matter hypodensity has not significantly changed. No midline shift, mass effect, or evidence of mass lesion. No ventriculomegaly. No acute intracranial hemorrhage identified. No evidence of cortically based acute infarction identified.  No suspicious intracranial vascular hyperdensity.  IMPRESSION: Stable advanced white matter disease.  No new intracranial abnormality identified.   Original Report Authenticated By: Erskine Speed, M.D.   Mr  Laqueta Jean ZO Contrast  09/06/2012   *RADIOLOGY REPORT*  Clinical Data: History multiple sclerosis.  New onset vertigo. Nausea.  MRI HEAD WITHOUT AND WITH CONTRAST  Technique:  Multiplanar, multiecho pulse sequences of the brain and surrounding structures were obtained according to standard protocol without and with intravenous contrast  Contrast: 10mL MULTIHANCE GADOBENATE DIMEGLUMINE 529 MG/ML IV SOLN  Comparison: Head CT same day.  MRI 05/28/2012 and previous  Findings: There has been considerable progression of active disease since the previous study.  There is a newly seen, active plaque in the right lateral cerebellum measuring 5 mm in size showing contrast enhancement.  There are numerous foci of of active demyelination within the cerebral hemispheric deep and subcortical white matter with new T2 lesions and contrast enhancement.  The largest is adjacent the posterior body of the right lateral ventricle measuring 1.3 cm in maximal dimension.  Chronic white matter disease elsewhere throughout the brain remains the same. There is no evidence of hemorrhage.  No hydrocephalus or extra- axial collection.  The ventricles are becoming increasingly prominent over time because of central atrophy.  No pituitary mass. No inflammatory sinus disease.  No skull or skull base lesion.  IMPRESSION:  Multiple newly seen active foci of demyelination throughout the cerebellum and cerebral hemispheric white matter as outlined above.   Original Report Authenticated By: Paulina Fusi, M.D.     PERTINENT LAB RESULTS: CBC:  Recent Labs  09/05/12 2008 09/06/12 0925  WBC 13.6* 10.5  HGB 14.7 12.5  HCT 40.9 34.5*  PLT 298 239   CMET CMP     Component Value Date/Time   NA 136 09/07/2012 0538   K 3.7 09/07/2012 0538   CL 103 09/07/2012 0538   CO2 23 09/07/2012 0538   GLUCOSE 172* 09/07/2012 0538   BUN 10 09/07/2012 0538   CREATININE 0.53 09/07/2012 0538   CREATININE 0.58 07/21/2012 1404   CALCIUM 9.6 09/07/2012 0538   PROT  6.2 09/06/2012 0925   ALBUMIN 3.5 09/06/2012 0925   AST 16 09/06/2012 0925   ALT 11 09/06/2012 0925   ALKPHOS 58 09/06/2012 0925   BILITOT 1.4* 09/06/2012 0925   GFRNONAA >90 09/07/2012 0538   GFRAA >90 09/07/2012 0538    GFR Estimated Creatinine Clearance: 69.1 ml/min (by C-G formula based on Cr of 0.53). No results found for this basename: LIPASE, AMYLASE,  in the last 72 hours No results found for this basename: CKTOTAL, CKMB, CKMBINDEX, TROPONINI,  in the last 72 hours No components found with this basename: POCBNP,  No results found for this basename: DDIMER,  in the last 72 hours No results found for this basename: HGBA1C,  in the last 72 hours No results found for this basename: CHOL, HDL, LDLCALC, TRIG, CHOLHDL, LDLDIRECT,  in the last 72 hours  Recent Labs  09/06/12 0925  TSH 0.984   No results found for this basename: VITAMINB12, FOLATE, FERRITIN, TIBC, IRON, RETICCTPCT,  in the  last 72 hours Coags: No results found for this basename: PT, INR,  in the last 72 hours Microbiology: No results found for this or any previous visit (from the past 240 hour(s)).   BRIEF HOSPITAL COURSE:   Principal Problem: Right sided weakness  -2/2 multiple sclerosis flare confirmed ont MRI Brain  - Once MRI confirmed MS flare, she was Started on IV Solumedrol 1 gm for 3 days-from 8/16 , this has not been completed, she has improved significantly, suspect she is now back to her usual baseline. Neurology has recommended tapering dose of prednisone. I have made her a followup appointment with [facility] neurology as indicated below, I have explained to the patient multiple times that she needs to be compliant with this appointment, so she can be started on perhaps disease modifying therapy. I also explained to her in great detail about progression of her underlying MS with significant functional impairment if she does not keep this appointment and get started on disease modifying medications. -Neuro was  consulted during this hospital stay. -c/w ASA  Hx of MS  -needs referral to Neuro- appointment has been arranged. See above  Hypokalemia  -resolved with repletion-will start daily supplementation  -secondary to diarrhea and HCTZ  -monitor electrolytes periodically  DIABETES MELLITUS, TYPE II  -CBG's stable with SSI while she was inpatient -resume Metformin on discharge   HYPERTENSION  - continue with HCTZ and Metoprolol  TODAY-DAY OF DISCHARGE:  Subjective:   Rinoa Awadallah today has no headache,no chest abdominal pain,no new weakness tingling or numbness, feels much better wants to go home today.   Objective:   Blood pressure 151/100, pulse 75, temperature 98.5 F (36.9 C), temperature source Oral, resp. rate 18, height 5\' 1"  (1.549 m), weight 52.164 kg (115 lb), last menstrual period 08/23/2012, SpO2 100.00%.  Intake/Output Summary (Last 24 hours) at 09/08/12 1159 Last data filed at 09/08/12 0838  Gross per 24 hour  Intake   1200 ml  Output   1850 ml  Net   -650 ml   Filed Weights   09/06/12 0444  Weight: 52.164 kg (115 lb)    Exam Awake Alert, Oriented *3, No new F.N deficits, Normal affect [place].AT,PERRAL Supple Neck,No JVD, No cervical lymphadenopathy appriciated.  Symmetrical Chest wall movement, Good air movement bilaterally, CTAB RRR,No Gallops,Rubs or new Murmurs, No Parasternal Heave +ve B.Sounds, Abd Soft, Non tender, No organomegaly appriciated, No rebound -guarding or rigidity. No Cyanosis, Clubbing or edema, No new Rash or bruise  DISCHARGE CONDITION: Stable  DISPOSITION: Home with home health services  DISCHARGE INSTRUCTIONS:    Activity:  As tolerated with Full fall precautions use walker/cane & assistance as needed  Diet recommendation: Diabetic Diet Heart Healthy diet      Discharge Orders   Future Appointments Provider Department Dept Phone   09/17/2012 12:15 PM [facility] Covering Provider [facility]  [phone]   09/26/2012 8:30 AM York Spaniel, MD [facility] [phone]   Future Orders Complete By Expires   Call MD for:  extreme fatigue  As directed    Call MD for:  persistant dizziness or light-headedness  As directed    Diet - low sodium heart healthy  As directed    Diet Carb Modified  As directed    Increase activity slowly  As directed       Follow-up Information   Follow up with Lesly Dukes, MD On 09/26/2012. (APPOINTMENT AT 8 AM)    Specialty:  Neurology  Contact information:   [address] [place] [address] [phone]       Follow up with [facility] On 09/17/2012. (@ 1215 please bring d/c paperwork with medication list. )    Contact information:   [address] [place] [address] [phone]     Total Time spent on discharge equals 45 minutes.  SignedJeoffrey Massed 09/08/2012 11:59 AM

## 2012-09-08 NOTE — Care Management (Signed)
Follow up appointment set up for 09-17-12 at 1215. Pt is aware and has been placed on d/c summary. CM did call [facility] to see if they can f/u outpatient for pt. No further needs from CM at this time. Gala Lewandowsky, RN,BSN [phone]

## 2012-09-08 NOTE — Progress Notes (Signed)
NEURO HOSPITALIST PROGRESS NOTE   SUBJECTIVE:                                                                                                                        Stated that overall she is feeling better and the right leg is stronger. Able to walk. IV solumedrol 3/3 days to be completed today. No new neurological complains.  OBJECTIVE:                                                                                                                           Vital signs in last 24 hours: Temp:  [97.4 F (36.3 C)-98.4 F (36.9 C)] 98.4 F (36.9 C) (08/18 0736) Pulse Rate:  [70-83] 73 (08/18 0736) Resp:  [18-20] 18 (08/18 0736) BP: (121-164)/(84-96) 121/84 mmHg (08/18 0736) SpO2:  [100 %] 100 % (08/18 0736)  Intake/Output from previous day: 08/17 0701 - 08/18 0700 In: 1200 [P.O.:1200] Out: 2100 [Urine:2100] Intake/Output this shift: Total I/O In: 240 [P.O.:240] Out: -  Nutritional status: Cardiac  Past Medical History  Diagnosis Date  . Multiple sclerosis   . HYPERTENSION 09/30/2006  . DIABETES MELLITUS, TYPE II 09/30/2006  . TIA (transient ischemic attack)   . TOBACCO USER 09/30/2006    Qualifier: Diagnosis of  By: Delrae Alfred MD, Lanora Manis    . Multiple sclerosis, relapsing-remitting 05/29/2012  . DYSLIPIDEMIA 09/30/2006    Qualifier: Diagnosis of  By: Delrae Alfred MD, Lanora Manis    . DIABETES MELLITUS, TYPE II 09/30/2006    Qualifier: Diagnosis of  By: Delrae Alfred MD, Lanora Manis    . BENIGN POSITIONAL VERTIGO 01/27/2010    Qualifier: Diagnosis of  By: Delrae Alfred MD, Lanora Manis    . Stroke     Neurologic Exam:  Mental Status:  Alert, oriented x 4. Speech fluent without evidence of aphasia. Able to follow 3 step commands without difficulty.  Cranial Nerves:  II: Discs flat bilaterally; Visual fields grossly normal, pupils equal, round, reactive to light and accommodation  III,IV, VI: ptosis not present, extra-ocular motions intact bilaterally  V,VII:  smile symmetric, facial light touch sensation normal bilaterally  VIII: hearing normal bilaterally  IX,X: gag reflex present  XI: bilateral shoulder shrug  XII: midline tongue extension  Motor:  5/5 all  over, although some give away weakness right leg. Sensory: Pinprick and light touch intact throughout, bilaterally  Deep Tendon Reflexes: 3+ and symmetric throughout  Plantars:  Right: upgoing Left: mute  Cerebellar:  Dysmetric finger-to-nose and heel-to-shin testing particularly on the right  Gait: Unable to test  CV: pulses palpable throughout    Lab Results: Lab Results  Component Value Date/Time   CHOL 142 05/28/2012 10:00 AM   Lipid Panel No results found for this basename: CHOL, TRIG, HDL, CHOLHDL, VLDL, LDLCALC,  in the last 72 hours  Studies/Results: Mr [name] [name] Wo Contrast  09/06/2012   *RADIOLOGY REPORT*  Clinical Data: History multiple sclerosis.  New onset vertigo. Nausea.  MRI HEAD WITHOUT AND WITH CONTRAST  Technique:  Multiplanar, multiecho pulse sequences of the brain and surrounding structures were obtained according to standard protocol without and with intravenous contrast  Contrast: 10mL MULTIHANCE GADOBENATE DIMEGLUMINE 529 MG/ML IV SOLN  Comparison: Head CT same day.  MRI 05/28/2012 and previous  Findings: There has been considerable progression of active disease since the previous study.  There is a newly seen, active plaque in the right lateral cerebellum measuring 5 mm in size showing contrast enhancement.  There are numerous foci of of active demyelination within the cerebral hemispheric deep and subcortical white matter with new T2 lesions and contrast enhancement.  The largest is adjacent the posterior body of the right lateral ventricle measuring 1.3 cm in maximal dimension.  Chronic white matter disease elsewhere throughout the brain remains the same. There is no evidence of hemorrhage.  No hydrocephalus or extra- axial collection.  The ventricles are becoming  increasingly prominent over time because of central atrophy.  No pituitary mass. No inflammatory sinus disease.  No skull or skull base lesion.  IMPRESSION:  Multiple newly seen active foci of demyelination throughout the cerebellum and cerebral hemispheric white matter as outlined above.   Original Report Authenticated By: [name] [name], M.D.    MEDICATIONS                                                                                                                       I have reviewed the patient's current medications.  ASSESSMENT/PLAN:                                                                                                            MS relapse with active demyelinating plaques on MRI. Plan to complete 3/3 days IV solumedrol and D/C home on prednisone taper ( 40 mg x 2 days, 30 mg x  2 days, 20 mg x 2 days, 10 mg x 2 days, 5 mg x 2 days and D/C prednisone). She is not on disease modifying therapy and I strongly advised her to have outpatient neurology follow up to address this vital issue, as untreated MS will absolutely result on very rapid progression and significant functional impairment.   [name], MD [facility] [phone]  09/08/2012, 8:44 AM

## 2012-09-17 ENCOUNTER — Encounter: Payer: Self-pay | Admitting: Internal Medicine

## 2012-09-17 ENCOUNTER — Ambulatory Visit: Payer: Medicaid Other | Attending: Internal Medicine | Admitting: Internal Medicine

## 2012-09-17 VITALS — BP 148/100 | HR 86 | Temp 98.2°F | Resp 16 | Ht 61.0 in | Wt 111.0 lb

## 2012-09-17 DIAGNOSIS — E119 Type 2 diabetes mellitus without complications: Secondary | ICD-10-CM

## 2012-09-17 DIAGNOSIS — F329 Major depressive disorder, single episode, unspecified: Secondary | ICD-10-CM | POA: Insufficient documentation

## 2012-09-17 DIAGNOSIS — F3289 Other specified depressive episodes: Secondary | ICD-10-CM | POA: Insufficient documentation

## 2012-09-17 DIAGNOSIS — I10 Essential (primary) hypertension: Secondary | ICD-10-CM | POA: Insufficient documentation

## 2012-09-17 DIAGNOSIS — G35 Multiple sclerosis: Secondary | ICD-10-CM | POA: Insufficient documentation

## 2012-09-17 LAB — GLUCOSE, POCT (MANUAL RESULT ENTRY): POC Glucose: 50 mg/dl — AB (ref 70–99)

## 2012-09-17 MED ORDER — METOPROLOL TARTRATE 25 MG PO TABS: 25.0000 mg | ORAL_TABLET | Freq: Two times a day (BID) | ORAL | Status: DC

## 2012-09-17 MED ORDER — FREESTYLE SYSTEM KIT: 1.0000 | PACK | Status: DC | PRN

## 2012-09-17 MED ORDER — METFORMIN HCL 500 MG PO TABS: 500.0000 mg | ORAL_TABLET | Freq: Every day | ORAL | Status: DC

## 2012-09-17 MED ORDER — GLUCOSE BLOOD VI STRP: ORAL_STRIP | Status: DC

## 2012-09-17 MED ORDER — HYDROCHLOROTHIAZIDE 25 MG PO TABS: 25.0000 mg | ORAL_TABLET | Freq: Every day | ORAL | Status: DC

## 2012-09-17 MED ORDER — ASPIRIN 81 MG PO TABS: 81.0000 mg | ORAL_TABLET | Freq: Every day | ORAL | Status: DC

## 2012-09-17 MED ORDER — POTASSIUM CHLORIDE CRYS ER 20 MEQ PO TBCR: 20.0000 meq | EXTENDED_RELEASE_TABLET | Freq: Every day | ORAL | Status: DC

## 2012-09-17 MED ORDER — MECLIZINE HCL 25 MG PO TABS: 25.0000 mg | ORAL_TABLET | Freq: Three times a day (TID) | ORAL | Status: DC | PRN

## 2012-09-17 NOTE — Progress Notes (Signed)
Pt here for f/u Diabetes,HTN management. Need A1C,CBG- REQUESTING OUTPT. PHYSICAL THERAPY INSTEAD OF HOME VISITS

## 2012-09-17 NOTE — Progress Notes (Signed)
Patient ID: [name] [name], female   DOB: 22-Nov-1970, 42 y.o.   MRN: [id number]  CC:  HPI: 42 year old female with a history of multiple sclerosis recently hospitalized on 8/15 for right leg weakness secondary to MS flare confirmed by brain MRI, we'll present to the clinic for a followup. She is accompanied by her son who states that the weakness is improving. The patient ambulates with the help of a walker and occasionally uses a wheelchair. Patient is tearful and depressed and the son wants me to start her on an antidepressant. She has an appointment with neurology on 9/54 been started on disease modifying therapy for multiple sclerosis.   No Known Allergies Past Medical History  Diagnosis Date  . Multiple sclerosis   . HYPERTENSION 09/30/2006  . DIABETES MELLITUS, TYPE II 09/30/2006  . TIA (transient ischemic attack)   . TOBACCO USER 09/30/2006    Qualifier: Diagnosis of  By: Delrae Alfred MD, Lanora Manis    . Multiple sclerosis, relapsing-remitting 05/29/2012  . DYSLIPIDEMIA 09/30/2006    Qualifier: Diagnosis of  By: Delrae Alfred MD, Lanora Manis    . DIABETES MELLITUS, TYPE II 09/30/2006    Qualifier: Diagnosis of  By: Delrae Alfred MD, Lanora Manis    . BENIGN POSITIONAL VERTIGO 01/27/2010    Qualifier: Diagnosis of  By: Delrae Alfred MD, Lanora Manis    . Stroke    Current Outpatient Prescriptions on File Prior to Visit  Medication Sig Dispense Refill  . predniSONE (DELTASONE) 10 MG tablet Take 40 mg (4 tablets) daily for 2 days,then, Take 30 mg (3 tablets) daily for 2 days, then, Take 20 mg (2 tablets) daily for 2 days, then, Take 10 mg (1 tablet) daily for 2 days, then, Take 5 mg (0.5 tablet) daily for 2 days, and then stop  21 tablet  0   No current facility-administered medications on file prior to visit.   Family History  Problem Relation Age of Onset  . Hypertension Mother   . Kidney failure Mother   . Hypertension Father   . Gout Father   . Hypertension Sister   . Diabetes Sister   . Hypertension  Brother    History   Social History  . Marital Status: Single    Spouse Name: N/A    Number of Children: N/A  . Years of Education: N/A   Occupational History  . Not on file.   Social History Main Topics  . Smoking status: Former Smoker -- 0.25 packs/day  . Smokeless tobacco: Not on file  . Alcohol Use: No  . Drug Use: Not on file  . Sexual Activity: Not on file   Other Topics Concern  . Not on file   Social History Narrative  . No narrative on file    Review of Systems  Constitutional: Negative for fever, chills, diaphoresis, activity change, appetite change and fatigue.  HENT: Negative for ear pain, nosebleeds, congestion, facial swelling, rhinorrhea, neck pain, neck stiffness and ear discharge.   Eyes: Negative for pain, discharge, redness, itching and visual disturbance.  Respiratory: Negative for cough, choking, chest tightness, shortness of breath, wheezing and stridor.   Cardiovascular: Negative for chest pain, palpitations and leg swelling.  Gastrointestinal: Negative for abdominal distention.  Genitourinary: Negative for dysuria, urgency, frequency, hematuria, flank pain, decreased urine volume, difficulty urinating and dyspareunia.  Musculoskeletal: Negative for back pain, joint swelling, arthralgias and gait problem.  Neurological: Negative for dizziness, tremors, seizures, syncope, facial asymmetry, speech difficulty, weakness, light-headedness, numbness and headaches.  Hematological: Negative for adenopathy. Does  not bruise/bleed easily.  Psychiatric/Behavioral: Negative for hallucinations, behavioral problems, confusion, dysphoric mood, decreased concentration and agitation.    Objective:   Filed Vitals:   09/17/12 1247  BP: 148/100  Pulse: 86  Temp: 98.2 F (36.8 C)  Resp: 16    Physical Exam  Constitutional: Appears well-developed and well-nourished. No distress.  HENT: Normocephalic. External right and left ear normal. Oropharynx is clear and  moist.  Eyes: Conjunctivae and EOM are normal. PERRLA, no scleral icterus.  Neck: Normal ROM. Neck supple. No JVD. No tracheal deviation. No thyromegaly.  CVS: RRR, S1/S2 +, no murmurs, no gallops, no carotid bruit.  Pulmonary: Effort and breath sounds normal, no stridor, rhonchi, wheezes, rales.  Abdominal: Soft. BS +,  no distension, tenderness, rebound or guarding.  Musculoskeletal: Normal range of motion. No edema and no tenderness.  Lymphadenopathy: No lymphadenopathy noted, cervical, inguinal. Neuro: Alert. Normal reflexes, muscle tone coordination. No cranial nerve deficit. Skin: Skin is warm and dry. No rash noted. Not diaphoretic. No erythema. No pallor.  Psychiatric: Normal mood and affect. Behavior, judgment, thought content normal.   Lab Results  Component Value Date   WBC 10.5 09/06/2012   HGB 12.5 09/06/2012   HCT 34.5* 09/06/2012   MCV 89.4 09/06/2012   PLT 239 09/06/2012   Lab Results  Component Value Date   CREATININE 0.53 09/07/2012   BUN 10 09/07/2012   NA 136 09/07/2012   K 3.7 09/07/2012   CL 103 09/07/2012   CO2 23 09/07/2012    Lab Results  Component Value Date   HGBA1C 5.0 09/17/2012   Lipid Panel     Component Value Date/Time   CHOL 142 05/28/2012 1000   TRIG 49 05/28/2012 1000   HDL 66 05/28/2012 1000   CHOLHDL 2.2 05/28/2012 1000   VLDL 10 05/28/2012 1000   LDLCALC 66 05/28/2012 1000       Assessment and plan:   Patient Active Problem List   Diagnosis Date Noted  . Depression 07/21/2012  . Constipation 06/03/2012  . Cutaneous abscess of buttock 06/03/2012  . TIA (transient ischemic attack) 05/31/2012  . Multiple sclerosis, relapsing-remitting 05/29/2012  . Right leg weakness 05/28/2012  . Fall 05/28/2012  . Leukocytosis 05/28/2012  . Hypokalemia 05/28/2012  . BENIGN POSITIONAL VERTIGO 01/27/2010  . CARPAL TUNNEL SYNDROME, LEFT 04/14/2009  . SHOULDER STRAIN, LEFT 03/03/2009  . DIABETES MELLITUS, TYPE II 09/30/2006  . DYSLIPIDEMIA 09/30/2006  . TOBACCO  USER 09/30/2006  . HYPERTENSION 09/30/2006  . Multiple sclerosis 12/05/2005   Multiple sclerosis Appointment with neurology on 9/5 Outpatient physical therapy referral provided   Depression Psychiatry referral provided  Hypertension    Patient on HCTZ and metoprolol this was refilled We'll check her electrolytes today  Followup in 3 months at which time she will need repeat blood work  The patient was given clear instructions to go to ER or return to medical center if symptoms don't improve, worsen or new problems develop. The patient verbalized understanding. The patient was told to call to get any lab results if not heard anything in the next week.

## 2012-09-18 LAB — COMPREHENSIVE METABOLIC PANEL
Albumin: 4.3 g/dL (ref 3.5–5.2)
Alkaline Phosphatase: 69 U/L (ref 39–117)
BUN: 18 mg/dL (ref 6–23)
Glucose, Bld: 78 mg/dL (ref 70–99)
Total Bilirubin: 1.3 mg/dL — ABNORMAL HIGH (ref 0.3–1.2)

## 2012-09-18 LAB — CBC WITH DIFFERENTIAL/PLATELET
Eosinophils Absolute: 0.1 10*3/uL (ref 0.0–0.7)
Hemoglobin: 14.9 g/dL (ref 12.0–15.0)
Lymphocytes Relative: 30 % (ref 12–46)
Lymphs Abs: 3.3 10*3/uL (ref 0.7–4.0)
MCH: 31.8 pg (ref 26.0–34.0)
MCV: 92.5 fL (ref 78.0–100.0)
Monocytes Relative: 8 % (ref 3–12)
Neutrophils Relative %: 61 % (ref 43–77)
RBC: 4.68 MIL/uL (ref 3.87–5.11)

## 2012-09-19 ENCOUNTER — Other Ambulatory Visit: Payer: Self-pay | Admitting: Internal Medicine

## 2012-09-19 MED ORDER — HYDROCHLOROTHIAZIDE 25 MG PO TABS: 25.0000 mg | ORAL_TABLET | Freq: Every day | ORAL | Status: DC

## 2012-09-19 MED ORDER — METOPROLOL TARTRATE 25 MG PO TABS: 25.0000 mg | ORAL_TABLET | Freq: Two times a day (BID) | ORAL | Status: DC

## 2012-09-19 MED ORDER — ASPIRIN 81 MG PO TABS: 81.0000 mg | ORAL_TABLET | Freq: Every day | ORAL | Status: DC

## 2012-09-19 MED ORDER — MECLIZINE HCL 25 MG PO TABS: 25.0000 mg | ORAL_TABLET | Freq: Three times a day (TID) | ORAL | Status: DC | PRN

## 2012-09-19 MED ORDER — METFORMIN HCL 500 MG PO TABS: 500.0000 mg | ORAL_TABLET | Freq: Every day | ORAL | Status: DC

## 2012-09-19 MED ORDER — GLUCOSE BLOOD VI STRP: ORAL_STRIP | Status: DC

## 2012-09-19 MED ORDER — POTASSIUM CHLORIDE CRYS ER 20 MEQ PO TBCR: 20.0000 meq | EXTENDED_RELEASE_TABLET | Freq: Every day | ORAL | Status: DC

## 2012-09-26 ENCOUNTER — Ambulatory Visit: Payer: Self-pay | Admitting: Neurology

## 2012-10-08 ENCOUNTER — Ambulatory Visit: Payer: Medicaid Other | Attending: Internal Medicine | Admitting: Rehabilitative and Restorative Service Providers"

## 2012-10-16 ENCOUNTER — Ambulatory Visit: Payer: Self-pay | Admitting: Neurology

## 2012-11-04 ENCOUNTER — Ambulatory Visit: Payer: Medicaid Other | Admitting: Physical Therapy

## 2012-11-20 ENCOUNTER — Other Ambulatory Visit: Payer: Self-pay | Admitting: Internal Medicine

## 2012-12-17 ENCOUNTER — Ambulatory Visit: Payer: Medicaid Other | Admitting: Internal Medicine

## 2012-12-22 ENCOUNTER — Other Ambulatory Visit: Payer: Self-pay | Admitting: Internal Medicine

## 2013-01-19 ENCOUNTER — Other Ambulatory Visit: Payer: Self-pay | Admitting: Internal Medicine

## 2013-01-22 HISTORY — PX: PEG TUBE PLACEMENT: SUR1034

## 2013-03-30 ENCOUNTER — Ambulatory Visit: Payer: Medicaid Other | Admitting: Internal Medicine

## 2013-04-01 ENCOUNTER — Telehealth: Payer: Self-pay | Admitting: Internal Medicine

## 2013-04-01 NOTE — Telephone Encounter (Signed)
Spoke with pt son regarding mother need for rehab referral. Informed son, mother needs OV since no visit 08/2012. appt scheduled 04/30/13 @ 930 am

## 2013-04-01 NOTE — Telephone Encounter (Signed)
Pt's son calling in regards to pt's rehab referral.  Pt has mobility issues and wanted to know if she can have a referral without coming in person for office visit.  Pt was advised that because it has been several months since her last visit she would probably need an appointment but she would rather speak to clinician first.

## 2013-04-29 ENCOUNTER — Emergency Department (HOSPITAL_COMMUNITY)
Admission: EM | Admit: 2013-04-29 | Discharge: 2013-04-30 | Disposition: A | Discharge status: Home / Self Care | Payer: Medicare Other | Attending: Emergency Medicine | Admitting: Emergency Medicine

## 2013-04-29 ENCOUNTER — Encounter (HOSPITAL_COMMUNITY): Payer: Self-pay | Admitting: Emergency Medicine

## 2013-04-29 DIAGNOSIS — E876 Hypokalemia: Secondary | ICD-10-CM

## 2013-04-29 DIAGNOSIS — Z8673 Personal history of transient ischemic attack (TIA), and cerebral infarction without residual deficits: Secondary | ICD-10-CM | POA: Insufficient documentation

## 2013-04-29 DIAGNOSIS — R32 Unspecified urinary incontinence: Secondary | ICD-10-CM | POA: Insufficient documentation

## 2013-04-29 DIAGNOSIS — Z87891 Personal history of nicotine dependence: Secondary | ICD-10-CM | POA: Insufficient documentation

## 2013-04-29 DIAGNOSIS — E119 Type 2 diabetes mellitus without complications: Secondary | ICD-10-CM | POA: Insufficient documentation

## 2013-04-29 DIAGNOSIS — Z79899 Other long term (current) drug therapy: Secondary | ICD-10-CM | POA: Insufficient documentation

## 2013-04-29 DIAGNOSIS — I10 Essential (primary) hypertension: Secondary | ICD-10-CM | POA: Insufficient documentation

## 2013-04-29 DIAGNOSIS — E785 Hyperlipidemia, unspecified: Secondary | ICD-10-CM | POA: Insufficient documentation

## 2013-04-29 DIAGNOSIS — Z7982 Long term (current) use of aspirin: Secondary | ICD-10-CM | POA: Insufficient documentation

## 2013-04-29 DIAGNOSIS — G35 Multiple sclerosis: Secondary | ICD-10-CM

## 2013-04-29 DIAGNOSIS — M6281 Muscle weakness (generalized): Secondary | ICD-10-CM | POA: Insufficient documentation

## 2013-04-29 NOTE — ED Notes (Signed)
Pt states she is weaker today than usually . Pt has MS and is currently incont and not eating. Per EMS pt son can no longer take care of her and pt states this time she is willing to accept help.

## 2013-04-29 NOTE — ED Notes (Signed)
Bed: WA09 Expected date:  Expected time:  Means of arrival:  Comments: EMS/43 yo failure to thrive

## 2013-04-30 ENCOUNTER — Ambulatory Visit: Payer: Medicaid Other

## 2013-04-30 LAB — BASIC METABOLIC PANEL
BUN: 15 mg/dL (ref 6–23)
CO2: 28 mEq/L (ref 19–32)
Calcium: 9.8 mg/dL (ref 8.4–10.5)
Chloride: 102 mEq/L (ref 96–112)
Creatinine, Ser: 0.58 mg/dL (ref 0.50–1.10)
GFR calc non Af Amer: >90 mL/min (ref >90)
Glucose, Bld: 118 mg/dL — ABNORMAL HIGH (ref 70–99)
POTASSIUM: 2.6 meq/L — AB (ref 3.7–5.3)
SODIUM: 142 meq/L (ref 137–147)

## 2013-04-30 LAB — CBC WITH DIFFERENTIAL/PLATELET
BASOS PCT: 0 % (ref 0–1)
Basophils Absolute: 0 10*3/uL (ref 0.0–0.1)
EOS ABS: 0 10*3/uL (ref 0.0–0.7)
Eosinophils Relative: 0 % (ref 0–5)
HCT: 39.6 % (ref 36.0–46.0)
Hemoglobin: 14 g/dL (ref 12.0–15.0)
LYMPHS ABS: 2.8 10*3/uL (ref 0.7–4.0)
Lymphocytes Relative: 31 % (ref 12–46)
MCH: 32.6 pg (ref 26.0–34.0)
MCHC: 35.4 g/dL (ref 30.0–36.0)
MCV: 92.1 fL (ref 78.0–100.0)
Monocytes Absolute: 0.8 10*3/uL (ref 0.1–1.0)
Monocytes Relative: 9 % (ref 3–12)
NEUTROS PCT: 60 % (ref 43–77)
Neutro Abs: 5.3 10*3/uL (ref 1.7–7.7)
PLATELETS: 214 10*3/uL (ref 150–400)
RBC: 4.3 MIL/uL (ref 3.87–5.11)
RDW: 11.7 % (ref 11.5–15.5)
WBC: 8.9 10*3/uL (ref 4.0–10.5)

## 2013-04-30 LAB — URINALYSIS, ROUTINE W REFLEX MICROSCOPIC
Bilirubin Urine: NEGATIVE
Glucose, UA: NEGATIVE mg/dL
Hgb urine dipstick: NEGATIVE
Ketones, ur: NEGATIVE mg/dL
NITRITE: NEGATIVE
Protein, ur: NEGATIVE mg/dL
Specific Gravity, Urine: 1.024 (ref 1.005–1.030)
UROBILINOGEN UA: 1 mg/dL (ref 0.0–1.0)
pH: 7 (ref 5.0–8.0)

## 2013-04-30 LAB — URINE MICROSCOPIC-ADD ON

## 2013-04-30 MED ORDER — POTASSIUM CHLORIDE CRYS ER 20 MEQ PO TBCR
40.0000 meq | EXTENDED_RELEASE_TABLET | Freq: Once | ORAL | Status: AC
  Administered 2013-04-30: 40 meq via ORAL
  Filled 2013-04-30: qty 2

## 2013-04-30 MED ORDER — POTASSIUM CHLORIDE 10 MEQ/100ML IV SOLN
10.0000 meq | INTRAVENOUS | Status: AC
  Administered 2013-04-30 (×2): 10 meq via INTRAVENOUS
  Filled 2013-04-30 (×2): qty 100

## 2013-04-30 MED ORDER — POTASSIUM CHLORIDE CRYS ER 20 MEQ PO TBCR: 20.0000 meq | EXTENDED_RELEASE_TABLET | Freq: Every day | ORAL | Status: DC

## 2013-04-30 NOTE — ED Notes (Signed)
Called pt's son to let him know pt is ready for pick and received no answer. Left a message. Theo DillsQuinten Seebeck [phone] or [phone]

## 2013-04-30 NOTE — ED Provider Notes (Signed)
CSN: [id number]     Arrival date & time 04/29/13  2345 History   First MD Initiated Contact with Patient 04/30/13 0130     Chief Complaint  Patient presents with  . Failure To Thrive     (Consider location/radiation/quality/duration/timing/severity/associated sxs/prior Treatment) HPI 43 year old female presents to emergency department from home with complaint of weakness of her lower legs, intermittent, incontinence, poor appetite.  Patient has history of multiple sclerosis, diabetes, hypertension, and hypokalemia.  Patient reports she feels that her potassium was low.  Causing her weakness.  She reports that her incontinence is intermittent, and when her legs are weak.  She has a hard time getting to the bathroom.  Patient has an appointment with her primary care Dr. tomorrow for evaluation for rehabilitation therapy and in-home care.  Patient reports with her MS, her symptoms.  Will flare for a few days, and then resolve on their own.  No fever, chills, nausea, vomiting, diarrhea.  Patient is able to ambulate with a walker at her baseline, and reports she is still able to stand and ambulate, though it takes her longer, currently.  Weakness started yesterday. Past Medical History  Diagnosis Date  . Multiple sclerosis   . HYPERTENSION 09/30/2006  . DIABETES MELLITUS, TYPE II 09/30/2006  . TIA (transient ischemic attack)   . TOBACCO USER 09/30/2006    Qualifier: Diagnosis of  By: Delrae AlfredMulberry MD, [name]    . Multiple sclerosis, relapsing-remitting 05/29/2012  . DYSLIPIDEMIA 09/30/2006    Qualifier: Diagnosis of  By: Delrae AlfredMulberry MD, [name]    . DIABETES MELLITUS, TYPE II 09/30/2006    Qualifier: Diagnosis of  By: Delrae AlfredMulberry MD, [name]    . BENIGN POSITIONAL VERTIGO 01/27/2010    Qualifier: Diagnosis of  By: Delrae AlfredMulberry MD, [name]    . Stroke    History reviewed. No pertinent past surgical history. Family History  Problem Relation Age of Onset  . Hypertension Mother   . Kidney failure Mother   .  Hypertension Father   . Gout Father   . Hypertension Sister   . Diabetes Sister   . Hypertension Brother    History  Substance Use Topics  . Smoking status: Former Smoker -- 0.25 packs/day  . Smokeless tobacco: Not on file  . Alcohol Use: No   OB History   Grav Para Term Preterm Abortions TAB SAB Ect Mult Living                 Review of Systems  See History of Present Illness; otherwise all other systems are reviewed and negative   Allergies  Review of patient's allergies indicates no known allergies.  Home Medications   Current Outpatient Rx  Name  Route  Sig  Dispense  Refill  . aspirin 81 MG tablet   Oral   Take 1 tablet (81 mg total) by mouth daily.   90 tablet   11   . hydrochlorothiazide (HYDRODIURIL) 25 MG tablet   Oral   Take 1 tablet (25 mg total) by mouth daily.   90 tablet   11   . meclizine (ANTIVERT) 25 MG tablet   Oral   Take 1 tablet (25 mg total) by mouth 3 (three) times daily as needed for dizziness or nausea.   90 tablet   11   . metFORMIN (GLUCOPHAGE) 500 MG tablet   Oral   Take 1 tablet (500 mg total) by mouth daily with breakfast.   60 tablet   11   . metoprolol tartrate (  LOPRESSOR) 25 MG tablet      TAKE 1 TABLET TWICE A DAY   60 tablet   0   . potassium chloride SA (K-DUR,KLOR-CON) 20 MEQ tablet   Oral   Take 1 tablet (20 mEq total) by mouth daily.   30 tablet   0    BP 120/72  Pulse 80  Temp(Src) 98.5 F (36.9 C) (Oral)  Resp 18  Ht 5\' 2"  (1.575 m)  Wt 116 lb (52.617 kg)  BMI 21.21 kg/m2  SpO2 99% Physical Exam  Nursing note and vitals reviewed. Constitutional: She is oriented to person, place, and time. She appears well-developed and well-nourished.  Chronically ill-appearing female with slowed speech, but appears to understand and answer all questions appropriately  HENT:  Head: Normocephalic and atraumatic.  Nose: Nose normal.  Mouth/Throat: Oropharynx is clear and moist.  Eyes: Conjunctivae and EOM are  normal. Pupils are equal, round, and reactive to light.  Neck: Normal range of motion. Neck supple. No JVD present. No tracheal deviation present. No thyromegaly present.  Cardiovascular: Normal rate, regular rhythm, normal heart sounds and intact distal pulses.  Exam reveals no gallop and no friction rub.   No murmur heard. Pulmonary/Chest: Effort normal and breath sounds normal. No stridor. No respiratory distress. She has no wheezes. She has no rales. She exhibits no tenderness.  Abdominal: Soft. Bowel sounds are normal. She exhibits no distension and no mass. There is no tenderness. There is no rebound and no guarding.  Musculoskeletal: Normal range of motion. She exhibits no edema and no tenderness.  Lymphadenopathy:    She has no cervical adenopathy.  Neurological: She is alert and oriented to person, place, and time. She has normal reflexes. No cranial nerve deficit. She exhibits normal muscle tone. Coordination normal.  Patient is able to lift legs off the bed, strength, 5 out of 5.  Patient has slowed shuffling gait, but is able to ambulate with a walker  Skin: Skin is warm and dry. No rash noted. No erythema. No pallor.  Psychiatric: She has a normal mood and affect. Her behavior is normal. Judgment and thought content normal.    ED Course  Procedures (including critical care time) Labs Review Labs Reviewed  BASIC METABOLIC PANEL - Abnormal; Notable for the following:    Potassium 2.6 (*)    Glucose, Bld 118 (*)    All other components within normal limits  URINALYSIS, ROUTINE W REFLEX MICROSCOPIC - Abnormal; Notable for the following:    APPearance CLOUDY (*)    Leukocytes, UA SMALL (*)    All other components within normal limits  URINE MICROSCOPIC-ADD ON - Abnormal; Notable for the following:    Squamous Epithelial / LPF FEW (*)    All other components within normal limits  CBC WITH DIFFERENTIAL   Imaging Review No results found.   EKG Interpretation None      MDM    Final diagnoses:  Hypokalemia  Multiple sclerosis    43 year old female with history of MS, recent worsening, weakness.  Hypokalemia noted.  Weakness, most likely multifactorial with MS and hypokalemia contributing.  Patient to receive potassium repletion here in the emergency department.  I do not feel she requires admission to the hospital.  At this time, as she is ambulatory.  I strongly encouraged her to followup with her neurologist as well as her primary care Dr. that is scheduled later today.    Olivia Mackie, MD 04/30/13 [phone]

## 2013-04-30 NOTE — Discharge Instructions (Signed)
It is very important for you to follow up with your primary care doctor for assistance with in-home help, physical therapy, and other interventions.  Seeing a neurologist regularly will help control your multiple sclerosis, helping you live a fuller life!  Use the list below to supplement your diet with potassium rich foods.   Potassium Content of Foods Potassium is a mineral found in many foods and drinks. It helps keep fluids and minerals balanced in your body and also affects how steadily your heart beats. The body needs potassium to control blood pressure and to keep the muscles and nervous system healthy. However, certain health conditions and medicine may require you to eat more or less potassium-rich foods and drinks. Your caregiver or dietitian will tell you how much potassium you should have each day. COMMON SERVING SIZES The list below tells you how big or small common portion sizes are:  1 oz.........4 stacked dice.  3 oz........Marland KitchenDeck of cards.  1 tsp.......Marland KitchenTip of little finger.  1 tbsp....Marland KitchenMarland KitchenThumb.  2 tbsp....Marland KitchenMarland KitchenGolf ball.   c..........Marland KitchenHalf of a fist.  1 c...........Marland KitchenA fist. FOODS AND DRINKS HIGH IN POTASSIUM More than 200 mg of potassium per serving. A serving size is  c (120 mL or noted gram weight) unless otherwise stated. While all the items on this list are high in potassium, some items are higher in potassium than others. Fruits  Apricots (sliced), 83 g.  Apricots (dried halves), 3 oz / 24 g.  Avocado (cubed),  c / 50 g.  Banana (sliced), 75 g.  Cantaloupe (cubed), 80 g.  Dates (pitted), 5 whole / 35 g.  Figs (dried), 4 whole / 32 g.  Guava, c / 55 g.  Honeydew, 1 wedge / 85 g.  Kiwi (sliced), 90 g.  Nectarine, 1 small / 129 g.  Orange, 1 medium / 131 g.  Orange juice.  Pomegranate seeds, 87 g.  Pomegranate juice.  Prunes (pitted), 3 whole / 30 g.  Prune juice, 3 oz / 90 mL.  Seedless raisins, 3 tbsp / 27 g. Vegetables  Artichoke,  of  a medium / 64 g.  Asparagus (boiled), 90 g.  Baked beans,  c / 63 g.  Bamboo shoots,  c / 38 g.  Beets (cooked slices), 85 g.  Broccoli (boiled), 78 g.  Brussels sprout (boiled), 78 g.  Butternut squash (baked), 103 g.  Chickpea (cooked), 82 g.  Green peas (cooked), 80 g.  Hubbard squash (baked cubes),  c / 68 g.  Kidney beans (cooked), 5 tbsp / 55 g.  Lima beans (cooked),  c / 43 g.  Navy beans (cooked),  c / 61 g.  Potato (baked), 61 g.  Potato (boiled), 78 g.  Pumpkin (boiled), 123 g.  Refried beans,  c / 79 g.  Spinach (cooked),  c / 45 g.  Split peas (cooked),  c / 65 g.  Sun-dried tomatoes, 2 tbsp / 7 g.  Sweet potato (baked),  c / 50 g.  Tomato (chopped or sliced), 90 g.  Tomato juice.  Tomato paste, 4 tsp / 21 g.  Tomato sauce,  c / 61 g.  Vegetable juice.  White mushrooms (cooked), 78 g.  Yam (cooked or baked),  c / 34 g.  Zucchini squash (boiled), 90 g. Other Foods and Drinks  Almonds (whole),  c / 36 g.  Cashews (oil roasted),  c / 32 g.  Chocolate milk.  Chocolate pudding, 142 g.  Clams (steamed), 1.5 oz / 43 g.  Dark chocolate, 1.5 oz / 42 g.  Fish, 3 oz / 85 g.  King crab (steamed), 3 oz / 85 g.  Lobster (steamed), 4 oz / 113 g.  Milk (skim, 1%, 2%, whole), 1 c / 240 mL.  Milk chocolate, 2.3 oz / 66 g.  Milk shake.  Nonfat fruit variety yogurt, 123 g.  Peanuts (oil roasted), 1 oz / 28 g.  Peanut butter, 2 tbsp / 32 g.  Pistachio nuts, 1 oz / 28 g.  Pumpkin seeds, 1 oz / 28 g.  Red meat (broiled, cooked, grilled), 3 oz / 85 g.  Scallops (steamed), 3 oz / 85 g.  Shredded wheat cereal (dry), 3 oblong biscuits / 75 g.  Spaghetti sauce,  c / 66 g.  Sunflower seeds (dry roasted), 1 oz / 28 g.  Veggie burger, 1 patty / 70 g. FOODS MODERATE IN POTASSIUM Between 150 mg and 200 mg per serving. A serving is  c (120 mL or noted gram weight) unless otherwise stated. Fruits  Grapefruit,  of the  fruit / 123 g.  Grapefruit juice.  Pineapple juice.  Plums (sliced), 83 g.  Tangerine, 1 large / 120 g. Vegetables  Carrots (boiled), 78 g.  Carrots (sliced), 61 g.  Rhubarb (cooked with sugar), 120 g.  R[place]aga (cooked), 120 g.  Sweet corn (cooked), 75 g.  Yellow snap beans (cooked), 63 g. Other Foods and Drinks   Bagel, 1 bagel / 98 g.  Chicken breast (roasted and chopped),  c / 70 g.  Chocolate ice cream / 66 g.  Pita bread, 1 large / 64 g.  Shrimp (steamed), 4 oz / 113 g.  Swiss cheese (diced), 70 g.  Vanilla ice cream, 66 g.  Vanilla pudding, 140 g. FOODS LOW IN POTASSIUM Less than 150 mg per serving. A serving size is  cup (120 mL or noted gram weight) unless otherwise stated. If you eat more than 1 serving of a food low in potassium, the food may be considered a food high in potassium. Fruits  Apple (slices), 55 g.  Apple juice.  Applesauce, 122 g.  Blackberries, 72 g.  Blueberries, 74 g.  Cranberries, 50 g.  Cranberry juice.  Fruit cocktail, 119 g.  Fruit punch.  Grapes, 46 g.  Grape juice.  Mandarin oranges (canned), 126 g.  Peach (slices), 77 g.  Pineapple (chunks), 83 g.  Raspberries, 62 g.  Red cherries (without pits), 78 g.  Strawberries (sliced), 83 g.  Watermelon (diced), 76 g. Vegetables  Alfalfa sprouts, 17 g.  Bell peppers (sliced), 46 g.  Cabbage (shredded), 35 g.  Cauliflower (boiled), 62 g.  Celery, 51 g.  Collard greens (boiled), 95 g.  Cucumber (sliced), 52 g.  Eggplant (cubed), 41 g.  Green beans (boiled), 63 g.  Lettuce (shredded), 1 c / 36 g.  Onions (sauteed), 44 g.  Radishes (sliced), 58 g.  Spaghetti squash, 51 g. Other Foods and Drinks  [employer], 1 slice / 28 g.  Black tea.  Brown rice (cooked), 98 g.  Butter croissant, 1 medium / 57 g.  Carbonated soda.  Coffee.  Cheddar cheese (diced), 66 g.  Corn flake cereal (dry), 14 g.  Cottage cheese, 118 g.  Cream  of rice cereal (cooked), 122 g.  Cream of wheat cereal (cooked), 126 g.  Crisped rice cereal (dry), 14 g.  Egg (boiled, fried, poached, omelet, scrambled), 1 large / 46 61 g.  English muffin, 1 muffin / 57 g.  Frozen ice pop, 1 pop / 55 g.  Graham cracker, 1 large rectangular cracker / 14 g.  Jelly beans, 112 g.  Non-dairy whipped topping.  Oatmeal, 88 g.  Orange sherbet, 74 g.  Puffed rice cereal (dry), 7 g.  Pasta (cooked), 70 g.  Rice cakes, 4 cakes / 36 g.  Sugared doughnut, 4 oz / 116 g.  White bread, 1 slice / 30 g.  White rice (cooked), 79 93 g.  Wild rice (cooked), 82 g.  Yellow cake, 1 slice / 68 g. Document Released: 08/22/2004 Document Revised: 12/26/2011 Document Reviewed: 05/25/2011 [facility] Patient Information 2014 [place], [place].  Hypokalemia Hypokalemia means that the amount of potassium in the blood is lower than normal.Potassium is a chemical, called an electrolyte, that helps regulate the amount of fluid in the body. It also stimulates muscle contraction and helps nerves function properly.Most of the body's potassium is inside of cells, and only a very small amount is in the blood. Because the amount in the blood is so small, minor changes can be life-threatening. CAUSES  Antibiotics.  Diarrhea or vomiting.  Using laxatives too much, which can cause diarrhea.  Chronic kidney disease.  Water pills (diuretics).  Eating disorders (bulimia).  Low magnesium level.  Sweating a lot. SIGNS AND SYMPTOMS  Weakness.  Constipation.  Fatigue.  Muscle cramps.  Mental confusion.  Skipped heartbeats or irregular heartbeat (palpitations).  Tingling or numbness. DIAGNOSIS  Your health care provider can diagnose hypokalemia with blood tests. In addition to checking your potassium level, your health care provider may also check other lab tests. TREATMENT Hypokalemia can be treated with potassium supplements taken by mouth or adjustments  in your current medicines. If your potassium level is very low, you may need to get potassium through a vein (IV) and be monitored in the hospital. A diet high in potassium is also helpful. Foods high in potassium are:  Nuts, such as peanuts and pistachios.  Seeds, such as sunflower seeds and pumpkin seeds.  Peas, lentils, and lima beans.  Whole grain and bran cereals and breads.  Fresh fruit and vegetables, such as apricots, avocado, bananas, cantaloupe, kiwi, oranges, tomatoes, asparagus, and potatoes.  Orange and tomato juices.  Red meats.  Fruit yogurt. HOME CARE INSTRUCTIONS  Take all medicines as prescribed by your health care provider.  Maintain a healthy diet by including nutritious food, such as fruits, vegetables, nuts, whole grains, and lean meats.  If you are taking a laxative, be sure to follow the directions on the label. SEEK MEDICAL CARE IF:  Your weakness gets worse.  You feel your heart pounding or racing.  You are vomiting or having diarrhea.  You are diabetic and having trouble keeping your blood glucose in the normal range. SEEK IMMEDIATE MEDICAL CARE IF:  You have chest pain, shortness of breath, or dizziness.  You are vomiting or having diarrhea for more than 2 days.  You faint. MAKE SURE YOU:   Understand these instructions.  Will watch your condition.  Will get help right away if you are not doing well or get worse. Document Released: 01/08/2005 Document Revised: 10/29/2012 Document Reviewed: 07/11/2012 [facility] Patient Information 2014 [place], [place].  Multiple Sclerosis Multiple sclerosis (MS) is a disease of the central nervous system. It leads to loss of the insulating covering of the nerves (myelin sheath) of your brain. When this happens, brain signals do not get transmitted properly or may not get transmitted at all. The symptoms of MS occur in episodes  or attacks. These attacks may last weeks to months. There may be long periods of  nearly no problems between attacks. The age of onset of MS varies.  CAUSES The cause of MS is unknown. However, it is more common in the [place] than in the [place]. RISK FACTORS There is a higher incidence of MS in women than in men. MS is not an inherited illness, although your risk of MS is higher if you have a relative with MS. SIGNS AND SYMPTOMS  The symptoms of MS occur in episodes or attacks. These attacks may last weeks to months. There may be long periods of almost no symptoms between attacks. The symptoms of MS vary. This is because of the many different ways it affects the central nervous system. The main symptoms of MS include:  Vision problems and eye pain.  Numbness.  Weakness.  Paralysis in your arms, hands, feet, and legs (extremities).  Balance problems.  Tremors. DIAGNOSIS  Your health care provider can diagnose MS with the help of imaging exams and lab tests. These may include specialized X-ray exams and spinal fluid tests. The best imaging exam to confirm a diagnosis of MS is MRI. TREATMENT  There is no known cure for MS, but there are medicines that can decrease the number and frequency of attacks. Steroids are often used for short-term relief. Physical and occupational therapy may also help. HOME CARE INSTRUCTIONS   Take medicines as directed by your health care provider.  Exercise as directed by your health care provider. SEEK MEDICAL CARE IF: You begin to feel depressed. SEEK IMMEDIATE MEDICAL CARE IF:  You develop paralysis.  You develop problems with bladder, bowel, or sexual function.  You develop mental changes, such as forgetfulness or mood swings.  You have a seizure. Document Released: 01/06/2000 Document Revised: 10/29/2012 Document Reviewed: 09/15/2012 [facility] Patient [facility] 2014 [place] [employer].

## 2013-05-09 ENCOUNTER — Observation Stay (HOSPITAL_COMMUNITY)
Admission: EM | Admit: 2013-05-09 | Discharge: 2013-05-12 | Disposition: A | Discharge status: Home / Self Care | Payer: Medicare Other | Attending: Internal Medicine | Admitting: Internal Medicine

## 2013-05-09 ENCOUNTER — Encounter (HOSPITAL_COMMUNITY): Payer: Self-pay | Admitting: Emergency Medicine

## 2013-05-09 ENCOUNTER — Emergency Department (HOSPITAL_COMMUNITY): Payer: Medicare Other

## 2013-05-09 DIAGNOSIS — N390 Urinary tract infection, site not specified: Secondary | ICD-10-CM | POA: Diagnosis not present

## 2013-05-09 DIAGNOSIS — Z8673 Personal history of transient ischemic attack (TIA), and cerebral infarction without residual deficits: Secondary | ICD-10-CM | POA: Diagnosis not present

## 2013-05-09 DIAGNOSIS — H8110 Benign paroxysmal vertigo, unspecified ear: Secondary | ICD-10-CM | POA: Diagnosis not present

## 2013-05-09 DIAGNOSIS — Z7982 Long term (current) use of aspirin: Secondary | ICD-10-CM | POA: Insufficient documentation

## 2013-05-09 DIAGNOSIS — E785 Hyperlipidemia, unspecified: Secondary | ICD-10-CM | POA: Diagnosis not present

## 2013-05-09 DIAGNOSIS — I10 Essential (primary) hypertension: Secondary | ICD-10-CM | POA: Insufficient documentation

## 2013-05-09 DIAGNOSIS — Z79899 Other long term (current) drug therapy: Secondary | ICD-10-CM | POA: Diagnosis not present

## 2013-05-09 DIAGNOSIS — R109 Unspecified abdominal pain: Secondary | ICD-10-CM | POA: Insufficient documentation

## 2013-05-09 DIAGNOSIS — IMO0002 Reserved for concepts with insufficient information to code with codable children: Secondary | ICD-10-CM

## 2013-05-09 DIAGNOSIS — G459 Transient cerebral ischemic attack, unspecified: Secondary | ICD-10-CM

## 2013-05-09 DIAGNOSIS — E119 Type 2 diabetes mellitus without complications: Secondary | ICD-10-CM | POA: Diagnosis not present

## 2013-05-09 DIAGNOSIS — G5600 Carpal tunnel syndrome, unspecified upper limb: Secondary | ICD-10-CM

## 2013-05-09 DIAGNOSIS — G35 Multiple sclerosis: Secondary | ICD-10-CM

## 2013-05-09 DIAGNOSIS — R197 Diarrhea, unspecified: Secondary | ICD-10-CM | POA: Diagnosis not present

## 2013-05-09 DIAGNOSIS — R5381 Other malaise: Secondary | ICD-10-CM | POA: Insufficient documentation

## 2013-05-09 DIAGNOSIS — F32A Depression, unspecified: Secondary | ICD-10-CM

## 2013-05-09 DIAGNOSIS — R5383 Other fatigue: Secondary | ICD-10-CM | POA: Diagnosis not present

## 2013-05-09 DIAGNOSIS — E876 Hypokalemia: Principal | ICD-10-CM | POA: Insufficient documentation

## 2013-05-09 DIAGNOSIS — F17200 Nicotine dependence, unspecified, uncomplicated: Secondary | ICD-10-CM

## 2013-05-09 DIAGNOSIS — R29898 Other symptoms and signs involving the musculoskeletal system: Secondary | ICD-10-CM

## 2013-05-09 DIAGNOSIS — W19XXXA Unspecified fall, initial encounter: Secondary | ICD-10-CM

## 2013-05-09 DIAGNOSIS — K5900 Constipation, unspecified: Secondary | ICD-10-CM

## 2013-05-09 DIAGNOSIS — R1110 Vomiting, unspecified: Secondary | ICD-10-CM | POA: Diagnosis not present

## 2013-05-09 DIAGNOSIS — Z87891 Personal history of nicotine dependence: Secondary | ICD-10-CM | POA: Diagnosis not present

## 2013-05-09 DIAGNOSIS — F329 Major depressive disorder, single episode, unspecified: Secondary | ICD-10-CM

## 2013-05-09 DIAGNOSIS — L03317 Cellulitis of buttock: Secondary | ICD-10-CM

## 2013-05-09 DIAGNOSIS — L0231 Cutaneous abscess of buttock: Secondary | ICD-10-CM

## 2013-05-09 DIAGNOSIS — D72829 Elevated white blood cell count, unspecified: Secondary | ICD-10-CM

## 2013-05-09 LAB — CBC WITH DIFFERENTIAL/PLATELET
Basophils Absolute: 0 10*3/uL (ref 0.0–0.1)
Basophils Relative: 0 % (ref 0–1)
EOS PCT: 1 % (ref 0–5)
Eosinophils Absolute: 0 10*3/uL (ref 0.0–0.7)
HCT: 40.1 % (ref 36.0–46.0)
Hemoglobin: 14.5 g/dL (ref 12.0–15.0)
LYMPHS ABS: 1.3 10*3/uL (ref 0.7–4.0)
Lymphocytes Relative: 14 % (ref 12–46)
MCH: 32.7 pg (ref 26.0–34.0)
MCHC: 36.2 g/dL — ABNORMAL HIGH (ref 30.0–36.0)
MCV: 90.3 fL (ref 78.0–100.0)
MONO ABS: 0.5 10*3/uL (ref 0.1–1.0)
Monocytes Relative: 6 % (ref 3–12)
Neutro Abs: 7 10*3/uL (ref 1.7–7.7)
Neutrophils Relative %: 79 % — ABNORMAL HIGH (ref 43–77)
Platelets: 253 10*3/uL (ref 150–400)
RBC: 4.44 MIL/uL (ref 3.87–5.11)
RDW: 11.6 % (ref 11.5–15.5)
WBC: 8.8 10*3/uL (ref 4.0–10.5)

## 2013-05-09 LAB — COMPREHENSIVE METABOLIC PANEL
ALT: 13 U/L (ref 0–35)
AST: 16 U/L (ref 0–37)
Albumin: 4.3 g/dL (ref 3.5–5.2)
Alkaline Phosphatase: 83 U/L (ref 39–117)
BUN: 16 mg/dL (ref 6–23)
CO2: 29 mEq/L (ref 19–32)
CREATININE: 0.71 mg/dL (ref 0.50–1.10)
Calcium: 10.1 mg/dL (ref 8.4–10.5)
Chloride: 99 mEq/L (ref 96–112)
GFR calc non Af Amer: >90 mL/min (ref >90)
GLUCOSE: 112 mg/dL — AB (ref 70–99)
Potassium: 2.8 mEq/L — CL (ref 3.7–5.3)
SODIUM: 142 meq/L (ref 137–147)
TOTAL PROTEIN: 7.9 g/dL (ref 6.0–8.3)
Total Bilirubin: 1.4 mg/dL — ABNORMAL HIGH (ref 0.3–1.2)

## 2013-05-09 LAB — LIPASE, BLOOD: LIPASE: 49 U/L (ref 11–59)

## 2013-05-09 LAB — TROPONIN I: Troponin I: <0.3 ng/mL (ref <0.30)

## 2013-05-09 LAB — PREGNANCY, URINE: PREG TEST UR: NEGATIVE

## 2013-05-09 MED ORDER — POTASSIUM CHLORIDE CRYS ER 20 MEQ PO TBCR
40.0000 meq | EXTENDED_RELEASE_TABLET | Freq: Once | ORAL | Status: AC
  Administered 2013-05-09: 40 meq via ORAL
  Filled 2013-05-09: qty 2

## 2013-05-09 MED ORDER — SODIUM CHLORIDE 0.9 % IV BOLUS (SEPSIS)
250.0000 mL | Freq: Once | INTRAVENOUS | Status: AC
  Administered 2013-05-09: 250 mL via INTRAVENOUS

## 2013-05-09 MED ORDER — POTASSIUM CHLORIDE 10 MEQ/100ML IV SOLN
10.0000 meq | Freq: Once | INTRAVENOUS | Status: AC
  Administered 2013-05-09: 10 meq via INTRAVENOUS
  Filled 2013-05-09: qty 100

## 2013-05-09 MED ORDER — ONDANSETRON 4 MG PO TBDP
4.0000 mg | ORAL_TABLET | Freq: Once | ORAL | Status: AC
Start: 2013-05-09 — End: 2013-05-09
  Administered 2013-05-09: 4 mg via ORAL
  Filled 2013-05-09: qty 1

## 2013-05-09 MED ORDER — SODIUM CHLORIDE 0.9 % IV SOLN
INTRAVENOUS | Status: DC
  Administered 2013-05-09: 75 mL/h via INTRAVENOUS

## 2013-05-09 NOTE — H&P (Signed)
[facility]                                                                                    Patient Demographics  [name] [name], is a 43 y.o. female  CSN: [id number]  MRN: [id number]  DOB - Mar 13, 1970  Admit Date - 05/09/2013  Outpatient Primary MD for the patient is Jeanann Lewandowsky, MD   With History of -  Past Medical History  Diagnosis Date  . Multiple sclerosis   . HYPERTENSION 09/30/2006  . DIABETES MELLITUS, TYPE II 09/30/2006  . TIA (transient ischemic attack)   . TOBACCO USER 09/30/2006    Qualifier: Diagnosis of  By: Delrae Alfred MD, Lanora Manis    . Multiple sclerosis, relapsing-remitting 05/29/2012  . DYSLIPIDEMIA 09/30/2006    Qualifier: Diagnosis of  By: Delrae Alfred MD, Lanora Manis    . DIABETES MELLITUS, TYPE II 09/30/2006    Qualifier: Diagnosis of  By: Delrae Alfred MD, Lanora Manis    . BENIGN POSITIONAL VERTIGO 01/27/2010    Qualifier: Diagnosis of  By: Delrae Alfred MD, Lanora Manis    . Stroke       History reviewed. No pertinent past surgical history.  in for   Chief Complaint  Patient presents with  . Weakness  . Emesis     HPI  [name] [name]  is a 43 y.o. female, with history of  diabetes mellitus, hypertension and recent admission for hypokalemia presenting today again for hypokalemia . The patient was discharged recently from our hospital after a similar diagnosis. The patient continued to have diarrhea and she was still on hydrochlorothiazide. Today her legs were giving out and her son suspected that his hypokalemia although he insists that he was giving her the potassium twice a day. In the room I noticed that the son was emotional about not receiving help to take care of his mother and wanted a Child psychotherapist consult to help him obtain home health.    Review of Systems    In addition to the HPI above,  No Fever-chills, No Headache, No changes with Vision or hearing, No problems swallowing food or Liquids, No Chest pain, Cough or Shortness of  Breath, No Abdominal pain, No Nausea or Vommitting,  No Blood in stool or Urine, No dysuria, No new skin rashes or bruises, No new joints pains-aches,  No new weakness, tingling, numbness in any extremity, No recent weight gain or loss, No polyuria, polydypsia or polyphagia, No significant Mental Stressors.  A full 10 point Review of Systems was done, except as stated above, all other Review of Systems were negative.   Social History History  Substance Use Topics  . Smoking status: Former Smoker -- 0.25 packs/day  . Smokeless tobacco: Not on file  . Alcohol Use: No     Family History Family History  Problem Relation Age of Onset  . Hypertension Mother   . Kidney failure Mother   . Hypertension Father   . Gout Father   . Hypertension Sister   . Diabetes Sister   . Hypertension Brother      Prior to Admission medications   Medication Sig Start Date End Date Taking? Authorizing Provider  aspirin 81 MG  tablet Take 1 tablet (81 mg total) by mouth daily. 09/19/12  Yes Dorothea Ogle, MD  hydrochlorothiazide (HYDRODIURIL) 25 MG tablet Take 1 tablet (25 mg total) by mouth daily. 09/19/12  Yes Dorothea Ogle, MD  meclizine (ANTIVERT) 25 MG tablet Take 1 tablet (25 mg total) by mouth 3 (three) times daily as needed for dizziness or nausea. 09/19/12  Yes Dorothea Ogle, MD  metFORMIN (GLUCOPHAGE) 500 MG tablet Take 1 tablet (500 mg total) by mouth daily with breakfast. 09/19/12  Yes Dorothea Ogle, MD  metoprolol tartrate (LOPRESSOR) 25 MG tablet TAKE 1 TABLET TWICE A DAY 12/22/12  Yes Richarda Overlie, MD  potassium chloride SA (K-DUR,KLOR-CON) 20 MEQ tablet Take 1 tablet (20 mEq total) by mouth daily. 04/30/13  Yes Olivia Mackie, MD    No Known Allergies  Physical Exam  Vitals  Blood pressure 153/95, pulse 74, temperature 98.4 F (36.9 C), temperature source Oral, resp. rate 18, SpO2 100.00%.   1. General chronically ill African American female, lying in bed in no significant distress  at this time  2. Normal affect and insight, Not Suicidal or Homicidal, Awake Alert, Oriented X 3.  3. No F.N deficits, ALL C.Nerves Intact, Strength 5/5 all 4 extremities,   4. Ears and Eyes appear Normal, Conjunctivae clear, PERRLA. Moist Oral Mucosa.  5. Supple Neck, No JVD, No cervical lymphadenopathy appriciated, No Carotid Bruits.  6. Symmetrical Chest wall movement, Good air movement bilaterally, CTAB.  7. RRR, No Gallops, Rubs or Murmurs, No Parasternal Heave.  8. Positive Bowel Sounds, Abdomen Soft, Non tender, No organomegaly appriciated,No rebound -guarding or rigidity.  9.  No Cyanosis, Normal Skin Turgor, No Skin Rash or Bruise.  10. Good muscle tone,  joints appear normal , no effusions, Normal ROM.  11. No Palpable Lymph Nodes in Neck or Axillae    Data Review  CBC  Recent Labs Lab 05/09/13 2020  WBC 8.8  HGB 14.5  HCT 40.1  PLT 253  MCV 90.3  MCH 32.7  MCHC 36.2*  RDW 11.6  LYMPHSABS 1.3  MONOABS 0.5  EOSABS 0.0  BASOSABS 0.0   ------------------------------------------------------------------------------------------------------------------  Chemistries   Recent Labs Lab 05/09/13 2020  NA 142  K 2.8*  CL 99  CO2 29  GLUCOSE 112*  BUN 16  CREATININE 0.71  CALCIUM 10.1  AST 16  ALT 13  ALKPHOS 83  BILITOT 1.4*   ------------------------------------------------------------------------------------------------------------------ CrCl is unknown because both a height and weight (above a minimum accepted value) are required for this calculation. ------------------------------------------------------------------------------------------------------------------ No results found for this basename: TSH, T4TOTAL, FREET3, T3FREE, THYROIDAB,  in the last 72 hours   Coagulation profile No results found for this basename: INR, PROTIME,  in the last 168  hours ------------------------------------------------------------------------------------------------------------------- No results found for this basename: DDIMER,  in the last 72 hours -------------------------------------------------------------------------------------------------------------------  Cardiac Enzymes  Recent Labs Lab 05/09/13 2020  TROPONINI <0.30   ------------------------------------------------------------------------------------------------------------------ No components found with this basename: POCBNP,    ---------------------------------------------------------------------------------------------------------------  Urinalysis    Component Value Date/Time   COLORURINE YELLOW 04/30/2013 0247   APPEARANCEUR CLOUDY* 04/30/2013 0247   LABSPEC 1.024 04/30/2013 0247   PHURINE 7.0 04/30/2013 0247   GLUCOSEU NEGATIVE 04/30/2013 0247   HGBUR NEGATIVE 04/30/2013 0247   HGBUR moderate 10/09/2007 1438   BILIRUBINUR NEGATIVE 04/30/2013 0247   KETONESUR NEGATIVE 04/30/2013 0247   PROTEINUR NEGATIVE 04/30/2013 0247   UROBILINOGEN 1.0 04/30/2013 0247   NITRITE NEGATIVE 04/30/2013 0247   LEUKOCYTESUR SMALL* 04/30/2013 0247    ----------------------------------------------------------------------------------------------------------------  .  Imaging results:   Dg Abd Acute W/chest  05/09/2013   CLINICAL DATA:  Weakness, abdominal pain.  EXAM: ACUTE ABDOMEN SERIES (ABDOMEN 2 VIEW & CHEST 1 VIEW)  COMPARISON:  05/27/2012  FINDINGS: There is no evidence of dilated bowel loops or free intraperitoneal air. No radiopaque calculi or other significant radiographic abnormality is seen. Heart size and mediastinal contours are within normal limits. Both lungs are clear.  IMPRESSION: Negative abdominal radiographs.  No acute cardiopulmonary disease.   Electronically Signed   By: Charlett NoseKevin  Dover M.D.   On: 05/09/2013 22:39    My personal review of EKG: Normal sinus rhythm nonspecific ST  changes    Assessment & Plan  1. hypokalemia; multifactorial. Seems to be due to HCTZ and diarrhea. 2. History of multiple sclerosis 3. Diabetes mellitus type 2 4. A social issue that needs to be addressed during this hospitalization for help with home health since the son cannot take care of her as much as before and she definitely cannot take care of herself  Plan  Replace potassium with IV fluids in addition to 20 mg IV KCl and 40 mEq KCl by mouth received in the ER Add FiberCon to diet Social services consult   DVT Prophylaxis Lovenox  AM Labs Ordered, also please review Full Orders  Family Communication: Admission, patients condition and plan of care including tests being ordered have been discussed with the patient and son who indicate understanding and agree with the plan and Code Status.  Code Status full  Disposition Plan: Home with home health  Time spent in minutes : 32 minutes  Condition fair   @SIGNATURE @

## 2013-05-09 NOTE — ED Provider Notes (Signed)
CSN: [id number]     Arrival date & time 05/09/13  2016 History   First MD Initiated Contact with Patient 05/09/13 2023     Chief Complaint  Patient presents with  . Weakness  . Emesis     (Consider location/radiation/quality/duration/timing/severity/associated sxs/prior Treatment) Patient is a 43 y.o. female presenting with weakness and vomiting. The history is provided by the patient.  Weakness Pertinent negatives include no chest pain, no abdominal pain and no headaches.  Emesis Associated symptoms: diarrhea   Associated symptoms: no abdominal pain and no headaches    patient came in by EMS. Patient with concern for her potassium being low again she had generalized weakness feeling does continue to have some loose bowel movements no frank diarrhea. Then and tonight had one episode of vomiting. Called the rescue squad to bring her in because she thought that her potassium to be low. Patient was admitted April 8 for failure to thrive and and had a low potassium at that time. Patients on oral potassium supplement she claims she's taking them. Patient denies any abdominal pain denies any chest pain or any shortness of breath.  Past Medical History  Diagnosis Date  . Multiple sclerosis   . HYPERTENSION 09/30/2006  . DIABETES MELLITUS, TYPE II 09/30/2006  . TIA (transient ischemic attack)   . TOBACCO USER 09/30/2006    Qualifier: Diagnosis of  By: Delrae AlfredMulberry MD, [name]    . Multiple sclerosis, relapsing-remitting 05/29/2012  . DYSLIPIDEMIA 09/30/2006    Qualifier: Diagnosis of  By: Delrae AlfredMulberry MD, [name]    . DIABETES MELLITUS, TYPE II 09/30/2006    Qualifier: Diagnosis of  By: Delrae AlfredMulberry MD, [name]    . BENIGN POSITIONAL VERTIGO 01/27/2010    Qualifier: Diagnosis of  By: Delrae AlfredMulberry MD, [name]    . Stroke    History reviewed. No pertinent past surgical history. Family History  Problem Relation Age of Onset  . Hypertension Mother   . Kidney failure Mother   . Hypertension Father   . Gout  Father   . Hypertension Sister   . Diabetes Sister   . Hypertension Brother    History  Substance Use Topics  . Smoking status: Former Smoker -- 0.25 packs/day  . Smokeless tobacco: Not on file  . Alcohol Use: No   OB History   Grav Para Term Preterm Abortions TAB SAB Ect Mult Living                 Review of Systems  Constitutional: Negative for fever.  HENT: Negative for congestion.   Eyes: Negative for redness.  Cardiovascular: Negative for chest pain.  Gastrointestinal: Positive for nausea, vomiting and diarrhea. Negative for abdominal pain and blood in stool.  Genitourinary: Negative for dysuria.  Musculoskeletal: Negative for back pain.  Skin: Negative for rash.  Neurological: Positive for weakness. Negative for headaches.  Hematological: Does not bruise/bleed easily.  Psychiatric/Behavioral: Negative for confusion.      Allergies  Review of patient's allergies indicates no known allergies.  Home Medications   Prior to Admission medications   Medication Sig Start Date End Date Taking? Authorizing Provider  aspirin 81 MG tablet Take 1 tablet (81 mg total) by mouth daily. 09/19/12  Yes Dorothea OgleIskra M Myers, MD  hydrochlorothiazide (HYDRODIURIL) 25 MG tablet Take 1 tablet (25 mg total) by mouth daily. 09/19/12  Yes Dorothea OgleIskra M Myers, MD  meclizine (ANTIVERT) 25 MG tablet Take 1 tablet (25 mg total) by mouth 3 (three) times daily as needed for dizziness or  nausea. 09/19/12  Yes Dorothea Ogle, MD  metFORMIN (GLUCOPHAGE) 500 MG tablet Take 1 tablet (500 mg total) by mouth daily with breakfast. 09/19/12  Yes Dorothea Ogle, MD  metoprolol tartrate (LOPRESSOR) 25 MG tablet TAKE 1 TABLET TWICE A DAY 12/22/12  Yes Richarda Overlie, MD  potassium chloride SA (K-DUR,KLOR-CON) 20 MEQ tablet Take 1 tablet (20 mEq total) by mouth daily. 04/30/13  Yes Olivia Mackie, MD   BP 153/95  Pulse 74  Temp(Src) 98.4 F (36.9 C) (Oral)  Resp 18  SpO2 100% Physical Exam  Nursing note and vitals  reviewed. Constitutional: She appears well-developed and well-nourished. No distress.  HENT:  Head: Normocephalic and atraumatic.  Eyes: Conjunctivae and EOM are normal. Pupils are equal, round, and reactive to light.  Neck: Normal range of motion.  Cardiovascular: Normal rate, regular rhythm and normal heart sounds.   Pulmonary/Chest: Effort normal and breath sounds normal. No respiratory distress.  Abdominal: Soft. Bowel sounds are normal. She exhibits no distension. There is no tenderness.  Neurological: She is alert. No cranial nerve deficit. She exhibits normal muscle tone. Coordination normal.  Skin: Skin is warm. No rash noted.    ED Course  Procedures (including critical care time) Labs Review Labs Reviewed  CBC WITH DIFFERENTIAL - Abnormal; Notable for the following:    MCHC 36.2 (*)    Neutrophils Relative % 79 (*)    All other components within normal limits  COMPREHENSIVE METABOLIC PANEL - Abnormal; Notable for the following:    Potassium 2.8 (*)    Glucose, Bld 112 (*)    Total Bilirubin 1.4 (*)    All other components within normal limits  LIPASE, BLOOD  TROPONIN I  PREGNANCY, URINE  URINALYSIS, ROUTINE W REFLEX MICROSCOPIC  I-STAT TROPOININ, ED   Results for orders placed during the hospital encounter of 05/09/13  CBC WITH DIFFERENTIAL      Result Value Ref Range   WBC 8.8  4.0 - 10.5 K/uL   RBC 4.44  3.87 - 5.11 MIL/uL   Hemoglobin 14.5  12.0 - 15.0 g/dL   HCT 16.1  09.6 - 04.5 %   MCV 90.3  78.0 - 100.0 fL   MCH 32.7  26.0 - 34.0 pg   MCHC 36.2 (*) 30.0 - 36.0 g/dL   RDW 40.9  81.1 - 91.4 %   Platelets 253  150 - 400 K/uL   Neutrophils Relative % 79 (*) 43 - 77 %   Neutro Abs 7.0  1.7 - 7.7 K/uL   Lymphocytes Relative 14  12 - 46 %   Lymphs Abs 1.3  0.7 - 4.0 K/uL   Monocytes Relative 6  3 - 12 %   Monocytes Absolute 0.5  0.1 - 1.0 K/uL   Eosinophils Relative 1  0 - 5 %   Eosinophils Absolute 0.0  0.0 - 0.7 K/uL   Basophils Relative 0  0 - 1 %    Basophils Absolute 0.0  0.0 - 0.1 K/uL  COMPREHENSIVE METABOLIC PANEL      Result Value Ref Range   Sodium 142  137 - 147 mEq/L   Potassium 2.8 (*) 3.7 - 5.3 mEq/L   Chloride 99  96 - 112 mEq/L   CO2 29  19 - 32 mEq/L   Glucose, Bld 112 (*) 70 - 99 mg/dL   BUN 16  6 - 23 mg/dL   Creatinine, Ser 7.82  0.50 - 1.10 mg/dL   Calcium 95.6  8.4 -  10.5 mg/dL   Total Protein 7.9  6.0 - 8.3 g/dL   Albumin 4.3  3.5 - 5.2 g/dL   AST 16  0 - 37 U/L   ALT 13  0 - 35 U/L   Alkaline Phosphatase 83  39 - 117 U/L   Total Bilirubin 1.4 (*) 0.3 - 1.2 mg/dL   GFR calc non Af Amer >90  >90 mL/min   GFR calc Af Amer >90  >90 mL/min  LIPASE, BLOOD      Result Value Ref Range   Lipase 49  11 - 59 U/L  TROPONIN I      Result Value Ref Range   Troponin I <0.30  <0.30 ng/mL     Imaging Review Dg Abd Acute W/chest  05/09/2013   CLINICAL DATA:  Weakness, abdominal pain.  EXAM: ACUTE ABDOMEN SERIES (ABDOMEN 2 VIEW & CHEST 1 VIEW)  COMPARISON:  05/27/2012  FINDINGS: There is no evidence of dilated bowel loops or free intraperitoneal air. No radiopaque calculi or other significant radiographic abnormality is seen. Heart size and mediastinal contours are within normal limits. Both lungs are clear.  IMPRESSION: Negative abdominal radiographs.  No acute cardiopulmonary disease.   Electronically Signed   By: Charlett Nose M.D.   On: 05/09/2013 22:39     EKG Interpretation   Date/Time:  Saturday May 09 2013 20:38:23 EDT Ventricular Rate:  76 PR Interval:  207 QRS Duration: 87 QT Interval:  411 QTC Calculation: 462 R Axis:   40 Text Interpretation:  Sinus rhythm Borderline prolonged PR interval  Abnormal R-wave progression, early transition Minimal ST depression,  anterolateral leads Baseline wander in lead(s) V6 Confirmed by Fatime Biswell   MD, Dawnette Mione ([id number]) on 05/09/2013 8:43:39 PM      MDM   Final diagnoses:  Hypokalemia    Patient recurrent hypokalemia despite just being out of the hospital for one  week. Patient's potassium on admission on April 8 was 2.6 today is 2.8. She continues to say that she has loose bowel movements had one episode of vomiting tonight she was concerned that her potassium was located she gets a generalized weakness feeling. It did occur today. The vomiting was 2 hours prior to arrival the emergency department. Patient's abdomen is soft flat no tenderness.  Discussed with the hospitalist. Patient given 10 mEq of IV potassium x2 and 40 mEq by mouth for the hypokalemia. Recommend patient be admitted at least overnight to make sure the potassium corrects itself. In addition social services may need to get involved to evaluate the home situation and make sure patient is taking her meds properly.  Shelda Jakes, MD 05/09/13 [phone]

## 2013-05-09 NOTE — ED Notes (Addendum)
Per EMS pt. Is from home with complaint of generalized weakness over a week now and vomiting 2 hours prior to arrival to ED, denied of any pain , no SOB. Alert and oriented x3. Pt. Was here a week ago with same complaint and received KCL.

## 2013-05-09 NOTE — ED Notes (Signed)
Bed: [id number] Expected date:  Expected time:  Means of arrival:  Comments: EMS gen weakness, n/v

## 2013-05-09 NOTE — ED Notes (Signed)
Placed pt on bedpan, Attempted to obtain urine specimen; pt unable to void at this time.

## 2013-05-10 DIAGNOSIS — E876 Hypokalemia: Secondary | ICD-10-CM | POA: Diagnosis not present

## 2013-05-10 LAB — URINE MICROSCOPIC-ADD ON

## 2013-05-10 LAB — BASIC METABOLIC PANEL
BUN: 12 mg/dL (ref 6–23)
CALCIUM: 8.9 mg/dL (ref 8.4–10.5)
CO2: 29 meq/L (ref 19–32)
CREATININE: 0.6 mg/dL (ref 0.50–1.10)
Chloride: 103 mEq/L (ref 96–112)
GFR calc Af Amer: >90 mL/min (ref >90)
GFR calc non Af Amer: >90 mL/min (ref >90)
Glucose, Bld: 111 mg/dL — ABNORMAL HIGH (ref 70–99)
Potassium: 3.4 mEq/L — ABNORMAL LOW (ref 3.7–5.3)
Sodium: 141 mEq/L (ref 137–147)

## 2013-05-10 LAB — URINALYSIS, ROUTINE W REFLEX MICROSCOPIC
Bilirubin Urine: NEGATIVE
GLUCOSE, UA: NEGATIVE mg/dL
Hgb urine dipstick: NEGATIVE
Ketones, ur: NEGATIVE mg/dL
Nitrite: NEGATIVE
PROTEIN: NEGATIVE mg/dL
Specific Gravity, Urine: 1.021 (ref 1.005–1.030)
UROBILINOGEN UA: 0.2 mg/dL (ref 0.0–1.0)
pH: 8 (ref 5.0–8.0)

## 2013-05-10 LAB — GLUCOSE, CAPILLARY
GLUCOSE-CAPILLARY: 76 mg/dL (ref 70–99)
GLUCOSE-CAPILLARY: 92 mg/dL (ref 70–99)
GLUCOSE-CAPILLARY: 114 mg/dL — AB (ref 70–99)

## 2013-05-10 LAB — HEMOGLOBIN A1C
Hgb A1c MFr Bld: 5.2 % (ref <5.7)
Mean Plasma Glucose: 103 mg/dL (ref <117)

## 2013-05-10 LAB — MAGNESIUM: Magnesium: 2 mg/dL (ref 1.5–2.5)

## 2013-05-10 MED ORDER — POTASSIUM CHLORIDE CRYS ER 20 MEQ PO TBCR
40.0000 meq | EXTENDED_RELEASE_TABLET | Freq: Every day | ORAL | Status: DC
  Administered 2013-05-11 – 2013-05-12 (×2): 40 meq via ORAL
  Filled 2013-05-10 (×2): qty 2

## 2013-05-10 MED ORDER — ACETAMINOPHEN 325 MG PO TABS: 650.0000 mg | ORAL_TABLET | Freq: Four times a day (QID) | ORAL | Status: DC | PRN

## 2013-05-10 MED ORDER — INSULIN ASPART 100 UNIT/ML ~~LOC~~ SOLN
0.0000 [IU] | Freq: Three times a day (TID) | SUBCUTANEOUS | Status: DC
  Administered 2013-05-11: 1 [IU] via SUBCUTANEOUS

## 2013-05-10 MED ORDER — CALCIUM POLYCARBOPHIL 625 MG PO TABS
1250.0000 mg | ORAL_TABLET | Freq: Every day | ORAL | Status: DC
  Administered 2013-05-10 – 2013-05-12 (×3): 1250 mg via ORAL
  Filled 2013-05-10 (×3): qty 2

## 2013-05-10 MED ORDER — METOPROLOL TARTRATE 25 MG PO TABS
25.0000 mg | ORAL_TABLET | Freq: Two times a day (BID) | ORAL | Status: DC
  Administered 2013-05-10 – 2013-05-12 (×5): 25 mg via ORAL
  Filled 2013-05-10 (×6): qty 1

## 2013-05-10 MED ORDER — ASPIRIN EC 81 MG PO TBEC
81.0000 mg | DELAYED_RELEASE_TABLET | Freq: Every day | ORAL | Status: DC
  Administered 2013-05-10 – 2013-05-12 (×3): 81 mg via ORAL
  Filled 2013-05-10 (×3): qty 1

## 2013-05-10 MED ORDER — SODIUM CHLORIDE 0.9 % IJ SOLN
3.0000 mL | Freq: Two times a day (BID) | INTRAMUSCULAR | Status: DC
  Administered 2013-05-10 – 2013-05-12 (×5): 3 mL via INTRAVENOUS

## 2013-05-10 MED ORDER — ACETAMINOPHEN 650 MG RE SUPP: 650.0000 mg | Freq: Four times a day (QID) | RECTAL | Status: DC | PRN

## 2013-05-10 MED ORDER — MECLIZINE HCL 25 MG PO TABS
25.0000 mg | ORAL_TABLET | Freq: Three times a day (TID) | ORAL | Status: DC | PRN
  Administered 2013-05-12: 25 mg via ORAL
  Filled 2013-05-10: qty 1

## 2013-05-10 MED ORDER — POTASSIUM CHLORIDE IN NACL 20-0.45 MEQ/L-% IV SOLN
INTRAVENOUS | Status: DC
  Administered 2013-05-10: 01:00:00 via INTRAVENOUS
  Filled 2013-05-10 (×2): qty 1000

## 2013-05-10 MED ORDER — ONDANSETRON HCL 4 MG/2ML IJ SOLN: 4.0000 mg | Freq: Four times a day (QID) | INTRAMUSCULAR | Status: DC | PRN

## 2013-05-10 MED ORDER — CEFTRIAXONE SODIUM 1 G IJ SOLR
1.0000 g | INTRAMUSCULAR | Status: DC
  Administered 2013-05-10: 1 g via INTRAVENOUS
  Filled 2013-05-10 (×2): qty 10

## 2013-05-10 MED ORDER — POTASSIUM CHLORIDE CRYS ER 20 MEQ PO TBCR
20.0000 meq | EXTENDED_RELEASE_TABLET | Freq: Every day | ORAL | Status: DC
  Administered 2013-05-10: 20 meq via ORAL
  Filled 2013-05-10: qty 1

## 2013-05-10 MED ORDER — METFORMIN HCL 500 MG PO TABS
500.0000 mg | ORAL_TABLET | Freq: Every day | ORAL | Status: DC
  Administered 2013-05-10: 500 mg via ORAL
  Filled 2013-05-10 (×2): qty 1

## 2013-05-10 MED ORDER — HYDROCODONE-ACETAMINOPHEN 5-325 MG PO TABS: 1.0000 | ORAL_TABLET | ORAL | Status: DC | PRN

## 2013-05-10 MED ORDER — ONDANSETRON HCL 4 MG PO TABS
4.0000 mg | ORAL_TABLET | Freq: Four times a day (QID) | ORAL | Status: DC | PRN
  Administered 2013-05-11: 4 mg via ORAL
  Filled 2013-05-10: qty 1

## 2013-05-10 MED ORDER — HYDROCHLOROTHIAZIDE 25 MG PO TABS
25.0000 mg | ORAL_TABLET | Freq: Every day | ORAL | Status: DC
  Administered 2013-05-10: 25 mg via ORAL
  Filled 2013-05-10: qty 1

## 2013-05-10 MED ORDER — ENOXAPARIN SODIUM 40 MG/0.4ML ~~LOC~~ SOLN
40.0000 mg | SUBCUTANEOUS | Status: DC
  Administered 2013-05-10 – 2013-05-12 (×3): 40 mg via SUBCUTANEOUS
  Filled 2013-05-10 (×3): qty 0.4

## 2013-05-10 NOTE — Progress Notes (Addendum)
Patient ID: [name] [name], female   DOB: 1970/04/04, 43 y.o.   MRN: [id number]  [facility]  [name] LeaDeirdre K [name] [id number] DOB: 1970/04/04 DOA: 05/09/2013 PCP: Jeanann LewandowskyJEGEDE, [name], MD  Brief narrative: 43 y.o. female, with history of diabetes mellitus, hypertension and recent admission for hypokalemia presenting today again for hypokalemia . The patient was discharged recently from our hospital after a similar diagnosis. The patient continued to have diarrhea and she was still on hydrochlorothiazide. The day prior to this admission, her legs were giving out and her son suspected that it is due to hypokalemia although he insisted that he was giving her the potassium twice a day.  Principal Problem:   Hypokalemia - unclear etiology, possibly from diarrhea - will check mg level, continue to supplement potassium as needed - stop IVF and place on oral K-dur as pt tolerating regular diet well    Diarrhea - will ask for stool GI panel, stool culture, O&P - supportive care provided    Diabetes mellitus type II - hold Metformin - metformin can be cause of diarrhea - place on SSI for now and check A1C   HTN - hold HCTZ for now and continue Metoprolol   UTI - suggestive based on UA - will ask for urine culture - place on empiric ABX Rocephin   Consultants:  None   Procedures/Studies: Dg Abd Acute W/chest   05/09/2013  Negative abdominal radiographs.  No acute cardiopulmonary disease.     Antibiotics:  None  Code Status: Full Family Communication: Pt at bedside Disposition Plan: Home when medically stable  HPI/Subjective: No events overnight.   Objective: Filed Vitals:   05/09/13 2100 05/09/13 2341 05/10/13 0029 05/10/13 0630  BP:  169/97 165/86 111/79  Pulse:  82 76 73  Temp: 98.4 F (36.9 C) 98.8 F (37.1 C) 98.4 F (36.9 C) 97.7 F (36.5 C)  TempSrc:   Oral Oral  Resp:  16 16 16   Height:   5\' 1"  (1.549 m)   Weight:   57.788 kg (127 lb 6.4 oz)    SpO2:  100% 100% 100%    Intake/Output Summary (Last 24 hours) at 05/10/13 1314 Last data filed at 05/10/13 1228  Gross per 24 hour  Intake    360 ml  Output    300 ml  Net     60 ml    Exam:   General:  Pt is alert, follows commands appropriately, not in acute distress  Cardiovascular: Regular rate and rhythm, S1/S2, no murmurs, no rubs, no gallops  Respiratory: Clear to auscultation bilaterally, no wheezing, no crackles, no rhonchi  Abdomen: Soft, non tender, non distended, bowel sounds present, no guarding  Extremities: No edema, pulses DP and PT palpable bilaterally  Neuro: Grossly nonfocal  Data Reviewed: Basic Metabolic Panel:  Recent Labs Lab 05/09/13 2020 05/10/13 0450  NA 142 141  K 2.8* 3.4*  CL 99 103  CO2 29 29  GLUCOSE 112* 111*  BUN 16 12  CREATININE 0.71 0.60  CALCIUM 10.1 8.9   Liver Function Tests:  Recent Labs Lab 05/09/13 2020  AST 16  ALT 13  ALKPHOS 83  BILITOT 1.4*  PROT 7.9  ALBUMIN 4.3    Recent Labs Lab 05/09/13 2020  LIPASE 49   No results found for this basename: AMMONIA,  in the last 168 hours CBC:  Recent Labs Lab 05/09/13 2020  WBC 8.8  NEUTROABS 7.0  HGB 14.5  HCT 40.1  MCV 90.3  PLT  253   Cardiac Enzymes:  Recent Labs Lab 05/09/13 2020  TROPONINI <0.30   CBG:  Recent Labs Lab 05/10/13 0756  GLUCAP 76   Scheduled Meds: . aspirin EC  81 mg Oral Daily  . enoxaparin (LOVENOX) injection  40 mg Subcutaneous Q24H  . hydrochlorothiazide  25 mg Oral Daily  . metFORMIN  500 mg Oral Q breakfast  . metoprolol tartrate  25 mg Oral BID  . polycarbophil  1,250 mg Oral Daily  . potassium chloride SA  20 mEq Oral Daily  . sodium chloride  3 mL Intravenous Q12H   Continuous Infusions: . 0.45 % NaCl with KCl 20 mEq / L 75 mL/hr at 05/10/13 0049  . sodium chloride 75 mL/hr (05/09/13 2225)   Dorothea Ogle, MD  [facility] Pager [phone]  If 7PM-7AM, please contact night-coverage [email or web address] Password  [email or web address] 05/10/2013, 1:14 PM   LOS: 1 day

## 2013-05-11 DIAGNOSIS — E876 Hypokalemia: Secondary | ICD-10-CM | POA: Diagnosis not present

## 2013-05-11 LAB — BASIC METABOLIC PANEL
BUN: 11 mg/dL (ref 6–23)
CO2: 26 meq/L (ref 19–32)
CREATININE: 0.66 mg/dL (ref 0.50–1.10)
Calcium: 9.1 mg/dL (ref 8.4–10.5)
Chloride: 104 mEq/L (ref 96–112)
GFR calc Af Amer: >90 mL/min (ref >90)
GFR calc non Af Amer: >90 mL/min (ref >90)
Glucose, Bld: 105 mg/dL — ABNORMAL HIGH (ref 70–99)
Potassium: 3.4 mEq/L — ABNORMAL LOW (ref 3.7–5.3)
SODIUM: 141 meq/L (ref 137–147)

## 2013-05-11 LAB — URINE CULTURE

## 2013-05-11 LAB — CBC
HCT: 35.6 % — ABNORMAL LOW (ref 36.0–46.0)
Hemoglobin: 12.5 g/dL (ref 12.0–15.0)
MCH: 32.1 pg (ref 26.0–34.0)
MCHC: 35.1 g/dL (ref 30.0–36.0)
MCV: 91.3 fL (ref 78.0–100.0)
PLATELETS: 238 10*3/uL (ref 150–400)
RBC: 3.9 MIL/uL (ref 3.87–5.11)
RDW: 11.7 % (ref 11.5–15.5)
WBC: 6.5 10*3/uL (ref 4.0–10.5)

## 2013-05-11 LAB — GLUCOSE, CAPILLARY
GLUCOSE-CAPILLARY: 106 mg/dL — AB (ref 70–99)
Glucose-Capillary: 87 mg/dL (ref 70–99)
Glucose-Capillary: 133 mg/dL — ABNORMAL HIGH (ref 70–99)
Glucose-Capillary: 110 mg/dL — ABNORMAL HIGH (ref 70–99)

## 2013-05-11 MED ORDER — POTASSIUM CHLORIDE CRYS ER 20 MEQ PO TBCR
40.0000 meq | EXTENDED_RELEASE_TABLET | Freq: Once | ORAL | Status: AC
  Administered 2013-05-11: 40 meq via ORAL
  Filled 2013-05-11: qty 2

## 2013-05-11 MED ORDER — CIPROFLOXACIN HCL 500 MG PO TABS
500.0000 mg | ORAL_TABLET | Freq: Two times a day (BID) | ORAL | Status: DC
  Administered 2013-05-11 – 2013-05-12 (×2): 500 mg via ORAL
  Filled 2013-05-11 (×4): qty 1

## 2013-05-11 MED ORDER — HYDROCODONE-ACETAMINOPHEN 5-325 MG PO TABS: 1.0000 | ORAL_TABLET | ORAL | Status: DC | PRN

## 2013-05-11 MED ORDER — CIPROFLOXACIN HCL 500 MG PO TABS: 500.0000 mg | ORAL_TABLET | Freq: Two times a day (BID) | ORAL | Status: DC

## 2013-05-11 NOTE — Discharge Instructions (Signed)
Hypokalemia Hypokalemia means that the amount of potassium in the blood is lower than normal.Potassium is a chemical, called an electrolyte, that helps regulate the amount of fluid in the body. It also stimulates muscle contraction and helps nerves function properly.Most of the body's potassium is inside of cells, and only a very small amount is in the blood. Because the amount in the blood is so small, minor changes can be life-threatening. CAUSES  Antibiotics.  Diarrhea or vomiting.  Using laxatives too much, which can cause diarrhea.  Chronic kidney disease.  Water pills (diuretics).  Eating disorders (bulimia).  Low magnesium level.  Sweating a lot. SIGNS AND SYMPTOMS  Weakness.  Constipation.  Fatigue.  Muscle cramps.  Mental confusion.  Skipped heartbeats or irregular heartbeat (palpitations).  Tingling or numbness. DIAGNOSIS  Your health care provider can diagnose hypokalemia with blood tests. In addition to checking your potassium level, your health care provider may also check other lab tests. TREATMENT Hypokalemia can be treated with potassium supplements taken by mouth or adjustments in your current medicines. If your potassium level is very low, you may need to get potassium through a vein (IV) and be monitored in the hospital. A diet high in potassium is also helpful. Foods high in potassium are:  Nuts, such as peanuts and pistachios.  Seeds, such as sunflower seeds and pumpkin seeds.  Peas, lentils, and lima beans.  Whole grain and bran cereals and breads.  Fresh fruit and vegetables, such as apricots, avocado, bananas, cantaloupe, kiwi, oranges, tomatoes, asparagus, and potatoes.  Orange and tomato juices.  Red meats.  Fruit yogurt. HOME CARE INSTRUCTIONS  Take all medicines as prescribed by your health care provider.  Maintain a healthy diet by including nutritious food, such as fruits, vegetables, nuts, whole grains, and lean meats.  If  you are taking a laxative, be sure to follow the directions on the label. SEEK MEDICAL CARE IF:  Your weakness gets worse.  You feel your heart pounding or racing.  You are vomiting or having diarrhea.  You are diabetic and having trouble keeping your blood glucose in the normal range. SEEK IMMEDIATE MEDICAL CARE IF:  You have chest pain, shortness of breath, or dizziness.  You are vomiting or having diarrhea for more than 2 days.  You faint. MAKE SURE YOU:   Understand these instructions.  Will watch your condition.  Will get help right away if you are not doing well or get worse. Document Released: 01/08/2005 Document Revised: 10/29/2012 Document Reviewed: 07/11/2012 Exit[employer] Patient Information 2014 [employer].  

## 2013-05-11 NOTE — Discharge Summary (Signed)
Physician Discharge Summary  [name] [name] [id number] DOB: May 01, 1970 DOA: 05/09/2013  PCP: Jeanann Lewandowsky, MD  Admit date: 05/09/2013 Discharge date: 05/11/2013  Recommendations for Outpatient Follow-up:  1. Pt will need to follow up with PCP in 2-3 weeks post discharge 2. Please obtain BMP to evaluate electrolytes and kidney function 3. Please also check CBC to evaluate Hg and Hct levels 4. Continue Cipro for 5 more days post discharge   Discharge Diagnoses:  Principal Problem:   Hypokalemia  Discharge Condition: Stable  Diet recommendation: Heart healthy diet discussed in details   Brief narrative:  43 y.o. female, with history of diabetes mellitus, hypertension and recent admission for hypokalemia presenting today again for hypokalemia . The patient was discharged recently from our hospital after a similar diagnosis. The patient continued to have diarrhea and she was still on hydrochlorothiazide. The day prior to this admission, her legs were giving out and her son suspected that it is due to hypokalemia although he insisted that he was giving her the potassium twice a day.   Principal Problem:  Hypokalemia  - unclear etiology, possibly from diarrhea  - Mg is WNL - potassium is slightly low and will therefore continue to supplement  Diarrhea  - stool GI panel, stool culture, O&P pending  Diabetes mellitus type II  - continue Metformin upon discharge  HTN  - stop HCTZ and continue Metoprolol  UTI  - suggestive based on UA  - continue Ciprofloxacin upon discharge for 5 more days upon discharge  MS - continue rehabilitation   Consultants:  None  Procedures/Studies:  Dg Abd Acute W/chest 05/09/2013 Negative abdominal radiographs. No acute cardiopulmonary disease.  Antibiotics:  Ciprofloxacin 4/20 --> 5 more days post discharge   Code Status: Full  Family Communication: Pt at bedside, called son on the number in the EPIC but all numbers are not working    Disposition Plan: Home when medically stable   Discharge Exam: Filed Vitals:   05/11/13 1033  BP: 142/92  Pulse: 75  Temp:   Resp:    Filed Vitals:   05/10/13 1500 05/10/13 2125 05/11/13 0727 05/11/13 1033  BP: 151/87 158/86 135/96 142/92  Pulse: 94 73 65 75  Temp: 98.3 F (36.8 C) 98.3 F (36.8 C) 98.3 F (36.8 C)   TempSrc: Oral Oral Oral   Resp: 22 20 18    Height:      Weight:      SpO2: 100% 100% 100%     General: Pt is alert, follows commands appropriately, not in acute distress Cardiovascular: Regular rate and rhythm, S1/S2 +, no murmurs, no rubs, no gallops Respiratory: Clear to auscultation bilaterally, no wheezing, no crackles, no rhonchi Abdominal: Soft, non tender, non distended, bowel sounds +, no guarding Extremities: no edema, no cyanosis, pulses palpable bilaterally DP and PT Neuro: Grossly nonfocal  Discharge Instructions     Medication List    STOP taking these medications       hydrochlorothiazide 25 MG tablet  Commonly known as:  HYDRODIURIL      TAKE these medications       aspirin 81 MG tablet  Take 1 tablet (81 mg total) by mouth daily.     ciprofloxacin 500 MG tablet  Commonly known as:  CIPRO  Take 1 tablet (500 mg total) by mouth 2 (two) times daily.     HYDROcodone-acetaminophen 5-325 MG per tablet  Commonly known as:  NORCO/VICODIN  Take 1-2 tablets by mouth every 4 (four) hours as needed  for moderate pain.     meclizine 25 MG tablet  Commonly known as:  ANTIVERT  Take 1 tablet (25 mg total) by mouth 3 (three) times daily as needed for dizziness or nausea.     metFORMIN 500 MG tablet  Commonly known as:  GLUCOPHAGE  Take 1 tablet (500 mg total) by mouth daily with breakfast.     metoprolol tartrate 25 MG tablet  Commonly known as:  LOPRESSOR  TAKE 1 TABLET TWICE A DAY     potassium chloride SA 20 MEQ tablet  Commonly known as:  K-DUR,KLOR-CON  Take 1 tablet (20 mEq total) by mouth daily.           Follow-up  Information   Schedule an appointment as soon as possible for a visit with Jeanann LewandowskyJEGEDE, [name], MD.   Specialty:  Internal Medicine   Contact information:   [address] [place] [address] [phone]        The results of significant diagnostics from this hospitalization (including imaging, microbiology, ancillary and laboratory) are listed below for reference.     Microbiology: No results found for this or any previous visit (from the past 240 hour(s)).   Labs: Basic Metabolic Panel:  Recent Labs Lab 05/09/13 2020 05/10/13 0450 05/10/13 1445 05/11/13 0400  NA 142 141  --  141  K 2.8* 3.4*  --  3.4*  CL 99 103  --  104  CO2 29 29  --  26  GLUCOSE 112* 111*  --  105*  BUN 16 12  --  11  CREATININE 0.71 0.60  --  0.66  CALCIUM 10.1 8.9  --  9.1  MG  --   --  2.0  --    Liver Function Tests:  Recent Labs Lab 05/09/13 2020  AST 16  ALT 13  ALKPHOS 83  BILITOT 1.4*  PROT 7.9  ALBUMIN 4.3    Recent Labs Lab 05/09/13 2020  LIPASE 49   CBC:  Recent Labs Lab 05/09/13 2020 05/11/13 0400  WBC 8.8 6.5  NEUTROABS 7.0  --   HGB 14.5 12.5  HCT 40.1 35.6*  MCV 90.3 91.3  PLT 253 238   Cardiac Enzymes:  Recent Labs Lab 05/09/13 2020  TROPONINI <0.30   CBG:  Recent Labs Lab 05/10/13 0756 05/10/13 1728 05/10/13 2206 05/11/13 0730 05/11/13 1129  GLUCAP 76 92 114* 106* 87     SIGNED: Time coordinating discharge: Over 30 minutes  Dorothea OgleIskra M Leyah Bocchino, MD  [facility] 05/11/2013, 1:29 PM Pager [phone]  If 7PM-7AM, please contact night-coverage [email or web address] Password [email or web address]

## 2013-05-11 NOTE — Evaluation (Signed)
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 but denies any tx or being aware of any compensatory strategies; will continue to follow in acute setting    Follow Up Recommendations Home health PT;Supervision/Assistance - 24 hour (could benefit from SNF if agreeable)    Equipment Recommendations  3in1 (PT)    Recommendations for Other Services       Precautions / Restrictions Precautions Precautions: Fall      Mobility  Bed Mobility Overal bed mobility: Needs Assistance Bed Mobility: Supine to Sit;Sit to Supine     Supine to sit: Min assist Sit to supine: Min assist      Transfers Overall transfer level: Needs assistance Equipment used: Rolling walker (2 wheeled) Transfers: Pharmacologist;Sit to/from Stand Sit to Stand: Min assist;Mod assist Stand pivot transfers: Min assist       General transfer comment: verbal cues for safe hand placement;   Ambulation/Gait Ambulation/Gait assistance: Mod assist Ambulation Distance (Feet): 4 Feet Assistive device: Rolling walker (2 wheeled) Gait Pattern/deviations: Drifts right/left;Narrow base of support;Ataxic     General Gait Details: pt very unsteady, she presents with ataxic type movement in LEs and trunk; she  c/o vertigo after amb a few steps and with return to supine;  Stairs            Wheelchair Mobility    Modified Rankin (Stroke Patients Only)       Balance Overall balance assessment: Needs assistance;History of Falls   Sitting balance-Leahy Scale: Fair     Standing balance support: During functional activity;Bilateral upper extremity supported Standing balance-Leahy Scale: Poor                               Pertinent Vitals/Pain Denies pain    Home Living Family/patient expects to be discharged to:: Private residence Living Arrangements: Children Available Help at Discharge: Family;Available 24 hours/day Type of Home: Apartment Home Access: Stairs to enter Entrance Stairs-Rails: Right;Left Entrance Stairs-Number of Steps: 14 Home Layout: One level Home Equipment: Walker - 2 wheels;Cane - single point      Prior Function Level of Independence: Independent with assistive device(s);Needs assistance   Gait / Transfers Assistance Needed: short distance amb in house with RW, wupervision-min assist when going outsid ethe home with RW  ADL's / Homemaking Assistance Needed: Assist with meals and housekeeping  Comments: pt reports 2 falls but can't relay the time frame of these     Hand Dominance        Extremity/Trunk Assessment   Upper Extremity Assessment: RUE deficits/detail;LUE deficits/detail RUE Deficits / Details: flexion/extension grossly 3+/5 bil         Lower Extremity Assessment: RLE deficits/detail;LLE deficits/detail RLE Deficits / Details: grossly 4/5 except  df 3+/5 bil       Communication   Communication: Other (comment) (dysarthria)  Cognition Arousal/Alertness: Awake/alert Behavior During Therapy: WFL for tasks assessed/performed Overall Cognitive Status: Within Functional Limits for tasks assessed                      General Comments      Exercises        Assessment/Plan    PT Assessment  Patient needs continued PT services  PT Diagnosis Difficulty walking   PT Problem List Decreased activity tolerance;Decreased balance;Decreased mobility;Decreased coordination  PT Treatment Interventions DME instruction;Gait training;Functional mobility training;Stair training;Therapeutic activities;Therapeutic exercise;Patient/family education;Balance training;Neuromuscular re-education   PT Goals (Current goals can be found in the  Care Plan section) Acute Rehab PT Goals Patient Stated Goal: wants to get back to her normal  PT Goal Formulation: With patient Time For Goal Achievement: 05/18/13 Potential to Achieve Goals: Good    Frequency Min 3X/week   Barriers to discharge        Co-evaluation               End of Session Equipment Utilized During Treatment: Gait belt Activity Tolerance: Other (comment) (vertigo) Patient left: in bed;with call bell/phone within reach;with bed alarm set           Time: 1200-1226 PT Time Calculation (min): 26 min   Charges:   PT Evaluation $Initial PT Evaluation Tier I: 1 Procedure PT Treatments $Therapeutic Activity: 23-37 mins   PT G Codes:          [name] [name] 05/11/2013, 12:34 PM

## 2013-05-11 NOTE — Progress Notes (Signed)
CSW & RNCM, Rosalita Chessman spoke with patient & son, Mena Goes at bedside re: discharge planning. Son became tearful while voicing his frustrations of getting care for his mother. CSW provided patient with support resources - [place] Society Navigator information, How to Apply for Disability Benefits & SCAT Application for transportation.   RNCM to arrange for home health & DME at discharge.   No further CSW needs identified - CSW signing off.   Lincoln Maxin, LCSW [facility] Clinical Social Worker cell #: [phone]

## 2013-05-12 DIAGNOSIS — E876 Hypokalemia: Secondary | ICD-10-CM

## 2013-05-12 LAB — CBC
HCT: 37.4 % (ref 36.0–46.0)
HEMOGLOBIN: 13 g/dL (ref 12.0–15.0)
MCH: 32 pg (ref 26.0–34.0)
MCHC: 34.8 g/dL (ref 30.0–36.0)
MCV: 92.1 fL (ref 78.0–100.0)
PLATELETS: 218 10*3/uL (ref 150–400)
RBC: 4.06 MIL/uL (ref 3.87–5.11)
RDW: 11.7 % (ref 11.5–15.5)
WBC: 5.7 10*3/uL (ref 4.0–10.5)

## 2013-05-12 LAB — BASIC METABOLIC PANEL
BUN: 14 mg/dL (ref 6–23)
CALCIUM: 9.8 mg/dL (ref 8.4–10.5)
CHLORIDE: 104 meq/L (ref 96–112)
CO2: 26 meq/L (ref 19–32)
Creatinine, Ser: 0.77 mg/dL (ref 0.50–1.10)
GFR calc Af Amer: >90 mL/min (ref >90)
GFR calc non Af Amer: >90 mL/min (ref >90)
GLUCOSE: 112 mg/dL — AB (ref 70–99)
POTASSIUM: 4.4 meq/L (ref 3.7–5.3)
Sodium: 141 mEq/L (ref 137–147)

## 2013-05-12 LAB — GLUCOSE, CAPILLARY
GLUCOSE-CAPILLARY: 83 mg/dL (ref 70–99)
Glucose-Capillary: 87 mg/dL (ref 70–99)

## 2013-05-12 NOTE — Progress Notes (Signed)
Asked to update son at bedside. I have tried to explain that pt is ready for discharge, explained that outpatient follow up is very important as well as neurology follow up. Son says that [facility] would not see pt as they owe a balance and unfortunately we have no way to help with that. I have also explained that SNF placement is an option but son refused. He has been very disrespectful and has been using derogative language, I did not feel safe to continue our conversation. Please let me know if I can be of any other assistance.  Debbora PrestoMAGICK-MYERS, ISKRA, MD  [facility] Pager [phone]  If 7PM-7AM, please contact night-coverage [email or web address] Password [email or web address]

## 2013-05-12 NOTE — Progress Notes (Signed)
Physical Therapy Treatment Patient Details Name: [name] [name] MRN: [id number] DOB: 11/21/1970 Today's Date: 05/12/2013    History of Present Illness 43 yo female adm 05/09/13 with hypokalemia; PMHx: MS, BPPV, HTN CVA/TIA, DM    PT Comments    Pt reports dizziness and nausea much improved since yesterday however continues to report feeling off balance during ambulation and reported dizziness upon returning back to supine.  Pt reports hx of vertigo and usually takes meds when she feels dizzy, also states dizziness usually occurs as spinning however did not state that today with mobility.  Pt with difficulty describing dizziness and no nystagmus observed with transferring.  RN notified of pt with hx of taking vertigo meds and would benefit from meds prior to d/c if d/c today.  Discussed d/c to SNF however pt reports she is waiting on her son to decide (she is uncertain if he feels able to assist her).   Follow Up Recommendations  Supervision/Assistance - 24 hour;SNF     Equipment Recommendations  3in1 (PT)    Recommendations for Other Services       Precautions / Restrictions Precautions Precautions: Fall    Mobility  Bed Mobility Overal bed mobility: Needs Assistance Bed Mobility: Supine to Sit;Sit to Supine     Supine to sit: Supervision;HOB elevated Sit to supine: Supervision;HOB elevated   General bed mobility comments: verbal cues, increased time to perform, denies dizziness upon sitting up however stated she was dizzy laying down, HOB elevated per pt preference  Transfers Overall transfer level: Needs assistance Equipment used: Rolling walker (2 wheeled) Transfers: Sit to/from Stand Sit to Stand: Min assist Stand pivot transfers: Min assist       General transfer comment: verbal cues for safe hand placement, assist to rise and steady as well as control descent  Ambulation/Gait Ambulation/Gait assistance: Min assist Ambulation Distance (Feet): 22 Feet  (total) Assistive device: Rolling walker (2 wheeled) Gait Pattern/deviations: Wide base of support;Step-through pattern;Decreased stride length Gait velocity: 0.09 ft/sec Gait velocity interpretation: <1.8 ft/sec, indicative of risk for recurrent falls General Gait Details: pt continues to present very unsteady, slightly ataxic movements in LEs observed, pt reports "walking like she's drunk" and states she feels unbalanced, recliner following for safety, 12' x1, 10'x1, seated rest break required   Stairs            Wheelchair Mobility    Modified Rankin (Stroke Patients Only)       Balance                                    Cognition Arousal/Alertness: Awake/alert Behavior During Therapy: WFL for tasks assessed/performed Overall Cognitive Status: Within Functional Limits for tasks assessed                      Exercises      General Comments        Pertinent Vitals/Pain No pain    Home Living                      Prior Function            PT Goals (current goals can now be found in the care plan section) Progress towards PT goals: Progressing toward goals    Frequency  Min 3X/week    PT Plan Current plan remains appropriate    Co-evaluation  End of Session Equipment Utilized During Treatment: Gait belt Activity Tolerance: Other (comment) (vertigo) Patient left: in bed;with call bell/phone within reach;with bed alarm set     Time: [id number] PT Time Calculation (min): 26 min  Charges:  $Gait Training: 23-37 mins                    G Codes:      Lynnell CatalanKatherine E Fawne Hughley 05/12/2013, 11:08 AM Zenovia JarredKati Gordan Grell, PT, DPT 05/12/2013 Pager: [phone]

## 2013-05-12 NOTE — Progress Notes (Addendum)
Met with pt to discuss [facility] services as ordered. She asked me to call her son [name] [name] to discuss this. Called and spoke with [name] [name]. He stated he thinks pt has another insurance plan in addition to the Medicaid and is looking for the card. I advised him to bring the card to admitting department and have them add it to the system. He wants to wait on making the final [facility] services decision until he knows what other insurance plan she may have.  [name] [name] also talked about wanting his mother to be seen by a neurologist while at the hospital because they will not see her as an outpatient due to an unpaid  bill that they cannot afford to pay. I asked hm if he has tried to make a payment arrangement with them so that she can be seen but he stated no. He wishes to discuss this with Dr Doyle Askew and I asked him to have the RN page her when he arrives today.  [name], RN BSN  [phone]  2:30pm I called [facility] to see about getting the pt an appt there despite the outstanding bill. I was informed that if the pt or son calls and makes a payment arrangement with them then they will be able to see the pt. I have called [name] [name] and let him know of this. He stated he had tried to do so in the past and it did not go well but he is willing to try again. I encouraged him to do so.

## 2013-05-12 NOTE — Progress Notes (Signed)
[facility]   [facility] is providing the following services: [facility]  If patient discharges after hours, please call [phone].   Renard Hamper 05/12/2013, 12:55 PM

## 2013-05-12 NOTE — Progress Notes (Signed)
Pt seen and examined at bedside, CBC and BMP pending. If its stable will plan on d/c today. Please see ealier d/c summary 05/11/2013. No changes needed.  Debbora Presto, MD  [facility] Pager [phone]  If 7PM-7AM, please contact night-coverage [email or web address] Password [email or web address]

## 2013-05-12 NOTE — Care Management Note (Signed)
    Page 1 of 1   05/12/2013     2:48:09 PM CARE MANAGEMENT NOTE 05/12/2013  Patient:  SABREA, HALLUM   Account Number:  [id number]  Date Initiated:  05/10/2013  Documentation initiated by:  [facility]  Subjective/Objective Assessment:   hypokalemia, DM, MS     Action/Plan:   lives at home with son   Anticipated DC Date:  05/12/2013   Anticipated DC Plan:  HOME W HOME HEALTH SERVICES      DC Planning Services  CM consult      Choice offered to / List presented to:  C-4 Adult Children   DME arranged  [facility]      DME agency  [facility]     [facility] arranged  HH-1 RN  HH-4 NURSE'S AIDE      HH agency  [facility]   Status of service:  In process, will continue to follow Medicare Important Message given?  NO (If response is "NO", the following Medicare IM given date fields will be blank) Date Medicare IM given:   Date Additional Medicare IM given:    Discharge Disposition:  HOME W HOME HEALTH SERVICES  Per UR Regulation:  Reviewed for med. necessity/level of care/duration of stay  If discussed at Long Length of Stay Meetings, dates discussed:    Comments:  05/12/13 Algernon Huxley RN BSN [phone] Discussed d/c planning with son. He wants pt to have only HHRN and aide as home. He defers HHPT at this time since pt is only limited to 4 visits per year. Referral made to [facility] with [facility].

## 2013-05-15 ENCOUNTER — Telehealth: Payer: Self-pay

## 2013-05-15 ENCOUNTER — Telehealth: Payer: Self-pay | Admitting: Internal Medicine

## 2013-05-15 NOTE — Telephone Encounter (Signed)
Sherry from [facility] left message on machine about patient Needs verbal consent for potassium and to do A1c Was not available Left message on machine to return our call Cordelia Pen #  [phone]

## 2013-05-15 NOTE — Telephone Encounter (Signed)
Nurse aide calling regarding order to Potassium draw as well as A1C result.  Nurse was informed that pt has not been to clinic since June 2014 and has No Showed appts and she says she will be speaking to pt regarding this.  Please f/u with nurse.

## 2013-05-25 ENCOUNTER — Telehealth: Payer: Self-pay | Admitting: *Deleted

## 2013-05-25 DIAGNOSIS — E119 Type 2 diabetes mellitus without complications: Secondary | ICD-10-CM

## 2013-05-25 DIAGNOSIS — I10 Essential (primary) hypertension: Secondary | ICD-10-CM

## 2013-05-25 MED ORDER — METOPROLOL TARTRATE 25 MG PO TABS: ORAL_TABLET | ORAL | Status: DC

## 2013-05-25 MED ORDER — AMLODIPINE BESYLATE 10 MG PO TABS: 10.0000 mg | ORAL_TABLET | Freq: Every day | ORAL | Status: DC

## 2013-05-25 MED ORDER — METFORMIN HCL 500 MG PO TABS: 500.0000 mg | ORAL_TABLET | Freq: Every day | ORAL | Status: DC

## 2013-05-25 NOTE — Telephone Encounter (Signed)
Patient's son called in regards to mom blood pressure medication Hydrochlorothiazide. Patient's son reports that when she takes the medication she is not able to walk around and get to the bathroom on time. Consulted with Dr. Hyman Hopes who prescribed Norvasc 10mg  once daily along with Lopressor. Informed patient's son to stop giving mom Hydrochlorothiazide. Patient son also asked for a refill on Metformin. Informed patient's son we will only refill these three medications for one month and his mom needs a follow up appointment with Dr. Hyman Hopes. Patient's son verbalized understanding. Patient's son transferred to the appointment line to make follow up appointment. Reather Laurence, RN

## 2013-06-01 ENCOUNTER — Telehealth: Payer: Self-pay | Admitting: Internal Medicine

## 2013-06-01 NOTE — Telephone Encounter (Signed)
Carlena Sax from [facility] faxed over forms on 05/29/13 for recert for bed and for recert for wheel chair. Calling to confirm forms were received, would like to know if any more info needed for forms to be filled out. Please f/u with Carlena Sax.

## 2013-06-02 ENCOUNTER — Inpatient Hospital Stay (HOSPITAL_COMMUNITY)
Admission: EM | Admit: 2013-06-02 | Discharge: 2013-06-08 | DRG: 871 | Disposition: A | Discharge status: Home Health | Payer: Medicare Other | Attending: Internal Medicine | Admitting: Internal Medicine

## 2013-06-02 ENCOUNTER — Emergency Department (HOSPITAL_COMMUNITY): Payer: Medicare Other

## 2013-06-02 ENCOUNTER — Encounter (HOSPITAL_COMMUNITY): Payer: Self-pay | Admitting: Emergency Medicine

## 2013-06-02 DIAGNOSIS — E872 Acidosis, unspecified: Secondary | ICD-10-CM

## 2013-06-02 DIAGNOSIS — A419 Sepsis, unspecified organism: Principal | ICD-10-CM

## 2013-06-02 DIAGNOSIS — Z8673 Personal history of transient ischemic attack (TIA), and cerebral infarction without residual deficits: Secondary | ICD-10-CM

## 2013-06-02 DIAGNOSIS — G5600 Carpal tunnel syndrome, unspecified upper limb: Secondary | ICD-10-CM

## 2013-06-02 DIAGNOSIS — G35A Relapsing-remitting multiple sclerosis: Secondary | ICD-10-CM

## 2013-06-02 DIAGNOSIS — G35D Multiple sclerosis, unspecified: Secondary | ICD-10-CM | POA: Diagnosis present

## 2013-06-02 DIAGNOSIS — E876 Hypokalemia: Secondary | ICD-10-CM

## 2013-06-02 DIAGNOSIS — Z7982 Long term (current) use of aspirin: Secondary | ICD-10-CM

## 2013-06-02 DIAGNOSIS — Z87891 Personal history of nicotine dependence: Secondary | ICD-10-CM

## 2013-06-02 DIAGNOSIS — F329 Major depressive disorder, single episode, unspecified: Secondary | ICD-10-CM

## 2013-06-02 DIAGNOSIS — E785 Hyperlipidemia, unspecified: Secondary | ICD-10-CM | POA: Diagnosis present

## 2013-06-02 DIAGNOSIS — I10 Essential (primary) hypertension: Secondary | ICD-10-CM | POA: Diagnosis present

## 2013-06-02 DIAGNOSIS — R4182 Altered mental status, unspecified: Secondary | ICD-10-CM

## 2013-06-02 DIAGNOSIS — G35 Multiple sclerosis: Secondary | ICD-10-CM | POA: Diagnosis present

## 2013-06-02 DIAGNOSIS — E119 Type 2 diabetes mellitus without complications: Secondary | ICD-10-CM | POA: Diagnosis present

## 2013-06-02 DIAGNOSIS — F32A Depression, unspecified: Secondary | ICD-10-CM

## 2013-06-02 DIAGNOSIS — K5900 Constipation, unspecified: Secondary | ICD-10-CM

## 2013-06-02 DIAGNOSIS — I498 Other specified cardiac arrhythmias: Secondary | ICD-10-CM | POA: Diagnosis present

## 2013-06-02 DIAGNOSIS — J690 Pneumonitis due to inhalation of food and vomit: Secondary | ICD-10-CM | POA: Diagnosis present

## 2013-06-02 DIAGNOSIS — J189 Pneumonia, unspecified organism: Secondary | ICD-10-CM

## 2013-06-02 DIAGNOSIS — Z79899 Other long term (current) drug therapy: Secondary | ICD-10-CM

## 2013-06-02 LAB — CBC WITH DIFFERENTIAL/PLATELET
Basophils Absolute: 0 10*3/uL (ref 0.0–0.1)
Basophils Relative: 0 % (ref 0–1)
EOS ABS: 0 10*3/uL (ref 0.0–0.7)
EOS PCT: 0 % (ref 0–5)
HCT: 42.4 % (ref 36.0–46.0)
Hemoglobin: 14.9 g/dL (ref 12.0–15.0)
LYMPHS PCT: 3 % — AB (ref 12–46)
Lymphs Abs: 0.3 10*3/uL — ABNORMAL LOW (ref 0.7–4.0)
MCH: 32.6 pg (ref 26.0–34.0)
MCHC: 35.1 g/dL (ref 30.0–36.0)
MCV: 92.8 fL (ref 78.0–100.0)
MONOS PCT: 8 % (ref 3–12)
Monocytes Absolute: 0.9 10*3/uL (ref 0.1–1.0)
Neutro Abs: 9.8 10*3/uL — ABNORMAL HIGH (ref 1.7–7.7)
Neutrophils Relative %: 89 % — ABNORMAL HIGH (ref 43–77)
PLATELETS: 246 10*3/uL (ref 150–400)
RBC: 4.57 MIL/uL (ref 3.87–5.11)
RDW: 11.7 % (ref 11.5–15.5)
WBC: 11 10*3/uL — AB (ref 4.0–10.5)

## 2013-06-02 LAB — I-STAT ARTERIAL BLOOD GAS, ED
ACID-BASE EXCESS: 3 mmol/L — AB (ref 0.0–2.0)
Bicarbonate: 25.4 mEq/L — ABNORMAL HIGH (ref 20.0–24.0)
O2 Saturation: 97 %
PCO2 ART: 33 mmHg — AB (ref 35.0–45.0)
PH ART: 7.496 — AB (ref 7.350–7.450)
PO2 ART: 78 mmHg — AB (ref 80.0–100.0)
Patient temperature: 98.6
TCO2: 26 mmol/L (ref 0–100)

## 2013-06-02 LAB — I-STAT TROPONIN, ED: Troponin i, poc: 0.01 ng/mL (ref 0.00–0.08)

## 2013-06-02 LAB — PROTIME-INR
INR: 1.02 (ref 0.00–1.49)
PROTHROMBIN TIME: 13.2 s (ref 11.6–15.2)

## 2013-06-02 LAB — I-STAT CG4 LACTIC ACID, ED: LACTIC ACID, VENOUS: 2.96 mmol/L — AB (ref 0.5–2.2)

## 2013-06-02 MED ORDER — PIPERACILLIN-TAZOBACTAM 3.375 G IVPB 30 MIN
3.3750 g | Freq: Once | INTRAVENOUS | Status: AC
  Administered 2013-06-02: 3.375 g via INTRAVENOUS
  Filled 2013-06-02: qty 50

## 2013-06-02 MED ORDER — ACETAMINOPHEN 650 MG RE SUPP
650.0000 mg | Freq: Once | RECTAL | Status: AC
  Administered 2013-06-02: 650 mg via RECTAL
  Filled 2013-06-02: qty 1

## 2013-06-02 MED ORDER — VANCOMYCIN HCL 10 G IV SOLR
1250.0000 mg | Freq: Once | INTRAVENOUS | Status: AC
  Administered 2013-06-03: 1250 mg via INTRAVENOUS
  Filled 2013-06-02: qty 1250

## 2013-06-02 NOTE — ED Notes (Signed)
Son reports that she had fallen earlier today.

## 2013-06-02 NOTE — ED Notes (Addendum)
Notified MD and Nurse of elevated istat lactic acid.

## 2013-06-02 NOTE — ED Notes (Signed)
Resident at bedside.  

## 2013-06-02 NOTE — ED Provider Notes (Signed)
CSN: [id number]     Arrival date & time 06/02/13  2117 History   First MD Initiated Contact with Patient 06/02/13 2230     Chief Complaint  Patient presents with  . Altered Mental Status  . Emesis     (Consider location/radiation/quality/duration/timing/severity/associated sxs/prior Treatment) HPI Comments: In addition to what is listed below.  Patient is a [name] with PMH as below and relevant for MS, TIA, HTN, prior UTIs, and DM who presents with complaints of cough and AMS.  Patient has no complaints upon arrival but confused and thus history is obtained primarily by son.  He reports that patient has had cough productive of white sputum for the past few days.  Today he noticed that patient was confused.  He left house and upon returning found patient on floor.  He helped patient to bed and she reported that she fell while trying to go to bathroom.  Son reports going back into bedroom some time later and found patient coughing and what he describes as choking on vomit.  Her confusion worsened and thus she was brought to ED.    Patient is a 43 y.o. female presenting with general illness. The history is provided by the patient, a relative and medical records. No language interpreter was used.  Illness Severity:  Severe Onset quality:  Gradual Timing:  Constant Progression:  Worsening Chronicity:  New Associated symptoms: cough   Associated symptoms: no abdominal pain, no chest pain, no fever, no headaches and no shortness of breath     Past Medical History  Diagnosis Date  . Multiple sclerosis   . HYPERTENSION 09/30/2006  . DIABETES MELLITUS, TYPE II 09/30/2006  . TIA (transient ischemic attack)   . TOBACCO USER 09/30/2006    Qualifier: Diagnosis of  By: Delrae AlfredMulberry MD, [name]    . Multiple sclerosis, relapsing-remitting 05/29/2012  . DYSLIPIDEMIA 09/30/2006    Qualifier: Diagnosis of  By: Delrae AlfredMulberry MD, [name]    . DIABETES MELLITUS, TYPE II 09/30/2006    Qualifier: Diagnosis of   By: Delrae AlfredMulberry MD, [name]    . BENIGN POSITIONAL VERTIGO 01/27/2010    Qualifier: Diagnosis of  By: Delrae AlfredMulberry MD, [name]    . Stroke    History reviewed. No pertinent past surgical history. Family History  Problem Relation Age of Onset  . Hypertension Mother   . Kidney failure Mother   . Hypertension Father   . Gout Father   . Hypertension Sister   . Diabetes Sister   . Hypertension Brother    History  Substance Use Topics  . Smoking status: Former Smoker -- 0.25 packs/day  . Smokeless tobacco: Not on file  . Alcohol Use: No   OB History   Grav Para Term Preterm Abortions TAB SAB Ect Mult Living                 Review of Systems  Constitutional: Negative for fever.  Respiratory: Positive for cough. Negative for shortness of breath.   Cardiovascular: Negative for chest pain.  Gastrointestinal: Negative for abdominal pain.  Neurological: Negative for headaches.  All other systems reviewed and are negative.     Allergies  Review of patient's allergies indicates no known allergies.  Home Medications   Prior to Admission medications   Medication Sig Start Date End Date Taking? Authorizing Provider  amLODipine (NORVASC) 10 MG tablet Take 1 tablet (10 mg total) by mouth daily. 05/25/13  Yes Jeanann Lewandowskylugbemiga [name], MD  aspirin 81 MG tablet Take 1 tablet (  81 mg total) by mouth daily. 09/19/12  Yes Dorothea Ogle, MD  HYDROcodone-acetaminophen (NORCO/VICODIN) 5-325 MG per tablet Take 1-2 tablets by mouth every 4 (four) hours as needed for moderate pain. 05/11/13  Yes Dorothea Ogle, MD  meclizine (ANTIVERT) 25 MG tablet Take 1 tablet (25 mg total) by mouth 3 (three) times daily as needed for dizziness or nausea. 09/19/12  Yes Dorothea Ogle, MD  metFORMIN (GLUCOPHAGE) 500 MG tablet Take 1 tablet (500 mg total) by mouth daily with breakfast. 05/25/13  Yes Jeanann Lewandowsky, MD  metoprolol tartrate (LOPRESSOR) 25 MG tablet Take 25 mg by mouth 2 (two) times daily.   Yes Historical Provider, MD   potassium chloride SA (K-DUR,KLOR-CON) 20 MEQ tablet Take 1 tablet (20 mEq total) by mouth daily. 04/30/13  Yes Olivia Mackie, MD   BP 128/76  Pulse 102  Temp(Src) 100.8 F (38.2 C) (Oral)  Resp 20  Ht 5\' 2"  (1.575 m)  Wt 127 lb (57.607 kg)  BMI 23.22 kg/m2  SpO2 100% Physical Exam  Nursing note and vitals reviewed. Constitutional: She appears well-developed and well-nourished. No distress.  HENT:  Head: Normocephalic and atraumatic.  Right Ear: External ear normal.  Left Ear: External ear normal.  Mouth/Throat: Oropharynx is clear and moist.  Eyes: Conjunctivae are normal. Pupils are equal, round, and reactive to light.  Neck: Normal range of motion. Neck supple. No muscular tenderness present. No rigidity. Normal range of motion present. No Brudzinski's sign and no Kernig's sign noted. No thyromegaly present.  Pulmonary/Chest: Effort normal and breath sounds normal.  Diffuse rhonchi  Abdominal: Soft. Bowel sounds are normal. She exhibits no distension. There is no tenderness.  Musculoskeletal: Normal range of motion. She exhibits no edema and no tenderness.  Neurological: She is alert. She has normal strength and normal reflexes. No cranial nerve deficit or sensory deficit. Coordination normal. GCS eye subscore is 4. GCS verbal subscore is 4. GCS motor subscore is 6.  Oriented only to person and place  Skin: Skin is warm and dry.  Psychiatric: She has a normal mood and affect.    ED Course  Procedures (including critical care time) Labs Review Labs Reviewed  CBC WITH DIFFERENTIAL - Abnormal; Notable for the following:    WBC 11.0 (*)    Neutrophils Relative % 89 (*)    Neutro Abs 9.8 (*)    Lymphocytes Relative 3 (*)    Lymphs Abs 0.3 (*)    All other components within normal limits  COMPREHENSIVE METABOLIC PANEL - Abnormal; Notable for the following:    Sodium 151 (*)    Potassium 2.4 (*)    Glucose, Bld 109 (*)    Total Bilirubin 2.5 (*)    All other components  within normal limits  URINALYSIS, ROUTINE W REFLEX MICROSCOPIC - Abnormal; Notable for the following:    Hgb urine dipstick SMALL (*)    Ketones, ur 15 (*)    All other components within normal limits  I-STAT CG4 LACTIC ACID, ED - Abnormal; Notable for the following:    Lactic Acid, Venous 2.96 (*)    All other components within normal limits  I-STAT ARTERIAL BLOOD GAS, ED - Abnormal; Notable for the following:    pH, Arterial 7.496 (*)    pCO2 arterial 33.0 (*)    pO2, Arterial 78.0 (*)    Bicarbonate 25.4 (*)    Acid-Base Excess 3.0 (*)    All other components within normal limits  CULTURE, BLOOD (ROUTINE  X 2)  CULTURE, BLOOD (ROUTINE X 2)  URINE CULTURE  GRAM STAIN  CULTURE, EXPECTORATED SPUTUM-ASSESSMENT  PROTIME-INR  URINE MICROSCOPIC-ADD ON  CBC  BASIC METABOLIC PANEL  I-STAT TROPOININ, ED    Imaging Review Ct Head Wo Contrast  06/03/2013   CLINICAL DATA:  Altered mental status. History of multiple sclerosis.  EXAM: CT HEAD WITHOUT CONTRAST  TECHNIQUE: Contiguous axial images were obtained from the base of the skull through the vertex without intravenous contrast.  COMPARISON:  MR HEAD WO/W CM dated 09/06/2012; CT HEAD W/O CM dated 09/06/2012  FINDINGS: Mild ventriculomegaly, likely on the basis of global parenchymal brain volume loss as there is overall commensurate enlargement of cerebral sulci and cerebellar folia, advanced for age though, stable from prior MRI. Moderate to severe confluent white matter hypodensities are similar to prior examination without midline shift, mass effect, intraparenchymal hemorrhage nor acute large vascular territory infarct. Right basal ganglia perivascular space.  No abnormal extra-axial fluid collections. Basal cisterns are patent.  Visualized paranasal sinuses and mastoid air cells are well aerated. No skull fracture.  IMPRESSION: No acute intracranial process.  Moderate to severe white matter changes similar to prior examination compatible with  patient's history of demyelination. Mild global parenchymal brain volume loss for age.   Electronically Signed   By: Awilda Metro   On: 06/03/2013 00:39   Dg Chest Port 1 View  06/02/2013   CLINICAL DATA:  Emesis, altered mental status  EXAM: PORTABLE CHEST - 1 VIEW  COMPARISON:  05/09/2013  FINDINGS: Cardiac shadow is within normal limits. The lungs are well aerated bilaterally. Increased density is noted in the bases bilaterally consistent with bibasilar infiltrates. This slightly more prominent on the left than the right. No other focal abnormality is seen.  IMPRESSION: Bibasilar infiltrates left greater than right.   Electronically Signed   By: Alcide Clever M.D.   On: 06/02/2013 23:13     EKG Interpretation   Date/Time:  Tuesday Jun 02 2013 21:47:24 EDT Ventricular Rate:  104 PR Interval:  60 QRS Duration: 76 QT Interval:  336 QTC Calculation: 442 R Axis:   49 Text Interpretation:  Sinus tachycardia Ventricular premature complex  Right atrial enlargement Repol abnrm suggests ischemia, diffuse leads  Since last tracing Rate faster lateral ST changes are worse than previous  Confirmed by Bernette Mayers  MD, Leonette Most [id number]) on 06/02/2013 10:34:27 PM      MDM   Final diagnoses:  Altered mental status  Healthcare-associated pneumonia  Lactic acidosis  Hypokalemia    Patient presents to the ED with complaints of cough and AMS.  Upon arrival, HR noted to be >90 and patient had fever up to 100.8.  PE as above and remarkable for diffuse rhonchi on lung exam and patient being oriented to only person and place which is a change per son.  Code sepsis initiated and given recent hospitalization she was given vanc/zosyn for hospital acquired infections.  Revew of workup remarkable for bilateral L>R basilar infiltrates, lactic acidosis, and hypokalemia.  She was given IVF, K replacement, and hospitalist consulted for admission in the setting of PNA with delirium.  With no signs of meningismus,  source for fever/AMS identified with PNA, and overall patient appears well it was not felt that LP was need emergently.  Patient to be admitted to floor for further evaluation and treatment of PNA.      Johnney Ou, MD 06/03/13 [phone]

## 2013-06-02 NOTE — ED Notes (Signed)
Patient presents from home.  EMS reports that the son told them that she was "not acting right".  EMS found her sitting on a BSC slumped over with vomit on her clothing.  When they moved her she started moaning with pain.

## 2013-06-02 NOTE — ED Notes (Signed)
PER MD - NO FLUIDS

## 2013-06-03 ENCOUNTER — Ambulatory Visit (HOSPITAL_COMMUNITY): Payer: Medicare Other

## 2013-06-03 ENCOUNTER — Emergency Department (HOSPITAL_COMMUNITY): Payer: Medicare Other

## 2013-06-03 DIAGNOSIS — E119 Type 2 diabetes mellitus without complications: Secondary | ICD-10-CM

## 2013-06-03 DIAGNOSIS — G35 Multiple sclerosis: Secondary | ICD-10-CM

## 2013-06-03 DIAGNOSIS — J690 Pneumonitis due to inhalation of food and vomit: Secondary | ICD-10-CM | POA: Diagnosis present

## 2013-06-03 LAB — URINALYSIS, ROUTINE W REFLEX MICROSCOPIC
BILIRUBIN URINE: NEGATIVE
Glucose, UA: NEGATIVE mg/dL
Ketones, ur: 15 mg/dL — AB
Leukocytes, UA: NEGATIVE
NITRITE: NEGATIVE
PROTEIN: NEGATIVE mg/dL
Specific Gravity, Urine: 1.016 (ref 1.005–1.030)
UROBILINOGEN UA: 1 mg/dL (ref 0.0–1.0)
pH: 7 (ref 5.0–8.0)

## 2013-06-03 LAB — BASIC METABOLIC PANEL
BUN: 10 mg/dL (ref 6–23)
CALCIUM: 9.1 mg/dL (ref 8.4–10.5)
CO2: 24 mEq/L (ref 19–32)
CREATININE: 0.57 mg/dL (ref 0.50–1.10)
Chloride: 106 mEq/L (ref 96–112)
GFR calc Af Amer: >90 mL/min (ref >90)
GLUCOSE: 157 mg/dL — AB (ref 70–99)
POTASSIUM: 2.4 meq/L — AB (ref 3.7–5.3)
Sodium: 145 mEq/L (ref 137–147)

## 2013-06-03 LAB — COMPREHENSIVE METABOLIC PANEL
ALT: 11 U/L (ref 0–35)
AST: 18 U/L (ref 0–37)
Albumin: 4.6 g/dL (ref 3.5–5.2)
Alkaline Phosphatase: 69 U/L (ref 39–117)
BUN: 11 mg/dL (ref 6–23)
CALCIUM: 9.9 mg/dL (ref 8.4–10.5)
CO2: 25 meq/L (ref 19–32)
Chloride: 108 mEq/L (ref 96–112)
Creatinine, Ser: 0.61 mg/dL (ref 0.50–1.10)
GLUCOSE: 109 mg/dL — AB (ref 70–99)
Potassium: 2.4 mEq/L — CL (ref 3.7–5.3)
SODIUM: 151 meq/L — AB (ref 137–147)
Total Bilirubin: 2.5 mg/dL — ABNORMAL HIGH (ref 0.3–1.2)
Total Protein: 8.1 g/dL (ref 6.0–8.3)

## 2013-06-03 LAB — CBC
HEMATOCRIT: 36.1 % (ref 36.0–46.0)
Hemoglobin: 12.5 g/dL (ref 12.0–15.0)
MCH: 31.7 pg (ref 26.0–34.0)
MCHC: 34.6 g/dL (ref 30.0–36.0)
MCV: 91.6 fL (ref 78.0–100.0)
Platelets: 221 10*3/uL (ref 150–400)
RBC: 3.94 MIL/uL (ref 3.87–5.11)
RDW: 11.7 % (ref 11.5–15.5)
WBC: 13.1 10*3/uL — ABNORMAL HIGH (ref 4.0–10.5)

## 2013-06-03 LAB — GLUCOSE, CAPILLARY
GLUCOSE-CAPILLARY: 76 mg/dL (ref 70–99)
Glucose-Capillary: 123 mg/dL — ABNORMAL HIGH (ref 70–99)
Glucose-Capillary: 98 mg/dL (ref 70–99)
Glucose-Capillary: 212 mg/dL — ABNORMAL HIGH (ref 70–99)
Glucose-Capillary: 93 mg/dL (ref 70–99)

## 2013-06-03 LAB — URINE MICROSCOPIC-ADD ON

## 2013-06-03 LAB — MAGNESIUM: MAGNESIUM: 1.9 mg/dL (ref 1.5–2.5)

## 2013-06-03 MED ORDER — MECLIZINE HCL 25 MG PO TABS
25.0000 mg | ORAL_TABLET | Freq: Three times a day (TID) | ORAL | Status: DC | PRN
  Administered 2013-06-04 – 2013-06-07 (×4): 25 mg via ORAL
  Filled 2013-06-03 (×5): qty 1

## 2013-06-03 MED ORDER — POTASSIUM CHLORIDE 10 MEQ/100ML IV SOLN
10.0000 meq | INTRAVENOUS | Status: AC
  Administered 2013-06-03 (×6): 10 meq via INTRAVENOUS
  Filled 2013-06-03 (×6): qty 100

## 2013-06-03 MED ORDER — METOPROLOL TARTRATE 25 MG PO TABS
25.0000 mg | ORAL_TABLET | Freq: Two times a day (BID) | ORAL | Status: DC
  Administered 2013-06-03 – 2013-06-08 (×12): 25 mg via ORAL
  Filled 2013-06-03 (×12): qty 1

## 2013-06-03 MED ORDER — DIAZEPAM 2 MG PO TABS
2.0000 mg | ORAL_TABLET | Freq: Once | ORAL | Status: AC
  Administered 2013-06-04: 2 mg via ORAL
  Filled 2013-06-03: qty 1

## 2013-06-03 MED ORDER — INSULIN ASPART 100 UNIT/ML ~~LOC~~ SOLN
0.0000 [IU] | Freq: Every day | SUBCUTANEOUS | Status: DC
  Administered 2013-06-05: 4 [IU] via SUBCUTANEOUS

## 2013-06-03 MED ORDER — POTASSIUM CHLORIDE 10 MEQ/100ML IV SOLN
10.0000 meq | INTRAVENOUS | Status: AC
  Administered 2013-06-03: 10 meq via INTRAVENOUS
  Filled 2013-06-03: qty 100

## 2013-06-03 MED ORDER — POTASSIUM CHLORIDE CRYS ER 20 MEQ PO TBCR
20.0000 meq | EXTENDED_RELEASE_TABLET | Freq: Every day | ORAL | Status: DC
  Administered 2013-06-03 – 2013-06-08 (×6): 20 meq via ORAL
  Filled 2013-06-03 (×6): qty 1

## 2013-06-03 MED ORDER — HEPARIN SODIUM (PORCINE) 5000 UNIT/ML IJ SOLN
INTRAMUSCULAR | Status: AC
  Filled 2013-06-03: qty 1

## 2013-06-03 MED ORDER — METOPROLOL TARTRATE 25 MG PO TABS: 25.0000 mg | ORAL_TABLET | Freq: Two times a day (BID) | ORAL | Status: DC

## 2013-06-03 MED ORDER — AMLODIPINE BESYLATE 10 MG PO TABS
10.0000 mg | ORAL_TABLET | Freq: Every day | ORAL | Status: DC
  Administered 2013-06-03 – 2013-06-08 (×6): 10 mg via ORAL
  Filled 2013-06-03 (×6): qty 1

## 2013-06-03 MED ORDER — HYDROCODONE-ACETAMINOPHEN 5-325 MG PO TABS: 1.0000 | ORAL_TABLET | ORAL | Status: DC | PRN

## 2013-06-03 MED ORDER — INSULIN ASPART 100 UNIT/ML ~~LOC~~ SOLN: 0.0000 [IU] | Freq: Three times a day (TID) | SUBCUTANEOUS | Status: DC

## 2013-06-03 MED ORDER — ASPIRIN 81 MG PO CHEW
81.0000 mg | CHEWABLE_TABLET | Freq: Every day | ORAL | Status: DC
  Administered 2013-06-03 – 2013-06-08 (×6): 81 mg via ORAL
  Filled 2013-06-03 (×5): qty 1

## 2013-06-03 MED ORDER — SODIUM CHLORIDE 0.9 % IV SOLN
1.5000 g | Freq: Four times a day (QID) | INTRAVENOUS | Status: DC
  Administered 2013-06-03 – 2013-06-07 (×20): 1.5 g via INTRAVENOUS
  Filled 2013-06-03 (×23): qty 1.5

## 2013-06-03 MED ORDER — HEPARIN SODIUM (PORCINE) 5000 UNIT/ML IJ SOLN
5000.0000 [IU] | Freq: Three times a day (TID) | INTRAMUSCULAR | Status: DC
  Administered 2013-06-03 – 2013-06-08 (×16): 5000 [IU] via SUBCUTANEOUS
  Filled 2013-06-03 (×19): qty 1

## 2013-06-03 MED ORDER — INSULIN ASPART 100 UNIT/ML ~~LOC~~ SOLN
0.0000 [IU] | Freq: Three times a day (TID) | SUBCUTANEOUS | Status: DC
  Administered 2013-06-03: 5 [IU] via SUBCUTANEOUS
  Administered 2013-06-04: 2 [IU] via SUBCUTANEOUS
  Administered 2013-06-05: 3 [IU] via SUBCUTANEOUS
  Administered 2013-06-06 (×2): 2 [IU] via SUBCUTANEOUS
  Administered 2013-06-06 – 2013-06-08 (×5): 3 [IU] via SUBCUTANEOUS
  Administered 2013-06-08: 2 [IU] via SUBCUTANEOUS

## 2013-06-03 NOTE — ED Notes (Signed)
Patient transported to CT 

## 2013-06-03 NOTE — Progress Notes (Signed)
UR completed 

## 2013-06-03 NOTE — ED Notes (Signed)
Paged Ct that patient is now ready for CT

## 2013-06-03 NOTE — ED Provider Notes (Signed)
I saw and evaluated the patient, reviewed the resident's note and I agree with the findings and plan.   EKG Interpretation   Date/Time:  Tuesday Jun 02 2013 21:47:24 EDT Ventricular Rate:  104 PR Interval:  60 QRS Duration: 76 QT Interval:  336 QTC Calculation: 442 R Axis:   49 Text Interpretation:  Sinus tachycardia Ventricular premature complex  Right atrial enlargement Repol abnrm suggests ischemia, diffuse leads  Since last tracing Rate faster lateral ST changes are worse than previous  Confirmed by [facility]  MD, Leonette MostHARLES [phone]) on 06/02/2013 10:34:27 PM      Pt with AMS, fever, difficult history. Sepsis protocol initiated for fever and tachycardia. PNA likely source. Admit for further eval.   Mane Consolo B. Bernette MayersSheldon, MD 06/03/13 1247

## 2013-06-03 NOTE — H&P (Addendum)
[facility] History and Physical  Georgeanna LeaDeirdre K Kurtenbach ZOX:[id number] DOB: 01/08/1971 DOA: 06/02/2013  Referring physician: EDP PCP: Jeanann LewandowskyJEGEDE, [name], MD   Chief Complaint: AMS   HPI: [name] Dina RichK [name] [name] is a 43 y.o. female who is brought in to the ED after an episode of unresponsiveness and aspiration.  Patient who has a history of MS, was fine this morning when son left her, when he came back she was slumped over the commode and unresponsive for at least 1 min, gurgling on vomit, so he called EMS.  She had no cough earlier today, was running a temperature of 99 per son when home nursing took it, but [facility].  She had no complaints earlier today and has no complaints at this time though her mental status is still noted to be off per son though much improved from her unresponsive episode and close to baseline overall.  Review of Systems: Systems reviewed.  As above, otherwise negative  Past Medical History  Diagnosis Date  . Multiple sclerosis   . HYPERTENSION 09/30/2006  . DIABETES MELLITUS, TYPE II 09/30/2006  . TIA (transient ischemic attack)   . TOBACCO USER 09/30/2006    Qualifier: Diagnosis of  By: Delrae AlfredMulberry MD, [name]    . Multiple sclerosis, relapsing-remitting 05/29/2012  . DYSLIPIDEMIA 09/30/2006    Qualifier: Diagnosis of  By: Delrae AlfredMulberry MD, [name]    . DIABETES MELLITUS, TYPE II 09/30/2006    Qualifier: Diagnosis of  By: Delrae AlfredMulberry MD, [name]    . BENIGN POSITIONAL VERTIGO 01/27/2010    Qualifier: Diagnosis of  By: Delrae AlfredMulberry MD, [name]    . Stroke    History reviewed. No pertinent past surgical history. Social History:  reports that she has quit smoking. She does not have any smokeless tobacco history on file. She reports that she does not drink alcohol or use illicit drugs.  No Known Allergies  Family History  Problem Relation Age of Onset  . Hypertension Mother   . Kidney failure Mother   . Hypertension Father   . Gout Father   .  Hypertension Sister   . Diabetes Sister   . Hypertension Brother      Prior to Admission medications   Medication Sig Start Date End Date Taking? Authorizing Provider  amLODipine (NORVASC) 10 MG tablet Take 1 tablet (10 mg total) by mouth daily. 05/25/13  Yes Jeanann Lewandowskylugbemiga [name], MD  aspirin 81 MG tablet Take 1 tablet (81 mg total) by mouth daily. 09/19/12  Yes Dorothea OgleIskra M Myers, MD  HYDROcodone-acetaminophen (NORCO/VICODIN) 5-325 MG per tablet Take 1-2 tablets by mouth every 4 (four) hours as needed for moderate pain. 05/11/13  Yes Dorothea OgleIskra M Myers, MD  meclizine (ANTIVERT) 25 MG tablet Take 1 tablet (25 mg total) by mouth 3 (three) times daily as needed for dizziness or nausea. 09/19/12  Yes Dorothea OgleIskra M Myers, MD  metFORMIN (GLUCOPHAGE) 500 MG tablet Take 1 tablet (500 mg total) by mouth daily with breakfast. 05/25/13  Yes Jeanann Lewandowskylugbemiga [name], MD  metoprolol tartrate (LOPRESSOR) 25 MG tablet Take 25 mg by mouth 2 (two) times daily.   Yes Historical Provider, MD  potassium chloride SA (K-DUR,KLOR-CON) 20 MEQ tablet Take 1 tablet (20 mEq total) by mouth daily. 04/30/13  Yes Olivia Mackielga M Otter, MD   Physical Exam: Filed Vitals:   06/03/13 0100  BP: 139/76  Pulse: 85  Temp:   Resp: 17    BP 139/76  Pulse 85  Temp(Src) 99.3 F (37.4 C) (Rectal)  Resp 17  Ht 5\' 2"  (1.575 m)  Wt 57.607 kg (127 lb)  BMI 23.22 kg/m2  SpO2 100%  LMP 08/23/2012  General Appearance:    Alert, oriented x2, no distress, appears stated age  Head:    Normocephalic, atraumatic  Eyes:    PERRL, EOMI, sclera non-icteric        Nose:   Nares without drainage or epistaxis. Mucosa, turbinates normal  Throat:   Moist mucous membranes. Oropharynx without erythema or exudate.  Neck:   Supple. No carotid bruits.  No thyromegaly.  No lymphadenopathy.   Back:     No CVA tenderness, no spinal tenderness  Lungs:     Clear to auscultation bilaterally, without wheezes, rhonchi or rales  Chest wall:    No tenderness to palpitation  Heart:     Regular rate and rhythm without murmurs, gallops, rubs  Abdomen:     Soft, non-tender, nondistended, normal bowel sounds, no organomegaly  Genitalia:    deferred  Rectal:    deferred  Extremities:   No clubbing, cyanosis or edema.  Pulses:   2+ and symmetric all extremities  Skin:   Skin color, texture, turgor normal, no rashes or lesions  Lymph nodes:   Cervical, supraclavicular, and axillary nodes normal  Neurologic:   CNII-XII intact. Normal strength, sensation and reflexes      throughout    Labs on Admission:  Basic Metabolic Panel:  Recent Labs Lab 06/02/13 2309  NA 151*  K 2.4*  CL 108  CO2 25  GLUCOSE 109*  BUN 11  CREATININE 0.61  CALCIUM 9.9   Liver Function Tests:  Recent Labs Lab 06/02/13 2309  AST 18  ALT 11  ALKPHOS 69  BILITOT 2.5*  PROT 8.1  ALBUMIN 4.6   No results found for this basename: LIPASE, AMYLASE,  in the last 168 hours No results found for this basename: AMMONIA,  in the last 168 hours CBC:  Recent Labs Lab 06/02/13 2309  WBC 11.0*  NEUTROABS 9.8*  HGB 14.9  HCT 42.4  MCV 92.8  PLT 246   Cardiac Enzymes: No results found for this basename: CKTOTAL, CKMB, CKMBINDEX, TROPONINI,  in the last 168 hours  BNP (last 3 results) No results found for this basename: PROBNP,  in the last 8760 hours CBG: No results found for this basename: GLUCAP,  in the last 168 hours  Radiological Exams on Admission: Ct Head Wo Contrast  06/03/2013   CLINICAL DATA:  Altered mental status. History of multiple sclerosis.  EXAM: CT HEAD WITHOUT CONTRAST  TECHNIQUE: Contiguous axial images were obtained from the base of the skull through the vertex without intravenous contrast.  COMPARISON:  MR HEAD WO/W CM dated 09/06/2012; CT HEAD W/O CM dated 09/06/2012  FINDINGS: Mild ventriculomegaly, likely on the basis of global parenchymal brain volume loss as there is overall commensurate enlargement of cerebral sulci and cerebellar folia, advanced for age though,  stable from prior MRI. Moderate to severe confluent white matter hypodensities are similar to prior examination without midline shift, mass effect, intraparenchymal hemorrhage nor acute large vascular territory infarct. Right basal ganglia perivascular space.  No abnormal extra-axial fluid collections. Basal cisterns are patent.  Visualized paranasal sinuses and mastoid air cells are well aerated. No skull fracture.  IMPRESSION: No acute intracranial process.  Moderate to severe white matter changes similar to prior examination compatible with patient's history of demyelination. Mild global parenchymal brain volume loss for age.   Electronically Signed   By:  Courtnay  Bloomer   On: 06/03/2013 00:39   Dg Chest Port 1 View  06/02/2013   CLINICAL DATA:  Emesis, altered mental status  EXAM: PORTABLE CHEST - 1 VIEW  COMPARISON:  05/09/2013  FINDINGS: Cardiac shadow is within normal limits. The lungs are well aerated bilaterally. Increased density is noted in the bases bilaterally consistent with bibasilar infiltrates. This slightly more prominent on the left than the right. No other focal abnormality is seen.  IMPRESSION: Bibasilar infiltrates left greater than right.   Electronically Signed   By: Alcide Clever M.D.   On: 06/02/2013 23:13    EKG: Independently reviewed.  Assessment/Plan Active Problems:   Aspiration pneumonia   1. Aspiration PNA - infiltrates on CXR, history c/w aspiration, will treat as aspiration PNA with unasyn per pharm consult.  Cultures pending. 2. AMS - EEG pending at this point.  Unclear wether patient has an underlying PNA before the episode of unresponsiveness this afternoon (thus AMS is secondary to PNA), or wether the PNA started after the episode of unresponsiveness this afternoon.  Although with that said, her fever / leukocytosis argues for PNA rather than pneumonitis and aspiration PNA would typically be seen some 48-72 hours after the aspiration event.  At this point given  improvement in mental status, lack of meningismus or headache, EDP does not feel that LP is warranted. 3. DM2 - holding metformin given slightly elevated lactic acid level and using low dose SSI instead.    Code Status: Full Code  Family Communication: Family at bedside Disposition Plan: Admit to inpatient   Time spent: 51 min  Hillary Bow [facility] Pager [phone]  If 7AM-7PM, please contact the day team taking care of the patient [email or web address] Password [email or web address] 06/03/2013, 1:21 AM

## 2013-06-03 NOTE — ED Notes (Signed)
Dr Ronald LoboKunz notified of potassium 2.4

## 2013-06-03 NOTE — Progress Notes (Addendum)
[facility] PROGRESS NOTE  [name] [name] RUE:[id number] DOB: 05/09/1970 DOA: 06/02/2013 PCP: [name] [name], [name], MD  Assessment/Plan: 1. Suspected Aspiration PNA 1. B infiltrates on CXR 2. Currently on vanc and unasyn 3. Tmax of 100.8 last night, however afebrile this AM 4. WBC rising slightly at 13K 5. Awaiting cultures 2. Syncope/Unrespnsiveness/Confusion 1. EEG pending 2. Head CT unremarkable 3. Pt reports new onset forgetfulness (not remembering son's names, ages, etc.) 3. DM 1. Will cont on carb modified diet 2. Cont on SSI coverage 4. Hx MS 1. CT head unremarkable 2. Pt is rather poor historian, but reports marked generalized weakness after moving bowels last night 3. Consider repeat brain MRI (last was on 08/2012) for interval change 5. DVT prophylaxis 1. Heparin subQ  Code Status: Full Family Communication: Pt in room Disposition Plan: Pending   Consultants:    Procedures:    Antibiotics:  Vancomycin 5/13>>>  Unasyn 5/13>>>  HPI/Subjective: Pt frustrated about forgetfulness.   Objective: Filed Vitals:   06/03/13 0130 06/03/13 0224 06/03/13 0433 06/03/13 0844  BP: 137/78 148/93 91/61 120/78  Pulse: 87 90 70 82  Temp:  98.6 F (37 C) 98.2 F (36.8 C) 98.7 F (37.1 C)  TempSrc:  Oral Oral Oral  Resp: 18 16 18 18   Height:      Weight:  56.518 kg (124 lb 9.6 oz)    SpO2: 99% 97% 99% 99%   No intake or output data in the 24 hours ending 06/03/13 1027 Filed Weights   06/02/13 2127 06/03/13 0224  Weight: 57.607 kg (127 lb) 56.518 kg (124 lb 9.6 oz)    Exam:   General:  Awake, in nad  Cardiovascular: regular, s1, s2  Respiratory: normal resp effort, no wheezing  Abdomen: soft, nondistended  Musculoskeletal: perfused, no clubbing   Data Reviewed: Basic Metabolic Panel:  Recent Labs Lab 06/02/13 2309 06/03/13 0322  NA 151* 145  K 2.4* 2.4*  CL 108 106  CO2 25 24  GLUCOSE 109* 157*  BUN 11 10  CREATININE 0.61 0.57   CALCIUM 9.9 9.1  MG  --  1.9   Liver Function Tests:  Recent Labs Lab 06/02/13 2309  AST 18  ALT 11  ALKPHOS 69  BILITOT 2.5*  PROT 8.1  ALBUMIN 4.6   No results found for this basename: LIPASE, AMYLASE,  in the last 168 hours No results found for this basename: AMMONIA,  in the last 168 hours CBC:  Recent Labs Lab 06/02/13 2309 06/03/13 0322  WBC 11.0* 13.1*  NEUTROABS 9.8*  --   HGB 14.9 12.5  HCT 42.4 36.1  MCV 92.8 91.6  PLT 246 221   Cardiac Enzymes: No results found for this basename: CKTOTAL, CKMB, CKMBINDEX, TROPONINI,  in the last 168 hours BNP (last 3 results) No results found for this basename: PROBNP,  in the last 8760 hours CBG:  Recent Labs Lab 06/03/13 0235 06/03/13 0843  GLUCAP 123* 98    No results found for this or any previous visit (from the past 240 hour(s)).   Studies: Ct Head Wo Contrast  06/03/2013   CLINICAL DATA:  Altered mental status. History of multiple sclerosis.  EXAM: CT HEAD WITHOUT CONTRAST  TECHNIQUE: Contiguous axial images were obtained from the base of the skull through the vertex without intravenous contrast.  COMPARISON:  MR HEAD WO/W CM dated 09/06/2012; CT HEAD W/O CM dated 09/06/2012  FINDINGS: Mild ventriculomegaly, likely on the basis of global parenchymal brain volume loss as there is overall  commensurate enlargement of cerebral sulci and cerebellar folia, advanced for age though, stable from prior MRI. Moderate to severe confluent white matter hypodensities are similar to prior examination without midline shift, mass effect, intraparenchymal hemorrhage nor acute large vascular territory infarct. Right basal ganglia perivascular space.  No abnormal extra-axial fluid collections. Basal cisterns are patent.  Visualized paranasal sinuses and mastoid air cells are well aerated. No skull fracture.  IMPRESSION: No acute intracranial process.  Moderate to severe white matter changes similar to prior examination compatible with  patient's history of demyelination. Mild global parenchymal brain volume loss for age.   Electronically Signed   By: Awilda Metro   On: 06/03/2013 00:39   Dg Chest Port 1 View  06/02/2013   CLINICAL DATA:  Emesis, altered mental status  EXAM: PORTABLE CHEST - 1 VIEW  COMPARISON:  05/09/2013  FINDINGS: Cardiac shadow is within normal limits. The lungs are well aerated bilaterally. Increased density is noted in the bases bilaterally consistent with bibasilar infiltrates. This slightly more prominent on the left than the right. No other focal abnormality is seen.  IMPRESSION: Bibasilar infiltrates left greater than right.   Electronically Signed   By: Alcide Clever M.D.   On: 06/02/2013 23:13    Scheduled Meds: . amLODipine  10 mg Oral Daily  . ampicillin-sulbactam (UNASYN) IV  1.5 g Intravenous Q6H  . aspirin  81 mg Oral Daily  . heparin  5,000 Units Subcutaneous 3 times per day  . insulin aspart  0-9 Units Subcutaneous TID WC  . metoprolol tartrate  25 mg Oral BID  . potassium chloride  10 mEq Intravenous Q1 Hr x 6  . potassium chloride SA  20 mEq Oral Daily   Continuous Infusions:   Active Problems:   Aspiration pneumonia  Time spent:  [name]  [facility] Pager [phone]. If 7PM-7AM, please contact night-coverage at [email or web address], password [facility] 06/03/2013, 10:27 AM  LOS: 1 day

## 2013-06-03 NOTE — Progress Notes (Signed)
ANTIBIOTIC CONSULT NOTE - INITIAL  Pharmacy Consult for Unasyn Indication: aspiration pna  No Known Allergies  Patient Measurements: Height: 5\' 2"  (157.5 cm) Weight: 127 lb (57.607 kg) IBW/kg (Calculated) : 50.1  Vital Signs: Temp: 99.3 F (37.4 C) (05/13 0015) Temp src: Rectal (05/12 2319) BP: 139/76 mmHg (05/13 0100) Pulse Rate: 85 (05/13 0100) Intake/Output from previous day:   Intake/Output from this shift:    Labs:  Recent Labs  06/02/13 2309  WBC 11.0*  HGB 14.9  PLT 246  CREATININE 0.61   Estimated Creatinine Clearance: 71.7 ml/min (by C-G formula based on Cr of 0.61). No results found for this basename: VANCOTROUGH, Leodis Binet, VANCORANDOM, GENTTROUGH, GENTPEAK, GENTRANDOM, TOBRATROUGH, TOBRAPEAK, TOBRARND, AMIKACINPEAK, AMIKACINTROU, AMIKACIN,  in the last 72 hours   Microbiology: Recent Results (from the past 720 hour(s))  URINE CULTURE     Status: None   Collection Time    05/10/13  4:09 PM      Result Value Ref Range Status   Specimen Description URINE, CLEAN CATCH   Final   Special Requests NONE   Final   Culture  Setup Time     Final   Value: 05/10/2013 21:52     Performed at [facility] Count     Final   Value: 50,000 COLONIES/ML     Performed at [facility]   Culture     Final   Value: Multiple bacterial morphotypes present, none predominant. Suggest appropriate recollection if clinically indicated.     Performed at [facility]   Report Status 05/11/2013 FINAL   Final    Medical History: Past Medical History  Diagnosis Date  . Multiple sclerosis   . HYPERTENSION 09/30/2006  . DIABETES MELLITUS, TYPE II 09/30/2006  . TIA (transient ischemic attack)   . TOBACCO USER 09/30/2006    Qualifier: Diagnosis of  By: Delrae Alfred MD, Lanora Manis    . Multiple sclerosis, relapsing-remitting 05/29/2012  . DYSLIPIDEMIA 09/30/2006    Qualifier: Diagnosis of  By: Delrae Alfred MD, Lanora Manis    . DIABETES MELLITUS, TYPE II 09/30/2006   Qualifier: Diagnosis of  By: Delrae Alfred MD, Lanora Manis    . BENIGN POSITIONAL VERTIGO 01/27/2010    Qualifier: Diagnosis of  By: Delrae Alfred MD, Lanora Manis    . Stroke     Medications:  See med rec  Assessment: 43 y.o. female presents with AMS. Found by son unresponsive for at least 1 minute and vomited. Pt received Vanc and Zosyn ~2330 in ED. Now to begin Unasyn for aspiration pna.  Goal of Therapy:  Resolution of infection  Plan:  1. Unasyn 1.5gm IV q6h 2. Will f/u micro data, pt's clinical condition, and renal function  Christoper Fabian, PharmD, BCPS Clinical pharmacist, pager [phone] 06/03/2013,1:27 AM

## 2013-06-04 ENCOUNTER — Inpatient Hospital Stay (HOSPITAL_COMMUNITY): Payer: Medicare Other

## 2013-06-04 DIAGNOSIS — R4182 Altered mental status, unspecified: Secondary | ICD-10-CM

## 2013-06-04 DIAGNOSIS — I10 Essential (primary) hypertension: Secondary | ICD-10-CM

## 2013-06-04 DIAGNOSIS — E876 Hypokalemia: Secondary | ICD-10-CM

## 2013-06-04 LAB — URINE CULTURE
Colony Count: NO GROWTH
Culture: NO GROWTH
SPECIAL REQUESTS: NORMAL

## 2013-06-04 LAB — GLUCOSE, CAPILLARY
GLUCOSE-CAPILLARY: 141 mg/dL — AB (ref 70–99)
Glucose-Capillary: 99 mg/dL (ref 70–99)
Glucose-Capillary: 92 mg/dL (ref 70–99)
Glucose-Capillary: 142 mg/dL — ABNORMAL HIGH (ref 70–99)

## 2013-06-04 LAB — BASIC METABOLIC PANEL
BUN: 9 mg/dL (ref 6–23)
CALCIUM: 8.9 mg/dL (ref 8.4–10.5)
CO2: 16 mEq/L — ABNORMAL LOW (ref 19–32)
Chloride: 107 mEq/L (ref 96–112)
Creatinine, Ser: 0.57 mg/dL (ref 0.50–1.10)
GFR calc Af Amer: >90 mL/min (ref >90)
GLUCOSE: 75 mg/dL (ref 70–99)
Potassium: 4.6 mEq/L (ref 3.7–5.3)
Sodium: 140 mEq/L (ref 137–147)

## 2013-06-04 NOTE — Progress Notes (Signed)
Premedicated with 2 mg Valium po before MRI

## 2013-06-04 NOTE — Procedures (Signed)
History: 43 year old female with syncope/unresponsiveness  Sedation: None  Technique: This is a 17 channel routine scalp EEG performed at the bedside with bipolar and monopolar montages arranged in accordance to the international 10/20 system of electrode placement. One channel was dedicated to EKG recording.    Background: There is a well defined posterior dominant rhythm of 10 Hz that attenuates with eye opening. The background consists predominantly of intermixed alpha and beta activities. There is irregular delta activity present at T7, F7 >P7.   Photic stimulation: Physiologic driving is present  EEG Abnormalities: 1) irregular left temporal delta  Clinical Interpretation: This normal EEG is consistent with a nonspecific focal left temporal cerebral dysfunction. This can be seen in subcortical white matter disease such as multiple sclerosis. There was no seizure or seizure predisposition recorded on this study.   Ritta Slot, MD [facility] [phone]  If 7pm- 7am, please page neurology on call as listed in AMION.

## 2013-06-04 NOTE — Progress Notes (Signed)
[facility] PROGRESS NOTE  [name] SOLTYSIAK EGB:[id number] DOB: 24-Jun-1970 DOA: 06/02/2013 PCP: Jeanann Lewandowsky, MD  Assessment/Plan: 1. Suspected Aspiration PNA 1. B infiltrates on CXR 2. Currently on vanc and unasyn 3. Afebrile overnight 4. Awaiting cultures 2. Syncope/Unrespnsiveness/Confusion 1. EEG done 5/14, awaiting results 2. Head CT unremarkable 3. Pt reports new onset forgetfulness (not remembering son's names, ages, etc.) 3. DM 1. Will cont on carb modified diet 2. Cont on SSI coverage 4. Hx MS 1. CT head unremarkablet 2. Awaiting brain MRI (last was on 08/2012) for interval change 5. DVT prophylaxis 1. Heparin subQ 6. Hypokalemia 1. Resolved  Code Status: Full Family Communication: Pt in room Disposition Plan: Pending   Consultants:    Procedures:    Antibiotics:  Vancomycin 5/13>>>  Unasyn 5/13>>>  HPI/Subjective: Reports feeling better. No acute events overnight. Memory improved.   Objective: Filed Vitals:   06/03/13 1605 06/03/13 2039 06/04/13 0610 06/04/13 0815  BP: 126/85 121/74 120/86 140/88  Pulse: 92 88 79 84  Temp: 98.6 F (37 C) 98.6 F (37 C) 99.3 F (37.4 C) 98.9 F (37.2 C)  TempSrc: Oral Oral Oral Oral  Resp: 18 16 16 18   Height:      Weight:  58.06 kg (128 lb)    SpO2: 100% 100% 100% 100%    Intake/Output Summary (Last 24 hours) at 06/04/13 1307 Last data filed at 06/04/13 0840  Gross per 24 hour  Intake    960 ml  Output   2315 ml  Net  -1355 ml   Filed Weights   06/02/13 2127 06/03/13 0224 06/03/13 2039  Weight: 57.607 kg (127 lb) 56.518 kg (124 lb 9.6 oz) 58.06 kg (128 lb)    Exam:   General:  Awake, in nad  Cardiovascular: regular, s1, s2  Respiratory: normal resp effort, no wheezing  Abdomen: soft, nondistended  Musculoskeletal: perfused, no clubbing   Data Reviewed: Basic Metabolic Panel:  Recent Labs Lab 06/02/13 2309 06/03/13 0322 06/04/13 0550  NA 151* 145 140  K 2.4* 2.4*  4.6  CL 108 106 107  CO2 25 24 16*  GLUCOSE 109* 157* 75  BUN 11 10 9   CREATININE 0.61 0.57 0.57  CALCIUM 9.9 9.1 8.9  MG  --  1.9  --    Liver Function Tests:  Recent Labs Lab 06/02/13 2309  AST 18  ALT 11  ALKPHOS 69  BILITOT 2.5*  PROT 8.1  ALBUMIN 4.6   No results found for this basename: LIPASE, AMYLASE,  in the last 168 hours No results found for this basename: AMMONIA,  in the last 168 hours CBC:  Recent Labs Lab 06/02/13 2309 06/03/13 0322  WBC 11.0* 13.1*  NEUTROABS 9.8*  --   HGB 14.9 12.5  HCT 42.4 36.1  MCV 92.8 91.6  PLT 246 221   Cardiac Enzymes: No results found for this basename: CKTOTAL, CKMB, CKMBINDEX, TROPONINI,  in the last 168 hours BNP (last 3 results) No results found for this basename: PROBNP,  in the last 8760 hours CBG:  Recent Labs Lab 06/03/13 0843 06/03/13 1203 06/03/13 1604 06/03/13 2035 06/04/13 0808  GLUCAP 98 76 212* 93 99    Recent Results (from the past 240 hour(s))  CULTURE, BLOOD (ROUTINE X 2)     Status: None   Collection Time    06/02/13 11:09 PM      Result Value Ref Range Status   Specimen Description BLOOD RIGHT ARM   Final   Special Requests BOTTLES  DRAWN AEROBIC AND ANAEROBIC 10CC EACH   Final   Culture  Setup Time     Final   Value: 06/03/2013 05:11     [facility]   Culture     Final   Value:        BLOOD CULTURE RECEIVED NO GROWTH TO DATE CULTURE WILL BE HELD FOR 5 DAYS BEFORE ISSUING A FINAL NEGATIVE REPORT     [facility]formed at [facility]   Report Status PENDING   Incomplete  CULTURE, BLOOD (ROUTINE X 2)     Status: None   Collection Time    06/02/13 11:14 PM      Result Value Ref Range Status   Specimen Description BLOOD RIGHT HAND   Final   Special Requests BOTTLES DRAWN AEROBIC ONLY 10CC   Final   Culture  Setup Time     Final   Value: 06/03/2013 05:11     [facility]formed [facility]   Culture     Final   Value:        BLOOD CULTURE RECEIVED NO GROWTH TO  DATE CULTURE WILL BE HELD FOR 5 DAYS BEFORE ISSUING A FINAL NEGATIVE REPORT     [facility]formed [facility]   Report Status PENDING   Incomplete  URINE CULTURE     Status: None   Collection Time    06/02/13 11:38 PM      Result Value Ref Range Status   Specimen Description URINE, RANDOM   Final   Special Requests Normal   Final   Culture  Setup Time     Final   Value: 06/03/2013 00:28     [facility]   Colony Count     Final   Value: NO GROWTH     [facility]formed at [facility]   Culture     Final   Value: NO GROWTH     [facility]formed at [facility]   Report Status 06/04/2013 FINAL   Final     Studies: Ct Head Wo Contrast  06/03/2013   CLINICAL DATA:  Altered mental status. History of multiple sclerosis.  EXAM: CT HEAD WITHOUT CONTRAST  TECHNIQUE: Contiguous axial images were obtained from the base of the skull through the vertex without intravenous contrast.  COMPARISON:  MR HEAD WO/W CM dated 09/06/2012; CT HEAD W/O CM dated 09/06/2012  FINDINGS: Mild ventriculomegaly, likely on the basis of global parenchymal brain volume loss as there is overall commensurate enlargement of cerebral sulci and cerebellar folia, advanced for age though, stable from prior MRI. Moderate to severe confluent white matter hypodensities are similar to prior examination without midline shift, mass effect, intraparenchymal hemorrhage nor acute large vascular territory infarct. Right basal ganglia perivascular space.  No abnormal extra-axial fluid collections. Basal cisterns are patent.  Visualized paranasal sinuses and mastoid air cells are well aerated. No skull fracture.  IMPRESSION: No acute intracranial process.  Moderate to severe white matter changes similar to prior examination compatible with patient's history of demyelination. Mild global parenchymal brain volume loss for age.   Electronically Signed   By: Awilda Metroourtnay  Bloomer   On: 06/03/2013 00:39   Dg Chest Port 1  View  06/02/2013   CLINICAL DATA:  Emesis, altered mental status  EXAM: PORTABLE CHEST - 1 VIEW  COMPARISON:  05/09/2013  FINDINGS: Cardiac shadow is within normal limits. The lungs are well aerated bilaterally. Increased density is noted in the bases bilaterally consistent with  bibasilar infiltrates. This slightly more prominent on the left than the right. No other focal abnormality is seen.  IMPRESSION: Bibasilar infiltrates left greater than right.   Electronically Signed   By: Alcide Clever M.D.   On: 06/02/2013 23:13    Scheduled Meds: . amLODipine  10 mg Oral Daily  . ampicillin-sulbactam (UNASYN) IV  1.5 g Intravenous Q6H  . aspirin  81 mg Oral Daily  . heparin  5,000 Units Subcutaneous 3 times per day  . insulin aspart  0-15 Units Subcutaneous TID WC  . insulin aspart  0-5 Units Subcutaneous QHS  . metoprolol tartrate  25 mg Oral BID  . potassium chloride SA  20 mEq Oral Daily   Continuous Infusions:   Principal Problem:   Aspiration pneumonia Active Problems:   DIABETES MELLITUS, TYPE II   HYPERTENSION  Time spent:  [name]  [facility] Pager [phone]. If 7PM-7AM, please contact night-coverage at [email or web address], password [facility] 06/04/2013, 1:07 PM  LOS: 2 days

## 2013-06-04 NOTE — Progress Notes (Signed)
EEG completed; results pending.    

## 2013-06-04 NOTE — Progress Notes (Signed)
Pt gone down for EEG via bed.

## 2013-06-04 NOTE — Consult Note (Signed)
Reason for Consult: History of multiple sclerosis admitted for aspiration pneumonia.  HPI:                                                                                                                                          [name] [name] is an 43 y.o. female with a history of multiple sclerosis, hypertension, hyperlipidemia, diabetes mellitus, TIA and benign positional vertigo who was admitted on 06/02/2013 for management of aspiration pneumonia. Patient was previously treated at [facility], but has not been seen by a neurologist for quite some time. Only treatment has been courses of steroids. Patient is ambulatory with use of walker. She became significantly weaker with her current illness. MRI of her brain today showed severe diffuse white matter changes. No new areas of demyelination were noted.  Past Medical History  Diagnosis Date  . Multiple sclerosis   . HYPERTENSION 09/30/2006  . DIABETES MELLITUS, TYPE II 09/30/2006  . TIA (transient ischemic attack)   . TOBACCO USER 09/30/2006    Qualifier: Diagnosis of  By: Amil Amen MD, Benjamine Mola    . Multiple sclerosis, relapsing-remitting 05/29/2012  . DYSLIPIDEMIA 09/30/2006    Qualifier: Diagnosis of  By: Amil Amen MD, Benjamine Mola    . DIABETES MELLITUS, TYPE II 09/30/2006    Qualifier: Diagnosis of  By: Amil Amen MD, Benjamine Mola    . BENIGN POSITIONAL VERTIGO 01/27/2010    Qualifier: Diagnosis of  By: Amil Amen MD, Benjamine Mola    . Stroke     History reviewed. No pertinent past surgical history.  Family History  Problem Relation Age of Onset  . Hypertension Mother   . Kidney failure Mother   . Hypertension Father   . Gout Father   . Hypertension Sister   . Diabetes Sister   . Hypertension Brother     Social History:  reports that she has quit smoking. She does not have any smokeless tobacco history on file. She reports that she does not drink alcohol or use illicit drugs.  No Known Allergies  MEDICATIONS:                                                                                                                      I have reviewed the patient's current medications.   ROS:  History obtained from chart review and the patient  General ROS: Low-grade fever prior to admission with nausea and vomiting Psychological ROS: negative for - behavioral disorder, hallucinations, memory difficulties, mood swings or suicidal ideation Ophthalmic ROS: negative for - blurry vision, double vision, eye pain or loss of vision ENT ROS: negative for - epistaxis, nasal discharge, oral lesions, sore throat, tinnitus or vertigo Allergy and Immunology ROS: negative for - hives or itchy/watery eyes Hematological and Lymphatic ROS: negative for - bleeding problems, bruising or swollen lymph nodes Endocrine ROS: negative for - galactorrhea, hair pattern changes, polydipsia/polyuria or temperature intolerance Respiratory ROS: negative for - cough, hemoptysis, shortness of breath or wheezing Cardiovascular ROS: negative for - chest pain, dyspnea on exertion, edema or irregular heartbeat Gastrointestinal ROS: negative for - abdominal pain, diarrhea, hematemesis, nausea/vomiting or stool incontinence Genito-Urinary ROS: negative for - dysuria, hematuria, incontinence or urinary frequency/urgency Musculoskeletal ROS: negative for - joint swelling or muscular weakness Neurological ROS: as noted in HPI Dermatological ROS: negative for rash and skin lesion changes   Blood pressure 135/88, pulse 84, temperature 97.5 F (36.4 C), temperature source Oral, resp. rate 18, height _0  (1.575 m), weight 58.06 kg (128 lb), last menstrual period 08/23/2012, SpO2 100.00%.   Neurologic Examination:                                                                                                      Mental  Status: Alert, oriented, thought content appropriate.  Speech moderately dysarthric with scanning quality Able to follow commands without difficulty. Cranial Nerves: II-Visual fields were normal. III/IV/VI-Pupils were equal and reacted. Extraocular movements were full and conjugate with no nystagmus.    V/VII-no facial numbness and no facial weakness. VIII-normal. X-moderately dysarthric speech; symmetrical palatal movement. Motor: 5/5 bilaterally with slightly increased muscle tone in lower extremities. Sensory: Normal throughout. Deep Tendon Reflexes: 2+, brisk and symmetric. Plantars: Mute bilaterally Cerebellar: Moderately impaired finger-to-nose and heel-to-shin performance bilaterally.  Lab Results  Component Value Date/Time   CHOL 142 05/28/2012 10:00 AM    Results for orders placed during the hospital encounter of 06/02/13 (from the past 48 hour(s))  CULTURE, BLOOD (ROUTINE X 2)     Status: None   Collection Time    06/02/13 11:09 PM      Result Value Ref Range   Specimen Description BLOOD RIGHT ARM     Special Requests BOTTLES DRAWN AEROBIC AND ANAEROBIC 10CC EACH     Culture  Setup Time       Value: 06/03/2013 05:11     Performed at [facility]   Culture       Value:        BLOOD CULTURE RECEIVED NO GROWTH TO DATE CULTURE WILL BE HELD FOR 5 DAYS BEFORE ISSUING A FINAL NEGATIVE REPORT     Performed at [facility]   Report Status PENDING    CBC WITH DIFFERENTIAL     Status: Abnormal   Collection Time    06/02/13 11:09 PM      Result Value Ref Range  WBC 11.0 (*) 4.0 - 10.5 K/uL   RBC 4.57  3.87 - 5.11 MIL/uL   Hemoglobin 14.9  12.0 - 15.0 g/dL   HCT 42.4  36.0 - 46.0 %   MCV 92.8  78.0 - 100.0 fL   MCH 32.6  26.0 - 34.0 pg   MCHC 35.1  30.0 - 36.0 g/dL   RDW 11.7  11.5 - 15.5 %   Platelets 246  150 - 400 K/uL   Neutrophils Relative % 89 (*) 43 - 77 %   Neutro Abs 9.8 (*) 1.7 - 7.7 K/uL   Lymphocytes Relative 3 (*) 12 - 46 %   Lymphs Abs 0.3  (*) 0.7 - 4.0 K/uL   Monocytes Relative 8  3 - 12 %   Monocytes Absolute 0.9  0.1 - 1.0 K/uL   Eosinophils Relative 0  0 - 5 %   Eosinophils Absolute 0.0  0.0 - 0.7 K/uL   Basophils Relative 0  0 - 1 %   Basophils Absolute 0.0  0.0 - 0.1 K/uL  COMPREHENSIVE METABOLIC PANEL     Status: Abnormal   Collection Time    06/02/13 11:09 PM      Result Value Ref Range   Sodium 151 (*) 137 - 147 mEq/L   Potassium 2.4 (*) 3.7 - 5.3 mEq/L   Comment: CRITICAL RESULT CALLED TO, READ BACK BY AND VERIFIED WITH:     B.JACOBBAILEY RN [id number] 06/03/13 E.GADDY   Chloride 108  96 - 112 mEq/L   CO2 25  19 - 32 mEq/L   Glucose, Bld 109 (*) 70 - 99 mg/dL   BUN 11  6 - 23 mg/dL   Creatinine, Ser 0.61  0.50 - 1.10 mg/dL   Calcium 9.9  8.4 - 10.5 mg/dL   Total Protein 8.1  6.0 - 8.3 g/dL   Albumin 4.6  3.5 - 5.2 g/dL   AST 18  0 - 37 U/L   ALT 11  0 - 35 U/L   Alkaline Phosphatase 69  39 - 117 U/L   Total Bilirubin 2.5 (*) 0.3 - 1.2 mg/dL   GFR calc non Af Amer >90  >90 mL/min   GFR calc Af Amer >90  >90 mL/min   Comment: (NOTE)     The eGFR has been calculated using the CKD EPI equation.     This calculation has not been validated in all clinical situations.     eGFR's persistently <90 mL/min signify possible Chronic Kidney     Disease.  PROTIME-INR     Status: None   Collection Time    06/02/13 11:09 PM      Result Value Ref Range   Prothrombin Time 13.2  11.6 - 15.2 seconds   INR 1.02  0.00 - 1.49  CULTURE, BLOOD (ROUTINE X 2)     Status: None   Collection Time    06/02/13 11:14 PM      Result Value Ref Range   Specimen Description BLOOD RIGHT HAND     Special Requests BOTTLES DRAWN AEROBIC ONLY 10CC     Culture  Setup Time       Value: 06/03/2013 05:11     Performed at [facility]   Culture       Value:        BLOOD CULTURE RECEIVED NO GROWTH TO DATE CULTURE WILL BE HELD FOR 5 DAYS BEFORE ISSUING A FINAL NEGATIVE REPORT     Performed at [facility]  Report Status PENDING     I-STAT TROPOININ, ED     Status: None   Collection Time    06/02/13 11:22 PM      Result Value Ref Range   Troponin i, poc 0.01  0.00 - 0.08 ng/mL   Comment 3            Comment: Due to the release kinetics of cTnI,     a negative result within the first hours     of the onset of symptoms does not rule out     myocardial infarction with certainty.     If myocardial infarction is still suspected,     repeat the test at appropriate intervals.  I-STAT CG4 LACTIC ACID, ED     Status: Abnormal   Collection Time    06/02/13 11:23 PM      Result Value Ref Range   Lactic Acid, Venous 2.96 (*) 0.5 - 2.2 mmol/L  URINALYSIS, ROUTINE W REFLEX MICROSCOPIC     Status: Abnormal   Collection Time    06/02/13 11:38 PM      Result Value Ref Range   Color, Urine YELLOW  YELLOW   APPearance CLEAR  CLEAR   Specific Gravity, Urine 1.016  1.005 - 1.030   pH 7.0  5.0 - 8.0   Glucose, UA NEGATIVE  NEGATIVE mg/dL   Hgb urine dipstick SMALL (*) NEGATIVE   Bilirubin Urine NEGATIVE  NEGATIVE   Ketones, ur 15 (*) NEGATIVE mg/dL   Protein, ur NEGATIVE  NEGATIVE mg/dL   Urobilinogen, UA 1.0  0.0 - 1.0 mg/dL   Nitrite NEGATIVE  NEGATIVE   Leukocytes, UA NEGATIVE  NEGATIVE  URINE CULTURE     Status: None   Collection Time    06/02/13 11:38 PM      Result Value Ref Range   Specimen Description URINE, RANDOM     Special Requests Normal     Culture  Setup Time       Value: 06/03/2013 00:28     Performed at [facility]       Value: NO GROWTH     Performed at [facility]   Culture       Value: NO GROWTH     Performed at [facility]   Report Status 06/04/2013 FINAL    URINE MICROSCOPIC-ADD ON     Status: None   Collection Time    06/02/13 11:38 PM      Result Value Ref Range   Squamous Epithelial / LPF RARE  RARE   WBC, UA 0-2  <3 WBC/hpf   RBC / HPF 0-2  <3 RBC/hpf  I-STAT ARTERIAL BLOOD GAS, ED     Status: Abnormal   Collection Time    06/02/13 11:47 PM       Result Value Ref Range   pH, Arterial 7.496 (*) 7.350 - 7.450   pCO2 arterial 33.0 (*) 35.0 - 45.0 mmHg   pO2, Arterial 78.0 (*) 80.0 - 100.0 mmHg   Bicarbonate 25.4 (*) 20.0 - 24.0 mEq/L   TCO2 26  0 - 100 mmol/L   O2 Saturation 97.0     Acid-Base Excess 3.0 (*) 0.0 - 2.0 mmol/L   Patient temperature 98.6 F     Collection site RADIAL, ALLEN'S TEST ACCEPTABLE     Drawn by RT     Sample type ARTERIAL    GLUCOSE, CAPILLARY     Status: Abnormal   Collection  Time    06/03/13  2:35 AM      Result Value Ref Range   Glucose-Capillary 123 (*) 70 - 99 mg/dL  CBC     Status: Abnormal   Collection Time    06/03/13  3:22 AM      Result Value Ref Range   WBC 13.1 (*) 4.0 - 10.5 K/uL   RBC 3.94  3.87 - 5.11 MIL/uL   Hemoglobin 12.5  12.0 - 15.0 g/dL   HCT 36.1  36.0 - 46.0 %   MCV 91.6  78.0 - 100.0 fL   MCH 31.7  26.0 - 34.0 pg   MCHC 34.6  30.0 - 36.0 g/dL   RDW 11.7  11.5 - 15.5 %   Platelets 221  150 - 400 K/uL  BASIC METABOLIC PANEL     Status: Abnormal   Collection Time    06/03/13  3:22 AM      Result Value Ref Range   Sodium 145  137 - 147 mEq/L   Potassium 2.4 (*) 3.7 - 5.3 mEq/L   Comment: CRITICAL RESULT CALLED TO, READ BACK BY AND VERIFIED WITH:     M.MICHELLE RN [id number] 06/03/13 E.GADDY   Chloride 106  96 - 112 mEq/L   CO2 24  19 - 32 mEq/L   Glucose, Bld 157 (*) 70 - 99 mg/dL   BUN 10  6 - 23 mg/dL   Creatinine, Ser 0.57  0.50 - 1.10 mg/dL   Calcium 9.1  8.4 - 10.5 mg/dL   GFR calc non Af Amer >90  >90 mL/min   GFR calc Af Amer >90  >90 mL/min   Comment: (NOTE)     The eGFR has been calculated using the CKD EPI equation.     This calculation has not been validated in all clinical situations.     eGFR's persistently <90 mL/min signify possible Chronic Kidney     Disease.  MAGNESIUM     Status: None   Collection Time    06/03/13  3:22 AM      Result Value Ref Range   Magnesium 1.9  1.5 - 2.5 mg/dL  GLUCOSE, CAPILLARY     Status: None   Collection Time     06/03/13  8:43 AM      Result Value Ref Range   Glucose-Capillary 98  70 - 99 mg/dL  GLUCOSE, CAPILLARY     Status: None   Collection Time    06/03/13 12:03 PM      Result Value Ref Range   Glucose-Capillary 76  70 - 99 mg/dL  GLUCOSE, CAPILLARY     Status: Abnormal   Collection Time    06/03/13  4:04 PM      Result Value Ref Range   Glucose-Capillary 212 (*) 70 - 99 mg/dL  GLUCOSE, CAPILLARY     Status: None   Collection Time    06/03/13  8:35 PM      Result Value Ref Range   Glucose-Capillary 93  70 - 99 mg/dL  BASIC METABOLIC PANEL     Status: Abnormal   Collection Time    06/04/13  5:50 AM      Result Value Ref Range   Sodium 140  137 - 147 mEq/L   Potassium 4.6  3.7 - 5.3 mEq/L   Comment: HEMOLYSIS AT THIS LEVEL MAY AFFECT RESULT     DELTA CHECK NOTED   Chloride 107  96 - 112 mEq/L   CO2 16 (*) 19 -  32 mEq/L   Glucose, Bld 75  70 - 99 mg/dL   BUN 9  6 - 23 mg/dL   Creatinine, Ser 0.57  0.50 - 1.10 mg/dL   Calcium 8.9  8.4 - 10.5 mg/dL   GFR calc non Af Amer >90  >90 mL/min   GFR calc Af Amer >90  >90 mL/min   Comment: (NOTE)     The eGFR has been calculated using the CKD EPI equation.     This calculation has not been validated in all clinical situations.     eGFR's persistently <90 mL/min signify possible Chronic Kidney     Disease.  GLUCOSE, CAPILLARY     Status: None   Collection Time    06/04/13  8:08 AM      Result Value Ref Range   Glucose-Capillary 99  70 - 99 mg/dL  GLUCOSE, CAPILLARY     Status: None   Collection Time    06/04/13 12:00 PM      Result Value Ref Range   Glucose-Capillary 92  70 - 99 mg/dL  GLUCOSE, CAPILLARY     Status: Abnormal   Collection Time    06/04/13  4:41 PM      Result Value Ref Range   Glucose-Capillary 141 (*) 70 - 99 mg/dL   Comment 1 Notify RN     Comment 2 Documented in Chart      Ct Head Wo Contrast  06/03/2013   CLINICAL DATA:  Altered mental status. History of multiple sclerosis.  EXAM: CT HEAD WITHOUT CONTRAST   TECHNIQUE: Contiguous axial images were obtained from the base of the skull through the vertex without intravenous contrast.  COMPARISON:  MR HEAD WO/W CM dated 09/06/2012; CT HEAD W/O CM dated 09/06/2012  FINDINGS: Mild ventriculomegaly, likely on the basis of global parenchymal brain volume loss as there is overall commensurate enlargement of cerebral sulci and cerebellar folia, advanced for age though, stable from prior MRI. Moderate to severe confluent white matter hypodensities are similar to prior examination without midline shift, mass effect, intraparenchymal hemorrhage nor acute large vascular territory infarct. Right basal ganglia perivascular space.  No abnormal extra-axial fluid collections. Basal cisterns are patent.  Visualized paranasal sinuses and mastoid air cells are well aerated. No skull fracture.  IMPRESSION: No acute intracranial process.  Moderate to severe white matter changes similar to prior examination compatible with patient's history of demyelination. Mild global parenchymal brain volume loss for age.   Electronically Signed   By: Elon Alas   On: 06/03/2013 00:39   Mr Brain Wo Contrast  06/04/2013   CLINICAL DATA:  Multiple sclerosis with relapse.  EXAM: MRI HEAD WITHOUT CONTRAST  TECHNIQUE: Multiplanar, multiecho pulse sequences of the brain and surrounding structures were obtained without intravenous contrast.  COMPARISON:  MRI with contrast 09/06/2012  FINDINGS: Severe changes of multiple sclerosis. There is severe diffuse hyperintensity throughout the cerebral white matter bilaterally. There are numerous cystic areas of demyelinization in the white matter bilaterally which are slightly smaller compared with the prior study. Demyelinating disease is noted in the central pons and brachium pontis bilaterally right greater than left, similar to the prior study.  Diffusion-weighted imaging is negative for restricted diffusion. Intravenous contrast not administered today. Multiple  enhancing lesions were identified on the prior study.  There is atrophy. Ventricle size is normal and there is no shift of the midline structures. No acute infarct.  IMPRESSION: Severe changes of multiple sclerosis throughout the cerebral white matter and in  the posterior fossa. Cystic demyelination cerebral white matter bilaterally shows mild interval improvement.  No new areas of demyelinization are present. No areas of restricted diffusion are present.   Electronically Signed   By: Franchot Gallo M.D.   On: 06/04/2013 14:44   Dg Chest Port 1 View  06/02/2013   CLINICAL DATA:  Emesis, altered mental status  EXAM: PORTABLE CHEST - 1 VIEW  COMPARISON:  05/09/2013  FINDINGS: Cardiac shadow is within normal limits. The lungs are well aerated bilaterally. Increased density is noted in the bases bilaterally consistent with bibasilar infiltrates. This slightly more prominent on the left than the right. No other focal abnormality is seen.  IMPRESSION: Bibasilar infiltrates left greater than right.   Electronically Signed   By: Inez Catalina M.D.   On: 06/02/2013 23:13    Assessment/Plan: 43 year old lady with history of multiple sclerosis, likely chronic progressive type, presenting with acute aspiration pneumonia. Patient has no clinical signs nor indications on MRI study of acute exacerbation of MS.  Recommendations: 1. No specific treatment intervention for multiple sclerosis is indicated at this point. 2. Speech therapy consultation for evaluation of dysphagia. 3. Physical and no facial therapy consults. 4. Continue management of aspiration pneumonia per primary care team.  We will continue to follow this patient with you.  C.R. Nicole Kindred, MD [facility] [phone]  06/04/2013, 8:39 PM

## 2013-06-05 DIAGNOSIS — R4182 Altered mental status, unspecified: Secondary | ICD-10-CM

## 2013-06-05 LAB — CBC
HEMATOCRIT: 36.5 % (ref 36.0–46.0)
Hemoglobin: 12.6 g/dL (ref 12.0–15.0)
MCH: 32.3 pg (ref 26.0–34.0)
MCHC: 34.5 g/dL (ref 30.0–36.0)
MCV: 93.6 fL (ref 78.0–100.0)
Platelets: 224 10*3/uL (ref 150–400)
RBC: 3.9 MIL/uL (ref 3.87–5.11)
RDW: 12.1 % (ref 11.5–15.5)
WBC: 6 10*3/uL (ref 4.0–10.5)

## 2013-06-05 LAB — BASIC METABOLIC PANEL
BUN: 8 mg/dL (ref 6–23)
CO2: 23 meq/L (ref 19–32)
CREATININE: 0.58 mg/dL (ref 0.50–1.10)
Calcium: 9.5 mg/dL (ref 8.4–10.5)
Chloride: 109 mEq/L (ref 96–112)
GFR calc Af Amer: >90 mL/min (ref >90)
GFR calc non Af Amer: >90 mL/min (ref >90)
Glucose, Bld: 106 mg/dL — ABNORMAL HIGH (ref 70–99)
Potassium: 3.8 mEq/L (ref 3.7–5.3)
Sodium: 145 mEq/L (ref 137–147)

## 2013-06-05 LAB — GLUCOSE, CAPILLARY
Glucose-Capillary: 86 mg/dL (ref 70–99)
Glucose-Capillary: 106 mg/dL — ABNORMAL HIGH (ref 70–99)
Glucose-Capillary: 168 mg/dL — ABNORMAL HIGH (ref 70–99)
Glucose-Capillary: 311 mg/dL — ABNORMAL HIGH (ref 70–99)

## 2013-06-05 MED ORDER — SODIUM CHLORIDE 0.9 % IV SOLN
1000.0000 mg | Freq: Every day | INTRAVENOUS | Status: AC
  Administered 2013-06-05 – 2013-06-07 (×3): 1000 mg via INTRAVENOUS
  Filled 2013-06-05 (×4): qty 8

## 2013-06-05 NOTE — Progress Notes (Signed)
ANTIBIOTIC CONSULT NOTE - FOLLOW UP  Pharmacy Consult for Unasyn Indication: Aspiration PNA  No Known Allergies  Patient Measurements: Height: 5\' 2"  (157.5 cm) Weight: 125 lb 6.4 oz (56.881 kg) IBW/kg (Calculated) : 50.1 Adjusted Body Weight:   Vital Signs: Temp: 97.9 F (36.6 C) (05/15 0818) Temp src: Oral (05/15 0818) BP: 111/72 mmHg (05/15 0818) Pulse Rate: 74 (05/15 0818) Intake/Output from previous day: 05/14 0701 - 05/15 0700 In: 655 [P.O.:655] Out: 975 [Urine:975] Intake/Output from this shift:    Labs:  Recent Labs  06/02/13 2309 06/03/13 0322 06/04/13 0550 06/05/13 0915  WBC 11.0* 13.1*  --  6.0  HGB 14.9 12.5  --  12.6  PLT 246 221  --  224  CREATININE 0.61 0.57 0.57  --    Estimated Creatinine Clearance: 71.7 ml/min (by C-G formula based on Cr of 0.57). No results found for this basename: VANCOTROUGH, Leodis BinetVANCOPEAK, VANCORANDOM, GENTTROUGH, GENTPEAK, GENTRANDOM, TOBRATROUGH, TOBRAPEAK, TOBRARND, AMIKACINPEAK, AMIKACINTROU, AMIKACIN,  in the last 72 hours   Microbiology: Recent Results (from the past 720 hour(s))  URINE CULTURE     Status: None   Collection Time    05/10/13  4:09 PM      Result Value Ref Range Status   Specimen Description URINE, CLEAN CATCH   Final   Special Requests NONE   Final   Culture  Setup Time     Final   Value: 05/10/2013 21:52     Performed at [facility]   Colony Count     Final   Value: 50,000 COLONIES/ML     [facility]formed at [facility]   Culture     Final   Value: Multiple bacterial morphotypes present, none predominant. Suggest appropriate recollection if clinically indicated.     Performed at [facility]   Report Status 05/11/2013 FINAL   Final  CULTURE, BLOOD (ROUTINE X 2)     Status: None   Collection Time    06/02/13 11:09 PM      Result Value Ref Range Status   Specimen Description BLOOD RIGHT ARM   Final   Special Requests BOTTLES DRAWN AEROBIC AND ANAEROBIC 10CC EACH   Final    Culture  Setup Time     Final   Value: 06/03/2013 05:11     Performed at [facility]   Culture     Final   Value:        BLOOD CULTURE RECEIVED NO GROWTH TO DATE CULTURE WILL BE HELD FOR 5 DAYS BEFORE ISSUING A FINAL NEGATIVE REPORT     Performed at [facility]   Report Status PENDING   Incomplete  CULTURE, BLOOD (ROUTINE X 2)     Status: None   Collection Time    06/02/13 11:14 PM      Result Value Ref Range Status   Specimen Description BLOOD RIGHT HAND   Final   Special Requests BOTTLES DRAWN AEROBIC ONLY 10CC   Final   Culture  Setup Time     Final   Value: 06/03/2013 05:11     Performed at [facility]   Culture     Final   Value:        BLOOD CULTURE RECEIVED NO GROWTH TO DATE CULTURE WILL BE HELD FOR 5 DAYS BEFORE ISSUING A FINAL NEGATIVE REPORT     Performed at [facility]   Report Status PENDING   Incomplete  URINE CULTURE     Status: None  Collection Time    06/02/13 11:38 PM      Result Value Ref Range Status   Specimen Description URINE, RANDOM   Final   Special Requests Normal   Final   Culture  Setup Time     Final   Value: 06/03/2013 00:28     Performed at [facility]     Final   Value: NO GROWTH     Performed at [facility]   Culture     Final   Value: NO GROWTH     Performed at [facility]   Report Status 06/04/2013 FINAL   Final    Anti-infectives   Start     Dose/Rate Route Frequency Ordered Stop   06/03/13 0600  ampicillin-sulbactam (UNASYN) 1.5 g in sodium chloride 0.9 % 50 mL IVPB     1.5 g 100 mL/hr over 30 Minutes Intravenous Every 6 hours 06/03/13 0132     06/02/13 2300  vancomycin (VANCOCIN) 1,250 mg in sodium chloride 0.9 % 250 mL IVPB     1,250 mg 166.7 mL/hr over 90 Minutes Intravenous  Once 06/02/13 2256 06/03/13 0149   06/02/13 2300  piperacillin-tazobactam (ZOSYN) IVPB 3.375 g     3.375 g 100 mL/hr over 30 Minutes Intravenous  Once 06/02/13 2256 06/03/13 0000       Assessment: 43 y.o. female presents with AMS. Found by son unresponsive for at least 1 minute and vomited.   PMH: HTN, DM2, HLD, MS, TIA  ID: Unasyn for asp PNA, CXR B/L infiltrates. Afeb. WBC 13.1>>6 today. Scr 0.57 stable.  Vanc 5/13>>5/13 Unasyn 5/13>>  5/12: BCx2>> 5/12: UCx>>negative  CV: VSS- amlodipine10, asa81, lopressor  Endo: SSI while metformin on hold. CBG 86-142  Neuro: reports new onset forgetfulness; MRI pending  Renal: SCr nl. K 2.4 - replacing now 4.6.  PTA Med Issues: metformin  Best Practices: SQ hep  Plan:   Unasyn 1.5g IV q6hrs dose ok. No further adjustment required unless CrCl<30.  Pharmacy will sign off.   Ryder Man S. Merilynn Finland, PharmD, [facility] Clinical Staff Pharmacist Pager [phone]  [facility] Merilynn Finland 06/05/2013,10:17 AM

## 2013-06-05 NOTE — Progress Notes (Signed)
Subjective: Spoke with son re: her course. She had a progressive decline for a week or so prior to admission. Her gait had noticeably worsened per her son.   Exam: Filed Vitals:   06/05/13 0818  BP: 111/72  Pulse: 74  Temp: 97.9 F (36.6 C)  Resp: 18   Gen: In bed, NAD MS: Awake, alert, interactive and appropriate.  VO:PFYTW, EOMI but with markedly saccadic smooth pursuit.  Motor: moves upper extremities well, some weakness in the right lower extremitiy.  Sensory:intact to LT.  Cerebellar: ataxic left hand on FNF  Impression:  43 yo F with worsening of symptoms in the settin gof aspiration pna. I am not sure if PNA came first followed by worsening or worsening precipitated aspiration. No clearly new areas of demyelinatino on MRI, but I think in this setting of recently becoming non-ambulatory, I would favor steroids with the presumption of an MS flare.    Recommendations: 1)Solumedrol 1 gm daily x 3 days.  2) PT,OT  Ritta Slot, MD [facility] [phone]  If 7pm- 7am, please page neurology on call as listed in AMION.

## 2013-06-05 NOTE — Evaluation (Addendum)
Physical Therapy Evaluation Patient Details Name: [name] [name] MRN: [id number] DOB: November 21, 1970 Today's Date: 06/05/2013   History of Present Illness  43 y/o female admitted w/ dx: Aspiration pneumonia, AMS after being found unresponsive by her son in the bathroom. See pt PMH in chart.  Clinical Impression  Pt adm due to the above. Presents with decreased independence with mobility and increased amount of (A) needed for be mobility due to dizziness and deficits indicated below. Pt to benefit from skilled acute PT to address deficits and maximize functional mobility prior to D/C home with family.     Follow Up Recommendations Home health PT;Supervision/Assistance - 24 hour    Equipment Recommendations  None recommended by PT    Recommendations for Other Services OT consult     Precautions / Restrictions Precautions Precautions: Fall Precaution Comments: pt with difficulty tracking; nystagmus noted  Restrictions Weight Bearing Restrictions: No      Mobility  Bed Mobility Overal bed mobility: Needs Assistance Bed Mobility: Supine to Sit;Sit to Supine     Supine to sit: Min assist;HOB elevated Sit to supine: Min assist   General bed mobility comments: increased time due to dizziness; ataxic like movements; follows commands with incr time; cues for sequencing; min (A) to elevate trunk and to (A) Les due to ataxic like movements   Transfers Overall transfer level: Needs assistance Equipment used: 2 person hand held assist Transfers: Sit to/from Stand Sit to Stand: +2 physical assistance;Min assist         General transfer comment: 2 person (A) with HHA for safety; pt ataxic and with posterior bias; max cues for safety; required sitting rest break due to dizziness; max cues for safety    Ambulation/Gait Ambulation/Gait assistance: Min assist;+2 physical assistance Ambulation Distance (Feet): 3 Feet (lateral steps to [facility]) Assistive device: 2 person hand held  assist Gait Pattern/deviations:  (lateral steps)     General Gait Details: pt able to take lateral steps to [facility] with 2 person (A) for safety; pt with ataxic like movements with lateral steps   Stairs            Wheelchair Mobility    Modified Rankin (Stroke Patients Only)       Balance Overall balance assessment: Needs assistance;History of Falls Sitting-balance support: Feet supported;Bilateral upper extremity supported Sitting balance-Leahy Scale: Poor Sitting balance - Comments: swaying sitting EOB  Postural control: Posterior lean Standing balance support: During functional activity;Bilateral upper extremity supported Standing balance-Leahy Scale: Poor Standing balance comment: required HHA and +sway; unsteady; tolerated static standing ~10 seconds                              Pertinent Vitals/Pain No c/o pain     Home Living Family/patient expects to be discharged to:: Private residence Living Arrangements: Children Available Help at Discharge: Family;Available 24 hours/day Type of Home: Apartment Home Access: Stairs to enter Entrance Stairs-Rails: Right;Left Entrance Stairs-Number of Steps: 14 Home Layout: One level Home Equipment: Walker - 2 wheels;Cane - single point Additional Comments: pt poor historian and no family present; PLOF and social history from OT note     Prior Function Level of Independence: Needs assistance   Gait / Transfers Assistance Needed: short distance amb in house with RW.  ADL's / Homemaking Assistance Needed: Assist with bathing (per pt report ?2x/week), meals and housekeeping.        Hand Dominance   Dominant Hand: Right  Extremity/Trunk Assessment   Upper Extremity Assessment: Defer to OT evaluation           Lower Extremity Assessment: Overall WFL for tasks assessed RLE Deficits / Details: ataxic like moevement;s was able to weightbear and grossly 3+/5 for bed mobility     Cervical / Trunk  Assessment: Normal  Communication   Communication: Other (comment) (dysarthria; slurred speech)  Cognition Arousal/Alertness: Awake/alert Behavior During Therapy: Anxious Overall Cognitive Status: History of cognitive impairments - at baseline Area of Impairment: Orientation;Memory;Following commands;Safety/judgement;Problem solving Orientation Level: Disoriented to;Time;Situation   Memory: Decreased short-term memory Following Commands: Follows one step commands with increased time Safety/Judgement: Decreased awareness of safety   Problem Solving: Slow processing;Decreased initiation;Difficulty sequencing;Requires verbal cues;Requires tactile cues      General Comments      Exercises        Assessment/Plan    PT Assessment Patient needs continued PT services  PT Diagnosis Difficulty walking   PT Problem List Decreased balance;Decreased mobility;Decreased cognition;Decreased safety awareness;Decreased activity tolerance;Impaired sensation  PT Treatment Interventions DME instruction;Gait training;Functional mobility training;Stair training;Therapeutic activities;Therapeutic exercise;Patient/family education;Balance training;Neuromuscular re-education   PT Goals (Current goals can be found in the Care Plan section) Acute Rehab PT Goals Patient Stated Goal: get moving more  PT Goal Formulation: With patient Time For Goal Achievement: 06/12/13 Potential to Achieve Goals: Fair    Frequency Min 3X/week   Barriers to discharge        Co-evaluation               End of Session Equipment Utilized During Treatment: Gait belt Activity Tolerance: Other (comment) (dizziness) Patient left: in bed;with call bell/phone within reach;with bed alarm set;with nursing/sitter in room Nurse Communication: Mobility status         Time: [id number] PT Time Calculation (min): 17 min   Charges:   PT Evaluation $Initial PT Evaluation Tier I: 1 Procedure PT  Treatments $Therapeutic Activity: 8-22 mins   PT G CodesNadara Mustard:          Dowell Hoon N [place], [place]PT  [phone] 06/05/2013, 3:18 PM

## 2013-06-05 NOTE — Evaluation (Signed)
Clinical/Bedside Swallow Evaluation Patient Details  Name: GRAYCIN MARCUCCI MRN: [id number] Date of Birth: [birth year]  Today's Date: 06/05/2013 Time: 1100-1130 SLP Time Calculation (min): 30 min  Past Medical History:  Past Medical History  Diagnosis Date  . Multiple sclerosis   . HYPERTENSION 09/30/2006  . DIABETES MELLITUS, TYPE II 09/30/2006  . TIA (transient ischemic attack)   . TOBACCO USER 09/30/2006    Qualifier: Diagnosis of  By: Delrae Alfred MD, Lanora Manis    . Multiple sclerosis, relapsing-remitting 05/29/2012  . DYSLIPIDEMIA 09/30/2006    Qualifier: Diagnosis of  By: Delrae Alfred MD, Lanora Manis    . DIABETES MELLITUS, TYPE II 09/30/2006    Qualifier: Diagnosis of  By: Delrae Alfred MD, Lanora Manis    . BENIGN POSITIONAL VERTIGO 01/27/2010    Qualifier: Diagnosis of  By: Delrae Alfred MD, Lanora Manis    . Stroke    Past Surgical History: History reviewed. No pertinent past surgical history. HPI:  Vernis PAM FINNIGAN is a 43 y.o. female who is brought in to the ED after an episode of unresponsiveness and aspiration. Patient who has a history of MS, was fine this morning when son left her, when he came back she was slumped over the commode and unresponsive for at least 1 min, gurgling on vomit, so he called EMS. Question if pna also present prior to vomiting event. Neurologist reports MRI of her brain today showed severe diffuse white matter changes. No new areas of demyelination were noted.   Assessment / Plan / Recommendation Clinical Impression  Pt did not demonstrate any evidence of aspiration over 16 oz of water intake. Mastication of solids is functional. Pt denies any difficutly swallowing or history of pna prior to this event. She does have mild dysarthria with discoordination and slow rate. Would recommend pt continue with current diet, regular thin. Pt could f/u with outpatient SLP to work on dysarthria, but no f/u needed for swallowing at this time.     Aspiration Risk  Mild    Diet Recommendation  Regular;Thin liquid   Liquid Administration via: Cup;Straw Medication Administration: Whole meds with liquid Supervision: Patient able to self feed Postural Changes and/or Swallow Maneuvers: Seated upright 90 degrees    Other  Recommendations Oral Care Recommendations: Oral care BID   Follow Up Recommendations  Outpatient SLP (for dysarthria)    Frequency and Duration        Pertinent Vitals/Pain NA    SLP Swallow Goals     Swallow Study Prior Functional Status  Type of Home: Apartment Available Help at Discharge: Family;Available 24 hours/day    General HPI: Asyah TYCEE BLAMER is a 43 y.o. female who is brought in to the ED after an episode of unresponsiveness and aspiration. Patient who has a history of MS, was fine this morning when son left her, when he came back she was slumped over the commode and unresponsive for at least 1 min, gurgling on vomit, so he called EMS. Question if pna also present prior to vomiting event. Neurologist reports MRI of her brain today showed severe diffuse white matter changes. No new areas of demyelination were noted. Type of Study: Bedside swallow evaluation Previous Swallow Assessment: none Diet Prior to this Study: Regular;Thin liquids Temperature Spikes Noted: No Respiratory Status: Room air History of Recent Intubation: No Behavior/Cognition: Alert;Cooperative;Pleasant mood Oral Cavity - Dentition: Adequate natural dentition Self-Feeding Abilities: Able to feed self Patient Positioning: Upright in bed Baseline Vocal Quality: Clear Volitional Cough: Strong Volitional Swallow: Able to elicit    Oral/Motor/Sensory  Function Overall Oral Motor/Sensory Function: Other (comment) (strength WNL, slow slight )   Ice Chips     Thin Liquid Thin Liquid: Within functional limits Presentation: Cup;Straw;Self Fed    Nectar Thick Nectar Thick Liquid: Not tested   Honey Thick Honey Thick Liquid: Not tested   Puree Puree: Within functional limits    Solid   GO    Solid: Within functional limits      Downtown Endoscopy CenterBonnie Haston [place], [place]MA CCC-SLP [phone]  Riley NearingBonnie Caroline Alik Mawson 06/05/2013,1:44 PM

## 2013-06-05 NOTE — Progress Notes (Signed)
Speech eval no problems swallowing at this time.  Just slow.

## 2013-06-05 NOTE — Evaluation (Addendum)
Occupational Therapy Evaluation Patient Details Name: [name] [name] MRN: [id number] DOB: December 13, 1970 Today's Date: 06/05/2013    History of Present Illness 43 y/o female admitted w/ dx: Aspiration pneumonia, AMS after being found unresponsive by her son in the bathroom. See pt PMH in chart.   Clinical Impression   Pt currently demonstrates deficits/impaired ADL performeance and dx aspiration PNA, AMS, no family present during assessment to assist w/ baseline level of function. Pt should benefit from acute OT to assist in maximizing independence w/ functional transfers, ADL's. ?SNF rehab (no family present) vs HHOT w/ 24/7 assist.    Follow Up Recommendations  SNF;Supervision/Assistance - 24 hour (No family present to discuss & pt unable)    Equipment Recommendations  Other (comment) (Cont to assess)    Recommendations for Other Services       Precautions / Restrictions Precautions Precautions: Fall Restrictions Weight Bearing Restrictions: No      Mobility Bed Mobility Overal bed mobility: Needs Assistance Bed Mobility: Supine to Sit;Sit to Supine     Supine to sit: Supervision;HOB elevated Sit to supine: Supervision;Min guard;HOB elevated   General bed mobility comments: verbal cues, increased time to perform, denies dizziness upon sitting up, HOB elevated after pt back in bed per her preference.  Transfers Overall transfer level: Needs assistance Equipment used: 1 person hand held assist Transfers: Sit to/from UGI CorporationStand;Stand Pivot Transfers Sit to Stand: Mod assist;Max assist Stand pivot transfers: Mod assist;Max assist       General transfer comment: verbal cues for safe hand placement, assist to rise and steady as well as control descent, difficulty advancing LE's    Balance Overall balance assessment: Needs assistance;History of Falls Sitting-balance support: Feet supported;Bilateral upper extremity supported Sitting balance-Leahy Scale: Fair     Standing  balance support: Bilateral upper extremity supported;During functional activity Standing balance-Leahy Scale: Poor                              ADL Overall ADL's : Needs assistance/impaired     Grooming: Minimal assistance;Sitting (Sitting up in bed)   Upper Body Bathing: Minimal assitance;Bed level   Lower Body Bathing: Bed level;Moderate assistance   Upper Body Dressing : Minimal assistance;Sitting (Sitting up in bed)   Lower Body Dressing: Sit to/from stand;Maximal assistance   Toilet Transfer: Maximal assistance;Stand-pivot;BSC (SPT using bed rail)   Toileting- Clothing Manipulation and Hygiene: Moderate assistance;Cueing for safety;Cueing for sequencing;Sit to/from stand       Functional mobility during ADLs: Moderate assistance;Maximal assistance (Pt uses RW at home, performed SPT from EOB to 3:1 at bedside today w/ physical assist under arms/2 hand assist) General ADL Comments: Pt currently demonstrates deficits/impaired ADL performeance and dx aspiration PNA, AMS, no family present during assessment to assist w/ baseline level of function. Pt participated in ADL releated to toileting, functional mobility/transfer to 3:1 at bedside, bed mobility. Pt also educated verbally in role of OT, and discussed POC/goals, pt confused w/ this secondary to AMS.      Vision  Pt reports she does not wear glasses "I need some though"                   Perception     Praxis      Pertinent Vitals/Pain Denies pain     Hand Dominance Right   Extremity/Trunk Assessment Upper Extremity Assessment Upper Extremity Assessment: Overall WFL for tasks assessed   Lower Extremity Assessment Lower Extremity Assessment: Defer  to PT evaluation       Communication Communication Communication: Other (comment) (Dysarthria, slow slurred speech)   Cognition Arousal/Alertness: Awake/alert Behavior During Therapy: WFL for tasks assessed/performed Overall Cognitive Status:  Impaired/Different from baseline ("!920?" for year) Area of Impairment:  (AMS)                                  Home Living Family/patient expects to be discharged to:: Private residence Living Arrangements: Children Available Help at Discharge: Family;Available 24 hours/day Type of Home: Apartment Home Access: Stairs to enter Entrance Stairs-Number of Steps: 14 Entrance Stairs-Rails: Right;Left Home Layout: One level         Firefighter: Standard     Home Equipment: Environmental consultant - 2 wheels;Cane - single point          Prior Functioning/Environment Level of Independence: Needs assistance  Gait / Transfers Assistance Needed: short distance amb in house with RW. ADL's / Homemaking Assistance Needed: Assist with bathing (per pt report ?2x/week), meals and housekeeping. Communication / Swallowing Assistance Needed: Pt with noted slurred speech, slow to respond to questions. ADmitted w/ AMS & no family present to verify. Comments: "Yeah, I've fallen and Fritzi Mandes helped me up and got me situated" Unable to confirm secondary to no family present    OT Diagnosis: Generalized weakness;Cognitive deficits   OT Problem List: Decreased activity tolerance;Impaired balance (sitting and/or standing);Decreased knowledge of precautions;Decreased knowledge of use of DME or AE;Decreased cognition;Cardiopulmonary status limiting activity   OT Treatment/Interventions: Self-care/ADL training;Energy conservation;DME and/or AE instruction;Patient/family education;Cognitive remediation/compensation;Therapeutic activities;Balance training    OT Goals(Current goals can be found in the care plan section) Acute Rehab OT Goals Patient Stated Goal: Get better, wait for my son to get here Time For Goal Achievement: 06/19/13 Potential to Achieve Goals: Good  OT Frequency: Min 2X/week   Barriers to D/C:                           End of Session    Activity Tolerance: Patient tolerated  treatment well Patient left: in bed;with call bell/phone within reach;with bed alarm set   Time: 1324-4010 OT Time Calculation (min): 41 min Charges:  OT General Charges $OT Visit: 1 Procedure OT Evaluation $Initial OT Evaluation Tier I: 1 Procedure OT Treatments $Self Care/Home Management : 23-37 mins G-Codes:    [name] [name] 25-Jun-2013, 10:46 AM

## 2013-06-05 NOTE — Progress Notes (Signed)
[facility] PROGRESS NOTE  [name] [name] ZOX:[id number] DOB: [birth year] DOA: 06/02/2013 PCP: Jeanann Lewandowsky, MD  Assessment/Plan: 1. Suspected Aspiration PNA 1. B infiltrates on CXR 2. Currently on vanc and unasyn 3. Afebrile overnight 4. Awaiting cultures 2. Syncope/Unrespnsiveness/Confusion 1. EEG done 5/14, c/w known MS 2. Head CT unremarkable 3. Pt reports new onset forgetfulness, improving 3. DM 1. Will cont on carb modified diet 2. Cont on SSI coverage 4. Hx MS 1. CT head unremarkable 2. MRI brain with evidence of severe MS, no new lesions. 3. Appreciate Neurology input 4. Recs for a trial of solumedrol x 3 days for presumptive MS flare 5. DVT prophylaxis 1. Heparin subQ 6. Hypokalemia 1. Resolved  Code Status: Full Family Communication: Pt in room Disposition Plan: Pending   Consultants:    Procedures:    Antibiotics:  Vancomycin 5/13>>>  Unasyn 5/13>>>  HPI/Subjective: Reports feeling better today, still generally weak.   Objective: Filed Vitals:   06/04/13 2119 06/05/13 0415 06/05/13 0818 06/05/13 1223  BP: 124/87 113/78 111/72 144/88  Pulse: 88 68 74 88  Temp: 98 F (36.7 C) 98.2 F (36.8 C) 97.9 F (36.6 C) 98.4 F (36.9 C)  TempSrc: Oral Oral Oral Oral  Resp: 16 16 18 16   Height:      Weight: 56.881 kg (125 lb 6.4 oz)     SpO2: 100% 100% 100% 100%    Intake/Output Summary (Last 24 hours) at 06/05/13 1352 Last data filed at 06/05/13 1224  Gross per 24 hour  Intake    295 ml  Output    680 ml  Net   -385 ml   Filed Weights   06/03/13 0224 06/03/13 2039 06/04/13 2119  Weight: 56.518 kg (124 lb 9.6 oz) 58.06 kg (128 lb) 56.881 kg (125 lb 6.4 oz)    Exam:   General:  Awake, in nad  Cardiovascular: regular, s1, s2  Respiratory: normal resp effort, no wheezing  Abdomen: soft, nondistended  Musculoskeletal: perfused, no clubbing   Data Reviewed: Basic Metabolic Panel:  Recent Labs Lab 06/02/13 2309  06/03/13 0322 06/04/13 0550 06/05/13 0915  NA 151* 145 140 145  K 2.4* 2.4* 4.6 3.8  CL 108 106 107 109  CO2 25 24 16* 23  GLUCOSE 109* 157* 75 106*  BUN 11 10 9 8   CREATININE 0.61 0.57 0.57 0.58  CALCIUM 9.9 9.1 8.9 9.5  MG  --  1.9  --   --    Liver Function Tests:  Recent Labs Lab 06/02/13 2309  AST 18  ALT 11  ALKPHOS 69  BILITOT 2.5*  PROT 8.1  ALBUMIN 4.6   No results found for this basename: LIPASE, AMYLASE,  in the last 168 hours No results found for this basename: AMMONIA,  in the last 168 hours CBC:  Recent Labs Lab 06/02/13 2309 06/03/13 0322 06/05/13 0915  WBC 11.0* 13.1* 6.0  NEUTROABS 9.8*  --   --   HGB 14.9 12.5 12.6  HCT 42.4 36.1 36.5  MCV 92.8 91.6 93.6  PLT 246 221 224   Cardiac Enzymes: No results found for this basename: CKTOTAL, CKMB, CKMBINDEX, TROPONINI,  in the last 168 hours BNP (last 3 results) No results found for this basename: PROBNP,  in the last 8760 hours CBG:  Recent Labs Lab 06/04/13 1200 06/04/13 1641 06/04/13 2112 06/05/13 0816 06/05/13 1220  GLUCAP 92 141* 142* 86 106*    Recent Results (from the past 240 hour(s))  CULTURE, BLOOD (ROUTINE X 2)  Status: None   Collection Time    06/02/13 11:09 PM      Result Value Ref Range Status   Specimen Description BLOOD RIGHT ARM   Final   Special Requests BOTTLES DRAWN AEROBIC AND ANAEROBIC 10CC EACH   Final   Culture  Setup Time     Final   Value: 06/03/2013 05:11     Performed at [facility]   Culture     Final   Value:        BLOOD CULTURE RECEIVED NO GROWTH TO DATE CULTURE WILL BE HELD FOR 5 DAYS BEFORE ISSUING A FINAL NEGATIVE REPORT     Performed at [facility]   Report Status PENDING   Incomplete  CULTURE, BLOOD (ROUTINE X 2)     Status: None   Collection Time    06/02/13 11:14 PM      Result Value Ref Range Status   Specimen Description BLOOD RIGHT HAND   Final   Special Requests BOTTLES DRAWN AEROBIC ONLY 10CC   Final   Culture   Setup Time     Final   Value: 06/03/2013 05:11     Performed at [facility]   Culture     Final   Value:        BLOOD CULTURE RECEIVED NO GROWTH TO DATE CULTURE WILL BE HELD FOR 5 DAYS BEFORE ISSUING A FINAL NEGATIVE REPORT     Performed at [facility]   Report Status PENDING   Incomplete  URINE CULTURE     Status: None   Collection Time    06/02/13 11:38 PM      Result Value Ref Range Status   Specimen Description URINE, RANDOM   Final   Special Requests Normal   Final   Culture  Setup Time     Final   Value: 06/03/2013 00:28     Performed at [facility] Count     Final   Value: NO GROWTH     Performed at [facility]   Culture     Final   Value: NO GROWTH     Performed at [facility]   Report Status 06/04/2013 FINAL   Final     Studies: Mr Brain Wo Contrast  06/04/2013   CLINICAL DATA:  Multiple sclerosis with relapse.  EXAM: MRI HEAD WITHOUT CONTRAST  TECHNIQUE: Multiplanar, multiecho pulse sequences of the brain and surrounding structures were obtained without intravenous contrast.  COMPARISON:  MRI with contrast 09/06/2012  FINDINGS: Severe changes of multiple sclerosis. There is severe diffuse hyperintensity throughout the cerebral white matter bilaterally. There are numerous cystic areas of demyelinization in the white matter bilaterally which are slightly smaller compared with the prior study. Demyelinating disease is noted in the central pons and brachium pontis bilaterally right greater than left, similar to the prior study.  Diffusion-weighted imaging is negative for restricted diffusion. Intravenous contrast not administered today. Multiple enhancing lesions were identified on the prior study.  There is atrophy. Ventricle size is normal and there is no shift of the midline structures. No acute infarct.  IMPRESSION: Severe changes of multiple sclerosis throughout the cerebral white matter and in the posterior fossa. Cystic  demyelination cerebral white matter bilaterally shows mild interval improvement.  No new areas of demyelinization are present. No areas of restricted diffusion are present.   Electronically Signed   By: Marlan Palau M.D.   On: 06/04/2013 14:44  Scheduled Meds: . amLODipine  10 mg Oral Daily  . ampicillin-sulbactam (UNASYN) IV  1.5 g Intravenous Q6H  . aspirin  81 mg Oral Daily  . heparin  5,000 Units Subcutaneous 3 times per day  . insulin aspart  0-15 Units Subcutaneous TID WC  . insulin aspart  0-5 Units Subcutaneous QHS  . methylPREDNISolone (SOLU-MEDROL) injection  1,000 mg Intravenous Daily  . metoprolol tartrate  25 mg Oral BID  . potassium chloride SA  20 mEq Oral Daily   Continuous Infusions:   Principal Problem:   Aspiration pneumonia Active Problems:   DIABETES MELLITUS, TYPE II   HYPERTENSION  Time spent: 35min  Jerald KiefStephen K Francisco Ostrovsky  [facility] Pager [phone]. If 7PM-7AM, please contact night-coverage at [email or web address], password [email or web address] 06/05/2013, 1:52 PM  LOS: 3 days

## 2013-06-06 DIAGNOSIS — J690 Pneumonitis due to inhalation of food and vomit: Secondary | ICD-10-CM

## 2013-06-06 DIAGNOSIS — E119 Type 2 diabetes mellitus without complications: Secondary | ICD-10-CM

## 2013-06-06 DIAGNOSIS — G35 Multiple sclerosis: Secondary | ICD-10-CM

## 2013-06-06 DIAGNOSIS — R4182 Altered mental status, unspecified: Secondary | ICD-10-CM

## 2013-06-06 LAB — GLUCOSE, CAPILLARY
GLUCOSE-CAPILLARY: 145 mg/dL — AB (ref 70–99)
GLUCOSE-CAPILLARY: 175 mg/dL — AB (ref 70–99)
Glucose-Capillary: 139 mg/dL — ABNORMAL HIGH (ref 70–99)

## 2013-06-06 NOTE — Progress Notes (Signed)
Subjective: Slightly brighter this AM  Exam: Filed Vitals:   06/06/13 0444  BP: 122/90  Pulse: 68  Temp: 97 F (36.1 C)  Resp: 18   Gen: In bed, NAD MS: Awake, alert, interactive and appropriate.  VG:KKDPT, EOMI but with markedly saccadic smooth pursuit.  Motor: moves upper extremities well, some weakness in the right > L lower extremitiy.  Sensory:intact to LT.  Cerebellar: ataxic left hand on FNF  Impression:  43 yo F with worsening of symptoms in the settin gof aspiration pna. I am not sure if PNA came first followed by worsening or worsening precipitated aspiration. No clearly new areas of demyelinatino on MRI, but I think in this setting of recently becoming non-ambulatory, I would favor steroids with the presumption of an MS flare.    Recommendations: 1)Solumedrol 1 gm daily day 2/3  2) PT,OT  Ritta Slot, MD [facility] [phone]  If 7pm- 7am, please page neurology on call as listed in AMION.

## 2013-06-06 NOTE — Progress Notes (Signed)
[facility] PROGRESS NOTE  [name] [name] MVH:[id number] DOB: 1970-11-20 DOA: 06/02/2013 PCP: Jeanann LewandowskyJEGEDE, [name], MD  Assessment/Plan: 1. Suspected Aspiration PNA 1. B infiltrates on CXR 2. Currently on vanc and unasyn 3. Afebrile overnight 4. Leukocytosis resolved 2. Syncope/Unrespnsiveness/Confusion 1. EEG done 5/14, c/w known MS 2. Head CT unremarkable 3. Pt reports new onset forgetfulness, improving 3. DM 1. Will cont on carb modified diet 2. Cont on SSI coverage 4. Hx MS 1. CT head unremarkable 2. MRI brain with evidence of severe MS, no new lesions. 3. Appreciate Neurology input 4. Recs for a trial of solumedrol x 3 days for presumptive MS flare 5. Overall, sx largely unchanged since yesterday 5. DVT prophylaxis 1. Heparin subQ 6. Hypokalemia 1. Resolved  Code Status: Full Family Communication: Pt in room Disposition Plan: Pending   Consultants:    Procedures:    Antibiotics:  Vancomycin 5/13>>>  Unasyn 5/13>>>  HPI/Subjective: No acute events.   Objective: Filed Vitals:   06/05/13 1223 06/05/13 1703 06/05/13 2036 06/06/13 0444  BP: 144/88 120/81 126/88 122/90  Pulse: 88 82 83 68  Temp: 98.4 F (36.9 C) 98.1 F (36.7 C) 98.1 F (36.7 C) 97 F (36.1 C)  TempSrc: Oral Oral Oral Oral  Resp: 16 18 18 18   Height:      Weight:   57.516 kg (126 lb 12.8 oz)   SpO2: 100% 100% 98% 96%    Intake/Output Summary (Last 24 hours) at 06/06/13 1314 Last data filed at 06/06/13 0900  Gross per 24 hour  Intake   1060 ml  Output    100 ml  Net    960 ml   Filed Weights   06/03/13 2039 06/04/13 2119 06/05/13 2036  Weight: 58.06 kg (128 lb) 56.881 kg (125 lb 6.4 oz) 57.516 kg (126 lb 12.8 oz)    Exam:   General:  Awake, in nad  Cardiovascular: regular, s1, s2  Respiratory: normal resp effort, no wheezing  Abdomen: soft, nondistended  Musculoskeletal: perfused, no clubbing   Data Reviewed: Basic Metabolic Panel:  Recent Labs Lab  06/02/13 2309 06/03/13 0322 06/04/13 0550 06/05/13 0915  NA 151* 145 140 145  K 2.4* 2.4* 4.6 3.8  CL 108 106 107 109  CO2 25 24 16* 23  GLUCOSE 109* 157* 75 106*  BUN 11 10 9 8   CREATININE 0.61 0.57 0.57 0.58  CALCIUM 9.9 9.1 8.9 9.5  MG  --  1.9  --   --    Liver Function Tests:  Recent Labs Lab 06/02/13 2309  AST 18  ALT 11  ALKPHOS 69  BILITOT 2.5*  PROT 8.1  ALBUMIN 4.6   No results found for this basename: LIPASE, AMYLASE,  in the last 168 hours No results found for this basename: AMMONIA,  in the last 168 hours CBC:  Recent Labs Lab 06/02/13 2309 06/03/13 0322 06/05/13 0915  WBC 11.0* 13.1* 6.0  NEUTROABS 9.8*  --   --   HGB 14.9 12.5 12.6  HCT 42.4 36.1 36.5  MCV 92.8 91.6 93.6  PLT 246 221 224   Cardiac Enzymes: No results found for this basename: CKTOTAL, CKMB, CKMBINDEX, TROPONINI,  in the last 168 hours BNP (last 3 results) No results found for this basename: PROBNP,  in the last 8760 hours CBG:  Recent Labs Lab 06/05/13 1220 06/05/13 1702 06/05/13 2034 06/06/13 0813 06/06/13 1157  GLUCAP 106* 168* 311* 139* 145*    Recent Results (from the past 240 hour(s))  CULTURE, BLOOD (  ROUTINE X 2)     Status: None   Collection Time    06/02/13 11:09 PM      Result Value Ref Range Status   Specimen Description BLOOD RIGHT ARM   Final   Special Requests BOTTLES DRAWN AEROBIC AND ANAEROBIC 10CC EACH   Final   Culture  Setup Time     Final   Value: 06/03/2013 05:11     Performed at [facility]   Culture     Final   Value:        BLOOD CULTURE RECEIVED NO GROWTH TO DATE CULTURE WILL BE HELD FOR 5 DAYS BEFORE ISSUING A FINAL NEGATIVE REPORT     Performed at [facility]   Report Status PENDING   Incomplete  CULTURE, BLOOD (ROUTINE X 2)     Status: None   Collection Time    06/02/13 11:14 PM      Result Value Ref Range Status   Specimen Description BLOOD RIGHT HAND   Final   Special Requests BOTTLES DRAWN AEROBIC ONLY 10CC    Final   Culture  Setup Time     Final   Value: 06/03/2013 05:11     Performed at [facility]   Culture     Final   Value:        BLOOD CULTURE RECEIVED NO GROWTH TO DATE CULTURE WILL BE HELD FOR 5 DAYS BEFORE ISSUING A FINAL NEGATIVE REPORT     Performed at [facility]   Report Status PENDING   Incomplete  URINE CULTURE     Status: None   Collection Time    06/02/13 11:38 PM      Result Value Ref Range Status   Specimen Description URINE, RANDOM   Final   Special Requests Normal   Final   Culture  Setup Time     Final   Value: 06/03/2013 00:28     Performed at [facility] Count     Final   Value: NO GROWTH     Performed at [facility]   Culture     Final   Value: NO GROWTH     Performed at [facility]   Report Status 06/04/2013 FINAL   Final     Studies: Mr Brain Wo Contrast  06/04/2013   CLINICAL DATA:  Multiple sclerosis with relapse.  EXAM: MRI HEAD WITHOUT CONTRAST  TECHNIQUE: Multiplanar, multiecho pulse sequences of the brain and surrounding structures were obtained without intravenous contrast.  COMPARISON:  MRI with contrast 09/06/2012  FINDINGS: Severe changes of multiple sclerosis. There is severe diffuse hyperintensity throughout the cerebral white matter bilaterally. There are numerous cystic areas of demyelinization in the white matter bilaterally which are slightly smaller compared with the prior study. Demyelinating disease is noted in the central pons and brachium pontis bilaterally right greater than left, similar to the prior study.  Diffusion-weighted imaging is negative for restricted diffusion. Intravenous contrast not administered today. Multiple enhancing lesions were identified on the prior study.  There is atrophy. Ventricle size is normal and there is no shift of the midline structures. No acute infarct.  IMPRESSION: Severe changes of multiple sclerosis throughout the cerebral white matter and in the posterior  fossa. Cystic demyelination cerebral white matter bilaterally shows mild interval improvement.  No new areas of demyelinization are present. No areas of restricted diffusion are present.   Electronically Signed   By: Marlan Palau M.D.  On: 06/04/2013 14:44    Scheduled Meds: . amLODipine  10 mg Oral Daily  . ampicillin-sulbactam (UNASYN) IV  1.5 g Intravenous Q6H  . aspirin  81 mg Oral Daily  . heparin  5,000 Units Subcutaneous 3 times per day  . insulin aspart  0-15 Units Subcutaneous TID WC  . insulin aspart  0-5 Units Subcutaneous QHS  . methylPREDNISolone (SOLU-MEDROL) injection  1,000 mg Intravenous Daily  . metoprolol tartrate  25 mg Oral BID  . potassium chloride SA  20 mEq Oral Daily   Continuous Infusions:   Principal Problem:   Aspiration pneumonia Active Problems:   DIABETES MELLITUS, TYPE II   HYPERTENSION  Time spent:  [name]  [facility] Pager [phone]. If 7PM-7AM, please contact night-coverage at [email or web address], password [facility] 06/06/2013, 1:14 PM  LOS: 4 days

## 2013-06-07 LAB — BASIC METABOLIC PANEL
BUN: 14 mg/dL (ref 6–23)
CHLORIDE: 109 meq/L (ref 96–112)
CO2: 20 mEq/L (ref 19–32)
CREATININE: 0.51 mg/dL (ref 0.50–1.10)
Calcium: 9.3 mg/dL (ref 8.4–10.5)
GFR calc non Af Amer: >90 mL/min (ref >90)
Glucose, Bld: 150 mg/dL — ABNORMAL HIGH (ref 70–99)
POTASSIUM: 4 meq/L (ref 3.7–5.3)
Sodium: 143 mEq/L (ref 137–147)

## 2013-06-07 LAB — GLUCOSE, CAPILLARY
GLUCOSE-CAPILLARY: 180 mg/dL — AB (ref 70–99)
Glucose-Capillary: 194 mg/dL — ABNORMAL HIGH (ref 70–99)
Glucose-Capillary: 152 mg/dL — ABNORMAL HIGH (ref 70–99)
Glucose-Capillary: 189 mg/dL — ABNORMAL HIGH (ref 70–99)

## 2013-06-07 NOTE — Progress Notes (Signed)
[facility] PROGRESS NOTE  [name] [name] ZOX:[id number] DOB: Mar 07, 1970 DOA: 06/02/2013 PCP: Jeanann Lewandowsky, MD  Assessment/Plan: 1. Suspected Aspiration PNA 1. B infiltrates on CXR 2. Currently on vanc and unasyn 3. Remains afebrile overnight 4. Leukocytosis resolved 2. Syncope/Unrespnsiveness/Confusion 1. EEG done 5/14, c/w known MS 2. Head CT unremarkable 3. Pt reports new onset forgetfulness, improved since admit 3. DM 1. Will cont on carb modified diet 2. Cont on SSI coverage 4. Hx MS 1. CT head unremarkable 2. MRI brain with evidence of severe MS, no new lesions. 3. Appreciate Neurology input 4. Recs for a trial of solumedrol x 3 days for presumptive MS flare 5. Strength appears improved today 5. DVT prophylaxis 1. Heparin subQ 6. Hypokalemia 1. Resolved  Code Status: Full Family Communication: Pt in room Disposition Plan: Pending   Consultants:  Neurology  Procedures:    Antibiotics:  Vancomycin 5/13>>>  Unasyn 5/13>>>  HPI/Subjective: Notes increased overall strength  Objective: Filed Vitals:   06/06/13 1300 06/06/13 1700 06/06/13 2046 06/07/13 0448  BP: 123/86 126/72 124/79 115/74  Pulse: 82 82 88 68  Temp: 97 F (36.1 C) 98.4 F (36.9 C) 98.1 F (36.7 C) 97.8 F (36.6 C)  TempSrc: Oral Oral Oral Oral  Resp: 21 22 18 19   Height:      Weight:   56.7 kg (125 lb)   SpO2: 100% 100% 100% 100%    Intake/Output Summary (Last 24 hours) at 06/07/13 1324 Last data filed at 06/07/13 1100  Gross per 24 hour  Intake    360 ml  Output    350 ml  Net     10 ml   Filed Weights   06/04/13 2119 06/05/13 2036 06/06/13 2046  Weight: 56.881 kg (125 lb 6.4 oz) 57.516 kg (126 lb 12.8 oz) 56.7 kg (125 lb)    Exam:   General:  Awake, in nad  Cardiovascular: regular, s1, s2  Respiratory: normal resp effort, no wheezing  Abdomen: soft, nondistended  Musculoskeletal: perfused, no clubbing   Data Reviewed: Basic Metabolic  Panel:  Recent Labs Lab 06/02/13 2309 06/03/13 0322 06/04/13 0550 06/05/13 0915 06/07/13 0650  NA 151* 145 140 145 143  K 2.4* 2.4* 4.6 3.8 4.0  CL 108 106 107 109 109  CO2 25 24 16* 23 20  GLUCOSE 109* 157* 75 106* 150*  BUN 11 10 9 8 14   CREATININE 0.61 0.57 0.57 0.58 0.51  CALCIUM 9.9 9.1 8.9 9.5 9.3  MG  --  1.9  --   --   --    Liver Function Tests:  Recent Labs Lab 06/02/13 2309  AST 18  ALT 11  ALKPHOS 69  BILITOT 2.5*  PROT 8.1  ALBUMIN 4.6   No results found for this basename: LIPASE, AMYLASE,  in the last 168 hours No results found for this basename: AMMONIA,  in the last 168 hours CBC:  Recent Labs Lab 06/02/13 2309 06/03/13 0322 06/05/13 0915  WBC 11.0* 13.1* 6.0  NEUTROABS 9.8*  --   --   HGB 14.9 12.5 12.6  HCT 42.4 36.1 36.5  MCV 92.8 91.6 93.6  PLT 246 221 224   Cardiac Enzymes: No results found for this basename: CKTOTAL, CKMB, CKMBINDEX, TROPONINI,  in the last 168 hours BNP (last 3 results) No results found for this basename: PROBNP,  in the last 8760 hours CBG:  Recent Labs Lab 06/06/13 0813 06/06/13 1157 06/06/13 1702 06/06/13 2123 06/07/13 0823  GLUCAP 139* 145* 175* 194*  152*    Recent Results (from the past 240 hour(s))  CULTURE, BLOOD (ROUTINE X 2)     Status: None   Collection Time    06/02/13 11:09 PM      Result Value Ref Range Status   Specimen Description BLOOD RIGHT ARM   Final   Special Requests BOTTLES DRAWN AEROBIC AND ANAEROBIC 10CC EACH   Final   Culture  Setup Time     Final   Value: 06/03/2013 05:11     [facility]   Culture     Final   Value:        BLOOD CULTURE RECEIVED NO GROWTH TO DATE CULTURE WILL BE HELD FOR 5 DAYS BEFORE ISSUING A FINAL NEGATIVE REPORT     [facility]   Report Status PENDING   Incomplete  CULTURE, BLOOD (ROUTINE X 2)     Status: None   Collection Time    06/02/13 11:14 PM      Result Value Ref Range Status   Specimen Description  BLOOD RIGHT HAND   Final   Special Requests BOTTLES DRAWN AEROBIC ONLY 10CC   Final   Culture  Setup Time     Final   Value: 06/03/2013 05:11     [facility]   Culture     Final   Value:        BLOOD CULTURE RECEIVED NO GROWTH TO DATE CULTURE WILL BE HELD FOR 5 DAYS BEFORE ISSUING A FINAL NEGATIVE REPORT     [facility]   Report Status PENDING   Incomplete  URINE CULTURE     Status: None   Collection Time    06/02/13 11:38 PM      Result Value Ref Range Status   Specimen Description URINE, RANDOM   Final   Special Requests Normal   Final   Culture  Setup Time     Final   Value: 06/03/2013 00:28     [facility]   [place] Count     Final   Value: NO GROWTH     [facility]   Culture     Final   Value: NO GROWTH     [facility]   Report Status 06/04/2013 FINAL   Final     Studies: No results found.  Scheduled Meds: . amLODipine  10 mg Oral Daily  . ampicillin-sulbactam (UNASYN) IV  1.5 g Intravenous Q6H  . aspirin  81 mg Oral Daily  . heparin  5,000 Units Subcutaneous 3 times per day  . insulin aspart  0-15 Units Subcutaneous TID WC  . insulin aspart  0-5 Units Subcutaneous QHS  . metoprolol tartrate  25 mg Oral BID  . potassium chloride SA  20 mEq Oral Daily   Continuous Infusions:   Principal Problem:   Aspiration pneumonia Active Problems:   DIABETES MELLITUS, TYPE II   HYPERTENSION  Time spent: 35min  Jerald KiefStephen K Abednego Yeates  [facility] Pager [phone]. If 7PM-7AM, please contact night-coverage at [email or web address], password [email or web address] 06/07/2013, 1:24 PM  LOS: 5 days

## 2013-06-07 NOTE — Progress Notes (Signed)
Subjective: States that she feels better with steroids  Exam: Filed Vitals:   06/07/13 1700  BP: 134/82  Pulse: 76  Temp: 98.1 F (36.7 C)  Resp: 19   Gen: In bed, NAD MS: Awake, alert, interactive and appropriate.  RU:EAVWUCN:PERRL, EOMI but with markedly saccadic smooth pursuit.  Motor: moves upper extremities well, some weakness in the right > L lower extremitiy.  Sensory:intact to LT.  Cerebellar: Tremor noted bilaterally  Impression:  43 yo F with worsening of symptoms in the settin gof aspiration pna. I am not sure if PNA came first followed by worsening or worsening precipitated aspiration. No clearly new areas of demyelinatino on MRI, but I think in this setting of recently becoming non-ambulatory, I would favor steroids with the presumption of an MS flare.   I suspect she has some tremor from the steroids.  Recommendations: 1)Solumedrol 1 gm daily day 3/3  2) PT,OT   Ritta SlotMcNeill Duncan Alejandro, MD [facility] [phone]  If 7pm- 7am, please page neurology on call as listed in AMION.

## 2013-06-08 DIAGNOSIS — I10 Essential (primary) hypertension: Secondary | ICD-10-CM

## 2013-06-08 DIAGNOSIS — R4182 Altered mental status, unspecified: Secondary | ICD-10-CM

## 2013-06-08 DIAGNOSIS — A419 Sepsis, unspecified organism: Secondary | ICD-10-CM

## 2013-06-08 DIAGNOSIS — G35 Multiple sclerosis: Secondary | ICD-10-CM

## 2013-06-08 DIAGNOSIS — J690 Pneumonitis due to inhalation of food and vomit: Secondary | ICD-10-CM

## 2013-06-08 DIAGNOSIS — E119 Type 2 diabetes mellitus without complications: Secondary | ICD-10-CM

## 2013-06-08 LAB — GLUCOSE, CAPILLARY
Glucose-Capillary: 153 mg/dL — ABNORMAL HIGH (ref 70–99)
Glucose-Capillary: 175 mg/dL — ABNORMAL HIGH (ref 70–99)
Glucose-Capillary: 135 mg/dL — ABNORMAL HIGH (ref 70–99)

## 2013-06-08 LAB — GRAM STAIN: SPECIAL REQUESTS: NORMAL

## 2013-06-08 MED ORDER — SODIUM CHLORIDE 0.9 % IV SOLN
1.5000 g | Freq: Four times a day (QID) | INTRAVENOUS | Status: DC
  Administered 2013-06-08 (×3): 1.5 g via INTRAVENOUS
  Filled 2013-06-08 (×5): qty 1.5

## 2013-06-08 MED ORDER — AMOXICILLIN-POT CLAVULANATE 875-125 MG PO TABS: 1.0000 | ORAL_TABLET | Freq: Two times a day (BID) | ORAL | Status: DC

## 2013-06-08 NOTE — Discharge Summary (Addendum)
Physician Discharge Summary  [name] [name] EYC:[id number] DOB: [birth year] DOA: 06/02/2013  PCP: Jeanann Lewandowsky, MD  Admit date: 06/02/2013 Discharge date: 06/08/2013  Time spent: 40 minutes  Recommendations for Outpatient Follow-up:  1. Follow up with PCP in 1-2 weeks 2. Follow up with Neurology at next available appointment  Discharge Diagnoses:  Principal Problem:   Sepsis Active Problems:   DIABETES MELLITUS, TYPE II   HYPERTENSION   Multiple sclerosis   Aspiration pneumonia   Discharge Condition: Improved  Diet recommendation: Diabetic  Filed Weights   06/05/13 2036 06/06/13 2046 06/07/13 2031  Weight: 57.516 kg (126 lb 12.8 oz) 56.7 kg (125 lb) 57.924 kg (127 lb 11.2 oz)    History of present illness:  [name] [name] is a 43 y.o. female who is brought in to the ED after an episode of unresponsiveness and aspiration. Patient who has a history of MS, was fine this morning when son left her, when he came back she was slumped over the commode and unresponsive for at least 1 min, gurgling on vomit, so he called EMS.  She had no cough earlier today, was running a temperature of 99 per son when home nursing took it, but their [facility] is not working at home. She had no complaints earlier today and has no complaints at this time though her mental status is still noted to be off per son though much improved from her unresponsive episode and close to baseline overall.  Hospital Course:  1. Suspected Aspiration PNA with sepsis 1. B infiltrates on CXR 2. Continued on vanc and unasyn 3. Leukocytosis resolved 4. No longer septic by day of discharge 5. Pt to complete course of abx with augmentin on d/c 2. Syncope/Unrespnsiveness/Confusion  1. EEG done 5/14, c/w known MS 2. Head CT unremarkable 3. Pt reports new onset forgetfulness, improved since admit 3. DM  1. Will cont on carb modified diet 2. Cont on SSI coverage 4. Hx MS  1. CT head unremarkable 2. MRI brain with  evidence of severe MS, no new lesions. 3. Appreciate Neurology input 4. Recs for a trial of solumedrol x 3 days for presumptive MS flare 5. Strength appears to have improved since starting steroids 6. PT recommendations for home health/PT 7. Discussed with Neurology and patient is stable and safe for discharge home with home PT 5. DVT prophylaxis  1. Heparin subQ 6. Hypokalemia  1. Resolved  Consultations:  Neurology  Discharge Exam: Filed Vitals:   06/07/13 2031 06/08/13 0433 06/08/13 0821 06/08/13 1618  BP: 126/83 119/76 122/83 116/78  Pulse: 76 61 65 72  Temp: 98.3 F (36.8 C) 97.6 F (36.4 C) 98 F (36.7 C) 98.6 F (37 C)  TempSrc: Oral Oral Oral Oral  Resp: 18 18 18 18   Height: 5\' 2"  (1.575 m)     Weight: 57.924 kg (127 lb 11.2 oz)     SpO2: 99% 100% 98% 99%    General: Awake, in nad Cardiovascular: regular, s1, s2 Respiratory: normal resp effort, no wheezing  Discharge Instructions     Medication List         amLODipine 10 MG tablet  Commonly known as:  NORVASC  Take 1 tablet (10 mg total) by mouth daily.     amoxicillin-clavulanate 875-125 MG per tablet  Commonly known as:  AUGMENTIN  Take 1 tablet by mouth 2 (two) times daily.     aspirin 81 MG tablet  Take 1 tablet (81 mg total) by mouth daily.  HYDROcodone-acetaminophen 5-325 MG per tablet  Commonly known as:  NORCO/VICODIN  Take 1-2 tablets by mouth every 4 (four) hours as needed for moderate pain.     meclizine 25 MG tablet  Commonly known as:  ANTIVERT  Take 1 tablet (25 mg total) by mouth 3 (three) times daily as needed for dizziness or nausea.     metFORMIN 500 MG tablet  Commonly known as:  GLUCOPHAGE  Take 1 tablet (500 mg total) by mouth daily with breakfast.     metoprolol tartrate 25 MG tablet  Commonly known as:  LOPRESSOR  Take 25 mg by mouth 2 (two) times daily.     potassium chloride SA 20 MEQ tablet  Commonly known as:  K-DUR,KLOR-CON  Take 1 tablet (20 mEq total) by  mouth daily.       No Known Allergies Follow-up Information   Follow up with [name], [name], MD. Schedule an appointment as soon as possible for a visit in 1 week.   Specialty:  Internal Medicine   Contact information:   [address] [place] [address] [phone]       Follow up with [facility]. Schedule an appointment as soon as possible for a visit in 1 week.   Contact information:   [address] [place] [address] [phone]       The results of significant diagnostics from this hospitalization (including imaging, microbiology, ancillary and laboratory) are listed below for reference.    Significant Diagnostic Studies: Ct Head Wo Contrast  06/03/2013   CLINICAL DATA:  Altered mental status. History of multiple sclerosis.  EXAM: CT HEAD WITHOUT CONTRAST  TECHNIQUE: Contiguous axial images were obtained from the base of the skull through the vertex without intravenous contrast.  COMPARISON:  MR HEAD WO/W CM dated 09/06/2012; CT HEAD W/O CM dated 09/06/2012  FINDINGS: Mild ventriculomegaly, likely on the basis of global parenchymal brain volume loss as there is overall commensurate enlargement of cerebral sulci and cerebellar folia, advanced for age though, stable from prior MRI. Moderate to severe confluent white matter hypodensities are similar to prior examination without midline shift, mass effect, intraparenchymal hemorrhage nor acute large vascular territory infarct. Right basal ganglia perivascular space.  No abnormal extra-axial fluid collections. Basal cisterns are patent.  Visualized paranasal sinuses and mastoid air cells are well aerated. No skull fracture.  IMPRESSION: No acute intracranial process.  Moderate to severe white matter changes similar to prior examination compatible with patient's history of demyelination. Mild global parenchymal brain volume loss for age.   Electronically Signed   By: Awilda Metro   On: 06/03/2013 00:39    Mr Brain Wo Contrast  06/04/2013   CLINICAL DATA:  Multiple sclerosis with relapse.  EXAM: MRI HEAD WITHOUT CONTRAST  TECHNIQUE: Multiplanar, multiecho pulse sequences of the brain and surrounding structures were obtained without intravenous contrast.  COMPARISON:  MRI with contrast 09/06/2012  FINDINGS: Severe changes of multiple sclerosis. There is severe diffuse hyperintensity throughout the cerebral white matter bilaterally. There are numerous cystic areas of demyelinization in the white matter bilaterally which are slightly smaller compared with the prior study. Demyelinating disease is noted in the central pons and brachium pontis bilaterally right greater than left, similar to the prior study.  Diffusion-weighted imaging is negative for restricted diffusion. Intravenous contrast not administered today. Multiple enhancing lesions were identified on the prior study.  There is atrophy. Ventricle size is normal and there is no shift of the midline structures. No  acute infarct.  IMPRESSION: Severe changes of multiple sclerosis throughout the cerebral white matter and in the posterior fossa. Cystic demyelination cerebral white matter bilaterally shows mild interval improvement.  No new areas of demyelinization are present. No areas of restricted diffusion are present.   Electronically Signed   By: Marlan Palau M.D.   On: 06/04/2013 14:44   Dg Chest Port 1 View  06/02/2013   CLINICAL DATA:  Emesis, altered mental status  EXAM: PORTABLE CHEST - 1 VIEW  COMPARISON:  05/09/2013  FINDINGS: Cardiac shadow is within normal limits. The lungs are well aerated bilaterally. Increased density is noted in the bases bilaterally consistent with bibasilar infiltrates. This slightly more prominent on the left than the right. No other focal abnormality is seen.  IMPRESSION: Bibasilar infiltrates left greater than right.   Electronically Signed   By: Alcide Clever M.D.   On: 06/02/2013 23:13   Dg Abd Acute  W/chest  05/09/2013   CLINICAL DATA:  Weakness, abdominal pain.  EXAM: ACUTE ABDOMEN SERIES (ABDOMEN 2 VIEW & CHEST 1 VIEW)  COMPARISON:  05/27/2012  FINDINGS: There is no evidence of dilated bowel loops or free intraperitoneal air. No radiopaque calculi or other significant radiographic abnormality is seen. Heart size and mediastinal contours are within normal limits. Both lungs are clear.  IMPRESSION: Negative abdominal radiographs.  No acute cardiopulmonary disease.   Electronically Signed   By: Charlett Nose M.D.   On: 05/09/2013 22:39    Microbiology: Recent Results (from the past 240 hour(s))  CULTURE, BLOOD (ROUTINE X 2)     Status: None   Collection Time    06/02/13 11:09 PM      Result Value Ref Range Status   Specimen Description BLOOD RIGHT ARM   Final   Special Requests BOTTLES DRAWN AEROBIC AND ANAEROBIC 10CC EACH   Final   Culture  Setup Time     Final   Value: 06/03/2013 05:11     Performed at [facility]   Culture     Final   Value:        BLOOD CULTURE RECEIVED NO GROWTH TO DATE CULTURE WILL BE HELD FOR 5 DAYS BEFORE ISSUING A FINAL NEGATIVE REPORT     Performed at [facility]   Report Status PENDING   Incomplete  CULTURE, BLOOD (ROUTINE X 2)     Status: None   Collection Time    06/02/13 11:14 PM      Result Value Ref Range Status   Specimen Description BLOOD RIGHT HAND   Final   Special Requests BOTTLES DRAWN AEROBIC ONLY 10CC   Final   Culture  Setup Time     Final   Value: 06/03/2013 05:11     Performed at [facility]   Culture     Final   Value:        BLOOD CULTURE RECEIVED NO GROWTH TO DATE CULTURE WILL BE HELD FOR 5 DAYS BEFORE ISSUING A FINAL NEGATIVE REPORT     Performed at [facility]   Report Status PENDING   Incomplete  URINE CULTURE     Status: None   Collection Time    06/02/13 11:38 PM      Result Value Ref Range Status   Specimen Description URINE, RANDOM   Final   Special Requests Normal   Final   Culture   Setup Time     Final   Value: 06/03/2013 00:28     Performed  at [facility]   Colony Count     Final   Value: NO GROWTH     [facility]formed at [facility]   Culture     Final   Value: NO GROWTH     Performed at [facility]   Report Status 06/04/2013 FINAL   Final  GRAM STAIN     Status: None   Collection Time    06/07/13 10:50 PM      Result Value Ref Range Status   Specimen Description URINE, RANDOM   Final   Special Requests Normal   Final   Gram Stain     Final   Value: CYTOSPIN     WBC PRESENT,BOTH PMN AND MONONUCLEAR     YEAST     SQUAMOUS EPITHELIAL CELLS PRESENT   Report Status 06/08/2013 FINAL   Final     Labs: Basic Metabolic Panel:  Recent Labs Lab 06/02/13 2309 06/03/13 0322 06/04/13 0550 06/05/13 0915 06/07/13 0650  NA 151* 145 140 145 143  K 2.4* 2.4* 4.6 3.8 4.0  CL 108 106 107 109 109  CO2 25 24 16* 23 20  GLUCOSE 109* 157* 75 106* 150*  BUN 11 10 9 8 14   CREATININE 0.61 0.57 0.57 0.58 0.51  CALCIUM 9.9 9.1 8.9 9.5 9.3  MG  --  1.9  --   --   --    Liver Function Tests:  Recent Labs Lab 06/02/13 2309  AST 18  ALT 11  ALKPHOS 69  BILITOT 2.5*  PROT 8.1  ALBUMIN 4.6   No results found for this basename: LIPASE, AMYLASE,  in the last 168 hours No results found for this basename: AMMONIA,  in the last 168 hours CBC:  Recent Labs Lab 06/02/13 2309 06/03/13 0322 06/05/13 0915  WBC 11.0* 13.1* 6.0  NEUTROABS 9.8*  --   --   HGB 14.9 12.5 12.6  HCT 42.4 36.1 36.5  MCV 92.8 91.6 93.6  PLT 246 221 224   Cardiac Enzymes: No results found for this basename: CKTOTAL, CKMB, CKMBINDEX, TROPONINI,  in the last 168 hours BNP: BNP (last 3 results) No results found for this basename: PROBNP,  in the last 8760 hours CBG:  Recent Labs Lab 06/07/13 0823 06/07/13 1659 06/07/13 2027 06/08/13 0820 06/08/13 1139  GLUCAP 152* 180* 189* 153* 175*    Signed:  Jerald KiefStephen K Corderro Koloski  [facility] 06/08/2013, 4:29 PM

## 2013-06-08 NOTE — Progress Notes (Signed)
Physical Therapy Treatment Patient Details Name: Georgeanna LeaDeirdre K Kennington MRN: [id number] DOB: [birth year] Today's Date: 06/08/2013    History of Present Illness 43 y/o female with history of MS, BPPV, admitted w/ dx: Aspiration pneumonia, AMS after being found unresponsive by her son in the bathroom. See pt PMH in chart.    PT Comments    Making progress with mobility and amb distance/activity tolerance;   Will need a clearer picture of available home assist, in terms of -- if pt "bumps" up her flight of steps to access her bedroom at home, will there be someone to assist her to stand?   Follow Up Recommendations  Home health PT;Supervision/Assistance - 24 hour     Equipment Recommendations  None recommended by PT    Recommendations for Other Services OT consult     Precautions / Restrictions Precautions Precautions: Fall Precaution Comments: pt with difficulty tracking; nystagmus noted     Mobility  Bed Mobility Overal bed mobility: Needs Assistance Bed Mobility: Supine to Sit     Supine to sit: Min assist     General bed mobility comments: Smoother transition today; used bedrails  Transfers Overall transfer level: Needs assistance Equipment used: Rolling walker (2 wheeled) Transfers: Sit to/from [employer];Stand Pivot Transfers Sit to Stand: Min assist;+2 safety/equipment Stand pivot transfers: Min assist;+2 safety/equipment       General transfer comment: Able to transfer to bedside commode with min steady assist; Cues for technique; Able to stand to RW with cues for hand placement and safety and steadying assist  Ambulation/Gait Ambulation/Gait assistance: Min assist;+2 safety/equipment Ambulation Distance (Feet): 65 Feet Assistive device: Rolling walker (2 wheeled) Gait Pattern/deviations: Step-through pattern;Ataxic;Decreased stride length     General Gait Details: Noted unsteadiness, with incr in bil knee "shake" with incr distance; ataxic steps   Stairs             Wheelchair Mobility    Modified Rankin (Stroke Patients Only)       Balance Overall balance assessment: Needs assistance Sitting-balance support: Single extremity supported Sitting balance-Leahy Scale: Fair     Standing balance support: Bilateral upper extremity supported Standing balance-Leahy Scale: Poor                      Cognition Arousal/Alertness: Awake/alert Behavior During Therapy: WFL for tasks assessed/performed Overall Cognitive Status: Within Functional Limits for tasks assessed                      Exercises      General Comments        Pertinent Vitals/Pain no apparent distress     Home Living                      Prior Function            PT Goals (current goals can now be found in the care plan section) Acute Rehab PT Goals Patient Stated Goal: get moving more  PT Goal Formulation: With patient Time For Goal Achievement: 06/12/13 Potential to Achieve Goals: Fair Progress towards PT goals: Progressing toward goals    Frequency  Min 3X/week    PT Plan Current plan remains appropriate    Co-evaluation             End of Session Equipment Utilized During Treatment: Gait belt Activity Tolerance: Patient tolerated treatment well Patient left: in chair;with call bell/phone within reach     Time: 0850-0916 PT Time Calculation (  min): 26 min  Charges:  $Gait Training: 23-37 mins                    G Codes:      [facility] 06/08/2013, 10:07 AM  Van Clines, PT  Acute Rehabilitation Services Pager [phone] Office [phone]

## 2013-06-08 NOTE — Clinical Documentation Improvement (Signed)
Sepsis noted in ED notes but does not continue in Medical Record.   Code sepsis called secondary to fever and tachycardia  Lactic Acid level = 2.96  WBC = 11  Vancomycin and Unasyn initiated  Source of infection: aspiration pneumonia  Please clarify if you feel Sepsis: Ruled in              Ruled out     Thank You, Shellee MiloEileen T Shelly Shoultz ,RN Clinical Documentation Specialist:  [phone]  [facility]- Health Information Management

## 2013-06-08 NOTE — Progress Notes (Signed)
[name] [name] discharged Home per MD order.  Discharge instructions reviewed and discussed with the patient and son, all questions and concerns answered. Copy of instructions and scripts given to patient. IV discontinued.   Patient escorted to car by NT in a wheelchair,  no distress noted upon discharge.  [name] [name] 06/08/2013 9:00 PM

## 2013-06-09 LAB — CULTURE, BLOOD (ROUTINE X 2)
Culture: NO GROWTH
Culture: NO GROWTH

## 2013-06-09 NOTE — Care Management Note (Signed)
CARE MANAGEMENT NOTE 06/09/2013  Patient:  GATHA, JENTZEN   Account Number:  [id number]  Date Initiated:  06/03/2013  Documentation initiated by:  Darlyne Russian  Subjective/Objective Assessment:   admitted from home found unresponsive, vomited  medical hx: MS     Action/Plan:   progression of care and discharge planning   Anticipated DC Date:  06/08/2013   Anticipated DC Plan:  HOME W HOME HEALTH SERVICES         Choice offered to / List presented to:          [facility] arranged  HH-1 RN  HH-2 PT  HH-3 OT      [facility] agency  [facility]   Status of service:  Completed, signed off Medicare Important Message given?   (If response is "NO", the following Medicare IM given date fields will be blank) Date Medicare IM given:   Date Additional Medicare IM given:    Discharge Disposition:  HOME W HOME HEALTH SERVICES  Per UR Regulation:    If discussed at Long Length of Stay Meetings, dates discussed:    Comments:  06/08/2013 Pt for d/c to home will need [facility] services and per adm notes pt is active with [facility] and has a HH aide. Per son pt has [facility] for all services, this CM informed the pt son re possible PCS for this pt and gave him instructions re how he might pursue this for the pt. Johny Shock RN MPH, case manager, [phone]

## 2013-06-30 ENCOUNTER — Encounter: Payer: Self-pay | Admitting: Internal Medicine

## 2013-07-03 ENCOUNTER — Observation Stay (HOSPITAL_COMMUNITY): Payer: Medicare Other

## 2013-07-03 ENCOUNTER — Emergency Department (HOSPITAL_COMMUNITY): Payer: Medicare Other

## 2013-07-03 ENCOUNTER — Encounter (HOSPITAL_COMMUNITY): Payer: Self-pay | Admitting: Emergency Medicine

## 2013-07-03 ENCOUNTER — Inpatient Hospital Stay (HOSPITAL_COMMUNITY)
Admission: EM | Admit: 2013-07-03 | Discharge: 2013-07-09 | DRG: 059 | Disposition: A | Discharge status: Rehabilitation Facility | Payer: Medicare Other | Attending: Internal Medicine | Admitting: Internal Medicine

## 2013-07-03 DIAGNOSIS — E119 Type 2 diabetes mellitus without complications: Secondary | ICD-10-CM

## 2013-07-03 DIAGNOSIS — F32A Depression, unspecified: Secondary | ICD-10-CM

## 2013-07-03 DIAGNOSIS — K5289 Other specified noninfective gastroenteritis and colitis: Secondary | ICD-10-CM | POA: Diagnosis present

## 2013-07-03 DIAGNOSIS — E876 Hypokalemia: Secondary | ICD-10-CM

## 2013-07-03 DIAGNOSIS — H8110 Benign paroxysmal vertigo, unspecified ear: Secondary | ICD-10-CM

## 2013-07-03 DIAGNOSIS — W19XXXA Unspecified fall, initial encounter: Secondary | ICD-10-CM

## 2013-07-03 DIAGNOSIS — I10 Essential (primary) hypertension: Secondary | ICD-10-CM

## 2013-07-03 DIAGNOSIS — F17200 Nicotine dependence, unspecified, uncomplicated: Secondary | ICD-10-CM

## 2013-07-03 DIAGNOSIS — R17 Unspecified jaundice: Secondary | ICD-10-CM | POA: Diagnosis present

## 2013-07-03 DIAGNOSIS — IMO0002 Reserved for concepts with insufficient information to code with codable children: Secondary | ICD-10-CM

## 2013-07-03 DIAGNOSIS — T380X5A Adverse effect of glucocorticoids and synthetic analogues, initial encounter: Secondary | ICD-10-CM | POA: Diagnosis present

## 2013-07-03 DIAGNOSIS — S0083XA Contusion of other part of head, initial encounter: Secondary | ICD-10-CM

## 2013-07-03 DIAGNOSIS — G35 Multiple sclerosis: Principal | ICD-10-CM

## 2013-07-03 DIAGNOSIS — L0231 Cutaneous abscess of buttock: Secondary | ICD-10-CM

## 2013-07-03 DIAGNOSIS — D72829 Elevated white blood cell count, unspecified: Secondary | ICD-10-CM

## 2013-07-03 DIAGNOSIS — Z9181 History of falling: Secondary | ICD-10-CM

## 2013-07-03 DIAGNOSIS — E1142 Type 2 diabetes mellitus with diabetic polyneuropathy: Secondary | ICD-10-CM | POA: Diagnosis present

## 2013-07-03 DIAGNOSIS — K5900 Constipation, unspecified: Secondary | ICD-10-CM

## 2013-07-03 DIAGNOSIS — Z7982 Long term (current) use of aspirin: Secondary | ICD-10-CM

## 2013-07-03 DIAGNOSIS — S1093XA Contusion of unspecified part of neck, initial encounter: Secondary | ICD-10-CM

## 2013-07-03 DIAGNOSIS — Z8673 Personal history of transient ischemic attack (TIA), and cerebral infarction without residual deficits: Secondary | ICD-10-CM

## 2013-07-03 DIAGNOSIS — Z833 Family history of diabetes mellitus: Secondary | ICD-10-CM

## 2013-07-03 DIAGNOSIS — F329 Major depressive disorder, single episode, unspecified: Secondary | ICD-10-CM

## 2013-07-03 DIAGNOSIS — F39 Unspecified mood [affective] disorder: Secondary | ICD-10-CM | POA: Diagnosis present

## 2013-07-03 DIAGNOSIS — Z8349 Family history of other endocrine, nutritional and metabolic diseases: Secondary | ICD-10-CM

## 2013-07-03 DIAGNOSIS — Z87891 Personal history of nicotine dependence: Secondary | ICD-10-CM

## 2013-07-03 DIAGNOSIS — S0003XA Contusion of scalp, initial encounter: Secondary | ICD-10-CM | POA: Diagnosis present

## 2013-07-03 DIAGNOSIS — W1811XA Fall from or off toilet without subsequent striking against object, initial encounter: Secondary | ICD-10-CM | POA: Diagnosis present

## 2013-07-03 DIAGNOSIS — R29898 Other symptoms and signs involving the musculoskeletal system: Secondary | ICD-10-CM

## 2013-07-03 DIAGNOSIS — A419 Sepsis, unspecified organism: Secondary | ICD-10-CM

## 2013-07-03 DIAGNOSIS — E042 Nontoxic multinodular goiter: Secondary | ICD-10-CM | POA: Diagnosis present

## 2013-07-03 DIAGNOSIS — Z8249 Family history of ischemic heart disease and other diseases of the circulatory system: Secondary | ICD-10-CM

## 2013-07-03 DIAGNOSIS — E785 Hyperlipidemia, unspecified: Secondary | ICD-10-CM

## 2013-07-03 DIAGNOSIS — E1149 Type 2 diabetes mellitus with other diabetic neurological complication: Secondary | ICD-10-CM | POA: Diagnosis present

## 2013-07-03 HISTORY — DX: Major depressive disorder, single episode, unspecified: F32.9

## 2013-07-03 HISTORY — DX: Myoneural disorder, unspecified: G70.9

## 2013-07-03 HISTORY — DX: Depression, unspecified: F32.A

## 2013-07-03 LAB — CBC WITH DIFFERENTIAL/PLATELET
BASOS PCT: 0 % (ref 0–1)
Basophils Absolute: 0 10*3/uL (ref 0.0–0.1)
EOS ABS: 0 10*3/uL (ref 0.0–0.7)
EOS PCT: 0 % (ref 0–5)
HEMATOCRIT: 42.1 % (ref 36.0–46.0)
HEMOGLOBIN: 14.6 g/dL (ref 12.0–15.0)
LYMPHS ABS: 1.3 10*3/uL (ref 0.7–4.0)
Lymphocytes Relative: 16 % (ref 12–46)
MCH: 32.2 pg (ref 26.0–34.0)
MCHC: 34.7 g/dL (ref 30.0–36.0)
MCV: 92.9 fL (ref 78.0–100.0)
MONO ABS: 0.7 10*3/uL (ref 0.1–1.0)
MONOS PCT: 9 % (ref 3–12)
Neutro Abs: 5.8 10*3/uL (ref 1.7–7.7)
Neutrophils Relative %: 75 % (ref 43–77)
Platelets: 281 10*3/uL (ref 150–400)
RBC: 4.53 MIL/uL (ref 3.87–5.11)
RDW: 12.5 % (ref 11.5–15.5)
WBC: 7.9 10*3/uL (ref 4.0–10.5)

## 2013-07-03 LAB — URINE MICROSCOPIC-ADD ON

## 2013-07-03 LAB — GLUCOSE, CAPILLARY
GLUCOSE-CAPILLARY: 98 mg/dL (ref 70–99)
Glucose-Capillary: 93 mg/dL (ref 70–99)
Glucose-Capillary: 94 mg/dL (ref 70–99)

## 2013-07-03 LAB — RAPID URINE DRUG SCREEN, HOSP PERFORMED
Amphetamines: NOT DETECTED
BENZODIAZEPINES: NOT DETECTED
Barbiturates: NOT DETECTED
Cocaine: NOT DETECTED
Opiates: NOT DETECTED
Tetrahydrocannabinol: NOT DETECTED

## 2013-07-03 LAB — URINALYSIS, ROUTINE W REFLEX MICROSCOPIC
BILIRUBIN URINE: NEGATIVE
Glucose, UA: NEGATIVE mg/dL
KETONES UR: 15 mg/dL — AB
Leukocytes, UA: NEGATIVE
Nitrite: NEGATIVE
Protein, ur: NEGATIVE mg/dL
SPECIFIC GRAVITY, URINE: 1.018 (ref 1.005–1.030)
UROBILINOGEN UA: 1 mg/dL (ref 0.0–1.0)
pH: 6 (ref 5.0–8.0)

## 2013-07-03 LAB — COMPREHENSIVE METABOLIC PANEL
ALBUMIN: 4.5 g/dL (ref 3.5–5.2)
ALK PHOS: 91 U/L (ref 39–117)
ALT: 15 U/L (ref 0–35)
AST: 19 U/L (ref 0–37)
BUN: 8 mg/dL (ref 6–23)
CO2: 24 mEq/L (ref 19–32)
CREATININE: 0.52 mg/dL (ref 0.50–1.10)
Calcium: 10 mg/dL (ref 8.4–10.5)
Chloride: 104 mEq/L (ref 96–112)
GFR calc non Af Amer: >90 mL/min (ref >90)
Glucose, Bld: 129 mg/dL — ABNORMAL HIGH (ref 70–99)
Potassium: 2.9 mEq/L — CL (ref 3.7–5.3)
Sodium: 146 mEq/L (ref 137–147)
TOTAL PROTEIN: 7.9 g/dL (ref 6.0–8.3)
Total Bilirubin: 1.8 mg/dL — ABNORMAL HIGH (ref 0.3–1.2)

## 2013-07-03 LAB — MAGNESIUM: MAGNESIUM: 2 mg/dL (ref 1.5–2.5)

## 2013-07-03 LAB — LIPASE, BLOOD: Lipase: 46 U/L (ref 11–59)

## 2013-07-03 LAB — TSH: TSH: 0.083 u[IU]/mL — AB (ref 0.350–4.500)

## 2013-07-03 LAB — TROPONIN I: Troponin I: <0.3 ng/mL (ref <0.30)

## 2013-07-03 LAB — POC URINE PREG, ED: PREG TEST UR: NEGATIVE

## 2013-07-03 MED ORDER — LORAZEPAM BOLUS VIA INFUSION: 1.5000 mg | Freq: Once | INTRAVENOUS | Status: DC

## 2013-07-03 MED ORDER — ONDANSETRON HCL 4 MG PO TABS: 4.0000 mg | ORAL_TABLET | Freq: Four times a day (QID) | ORAL | Status: DC | PRN

## 2013-07-03 MED ORDER — POTASSIUM CHLORIDE CRYS ER 20 MEQ PO TBCR
20.0000 meq | EXTENDED_RELEASE_TABLET | Freq: Two times a day (BID) | ORAL | Status: DC
  Administered 2013-07-03 – 2013-07-05 (×4): 20 meq via ORAL
  Filled 2013-07-03 (×5): qty 1

## 2013-07-03 MED ORDER — LORAZEPAM 2 MG/ML IJ SOLN
1.5000 mg | Freq: Once | INTRAMUSCULAR | Status: AC
  Administered 2013-07-03: 1.5 mg via INTRAVENOUS
  Filled 2013-07-03: qty 1

## 2013-07-03 MED ORDER — POTASSIUM CHLORIDE 10 MEQ/100ML IV SOLN
10.0000 meq | Freq: Once | INTRAVENOUS | Status: AC
  Administered 2013-07-03: 10 meq via INTRAVENOUS
  Filled 2013-07-03: qty 100

## 2013-07-03 MED ORDER — POTASSIUM CHLORIDE CRYS ER 20 MEQ PO TBCR
40.0000 meq | EXTENDED_RELEASE_TABLET | Freq: Once | ORAL | Status: DC
  Filled 2013-07-03: qty 2

## 2013-07-03 MED ORDER — ENOXAPARIN SODIUM 40 MG/0.4ML ~~LOC~~ SOLN
40.0000 mg | SUBCUTANEOUS | Status: DC
  Administered 2013-07-03 – 2013-07-08 (×6): 40 mg via SUBCUTANEOUS
  Filled 2013-07-03 (×7): qty 0.4

## 2013-07-03 MED ORDER — METOPROLOL TARTRATE 25 MG PO TABS
25.0000 mg | ORAL_TABLET | Freq: Two times a day (BID) | ORAL | Status: DC
  Administered 2013-07-03 – 2013-07-09 (×12): 25 mg via ORAL
  Filled 2013-07-03 (×14): qty 1

## 2013-07-03 MED ORDER — SODIUM CHLORIDE 0.9 % IV BOLUS (SEPSIS)
500.0000 mL | Freq: Once | INTRAVENOUS | Status: AC
  Administered 2013-07-03: 500 mL via INTRAVENOUS

## 2013-07-03 MED ORDER — ACETAMINOPHEN 650 MG RE SUPP: 650.0000 mg | Freq: Four times a day (QID) | RECTAL | Status: DC | PRN

## 2013-07-03 MED ORDER — INSULIN ASPART 100 UNIT/ML ~~LOC~~ SOLN
0.0000 [IU] | Freq: Three times a day (TID) | SUBCUTANEOUS | Status: DC
  Administered 2013-07-04: 1 [IU] via SUBCUTANEOUS

## 2013-07-03 MED ORDER — ONDANSETRON HCL 4 MG/2ML IJ SOLN: 4.0000 mg | Freq: Four times a day (QID) | INTRAMUSCULAR | Status: DC | PRN

## 2013-07-03 MED ORDER — ACETAMINOPHEN 325 MG PO TABS: 650.0000 mg | ORAL_TABLET | Freq: Four times a day (QID) | ORAL | Status: DC | PRN

## 2013-07-03 MED ORDER — GADOBENATE DIMEGLUMINE 529 MG/ML IV SOLN
12.0000 mL | Freq: Once | INTRAVENOUS | Status: AC | PRN
  Administered 2013-07-03: 12 mL via INTRAVENOUS

## 2013-07-03 MED ORDER — AMLODIPINE BESYLATE 10 MG PO TABS
10.0000 mg | ORAL_TABLET | Freq: Every day | ORAL | Status: DC
  Administered 2013-07-03: 10 mg via ORAL
  Filled 2013-07-03 (×2): qty 1

## 2013-07-03 MED ORDER — ASPIRIN EC 81 MG PO TBEC
81.0000 mg | DELAYED_RELEASE_TABLET | Freq: Every day | ORAL | Status: DC
  Administered 2013-07-03 – 2013-07-09 (×7): 81 mg via ORAL
  Filled 2013-07-03 (×7): qty 1

## 2013-07-03 MED ORDER — MECLIZINE HCL 25 MG PO TABS
25.0000 mg | ORAL_TABLET | Freq: Three times a day (TID) | ORAL | Status: DC | PRN
  Administered 2013-07-05 – 2013-07-07 (×3): 25 mg via ORAL
  Filled 2013-07-03 (×3): qty 1

## 2013-07-03 MED ORDER — POTASSIUM CHLORIDE IN NACL 20-0.9 MEQ/L-% IV SOLN
INTRAVENOUS | Status: DC
  Administered 2013-07-03 – 2013-07-05 (×5): via INTRAVENOUS
  Filled 2013-07-03 (×7): qty 1000

## 2013-07-03 NOTE — Progress Notes (Signed)
EEG Completed; Results Pending  

## 2013-07-03 NOTE — ED Notes (Signed)
Peripheral pulses +2.

## 2013-07-03 NOTE — ED Provider Notes (Signed)
Medical screening examination/treatment/procedure(s) were conducted as a shared visit with non-physician practitioner(s) and myself.  I personally evaluated the patient during the encounter.  Hx MS.  Witnessed fall, fell face first after "missing" walker. ?LOC.  Recurrent falls recently. Aniscoria, L zygoma swelling, laceration to L upper lip on mucosal surface. Paraspinal C spine pain. Trauma imaging nonacute. Concern for MS exacerbation given recurrent falls.  BP 149/90  Pulse 90  Temp(Src) 97.8 F (36.6 C) (Oral)  Resp 18  SpO2 100%  LMP 08/23/2012    EKG Interpretation   Date/Time:  Friday July 03 2013 11:58:46 EDT Ventricular Rate:  96 PR Interval:  169 QRS Duration: 79 QT Interval:  390 QTC Calculation: 493 R Axis:   48 Text Interpretation:  Sinus rhythm Nonspecific repol abnormality, diffuse  leads Baseline wander in lead(s) V6 No significant change was found  Confirmed by Manus Gunning  MD, [place] Voytko [phone]) on 07/03/2013 12:35:44 PM       Glynn Octave, MD 07/03/13 2345

## 2013-07-03 NOTE — ED Provider Notes (Signed)
CSN: [id number]     Arrival date & time    History   First MD Initiated Contact with Patient 07/03/13 1131     Chief Complaint  Patient presents with  . Fall     (Consider location/radiation/quality/duration/timing/severity/associated sxs/prior Treatment) HPI Comments: The patient is a 43 year old female with past history of MS, TIA, vertigo, brought in by EMS to be emergency room chief complaint of witnessed fall today. Per EMS the patient was transitioning from walker to toilet, when she fell on her face and left side. The pateint's son reports a brief loss of consciousness. Per EMS the son reports 3 separate falls in the last several days, the patient was not evaluated by any practitioner. The patient reports facial injuries, and neck pain.  The patient reports increase in weakness from base line.  Baseline ambulation with a walker. PCP: Jeanann Lewandowsky, MD   Patient is a 43 y.o. female presenting with fall. The history is provided by the patient and the EMS personnel. No language interpreter was used.  Fall Associated symptoms include headaches and weakness. Pertinent negatives include no abdominal pain, chills, coughing, fever or numbness.    Past Medical History  Diagnosis Date  . Multiple sclerosis   . HYPERTENSION 09/30/2006  . DIABETES MELLITUS, TYPE II 09/30/2006  . TIA (transient ischemic attack)   . TOBACCO USER 09/30/2006    Qualifier: Diagnosis of  By: Delrae Alfred MD, Lanora Manis    . Multiple sclerosis, relapsing-remitting 05/29/2012  . DYSLIPIDEMIA 09/30/2006    Qualifier: Diagnosis of  By: Delrae Alfred MD, Lanora Manis    . DIABETES MELLITUS, TYPE II 09/30/2006    Qualifier: Diagnosis of  By: Delrae Alfred MD, Lanora Manis    . BENIGN POSITIONAL VERTIGO 01/27/2010    Qualifier: Diagnosis of  By: Delrae Alfred MD, Lanora Manis    . Stroke    History reviewed. No pertinent past surgical history. Family History  Problem Relation Age of Onset  . Hypertension Mother   . Kidney failure Mother   .  Hypertension Father   . Gout Father   . Hypertension Sister   . Diabetes Sister   . Hypertension Brother    History  Substance Use Topics  . Smoking status: Former Smoker -- 0.25 packs/day  . Smokeless tobacco: Not on file  . Alcohol Use: No   OB History   Grav Para Term Preterm Abortions TAB SAB Ect Mult Living                 Review of Systems  Constitutional: Negative for fever and chills.  HENT: Positive for facial swelling.   Respiratory: Negative for cough and shortness of breath.   Gastrointestinal: Negative for abdominal pain.  Neurological: Positive for syncope, weakness and headaches. Negative for numbness.  All other systems reviewed and are negative.     Allergies  Review of patient's allergies indicates no known allergies.  Home Medications   Prior to Admission medications   Medication Sig Start Date End Date Taking? Authorizing Provider  amLODipine (NORVASC) 10 MG tablet Take 1 tablet (10 mg total) by mouth daily. 05/25/13   Jeanann Lewandowsky, MD  amoxicillin-clavulanate (AUGMENTIN) 875-125 MG per tablet Take 1 tablet by mouth 2 (two) times daily. 06/08/13   [name], MD  aspirin 81 MG tablet Take 1 tablet (81 mg total) by mouth daily. 09/19/12   Dorothea Ogle, MD  HYDROcodone-acetaminophen (NORCO/VICODIN) 5-325 MG per tablet Take 1-2 tablets by mouth every 4 (four) hours as needed for moderate pain. 05/11/13  Dorothea Ogle, MD  meclizine (ANTIVERT) 25 MG tablet Take 1 tablet (25 mg total) by mouth 3 (three) times daily as needed for dizziness or nausea. 09/19/12   Dorothea Ogle, MD  metFORMIN (GLUCOPHAGE) 500 MG tablet Take 1 tablet (500 mg total) by mouth daily with breakfast. 05/25/13   Jeanann Lewandowsky, MD  metoprolol tartrate (LOPRESSOR) 25 MG tablet Take 25 mg by mouth 2 (two) times daily.    Historical Provider, MD  potassium chloride SA (K-DUR,KLOR-CON) 20 MEQ tablet Take 1 tablet (20 mEq total) by mouth daily. 04/30/13   Olivia Mackie, MD   LMP  08/23/2012 Physical Exam  Nursing note and vitals reviewed. Constitutional: She appears well-developed.  Non-toxic appearance. She does not have a sickly appearance. She does not appear ill. No distress. Cervical collar in place.  HENT:  Head: Normocephalic. Head is with contusion. Head is without raccoon's eyes.  Mouth/Throat: Lacerations present.  Left sided facial swelling. Small laceration to left inner upper buccal mucosa, not through and through. No obvious dental injury or dental abscess. No malocclusion.  Poor dental hygiene throughout.  Eyes: Right conjunctiva is injected. Pupils are unequal.  Cardiovascular: Regular rhythm.  Tachycardia present.   Pulmonary/Chest: Effort normal. No respiratory distress. She has no wheezes. She has no rales.  Abdominal: Soft. She exhibits no distension. There is no tenderness. There is no rebound.  Neurological: She is alert. No sensory deficit. GCS eye subscore is 4. GCS verbal subscore is 5. GCS motor subscore is 6.  Patient is oriented to place, person, does year and president does not know the day or month. Right pupil dilated, nonreactive to light. Left pupil minimally reactive to light. EOM intact. No obvious fasciola droop.  Moves all 4 extremities.  Decrease grip strength bilaterally.  Bilateral lower extremity weakness. Normal sensation to light touch bilateral upper and lower extremities.   Skin: She is not diaphoretic.    ED Course  Procedures (including critical care time) Labs Review Results for orders placed during the hospital encounter of 07/03/13  CBC WITH DIFFERENTIAL      Result Value Ref Range   WBC 7.9  4.0 - 10.5 K/uL   RBC 4.53  3.87 - 5.11 MIL/uL   Hemoglobin 14.6  12.0 - 15.0 g/dL   HCT 09.7  35.3 - 29.9 %   MCV 92.9  78.0 - 100.0 fL   MCH 32.2  26.0 - 34.0 pg   MCHC 34.7  30.0 - 36.0 g/dL   RDW 24.2  68.3 - 41.9 %   Platelets 281  150 - 400 K/uL   Neutrophils Relative % 75  43 - 77 %   Neutro Abs 5.8  1.7 - 7.7  K/uL   Lymphocytes Relative 16  12 - 46 %   Lymphs Abs 1.3  0.7 - 4.0 K/uL   Monocytes Relative 9  3 - 12 %   Monocytes Absolute 0.7  0.1 - 1.0 K/uL   Eosinophils Relative 0  0 - 5 %   Eosinophils Absolute 0.0  0.0 - 0.7 K/uL   Basophils Relative 0  0 - 1 %   Basophils Absolute 0.0  0.0 - 0.1 K/uL  COMPREHENSIVE METABOLIC PANEL      Result Value Ref Range   Sodium 146  137 - 147 mEq/L   Potassium 2.9 (*) 3.7 - 5.3 mEq/L   Chloride 104  96 - 112 mEq/L   CO2 24  19 - 32 mEq/L   Glucose,  Bld 129 (*) 70 - 99 mg/dL   BUN 8  6 - 23 mg/dL   Creatinine, Ser 1.61  0.50 - 1.10 mg/dL   Calcium 09.6  8.4 - 04.5 mg/dL   Total Protein 7.9  6.0 - 8.3 g/dL   Albumin 4.5  3.5 - 5.2 g/dL   AST 19  0 - 37 U/L   ALT 15  0 - 35 U/L   Alkaline Phosphatase 91  39 - 117 U/L   Total Bilirubin 1.8 (*) 0.3 - 1.2 mg/dL   GFR calc non Af Amer >90  >90 mL/min   GFR calc Af Amer >90  >90 mL/min  URINALYSIS, ROUTINE W REFLEX MICROSCOPIC      Result Value Ref Range   Color, Urine YELLOW  YELLOW   APPearance CLEAR  CLEAR   Specific Gravity, Urine 1.018  1.005 - 1.030   pH 6.0  5.0 - 8.0   Glucose, UA NEGATIVE  NEGATIVE mg/dL   Hgb urine dipstick SMALL (*) NEGATIVE   Bilirubin Urine NEGATIVE  NEGATIVE   Ketones, ur 15 (*) NEGATIVE mg/dL   Protein, ur NEGATIVE  NEGATIVE mg/dL   Urobilinogen, UA 1.0  0.0 - 1.0 mg/dL   Nitrite NEGATIVE  NEGATIVE   Leukocytes, UA NEGATIVE  NEGATIVE  TROPONIN I      Result Value Ref Range   Troponin I <0.30  <0.30 ng/mL  URINE MICROSCOPIC-ADD ON      Result Value Ref Range   Squamous Epithelial / LPF FEW (*) RARE   WBC, UA 0-2  <3 WBC/hpf   RBC / HPF 3-6  <3 RBC/hpf   Bacteria, UA FEW (*) RARE  POC URINE PREG, ED      Result Value Ref Range   Preg Test, Ur NEGATIVE  NEGATIVE   Dg Chest 2 View  07/03/2013   CLINICAL DATA:  Traumatic injury and pain  EXAM: CHEST  2 VIEW  COMPARISON:  06/02/2013  FINDINGS: The heart size and mediastinal contours are within normal limits.  Both lungs are clear. The visualized skeletal structures are unremarkable.  IMPRESSION: No active cardiopulmonary disease.   Electronically Signed   By: Alcide Clever M.D.   On: 07/03/2013 13:18   Ct Head Wo Contrast  07/03/2013   CLINICAL DATA:  Current history of multiple sclerosis. Patient fell forward onto the floor, face 1st, sustaining a laceration to the lip. Brief loss of consciousness.  EXAM: CT HEAD WITHOUT CONTRAST  CT MAXILLOFACIAL WITHOUT CONTRAST  CT CERVICAL SPINE WITHOUT CONTRAST  TECHNIQUE: Multidetector CT imaging of the head, cervical spine, and maxillofacial structures were performed using the standard protocol without intravenous contrast. Multiplanar CT image reconstructions of the cervical spine and maxillofacial structures were also generated.  COMPARISON:  MRI brain 06/04/2013, 09/06/2012. CT head 06/03/2013, 05/28/2012, 03/15/2010.  FINDINGS: CT HEAD FINDINGS  Patient's head is turned to the right. Ventricular system normal in size and appearance for age. Severe changes of small vessel disease of the white matter consistent with given history of MS, unchanged. No mass lesion. No midline shift. No acute hemorrhage or hematoma. No extra-axial fluid collections. No evidence of acute infarction.  No skull fracture or other focal osseous abnormality involving the skull. Bilateral mastoid air cells and middle ear cavities well aerated. Mild bilateral carotid siphon atherosclerosis.  CT MAXILLOFACIAL FINDINGS  Soft tissue swelling/ecchymosis involving the lower left cheek, overlying the left maxilla. No fractures identified involving the facial bones. Temporomandibular joints intact. Periapical lucency involving the 3rd  molar in the left side of the mandible (tooth 16).  Bony nasal septum midline. Paranasal sinuses well aerated. Left concha bullosa.  Both orbits and both globes intact.  CT CERVICAL SPINE FINDINGS  Patient's head is turned to the right, accounting for the rotation at C1-C2. No  fractures identified involving the cervical spine. Soft tissue window images demonstrate no frank disc protrusions. Sagittal reconstructed images demonstrate straightening of the usual lordosis with anatomic posterior alignment. Mild disc space narrowing at C4-5 and C5-6. Borderline spinal stenosis at C4-5. Facet joints intact throughout without significant degenerative change. Coronal reformatted images demonstrate an intact craniocervical junction, intact C1-C2 articulation, intact dens, and intact lateral masses throughout. No significant bony foraminal stenoses.  Visualized lung apices clear. Thyroid gland enlarged with multiple nodules, the largest approximating 2.4 cm.  IMPRESSION: 1. No acute intracranial abnormality. 2. Severe changes of small vessel disease of the white matter consistent with the given diagnosis of MS, not significantly changed since 2012. 3. No facial bone fractures. 4. Periapical lucency involving the 3rd left mandibular molar (tooth 16), query loosening versus abscess. 5. No cervical spine fractures. 6. Mild degenerative disc disease at C4-5 and C5-6 with borderline spinal stenosis at C4-5. 7. Multinodular goiter. Dominant 2.4 cm thyroid nodule. Consider further evaluation with nonemergent thyroid ultrasound. If patient is clinically hyperthyroid, consider nuclear medicine thyroid uptake and scan.   Electronically Signed   By: Hulan Saas M.D.   On: 07/03/2013 13:32   Ct Cervical Spine Wo Contrast  07/03/2013   CLINICAL DATA:  Current history of multiple sclerosis. Patient fell forward onto the floor, face 1st, sustaining a laceration to the lip. Brief loss of consciousness.  EXAM: CT HEAD WITHOUT CONTRAST  CT MAXILLOFACIAL WITHOUT CONTRAST  CT CERVICAL SPINE WITHOUT CONTRAST  TECHNIQUE: Multidetector CT imaging of the head, cervical spine, and maxillofacial structures were performed using the standard protocol without intravenous contrast. Multiplanar CT image reconstructions of  the cervical spine and maxillofacial structures were also generated.  COMPARISON:  MRI brain 06/04/2013, 09/06/2012. CT head 06/03/2013, 05/28/2012, 03/15/2010.  FINDINGS: CT HEAD FINDINGS  Patient's head is turned to the right. Ventricular system normal in size and appearance for age. Severe changes of small vessel disease of the white matter consistent with given history of MS, unchanged. No mass lesion. No midline shift. No acute hemorrhage or hematoma. No extra-axial fluid collections. No evidence of acute infarction.  No skull fracture or other focal osseous abnormality involving the skull. Bilateral mastoid air cells and middle ear cavities well aerated. Mild bilateral carotid siphon atherosclerosis.  CT MAXILLOFACIAL FINDINGS  Soft tissue swelling/ecchymosis involving the lower left cheek, overlying the left maxilla. No fractures identified involving the facial bones. Temporomandibular joints intact. Periapical lucency involving the 3rd molar in the left side of the mandible (tooth 16).  Bony nasal septum midline. Paranasal sinuses well aerated. Left concha bullosa.  Both orbits and both globes intact.  CT CERVICAL SPINE FINDINGS  Patient's head is turned to the right, accounting for the rotation at C1-C2. No fractures identified involving the cervical spine. Soft tissue window images demonstrate no frank disc protrusions. Sagittal reconstructed images demonstrate straightening of the usual lordosis with anatomic posterior alignment. Mild disc space narrowing at C4-5 and C5-6. Borderline spinal stenosis at C4-5. Facet joints intact throughout without significant degenerative change. Coronal reformatted images demonstrate an intact craniocervical junction, intact C1-C2 articulation, intact dens, and intact lateral masses throughout. No significant bony foraminal stenoses.  Visualized lung apices clear. Thyroid gland  enlarged with multiple nodules, the largest approximating 2.4 cm.  IMPRESSION: 1. No acute  intracranial abnormality. 2. Severe changes of small vessel disease of the white matter consistent with the given diagnosis of MS, not significantly changed since 2012. 3. No facial bone fractures. 4. Periapical lucency involving the 3rd left mandibular molar (tooth 16), query loosening versus abscess. 5. No cervical spine fractures. 6. Mild degenerative disc disease at C4-5 and C5-6 with borderline spinal stenosis at C4-5. 7. Multinodular goiter. Dominant 2.4 cm thyroid nodule. Consider further evaluation with nonemergent thyroid ultrasound. If patient is clinically hyperthyroid, consider nuclear medicine thyroid uptake and scan.   Electronically Signed   By: Hulan Saashomas  Lawrence M.D.   On: 07/03/2013 13:32   Ct Maxillofacial Wo Cm  07/03/2013   CLINICAL DATA:  Current history of multiple sclerosis. Patient fell forward onto the floor, face 1st, sustaining a laceration to the lip. Brief loss of consciousness.  EXAM: CT HEAD WITHOUT CONTRAST  CT MAXILLOFACIAL WITHOUT CONTRAST  CT CERVICAL SPINE WITHOUT CONTRAST  TECHNIQUE: Multidetector CT imaging of the head, cervical spine, and maxillofacial structures were performed using the standard protocol without intravenous contrast. Multiplanar CT image reconstructions of the cervical spine and maxillofacial structures were also generated.  COMPARISON:  MRI brain 06/04/2013, 09/06/2012. CT head 06/03/2013, 05/28/2012, 03/15/2010.  FINDINGS: CT HEAD FINDINGS  Patient's head is turned to the right. Ventricular system normal in size and appearance for age. Severe changes of small vessel disease of the white matter consistent with given history of MS, unchanged. No mass lesion. No midline shift. No acute hemorrhage or hematoma. No extra-axial fluid collections. No evidence of acute infarction.  No skull fracture or other focal osseous abnormality involving the skull. Bilateral mastoid air cells and middle ear cavities well aerated. Mild bilateral carotid siphon atherosclerosis.   CT MAXILLOFACIAL FINDINGS  Soft tissue swelling/ecchymosis involving the lower left cheek, overlying the left maxilla. No fractures identified involving the facial bones. Temporomandibular joints intact. Periapical lucency involving the 3rd molar in the left side of the mandible (tooth 16).  Bony nasal septum midline. Paranasal sinuses well aerated. Left concha bullosa.  Both orbits and both globes intact.  CT CERVICAL SPINE FINDINGS  Patient's head is turned to the right, accounting for the rotation at C1-C2. No fractures identified involving the cervical spine. Soft tissue window images demonstrate no frank disc protrusions. Sagittal reconstructed images demonstrate straightening of the usual lordosis with anatomic posterior alignment. Mild disc space narrowing at C4-5 and C5-6. Borderline spinal stenosis at C4-5. Facet joints intact throughout without significant degenerative change. Coronal reformatted images demonstrate an intact craniocervical junction, intact C1-C2 articulation, intact dens, and intact lateral masses throughout. No significant bony foraminal stenoses.  Visualized lung apices clear. Thyroid gland enlarged with multiple nodules, the largest approximating 2.4 cm.  IMPRESSION: 1. No acute intracranial abnormality. 2. Severe changes of small vessel disease of the white matter consistent with the given diagnosis of MS, not significantly changed since 2012. 3. No facial bone fractures. 4. Periapical lucency involving the 3rd left mandibular molar (tooth 16), query loosening versus abscess. 5. No cervical spine fractures. 6. Mild degenerative disc disease at C4-5 and C5-6 with borderline spinal stenosis at C4-5. 7. Multinodular goiter. Dominant 2.4 cm thyroid nodule. Consider further evaluation with nonemergent thyroid ultrasound. If patient is clinically hyperthyroid, consider nuclear medicine thyroid uptake and scan.   Electronically Signed   By: Hulan Saashomas  Lawrence M.D.   On: 07/03/2013 13:32       EKG  Interpretation   Date/Time:  Friday July 03 2013 11:58:46 EDT Ventricular Rate:  96 PR Interval:  169 QRS Duration: 79 QT Interval:  390 QTC Calculation: 493 R Axis:   48 Text Interpretation:  Sinus rhythm Nonspecific repol abnormality, diffuse  leads Baseline wander in lead(s) V6 No significant change was found  Confirmed by Manus Gunning  MD, STEPHEN [phone]) on 07/03/2013 12:35:44 PM      MDM   Final diagnoses:  Multiple sclerosis exacerbation  Hypokalemia  Facial contusion  Fall   Patient presents after multiple falls at home today. On exam shows generalized weakness, right pupil slightly dilated and minimally reactive to light. Per EMR this is a new physical exam finding. She has significant left facial swelling no obvious dental injury or nasal fracture. She reports neck pain and is in a c-collar. Will obtain CT head, face, neck. Imaging negative for fractures, shows soft tissue injuries, reevaluated and no obvious dental abscess. Will admit the patient for MS exacerbation due to multiple falls and generalized weakness. Consult to Dr. Amada Jupiter (Neurologist) who agrees to evaluate the patient in emergency apartment Discussed patient history, condition with hospitalist who agrees to admit the patient for further evaluation. Meds given in ED:  Medications  potassium chloride 10 mEq in 100 mL IVPB (10 mEq Intravenous New Bag/Given 07/03/13 1418)  potassium chloride SA (K-DUR,KLOR-CON) CR tablet 40 mEq (not administered)  sodium chloride 0.9 % bolus 500 mL (500 mLs Intravenous New Bag/Given 07/03/13 1420)    New Prescriptions   No medications on file        Clabe Seal, PA-C 07/03/13 2140

## 2013-07-03 NOTE — ED Notes (Signed)
Hx of MS, fell fwd onto carpet face first -doesn't use walker to ambulate to bathroom, LOC, cut on lip, snoring respirations, son rolled her over and she opened her eyes right away. Possible seizure but no hx of seizure. CBG124, 136/96 100% RA, P 104. PT denies HA. 20g L hand - denies neck/back pain-immobilized by EMS b/c of LOC.Hx of falls - has fallen 3 days in a row. Son tried to get mom to go to hospital over the past few days, but she wouldn't go.

## 2013-07-03 NOTE — ED Notes (Signed)
PT IV paused b/c pt complaining of burning with K+ infusion. Will reassess and restart IV when it comes back to the floor

## 2013-07-03 NOTE — ED Notes (Signed)
Lauren PA in room- R eye minimally reactive to light and PT not demonstrating L sided gaze.  MS, DM, vertigo hx. PT complaining of vertigo.

## 2013-07-03 NOTE — ED Notes (Signed)
Son states PT has hx of difficulty swallowing (w/ brushing teeth). PT's son states slow verbal response and slurring of speech before fall. PT earlier stated this was her normal speech

## 2013-07-03 NOTE — ED Notes (Signed)
Per son, PT won't really tell you whats going on and will push herself until she simply can't any longer. PT lives with son and son reports recent move to new bigger home and PT has had vertigo/nausea the past few days. Takes meclazine TID w/o relief over the past few days. Son reports hx of low K+ levels and states PT has been stiff the past few days. Falls and vertigo kind of came together after PT's physical therapy appt on 07/01/13.

## 2013-07-03 NOTE — ED Notes (Signed)
PT OTF for EEG

## 2013-07-03 NOTE — ED Notes (Signed)
Lauren PA notified of Low K+ level

## 2013-07-03 NOTE — Consult Note (Signed)
NEURO HOSPITALIST CONSULT NOTE    Reason for Consult: weakness, falls and possible MS exacerbation  HPI:                                                                                                                                          [name] [name] is an 43 y.o. female with known MS.  pateint is not a good historian and majority of information was gained from chart and son. PER son, She has been very, very weak over the last 3-4 days with N/V. He actually is unsure if she vomited due to long history of dysphagia.  Son witnessed her falling face first with possible LOC but no postictal period or TC activity. She was brought into hospital and it was noted she was confused and not acting her normal self.  On exam she is alert but unable to tell me her birth date.  She states she fell due to her LE weakness from her MS.   Per son, she has a appointment with neurologist on Monday.   Past Medical History  Diagnosis Date  . Multiple sclerosis   . HYPERTENSION 09/30/2006  . DIABETES MELLITUS, TYPE II 09/30/2006  . TIA (transient ischemic attack)   . TOBACCO USER 09/30/2006    Qualifier: Diagnosis of  By: Delrae Alfred MD, Lanora Manis    . Multiple sclerosis, relapsing-remitting 05/29/2012  . DYSLIPIDEMIA 09/30/2006    Qualifier: Diagnosis of  By: Delrae Alfred MD, Lanora Manis    . DIABETES MELLITUS, TYPE II 09/30/2006    Qualifier: Diagnosis of  By: Delrae Alfred MD, Lanora Manis    . BENIGN POSITIONAL VERTIGO 01/27/2010    Qualifier: Diagnosis of  By: Delrae Alfred MD, Lanora Manis    . Stroke     History reviewed. No pertinent past surgical history.  Family History  Problem Relation Age of Onset  . Hypertension Mother   . Kidney failure Mother   . Hypertension Father   . Gout Father   . Hypertension Sister   . Diabetes Sister   . Hypertension Brother     Social History:  reports that she has quit smoking. She does not have any smokeless tobacco history on file. She reports that she does not  drink alcohol or use illicit drugs.  No Known Allergies  MEDICATIONS:  Current Facility-Administered Medications  Medication Dose Route Frequency Provider Last Rate Last Dose  . potassium chloride 10 mEq in 100 mL IVPB  10 mEq Intravenous Once Clabe Seal, PA-C 100 mL/hr at 07/03/13 1418 10 mEq at 07/03/13 1418  . potassium chloride SA (K-DUR,KLOR-CON) CR tablet 40 mEq  40 mEq Oral Once Clabe Seal, PA-C       Current Outpatient Prescriptions  Medication Sig Dispense Refill  . amLODipine (NORVASC) 10 MG tablet Take 1 tablet (10 mg total) by mouth daily.  30 tablet  0  . aspirin EC 81 MG tablet Take 81 mg by mouth daily.      . meclizine (ANTIVERT) 25 MG tablet Take 1 tablet (25 mg total) by mouth 3 (three) times daily as needed for dizziness or nausea.  90 tablet  11  . metFORMIN (GLUCOPHAGE) 500 MG tablet Take 1 tablet (500 mg total) by mouth daily with breakfast.  60 tablet  0  . metoprolol tartrate (LOPRESSOR) 25 MG tablet Take 25 mg by mouth 2 (two) times daily.      . potassium chloride SA (K-DUR,KLOR-CON) 20 MEQ tablet Take 1 tablet (20 mEq total) by mouth daily.  30 tablet  0      ROS:                                                                                                                                       History obtained from the patient  General ROS: negative for - chills, fatigue, fever, night sweats, weight gain or weight loss Psychological ROS: negative for - behavioral disorder, hallucinations, memory difficulties, mood swings or suicidal ideation Ophthalmic ROS: negative for - blurry vision, double vision, eye pain or loss of vision ENT ROS: negative for - epistaxis, nasal discharge, oral lesions, sore throat, tinnitus or vertigo Allergy and Immunology ROS: negative for - hives or itchy/watery eyes Hematological and Lymphatic ROS: negative  for - bleeding problems, bruising or swollen lymph nodes Endocrine ROS: negative for - galactorrhea, hair pattern changes, polydipsia/polyuria or temperature intolerance Respiratory ROS: negative for - cough, hemoptysis, shortness of breath or wheezing Cardiovascular ROS: negative for - chest pain, dyspnea on exertion, edema or irregular heartbeat Gastrointestinal ROS: negative for - abdominal pain, diarrhea, hematemesis, nausea/vomiting or stool incontinence Genito-Urinary ROS: negative for - dysuria, hematuria, incontinence or urinary frequency/urgency Musculoskeletal ROS: negative for - joint swelling or muscular weakness Neurological ROS: as noted in HPI Dermatological ROS: negative for rash and skin lesion changes   Blood pressure 152/86, pulse 107, temperature 98.8 F (37.1 C), temperature source Oral, resp. rate 22, last menstrual period 08/23/2012, SpO2 100.00%.   Neurologic Examination:  Mental Status: Alert, not oriented to hospital or her birth date but later able to tell me her birthday.  Speech dysarthric without evidence of aphasia.  Able to follow simple commands without difficulty. Cranial Nerves: II: Discs flat bilaterally; Visual fields shows a left hemianopsia, pupils (right pupil is larger on the right than the left), round, reactive to light and accommodation III,IV, VI: ptosis not present, extra-ocular motions intact bilaterally V,VII: smile asymmetric due to left facial swelling, facial light touch sensation normal bilaterally VIII: hearing normal bilaterally IX,X: gag reflex present XI: bilateral shoulder shrug XII: midline tongue extension without atrophy or fasciculations  Motor: Right : Upper extremity   5/5    Left:     Upper extremity   5/5  Lower extremity   5/5     Lower extremity   4/5 --Left DF is weak Tone and bulk:normal tone throughout; no atrophy  noted Sensory: Pinprick and light touch intact throughout, bilaterally Deep Tendon Reflexes:  Right: Upper Extremity   Left: Upper extremity   biceps (C-5 to C-6) 3/4   biceps (C-5 to C-6) 3/4 tricep (C7) 3/4    triceps (C7) 3/4 Brachioradialis (C6) 3/4  Brachioradialis (C6) 3/4  Lower Extremity Lower Extremity  quadriceps (L-2 to L-4) 3/4   quadriceps (L-2 to L-4) 3/4 Achilles (S1) 4/4   Achilles (S1) 4/4  Plantars: Right: downgoing   Left: downgoing Cerebellar: normal finger-to-nose,  normal heel-to-shin test Gait: not tested due to patient safety CV: pulses palpable throughout    Lab Results: Basic Metabolic Panel:  Recent Labs Lab 07/03/13 1155  NA 146  K 2.9*  CL 104  CO2 24  GLUCOSE 129*  BUN 8  CREATININE 0.52  CALCIUM 10.0    Liver Function Tests:  Recent Labs Lab 07/03/13 1155  AST 19  ALT 15  ALKPHOS 91  BILITOT 1.8*  PROT 7.9  ALBUMIN 4.5   No results found for this basename: LIPASE, AMYLASE,  in the last 168 hours No results found for this basename: AMMONIA,  in the last 168 hours  CBC:  Recent Labs Lab 07/03/13 1155  WBC 7.9  NEUTROABS 5.8  HGB 14.6  HCT 42.1  MCV 92.9  PLT 281    Cardiac Enzymes:  Recent Labs Lab 07/03/13 1208  TROPONINI <0.30    Lipid Panel: No results found for this basename: CHOL, TRIG, HDL, CHOLHDL, VLDL, LDLCALC,  in the last 168 hours  CBG: No results found for this basename: GLUCAP,  in the last 168 hours  Microbiology: Results for orders placed during the hospital encounter of 06/02/13  CULTURE, BLOOD (ROUTINE X 2)     Status: None   Collection Time    06/02/13 11:09 PM      Result Value Ref Range Status   Specimen Description BLOOD RIGHT ARM   Final   Special Requests BOTTLES DRAWN AEROBIC AND ANAEROBIC 10CC EACH   Final   Culture  Setup Time     Final   Value: 06/03/2013 05:11     Performed at [facility]   Culture     Final   Value: NO GROWTH 5 DAYS     Performed at [facility]   Report Status 06/09/2013 FINAL   Final  CULTURE, BLOOD (ROUTINE X 2)     Status: None   Collection Time    06/02/13 11:14 PM      Result Value Ref Range Status   Specimen Description BLOOD RIGHT HAND  Final   Special Requests BOTTLES DRAWN AEROBIC ONLY 10CC   Final   Culture  Setup Time     Final   Value: 06/03/2013 05:11     Performed at [facility]   Culture     Final   Value: NO GROWTH 5 DAYS     Performed at [facility]   Report Status 06/09/2013 FINAL   Final  URINE CULTURE     Status: None   Collection Time    06/02/13 11:38 PM      Result Value Ref Range Status   Specimen Description URINE, RANDOM   Final   Special Requests Normal   Final   Culture  Setup Time     Final   Value: 06/03/2013 00:28     Performed at [facility] Count     Final   Value: NO GROWTH     Performed at [facility]   Culture     Final   Value: NO GROWTH     Performed at [facility]   Report Status 06/04/2013 FINAL   Final  GRAM STAIN     Status: None   Collection Time    06/07/13 10:50 PM      Result Value Ref Range Status   Specimen Description URINE, RANDOM   Final   Special Requests Normal   Final   Gram Stain     Final   Value: CYTOSPIN     WBC PRESENT,BOTH PMN AND MONONUCLEAR     YEAST     SQUAMOUS EPITHELIAL CELLS PRESENT   Report Status 06/08/2013 FINAL   Final    Coagulation Studies: No results found for this basename: LABPROT, INR,  in the last 72 hours  Imaging: Dg Chest 2 View  07/03/2013   CLINICAL DATA:  Traumatic injury and pain  EXAM: CHEST  2 VIEW  COMPARISON:  06/02/2013  FINDINGS: The heart size and mediastinal contours are within normal limits. Both lungs are clear. The visualized skeletal structures are unremarkable.  IMPRESSION: No active cardiopulmonary disease.   Electronically Signed   By: Alcide Clever M.D.   On: 07/03/2013 13:18   Ct Head Wo Contrast  07/03/2013   CLINICAL DATA:  Current  history of multiple sclerosis. Patient fell forward onto the floor, face 1st, sustaining a laceration to the lip. Brief loss of consciousness.  EXAM: CT HEAD WITHOUT CONTRAST  CT MAXILLOFACIAL WITHOUT CONTRAST  CT CERVICAL SPINE WITHOUT CONTRAST  TECHNIQUE: Multidetector CT imaging of the head, cervical spine, and maxillofacial structures were performed using the standard protocol without intravenous contrast. Multiplanar CT image reconstructions of the cervical spine and maxillofacial structures were also generated.  COMPARISON:  MRI brain 06/04/2013, 09/06/2012. CT head 06/03/2013, 05/28/2012, 03/15/2010.  FINDINGS: CT HEAD FINDINGS  Patient's head is turned to the right. Ventricular system normal in size and appearance for age. Severe changes of small vessel disease of the white matter consistent with given history of MS, unchanged. No mass lesion. No midline shift. No acute hemorrhage or hematoma. No extra-axial fluid collections. No evidence of acute infarction.  No skull fracture or other focal osseous abnormality involving the skull. Bilateral mastoid air cells and middle ear cavities well aerated. Mild bilateral carotid siphon atherosclerosis.  CT MAXILLOFACIAL FINDINGS  Soft tissue swelling/ecchymosis involving the lower left cheek, overlying the left maxilla. No fractures identified involving the facial bones. Temporomandibular joints intact. Periapical lucency involving the 3rd molar in the left  side of the mandible (tooth 16).  Bony nasal septum midline. Paranasal sinuses well aerated. Left concha bullosa.  Both orbits and both globes intact.  CT CERVICAL SPINE FINDINGS  Patient's head is turned to the right, accounting for the rotation at C1-C2. No fractures identified involving the cervical spine. Soft tissue window images demonstrate no frank disc protrusions. Sagittal reconstructed images demonstrate straightening of the usual lordosis with anatomic posterior alignment. Mild disc space narrowing at  C4-5 and C5-6. Borderline spinal stenosis at C4-5. Facet joints intact throughout without significant degenerative change. Coronal reformatted images demonstrate an intact craniocervical junction, intact C1-C2 articulation, intact dens, and intact lateral masses throughout. No significant bony foraminal stenoses.  Visualized lung apices clear. Thyroid gland enlarged with multiple nodules, the largest approximating 2.4 cm.  IMPRESSION: 1. No acute intracranial abnormality. 2. Severe changes of small vessel disease of the white matter consistent with the given diagnosis of MS, not significantly changed since 2012. 3. No facial bone fractures. 4. Periapical lucency involving the 3rd left mandibular molar (tooth 16), query loosening versus abscess. 5. No cervical spine fractures. 6. Mild degenerative disc disease at C4-5 and C5-6 with borderline spinal stenosis at C4-5. 7. Multinodular goiter. Dominant 2.4 cm thyroid nodule. Consider further evaluation with nonemergent thyroid ultrasound. If patient is clinically hyperthyroid, consider nuclear medicine thyroid uptake and scan.   Electronically Signed   By: Hulan Saas M.D.   On: 07/03/2013 13:32   Ct Cervical Spine Wo Contrast  07/03/2013   CLINICAL DATA:  Current history of multiple sclerosis. Patient fell forward onto the floor, face 1st, sustaining a laceration to the lip. Brief loss of consciousness.  EXAM: CT HEAD WITHOUT CONTRAST  CT MAXILLOFACIAL WITHOUT CONTRAST  CT CERVICAL SPINE WITHOUT CONTRAST  TECHNIQUE: Multidetector CT imaging of the head, cervical spine, and maxillofacial structures were performed using the standard protocol without intravenous contrast. Multiplanar CT image reconstructions of the cervical spine and maxillofacial structures were also generated.  COMPARISON:  MRI brain 06/04/2013, 09/06/2012. CT head 06/03/2013, 05/28/2012, 03/15/2010.  FINDINGS: CT HEAD FINDINGS  Patient's head is turned to the right. Ventricular system normal in  size and appearance for age. Severe changes of small vessel disease of the white matter consistent with given history of MS, unchanged. No mass lesion. No midline shift. No acute hemorrhage or hematoma. No extra-axial fluid collections. No evidence of acute infarction.  No skull fracture or other focal osseous abnormality involving the skull. Bilateral mastoid air cells and middle ear cavities well aerated. Mild bilateral carotid siphon atherosclerosis.  CT MAXILLOFACIAL FINDINGS  Soft tissue swelling/ecchymosis involving the lower left cheek, overlying the left maxilla. No fractures identified involving the facial bones. Temporomandibular joints intact. Periapical lucency involving the 3rd molar in the left side of the mandible (tooth 16).  Bony nasal septum midline. Paranasal sinuses well aerated. Left concha bullosa.  Both orbits and both globes intact.  CT CERVICAL SPINE FINDINGS  Patient's head is turned to the right, accounting for the rotation at C1-C2. No fractures identified involving the cervical spine. Soft tissue window images demonstrate no frank disc protrusions. Sagittal reconstructed images demonstrate straightening of the usual lordosis with anatomic posterior alignment. Mild disc space narrowing at C4-5 and C5-6. Borderline spinal stenosis at C4-5. Facet joints intact throughout without significant degenerative change. Coronal reformatted images demonstrate an intact craniocervical junction, intact C1-C2 articulation, intact dens, and intact lateral masses throughout. No significant bony foraminal stenoses.  Visualized lung apices clear. Thyroid gland enlarged with multiple nodules,  the largest approximating 2.4 cm.  IMPRESSION: 1. No acute intracranial abnormality. 2. Severe changes of small vessel disease of the white matter consistent with the given diagnosis of MS, not significantly changed since 2012. 3. No facial bone fractures. 4. Periapical lucency involving the 3rd left mandibular molar  (tooth 16), query loosening versus abscess. 5. No cervical spine fractures. 6. Mild degenerative disc disease at C4-5 and C5-6 with borderline spinal stenosis at C4-5. 7. Multinodular goiter. Dominant 2.4 cm thyroid nodule. Consider further evaluation with nonemergent thyroid ultrasound. If patient is clinically hyperthyroid, consider nuclear medicine thyroid uptake and scan.   Electronically Signed   By: Hulan Saas M.D.   On: 07/03/2013 13:32   Ct Maxillofacial Wo Cm  07/03/2013   CLINICAL DATA:  Current history of multiple sclerosis. Patient fell forward onto the floor, face 1st, sustaining a laceration to the lip. Brief loss of consciousness.  EXAM: CT HEAD WITHOUT CONTRAST  CT MAXILLOFACIAL WITHOUT CONTRAST  CT CERVICAL SPINE WITHOUT CONTRAST  TECHNIQUE: Multidetector CT imaging of the head, cervical spine, and maxillofacial structures were performed using the standard protocol without intravenous contrast. Multiplanar CT image reconstructions of the cervical spine and maxillofacial structures were also generated.  COMPARISON:  MRI brain 06/04/2013, 09/06/2012. CT head 06/03/2013, 05/28/2012, 03/15/2010.  FINDINGS: CT HEAD FINDINGS  Patient's head is turned to the right. Ventricular system normal in size and appearance for age. Severe changes of small vessel disease of the white matter consistent with given history of MS, unchanged. No mass lesion. No midline shift. No acute hemorrhage or hematoma. No extra-axial fluid collections. No evidence of acute infarction.  No skull fracture or other focal osseous abnormality involving the skull. Bilateral mastoid air cells and middle ear cavities well aerated. Mild bilateral carotid siphon atherosclerosis.  CT MAXILLOFACIAL FINDINGS  Soft tissue swelling/ecchymosis involving the lower left cheek, overlying the left maxilla. No fractures identified involving the facial bones. Temporomandibular joints intact. Periapical lucency involving the 3rd molar in the left  side of the mandible (tooth 16).  Bony nasal septum midline. Paranasal sinuses well aerated. Left concha bullosa.  Both orbits and both globes intact.  CT CERVICAL SPINE FINDINGS  Patient's head is turned to the right, accounting for the rotation at C1-C2. No fractures identified involving the cervical spine. Soft tissue window images demonstrate no frank disc protrusions. Sagittal reconstructed images demonstrate straightening of the usual lordosis with anatomic posterior alignment. Mild disc space narrowing at C4-5 and C5-6. Borderline spinal stenosis at C4-5. Facet joints intact throughout without significant degenerative change. Coronal reformatted images demonstrate an intact craniocervical junction, intact C1-C2 articulation, intact dens, and intact lateral masses throughout. No significant bony foraminal stenoses.  Visualized lung apices clear. Thyroid gland enlarged with multiple nodules, the largest approximating 2.4 cm.  IMPRESSION: 1. No acute intracranial abnormality. 2. Severe changes of small vessel disease of the white matter consistent with the given diagnosis of MS, not significantly changed since 2012. 3. No facial bone fractures. 4. Periapical lucency involving the 3rd left mandibular molar (tooth 16), query loosening versus abscess. 5. No cervical spine fractures. 6. Mild degenerative disc disease at C4-5 and C5-6 with borderline spinal stenosis at C4-5. 7. Multinodular goiter. Dominant 2.4 cm thyroid nodule. Consider further evaluation with nonemergent thyroid ultrasound. If patient is clinically hyperthyroid, consider nuclear medicine thyroid uptake and scan.   Electronically Signed   By: Hulan Saas M.D.   On: 07/03/2013 13:32    Assessment and plan per attending neurologist  Onalee Hua  Ula LingoSmith PA-C [facility] [phone]  07/03/2013, 2:27 PM   Assessment/Plan: 43 YO female with known MS presenting to hospital after multiple days of weakness, N/V and falls with possible  transient loss of consciousness. At this time cannot exclude possible MS exacerbation causing weakness and falls.   1) MRI brain w/wo contrast to assess for signs of active MS  2) EEG 3) Will continue to follow.   Ritta SlotMcNeill Criston Chancellor, MD [facility] [phone]  If 7pm- 7am, please page neurology on call as listed in AMION.

## 2013-07-03 NOTE — Procedures (Signed)
History:   Sedation:   Technique: This is a 17 channel routine scalp EEG performed at the bedside with bipolar and monopolar montages arranged in accordance to the international 10/20 system of electrode placement. One channel was dedicated to EKG recording.    Background: There eis mild genralized irregualr delta activity during the waking state. In addition, there is focal irregualr delta activity at F7, T7. There is a PDR of 9 Hz recorded.   Photic stimulation: Physiologic driving is not performed  EEG Abnormalities: 1) Focal left temporal delta activity(similar to previous EEG) 2) generalized irregular delta activity.   Clinical Interpretation: This EEG is consistent with a focal area of cerebral dysfunction in the left temporal region in the setting of a mild generalized non-specific cerebral dysfunction(encephalopathy). There was no seizure or seizure predisposition recorded on this study.   Ritta Slot, MD [facility] [phone]  If 7pm- 7am, please page neurology on call as listed in AMION.

## 2013-07-03 NOTE — ED Notes (Signed)
EEG will put in for PT to be taken directly to the floor. PT needs stroke swallow screen before PO K+

## 2013-07-03 NOTE — H&P (Signed)
[facility] History and Physical  [name] [name] WUJ:[id number] DOB: 06/17/1970 DOA: 07/03/2013  Referring physician:  PCP: Jeanann Lewandowsky, MD  Specialists:   Chief Complaint: fall  HPI: [name] [name] is a 43 y.o. female with PMH of HTN, DM, MS presented with frequent falls; Pt is not well ambulatory due to MS, but per her son she has been falling a lot, recently has R sided facial injury due to fall; Patient also reports having nausea, vomiting for 1 day,nmo diarrhea, no abd pain, no hematemesis;  -Pt denies SOS, no chest pain, no cough, no headaches; reports generalized weakness, fatigue;   Review of Systems: The patient denies anorexia, fever, weight loss,, vision loss, decreased hearing, hoarseness, chest pain, syncope, dyspnea on exertion, peripheral edema, balance deficits, hemoptysis, abdominal pain, melena, hematochezia, severe indigestion/heartburn, hematuria, incontinence, genital sores, muscle weakness, suspicious skin lesions, transient blindness, difficulty walking, depression, unusual weight change, abnormal bleeding, enlarged lymph nodes, angioedema, and breast masses.    Past Medical History  Diagnosis Date  . Multiple sclerosis   . HYPERTENSION 09/30/2006  . DIABETES MELLITUS, TYPE II 09/30/2006  . TIA (transient ischemic attack)   . TOBACCO USER 09/30/2006    Qualifier: Diagnosis of  By: Delrae Alfred MD, Lanora Manis    . Multiple sclerosis, relapsing-remitting 05/29/2012  . DYSLIPIDEMIA 09/30/2006    Qualifier: Diagnosis of  By: Delrae Alfred MD, Lanora Manis    . DIABETES MELLITUS, TYPE II 09/30/2006    Qualifier: Diagnosis of  By: Delrae Alfred MD, Lanora Manis    . BENIGN POSITIONAL VERTIGO 01/27/2010    Qualifier: Diagnosis of  By: Delrae Alfred MD, Lanora Manis    . Stroke    History reviewed. No pertinent past surgical history. Social History:  reports that she has quit smoking. She does not have any smokeless tobacco history on file. She reports that she does not drink alcohol or use  illicit drugs. Home;  where does patient live--home, ALF, SNF? and with whom if at home? No;  Can patient participate in ADLs?  No Known Allergies  Family History  Problem Relation Age of Onset  . Hypertension Mother   . Kidney failure Mother   . Hypertension Father   . Gout Father   . Hypertension Sister   . Diabetes Sister   . Hypertension Brother     (be sure to complete)  Prior to Admission medications   Medication Sig Start Date End Date Taking? Authorizing Provider  amLODipine (NORVASC) 10 MG tablet Take 1 tablet (10 mg total) by mouth daily. 05/25/13  Yes Jeanann Lewandowsky, MD  aspirin EC 81 MG tablet Take 81 mg by mouth daily.   Yes Historical Provider, MD  meclizine (ANTIVERT) 25 MG tablet Take 1 tablet (25 mg total) by mouth 3 (three) times daily as needed for dizziness or nausea. 09/19/12  Yes Dorothea Ogle, MD  metFORMIN (GLUCOPHAGE) 500 MG tablet Take 1 tablet (500 mg total) by mouth daily with breakfast. 05/25/13  Yes Jeanann Lewandowsky, MD  metoprolol tartrate (LOPRESSOR) 25 MG tablet Take 25 mg by mouth 2 (two) times daily.   Yes Historical Provider, MD  potassium chloride SA (K-DUR,KLOR-CON) 20 MEQ tablet Take 1 tablet (20 mEq total) by mouth daily. 04/30/13  Yes Olivia Mackie, MD   Physical Exam: Filed Vitals:   07/03/13 1415  BP: 152/86  Pulse: 107  Temp:   Resp: 22     General:  Alert, but confused  Eyes: EOM-i  ENT: L bupper lips, buccal edema laceration, non bleeding;  Neck: supple   Cardiovascular: s1,s2 rrr  Respiratory: CTA BL  Abdomen: soft, nt,nd   Skin: no rash   Musculoskeletal: no LE edema  Psychiatric: no hallucinations   Neurologic: mild L facial asymmetry due to edema/injury otherwise cn 2-12 intact,? L hemianopsia; motor 5/5 extremities     Labs on Admission:  Basic Metabolic Panel:  Recent Labs Lab 07/03/13 1155 07/03/13 1208  NA 146  --   K 2.9*  --   CL 104  --   CO2 24  --   GLUCOSE 129*  --   BUN 8  --   CREATININE  0.52  --   CALCIUM 10.0  --   MG  --  2.0   Liver Function Tests:  Recent Labs Lab 07/03/13 1155  AST 19  ALT 15  ALKPHOS 91  BILITOT 1.8*  PROT 7.9  ALBUMIN 4.5   No results found for this basename: LIPASE, AMYLASE,  in the last 168 hours No results found for this basename: AMMONIA,  in the last 168 hours CBC:  Recent Labs Lab 07/03/13 1155  WBC 7.9  NEUTROABS 5.8  HGB 14.6  HCT 42.1  MCV 92.9  PLT 281   Cardiac Enzymes:  Recent Labs Lab 07/03/13 1208  TROPONINI <0.30    BNP (last 3 results) No results found for this basename: PROBNP,  in the last 8760 hours CBG: No results found for this basename: GLUCAP,  in the last 168 hours  Radiological Exams on Admission: Dg Chest 2 View  07/03/2013   CLINICAL DATA:  Traumatic injury and pain  EXAM: CHEST  2 VIEW  COMPARISON:  06/02/2013  FINDINGS: The heart size and mediastinal contours are within normal limits. Both lungs are clear. The visualized skeletal structures are unremarkable.  IMPRESSION: No active cardiopulmonary disease.   Electronically Signed   By: Alcide Clever M.D.   On: 07/03/2013 13:18   Ct Head Wo Contrast  07/03/2013   CLINICAL DATA:  Current history of multiple sclerosis. Patient fell forward onto the floor, face 1st, sustaining a laceration to the lip. Brief loss of consciousness.  EXAM: CT HEAD WITHOUT CONTRAST  CT MAXILLOFACIAL WITHOUT CONTRAST  CT CERVICAL SPINE WITHOUT CONTRAST  TECHNIQUE: Multidetector CT imaging of the head, cervical spine, and maxillofacial structures were performed using the standard protocol without intravenous contrast. Multiplanar CT image reconstructions of the cervical spine and maxillofacial structures were also generated.  COMPARISON:  MRI brain 06/04/2013, 09/06/2012. CT head 06/03/2013, 05/28/2012, 03/15/2010.  FINDINGS: CT HEAD FINDINGS  Patient's head is turned to the right. Ventricular system normal in size and appearance for age. Severe changes of small vessel disease  of the white matter consistent with given history of MS, unchanged. No mass lesion. No midline shift. No acute hemorrhage or hematoma. No extra-axial fluid collections. No evidence of acute infarction.  No skull fracture or other focal osseous abnormality involving the skull. Bilateral mastoid air cells and middle ear cavities well aerated. Mild bilateral carotid siphon atherosclerosis.  CT MAXILLOFACIAL FINDINGS  Soft tissue swelling/ecchymosis involving the lower left cheek, overlying the left maxilla. No fractures identified involving the facial bones. Temporomandibular joints intact. Periapical lucency involving the 3rd molar in the left side of the mandible (tooth 16).  Bony nasal septum midline. Paranasal sinuses well aerated. Left concha bullosa.  Both orbits and both globes intact.  CT CERVICAL SPINE FINDINGS  Patient's head is turned to the right, accounting for the rotation at C1-C2. No fractures identified involving the  cervical spine. Soft tissue window images demonstrate no frank disc protrusions. Sagittal reconstructed images demonstrate straightening of the usual lordosis with anatomic posterior alignment. Mild disc space narrowing at C4-5 and C5-6. Borderline spinal stenosis at C4-5. Facet joints intact throughout without significant degenerative change. Coronal reformatted images demonstrate an intact craniocervical junction, intact C1-C2 articulation, intact dens, and intact lateral masses throughout. No significant bony foraminal stenoses.  Visualized lung apices clear. Thyroid gland enlarged with multiple nodules, the largest approximating 2.4 cm.  IMPRESSION: 1. No acute intracranial abnormality. 2. Severe changes of small vessel disease of the white matter consistent with the given diagnosis of MS, not significantly changed since 2012. 3. No facial bone fractures. 4. Periapical lucency involving the 3rd left mandibular molar (tooth 16), query loosening versus abscess. 5. No cervical spine  fractures. 6. Mild degenerative disc disease at C4-5 and C5-6 with borderline spinal stenosis at C4-5. 7. Multinodular goiter. Dominant 2.4 cm thyroid nodule. Consider further evaluation with nonemergent thyroid ultrasound. If patient is clinically hyperthyroid, consider nuclear medicine thyroid uptake and scan.   Electronically Signed   By: Hulan Saas M.D.   On: 07/03/2013 13:32   Ct Cervical Spine Wo Contrast  07/03/2013   CLINICAL DATA:  Current history of multiple sclerosis. Patient fell forward onto the floor, face 1st, sustaining a laceration to the lip. Brief loss of consciousness.  EXAM: CT HEAD WITHOUT CONTRAST  CT MAXILLOFACIAL WITHOUT CONTRAST  CT CERVICAL SPINE WITHOUT CONTRAST  TECHNIQUE: Multidetector CT imaging of the head, cervical spine, and maxillofacial structures were performed using the standard protocol without intravenous contrast. Multiplanar CT image reconstructions of the cervical spine and maxillofacial structures were also generated.  COMPARISON:  MRI brain 06/04/2013, 09/06/2012. CT head 06/03/2013, 05/28/2012, 03/15/2010.  FINDINGS: CT HEAD FINDINGS  Patient's head is turned to the right. Ventricular system normal in size and appearance for age. Severe changes of small vessel disease of the white matter consistent with given history of MS, unchanged. No mass lesion. No midline shift. No acute hemorrhage or hematoma. No extra-axial fluid collections. No evidence of acute infarction.  No skull fracture or other focal osseous abnormality involving the skull. Bilateral mastoid air cells and middle ear cavities well aerated. Mild bilateral carotid siphon atherosclerosis.  CT MAXILLOFACIAL FINDINGS  Soft tissue swelling/ecchymosis involving the lower left cheek, overlying the left maxilla. No fractures identified involving the facial bones. Temporomandibular joints intact. Periapical lucency involving the 3rd molar in the left side of the mandible (tooth 16).  Bony nasal septum  midline. Paranasal sinuses well aerated. Left concha bullosa.  Both orbits and both globes intact.  CT CERVICAL SPINE FINDINGS  Patient's head is turned to the right, accounting for the rotation at C1-C2. No fractures identified involving the cervical spine. Soft tissue window images demonstrate no frank disc protrusions. Sagittal reconstructed images demonstrate straightening of the usual lordosis with anatomic posterior alignment. Mild disc space narrowing at C4-5 and C5-6. Borderline spinal stenosis at C4-5. Facet joints intact throughout without significant degenerative change. Coronal reformatted images demonstrate an intact craniocervical junction, intact C1-C2 articulation, intact dens, and intact lateral masses throughout. No significant bony foraminal stenoses.  Visualized lung apices clear. Thyroid gland enlarged with multiple nodules, the largest approximating 2.4 cm.  IMPRESSION: 1. No acute intracranial abnormality. 2. Severe changes of small vessel disease of the white matter consistent with the given diagnosis of MS, not significantly changed since 2012. 3. No facial bone fractures. 4. Periapical lucency involving the 3rd left mandibular molar (  tooth 16), query loosening versus abscess. 5. No cervical spine fractures. 6. Mild degenerative disc disease at C4-5 and C5-6 with borderline spinal stenosis at C4-5. 7. Multinodular goiter. Dominant 2.4 cm thyroid nodule. Consider further evaluation with nonemergent thyroid ultrasound. If patient is clinically hyperthyroid, consider nuclear medicine thyroid uptake and scan.   Electronically Signed   By: Hulan Saashomas  Lawrence M.D.   On: 07/03/2013 13:32   Ct Maxillofacial Wo Cm  07/03/2013   CLINICAL DATA:  Current history of multiple sclerosis. Patient fell forward onto the floor, face 1st, sustaining a laceration to the lip. Brief loss of consciousness.  EXAM: CT HEAD WITHOUT CONTRAST  CT MAXILLOFACIAL WITHOUT CONTRAST  CT CERVICAL SPINE WITHOUT CONTRAST   TECHNIQUE: Multidetector CT imaging of the head, cervical spine, and maxillofacial structures were performed using the standard protocol without intravenous contrast. Multiplanar CT image reconstructions of the cervical spine and maxillofacial structures were also generated.  COMPARISON:  MRI brain 06/04/2013, 09/06/2012. CT head 06/03/2013, 05/28/2012, 03/15/2010.  FINDINGS: CT HEAD FINDINGS  Patient's head is turned to the right. Ventricular system normal in size and appearance for age. Severe changes of small vessel disease of the white matter consistent with given history of MS, unchanged. No mass lesion. No midline shift. No acute hemorrhage or hematoma. No extra-axial fluid collections. No evidence of acute infarction.  No skull fracture or other focal osseous abnormality involving the skull. Bilateral mastoid air cells and middle ear cavities well aerated. Mild bilateral carotid siphon atherosclerosis.  CT MAXILLOFACIAL FINDINGS  Soft tissue swelling/ecchymosis involving the lower left cheek, overlying the left maxilla. No fractures identified involving the facial bones. Temporomandibular joints intact. Periapical lucency involving the 3rd molar in the left side of the mandible (tooth 16).  Bony nasal septum midline. Paranasal sinuses well aerated. Left concha bullosa.  Both orbits and both globes intact.  CT CERVICAL SPINE FINDINGS  Patient's head is turned to the right, accounting for the rotation at C1-C2. No fractures identified involving the cervical spine. Soft tissue window images demonstrate no frank disc protrusions. Sagittal reconstructed images demonstrate straightening of the usual lordosis with anatomic posterior alignment. Mild disc space narrowing at C4-5 and C5-6. Borderline spinal stenosis at C4-5. Facet joints intact throughout without significant degenerative change. Coronal reformatted images demonstrate an intact craniocervical junction, intact C1-C2 articulation, intact dens, and intact  lateral masses throughout. No significant bony foraminal stenoses.  Visualized lung apices clear. Thyroid gland enlarged with multiple nodules, the largest approximating 2.4 cm.  IMPRESSION: 1. No acute intracranial abnormality. 2. Severe changes of small vessel disease of the white matter consistent with the given diagnosis of MS, not significantly changed since 2012. 3. No facial bone fractures. 4. Periapical lucency involving the 3rd left mandibular molar (tooth 16), query loosening versus abscess. 5. No cervical spine fractures. 6. Mild degenerative disc disease at C4-5 and C5-6 with borderline spinal stenosis at C4-5. 7. Multinodular goiter. Dominant 2.4 cm thyroid nodule. Consider further evaluation with nonemergent thyroid ultrasound. If patient is clinically hyperthyroid, consider nuclear medicine thyroid uptake and scan.   Electronically Signed   By: Hulan Saashomas  Lawrence M.D.   On: 07/03/2013 13:32    EKG: Independently reviewed.   Assessment/Plan Principal Problem:   Multiple sclerosis exacerbation Active Problems:   DIABETES MELLITUS, TYPE II   HYPERTENSION   Multiple sclerosis  43 y.o. female with PMH of HTN, DM, MS presented with frequent falls, nausea, vomiting   1. Freqent fall likely due to MS; r/o seizure  -CT  head: chronic changes; no s/s of infection; afebrile; no leukocytosis;  -per neurology plan MRI head; ? Steroids; EEG; PT/OT  2. Nausea, vomiting ? Gastroenteritis; vs metformin related;  -abd exam benign; no s/s of acute abdomen -elevated bili ? Etiology; obtain liver US; check lipase, antiemetics, IVF;  3. Hypo K likely due to GI loss; IV replace; recheck AM 4. DM last HA1-5.2; ? Needs metformin; hold metformin with nausea, vomiting  -cotn diet control; ISS 5. Stable; cont amlodipine; monitor  6. L facial trauma; CT: Periapical lucency involving the 3rd left mandibular molar (tooth  16), query loosening versus abscess -no obvious drainable abscess on exam; monitor   7. Multinodular goiter. Dominant 2.4 cm thyroid nodule -obtain TSH, free t4; thyroid US  Neurology;  if consultant consulted, please document name and whether formally or informally consulted  Code Status: full (must indicate code status--if unknown or must be presumed, indicate so) Family Communication:  D/w patient, her son (indicate person spoken with, if applicable, with phone number if by telephone) Disposition Plan: pend PT (indicate anticipated LOS)  Time spent: >35 minutes   Esperanza Sheets [facility] Pager [phone]  If 7PM-7AM, please contact night-coverage [email or web address] Password [facility] 07/03/2013, 2:41 PM

## 2013-07-03 NOTE — ED Notes (Signed)
MD at bedside. neurology 

## 2013-07-03 NOTE — ED Notes (Signed)
MD at bedside.[place] 

## 2013-07-04 DIAGNOSIS — E119 Type 2 diabetes mellitus without complications: Secondary | ICD-10-CM

## 2013-07-04 DIAGNOSIS — F17200 Nicotine dependence, unspecified, uncomplicated: Secondary | ICD-10-CM

## 2013-07-04 DIAGNOSIS — E876 Hypokalemia: Secondary | ICD-10-CM

## 2013-07-04 LAB — GLUCOSE, CAPILLARY
GLUCOSE-CAPILLARY: 208 mg/dL — AB (ref 70–99)
Glucose-Capillary: 126 mg/dL — ABNORMAL HIGH (ref 70–99)
Glucose-Capillary: 220 mg/dL — ABNORMAL HIGH (ref 70–99)

## 2013-07-04 LAB — COMPREHENSIVE METABOLIC PANEL WITH GFR
ALT: 12 U/L (ref 0–35)
AST: 24 U/L (ref 0–37)
Albumin: 3.9 g/dL (ref 3.5–5.2)
Alkaline Phosphatase: 79 U/L (ref 39–117)
BUN: 6 mg/dL (ref 6–23)
CO2: 24 meq/L (ref 19–32)
Calcium: 9.4 mg/dL (ref 8.4–10.5)
Chloride: 103 meq/L (ref 96–112)
Creatinine, Ser: 0.58 mg/dL (ref 0.50–1.10)
GFR calc Af Amer: >90 mL/min
GFR calc non Af Amer: >90 mL/min
Glucose, Bld: 103 mg/dL — ABNORMAL HIGH (ref 70–99)
Potassium: 4 meq/L (ref 3.7–5.3)
Sodium: 142 meq/L (ref 137–147)
Total Bilirubin: 2.2 mg/dL — ABNORMAL HIGH (ref 0.3–1.2)
Total Protein: 7.2 g/dL (ref 6.0–8.3)

## 2013-07-04 LAB — T4, FREE: Free T4: 1.65 ng/dL (ref 0.80–1.80)

## 2013-07-04 MED ORDER — INSULIN ASPART 100 UNIT/ML ~~LOC~~ SOLN
0.0000 [IU] | Freq: Three times a day (TID) | SUBCUTANEOUS | Status: DC
  Administered 2013-07-04: 3 [IU] via SUBCUTANEOUS
  Administered 2013-07-05: 1 [IU] via SUBCUTANEOUS
  Administered 2013-07-05: 3 [IU] via SUBCUTANEOUS
  Administered 2013-07-05: 1 [IU] via SUBCUTANEOUS
  Administered 2013-07-06: 2 [IU] via SUBCUTANEOUS
  Administered 2013-07-06: 3 [IU] via SUBCUTANEOUS
  Administered 2013-07-06: 1 [IU] via SUBCUTANEOUS
  Administered 2013-07-07: 2 [IU] via SUBCUTANEOUS
  Administered 2013-07-07: 3 [IU] via SUBCUTANEOUS
  Administered 2013-07-07: 1 [IU] via SUBCUTANEOUS
  Administered 2013-07-08 (×2): 2 [IU] via SUBCUTANEOUS
  Administered 2013-07-08 – 2013-07-09 (×3): 1 [IU] via SUBCUTANEOUS

## 2013-07-04 MED ORDER — AMLODIPINE BESYLATE 5 MG PO TABS
5.0000 mg | ORAL_TABLET | Freq: Every day | ORAL | Status: DC
  Administered 2013-07-04 – 2013-07-09 (×6): 5 mg via ORAL
  Filled 2013-07-04 (×5): qty 1

## 2013-07-04 MED ORDER — SODIUM CHLORIDE 0.9 % IV SOLN
1000.0000 mg | Freq: Every day | INTRAVENOUS | Status: AC
  Administered 2013-07-04 – 2013-07-08 (×5): 1000 mg via INTRAVENOUS
  Filled 2013-07-04 (×5): qty 8

## 2013-07-04 NOTE — Evaluation (Signed)
Physical Therapy Evaluation Patient Details Name: Georgeanna LeaDeirdre K Hamric MRN: [id number] DOB: 11-03-1970 Today's Date: 07/04/2013   History of Present Illness  Admitted with MS exacerbation and accompanying fall with L facial trauma and sore L arm.  Clinical Impression  Pt admitted with/for MS exacerbation and fall.  Pt currently limited functionally due to the problems listed below.  (see problems list.)  Pt will benefit from PT to maximize function and safety to be able to get home safely with available assist of family.     Follow Up Recommendations Home health PT;Supervision/Assistance - 24 hour    Equipment Recommendations  None recommended by PT (TBA further)    Recommendations for Other Services       Precautions / Restrictions Precautions Precautions: Fall      Mobility  Bed Mobility Overal bed mobility: Needs Assistance Bed Mobility: Rolling;Sidelying to Sit;Sit to Supine Rolling: Min assist Sidelying to sit: Min assist   Sit to supine: Min assist   General bed mobility comments: cues for sequencing and best technique; truncal assist  Transfers Overall transfer level: Needs assistance Equipment used: Rolling walker (2 wheeled) Transfers: Sit to/from [employer] CorporationStand;Stand Pivot Transfers Sit to Stand: Min assist         General transfer comment: cues for hand placement; lift and forward assist  Ambulation/Gait Ambulation/Gait assistance: Mod assist Ambulation Distance (Feet): 2 Feet (away from bed, turn and side step back to bed 2-3 feet) Assistive device: Rolling walker (2 wheeled) Gait Pattern/deviations: Step-to pattern;Decreased step length - right;Decreased step length - left;Wide base of support     General Gait Details: short mildly ataxic and weak steps.  Shorter more difficult steps on right ? due to weak L Le  Stairs            Wheelchair Mobility    Modified Rankin (Stroke Patients Only)       Balance Overall balance assessment: Needs  assistance Sitting-balance support: No upper extremity supported Sitting balance-Leahy Scale: Fair     Standing balance support: Bilateral upper extremity supported Standing balance-Leahy Scale: Poor                               Pertinent Vitals/Pain     Home Living Family/patient expects to be discharged to:: Private residence Living Arrangements: Children (lives with adult son) Available Help at Discharge: Family;Available 24 hours/day Type of Home: Apartment Home Access: Level entry     Home Layout: One level Home Equipment: Walker - 2 wheels;Cane - single point Additional Comments: pt poor historian and no family present; PLOF and social history from OT note     Prior Function Level of Independence: Needs assistance   Gait / Transfers Assistance Needed: short distance amb in house with RW.  ADL's / Homemaking Assistance Needed: Assist with bathing (per pt report ?2x/week), meals and housekeeping.  Comments: "Yeah, I've fallen and Fritzi MandesQuentin helped me up and got me situated" Unable to confirm secondary to no family present     Hand Dominance        Extremity/Trunk Assessment   Upper Extremity Assessment: Defer to OT evaluation (L worse than R)           Lower Extremity Assessment: Generalized weakness;RLE deficits/detail;LLE deficits/detail (significant weakness and ataxia bilaterally) RLE Deficits / Details: gross extension 3+ at best LLE Deficits / Details: L appears weaker than R by MMT and function  Very weak pf/dr     Communication  Communication: Other (comment) (slow somewhat slurred speech)  Cognition Arousal/Alertness: Awake/alert Behavior During Therapy: WFL for tasks assessed/performed Overall Cognitive Status: No family/caregiver present to determine baseline cognitive functioning                      General Comments      Exercises        Assessment/Plan    PT Assessment Patient needs continued PT services  PT  Diagnosis Difficulty walking;Generalized weakness   PT Problem List Decreased strength;Decreased activity tolerance;Decreased balance;Decreased mobility;Decreased knowledge of use of DME;Decreased coordination  PT Treatment Interventions DME instruction;Gait training;Functional mobility training;Therapeutic activities;Therapeutic exercise;Patient/family education   PT Goals (Current goals can be found in the Care Plan section) Acute Rehab PT Goals Patient Stated Goal: Back home.  Walk better PT Goal Formulation: With patient Time For Goal Achievement: 07/18/13 Potential to Achieve Goals: Good    Frequency Min 3X/week   Barriers to discharge        Co-evaluation               End of Session   Activity Tolerance: Patient tolerated treatment well (pt fearful of her tremulousness) Patient left: in bed;with call bell/phone within reach;with nursing/sitter in room Nurse Communication: Mobility status         Time: 1700-1725 PT Time Calculation (min): 25 min   Charges:   PT Evaluation $Initial PT Evaluation Tier I: 1 Procedure PT Treatments $Therapeutic Activity: 8-22 mins   PT G Codes:          Ardythe Klute, Eliseo Gum 07/04/2013, 5:38 PM 07/04/2013  [place], PT [phone]  (pager)

## 2013-07-04 NOTE — Progress Notes (Addendum)
PROGRESS NOTE  [name] [name] WGN:[id number] DOB: [birth year] DOA: 07/03/2013 PCP: Jeanann LewandowskyJEGEDE, [name], MD  Brief History 43 y.o. female with PMH of HTN, DM, MS presented with frequent falls; Pt is not well ambulatory due to MS, but per her son she has been falling a lot, recently has R sided facial injury due to fall; Patient also reports having nausea, vomiting and diarrhea x 1 day, no abd pain, no hematemesis; since admission, the patient's vomiting and diarrhea have resolved. In fact her diet has been advanced which she has tolerated. Neurology was consulted to see the patient. MRI of the brain was consistent with acute demyelination concerning for multiple sclerosis exacerbation. The patient was started on Solu-Medrol IV.   Assessment/Plan: MS Exacerbation -accounting for her frequent falls and weakness  -CT head: chronic changes; no s/s of infection; afebrile; no leukocytosis;  -MRI brain shows enhancing white matter lesions c/w active demyelination -IV solumedrol per neurology -appreciate neuro f/u -EEG neg for seizure predisposition Nausea, vomiting  -? 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  -Dominant 2.4 cm thyroid nodule  -obtain TSH--0.083, free t4--1.65  -thyroid US--confirms multinodular goiter -may have a degree of  subclinical hyperthyroidsm, but recent steroids may have influenced results -outpt followup   Family Communication:   Pt at beside Disposition Plan:   Home when medically stable       Procedures/Studies: Dg Chest 2 View  07/03/2013   CLINICAL DATA:  Traumatic injury and pain  EXAM: CHEST  2 VIEW  COMPARISON:  06/02/2013  FINDINGS: The heart size and mediastinal contours are within normal limits. Both lungs are clear. The visualized skeletal structures are unremarkable.  IMPRESSION: No active cardiopulmonary disease.   Electronically Signed   By: Alcide CleverMark  Lukens M.D.   On: 07/03/2013 13:18   Ct Head Wo Contrast  07/03/2013   CLINICAL DATA:  Current history of multiple sclerosis. Patient fell forward onto the floor, face 1st, sustaining a laceration to the lip. Brief loss of consciousness.  EXAM: CT HEAD WITHOUT CONTRAST  CT MAXILLOFACIAL WITHOUT CONTRAST  CT CERVICAL SPINE WITHOUT CONTRAST  TECHNIQUE: Multidetector CT imaging of the head, cervical spine, and maxillofacial structures were performed using the standard protocol without intravenous contrast. Multiplanar CT image reconstructions of the cervical spine and maxillofacial structures were also generated.  COMPARISON:  MRI brain 06/04/2013, 09/06/2012. CT head 06/03/2013, 05/28/2012, 03/15/2010.  FINDINGS: CT HEAD FINDINGS  Patient's head is turned to the right. Ventricular system normal in size and appearance for age. Severe changes of small vessel disease of the white matter consistent with given history of MS, unchanged. No mass lesion. No midline shift. No acute hemorrhage or hematoma. No extra-axial fluid collections. No evidence of acute infarction.  No skull fracture or other focal osseous abnormality involving the skull. Bilateral mastoid air cells and middle ear cavities well aerated. Mild bilateral carotid siphon atherosclerosis.  CT MAXILLOFACIAL FINDINGS  Soft tissue swelling/ecchymosis  involving the lower left cheek, overlying the  left maxilla. No fractures identified involving the facial bones. Temporomandibular joints intact. Periapical lucency involving the 3rd molar in the left side of the mandible (tooth 16).  Bony nasal septum midline. Paranasal sinuses well aerated. Left concha bullosa.  Both orbits and both globes intact.  CT CERVICAL SPINE FINDINGS  Patient's head is turned to the right, accounting for the rotation at C1-C2. No fractures identified involving the cervical spine. Soft tissue window images demonstrate no frank disc protrusions. Sagittal reconstructed images demonstrate straightening of the usual lordosis with anatomic posterior alignment. Mild disc space narrowing at C4-5 and C5-6. Borderline spinal stenosis at C4-5. Facet joints intact throughout without significant degenerative change. Coronal reformatted images demonstrate an intact craniocervical junction, intact C1-C2 articulation, intact dens, and intact lateral masses throughout. No significant bony foraminal stenoses.  Visualized lung apices clear. Thyroid gland enlarged with multiple nodules, the largest approximating 2.4 cm.  IMPRESSION: 1. No acute intracranial abnormality. 2. Severe changes of small vessel disease of the white matter consistent with the given diagnosis of MS, not significantly changed since 2012. 3. No facial bone fractures. 4. Periapical lucency involving the 3rd left mandibular molar (tooth 16), query loosening versus abscess. 5. No cervical spine fractures. 6. Mild degenerative disc disease at C4-5 and C5-6 with borderline spinal stenosis at C4-5. 7. Multinodular goiter. Dominant 2.4 cm thyroid nodule. Consider further evaluation with nonemergent thyroid ultrasound. If patient is clinically hyperthyroid, consider nuclear medicine thyroid uptake and scan.   Electronically Signed   By: Hulan Saas M.D.   On: 07/03/2013 13:32   Ct Cervical Spine Wo Contrast  07/03/2013   CLINICAL DATA:  Current history of multiple sclerosis. Patient  fell forward onto the floor, face 1st, sustaining a laceration to the lip. Brief loss of consciousness.  EXAM: CT HEAD WITHOUT CONTRAST  CT MAXILLOFACIAL WITHOUT CONTRAST  CT CERVICAL SPINE WITHOUT CONTRAST  TECHNIQUE: Multidetector CT imaging of the head, cervical spine, and maxillofacial structures were performed using the standard protocol without intravenous contrast. Multiplanar CT image reconstructions of the cervical spine and maxillofacial structures were also generated.  COMPARISON:  MRI brain 06/04/2013, 09/06/2012. CT head 06/03/2013, 05/28/2012, 03/15/2010.  FINDINGS: CT HEAD FINDINGS  Patient's head is turned to the right. Ventricular system normal in size and appearance for age. Severe changes of small vessel disease of the white matter consistent with given history of MS, unchanged. No mass lesion. No midline shift. No acute hemorrhage or hematoma. No extra-axial fluid collections. No evidence of acute infarction.  No skull fracture or other focal osseous abnormality involving the skull. Bilateral mastoid air cells and middle ear cavities well aerated. Mild bilateral carotid siphon atherosclerosis.  CT MAXILLOFACIAL FINDINGS  Soft tissue swelling/ecchymosis involving the lower left cheek, overlying the left maxilla. No fractures identified involving the facial bones. Temporomandibular joints intact. Periapical lucency involving the 3rd molar in the left side of the mandible (tooth 16).  Bony nasal septum midline. Paranasal sinuses well aerated. Left concha bullosa.  Both orbits and both globes intact.  CT CERVICAL SPINE FINDINGS  Patient's head is turned to the right, accounting for the rotation at C1-C2. No fractures identified involving the cervical spine. Soft tissue window images demonstrate no frank disc protrusions. Sagittal reconstructed images demonstrate straightening of the usual lordosis with anatomic posterior alignment. Mild disc space narrowing at C4-5 and C5-6. Borderline spinal  stenosis at C4-5. Facet joints intact throughout without significant degenerative change. Coronal reformatted images demonstrate an  intact craniocervical junction, intact C1-C2 articulation, intact dens, and intact lateral masses throughout. No significant bony foraminal stenoses.  Visualized lung apices clear. Thyroid gland enlarged with multiple nodules, the largest approximating 2.4 cm.  IMPRESSION: 1. No acute intracranial abnormality. 2. Severe changes of small vessel disease of the white matter consistent with the given diagnosis of MS, not significantly changed since 2012. 3. No facial bone fractures. 4. Periapical lucency involving the 3rd left mandibular molar (tooth 16), query loosening versus abscess. 5. No cervical spine fractures. 6. Mild degenerative disc disease at C4-5 and C5-6 with borderline spinal stenosis at C4-5. 7. Multinodular goiter. Dominant 2.4 cm thyroid nodule. Consider further evaluation with nonemergent thyroid ultrasound. If patient is clinically hyperthyroid, consider nuclear medicine thyroid uptake and scan.   Electronically Signed   By: Hulan Saas M.D.   On: 07/03/2013 13:32   Mr Brain Wo Contrast  06/04/2013   CLINICAL DATA:  Multiple sclerosis with relapse.  EXAM: MRI HEAD WITHOUT CONTRAST  TECHNIQUE: Multiplanar, multiecho pulse sequences of the brain and surrounding structures were obtained without intravenous contrast.  COMPARISON:  MRI with contrast 09/06/2012  FINDINGS: Severe changes of multiple sclerosis. There is severe diffuse hyperintensity throughout the cerebral white matter bilaterally. There are numerous cystic areas of demyelinization in the white matter bilaterally which are slightly smaller compared with the prior study. Demyelinating disease is noted in the central pons and brachium pontis bilaterally right greater than left, similar to the prior study.  Diffusion-weighted imaging is negative for restricted diffusion. Intravenous contrast not  administered today. Multiple enhancing lesions were identified on the prior study.  There is atrophy. Ventricle size is normal and there is no shift of the midline structures. No acute infarct.  IMPRESSION: Severe changes of multiple sclerosis throughout the cerebral white matter and in the posterior fossa. Cystic demyelination cerebral white matter bilaterally shows mild interval improvement.  No new areas of demyelinization are present. No areas of restricted diffusion are present.   Electronically Signed   By: Marlan Palau M.D.   On: 06/04/2013 14:44   Mr Laqueta Jean JF Contrast  07/03/2013   CLINICAL DATA:  MS exacerbation versus CVA  EXAM: MRI HEAD WITHOUT AND WITH CONTRAST  TECHNIQUE: Multiplanar, multiecho pulse sequences of the brain and surrounding structures were obtained without and with intravenous contrast.  CONTRAST:  1mL MULTIHANCE GADOBENATE DIMEGLUMINE 529 MG/ML IV SOLN  COMPARISON:  Prior MRI from 06/04/2013.  FINDINGS: Diffuse cerebral atrophy is unchanged. No mass lesion, midline shift, or extra-axial fluid collection. Ventricles are normal in size without evidence of hydrocephalus.  Multiple T2/FLAIR hyperdensities again seen throughout the cerebral white matter bilaterally, compatible with known history of multiple sclerosis. Multiple cystic areas of demyelinization again noted. Involvement of the central pons and brachium pontis bilaterally, right greater than left again noted as well. On post-contrast sequences, many newly active lesions demonstrate incomplete rings of peripheral enhancement, compatible with active demyelination. For reference purposes, the largest of these lesions within the left cerebral hemisphere is located within the deep/subcortical white matter of the left frontoparietal region and measures 2.0 x 0.9 cm (series 12, image 28). The largest lesion within the right cerebral hemisphere is seen within the right centrum semi ovale and measures approximately 6 mm (series 12,  image 35). Multiple additional punctate areas of enhancement seen within the deep white matter of the bilateral frontal lobes (series 12, image 29, 28, 27). There is a 5 mm enhancing lesion within the right corpus callosum (series  12, image 25). Large enhancing lesion within the right pons measures 1.0 x 1.4 cm (series 12, image 15). Additional enhancing lesion present within the right middle cerebellar peduncle.  Several punctate foci of enhancement seen within the partially visualized upper cervical spinal cord at the level of C2 and C3 (series 14, image 11).  No diffusion-weighted signal abnormality is identified to suggest acute intracranial infarct. Gray-white matter differentiation is maintained. Normal flow voids are seen within the intracranial vasculature. No intracranial hemorrhage identified.  The cervicomedullary junction is normal. Pituitary gland is within normal limits. Pituitary stalk is midline. The globes and optic nerves demonstrate a normal appearance with normal signal intensity.  The bone marrow signal intensity is normal. Calvarium is intact. Visualized upper cervical spine is within normal limits.  Scalp soft tissues are unremarkable.  Paranasal sinuses are clear.  No mastoid effusion.  IMPRESSION: 1. Findings consistent with acute multiple sclerosis exacerbation with multiple enhancing white matter lesions within the supratentorial and infratentorial brain as above, compatible with active demyelination. 2. Punctate foci of enhancement within the upper cervical spinal cord at the level of C2 and C3, also consistent with active demyelination. 3. No evidence of acute intracranial infarct.   Electronically Signed   By: Rise Mu M.D.   On: 07/03/2013 23:14   US Soft Tissue Head/neck  07/03/2013   CLINICAL DATA:  Evaluate thyroid nodules  EXAM: THYROID ULTRASOUND  TECHNIQUE: Ultrasound examination of the thyroid gland and adjacent soft tissues was performed.  COMPARISON:  None.   FINDINGS: Right thyroid lobe  Measurements: 5.2 x 2.2 by 2.2 cm. There are several solid nodules within the right lobe. The largest is in the upper pole measuring 2.4 x 1.5 x 2.4 cm. This is in the mid portion of the right lobe adjacent to the isthmus. There are no microcalcifications or hypervascularity associated with this well-circumscribed nodule.  Left thyroid lobe  Measurements: 3.6 x 1.6 x 1.1 cm. Multiple sub cm nodules are identified within the left lobe. The largest is in the inferior pole measuring 1 x 0.5 x 0.9 cm.  Isthmus  Thickness: The isthmus measures 0.8 cm.  Lymphadenopathy  None visualized.  IMPRESSION: 1. Multi nodular thyroid gland. The largest nodule is in the upper pole of the right lobe. Findings meet consensus criteria for biopsy. Ultrasound-guided fine needle aspiration should be considered, as per the consensus statement: Management of Thyroid Nodules Detected at [place]: Society of Radiologists in Ultrasound Consensus Conference Statement. Radiology 2005; [id number]. 2. No adenopathy identified.   Electronically Signed   By: Signa Kell M.D.   On: 07/03/2013 18:42   US Abdomen Complete  07/03/2013   CLINICAL DATA:  Elevated bilirubin  EXAM: ULTRASOUND ABDOMEN COMPLETE  COMPARISON:  Acute abdominal series 05/09/2013.  FINDINGS: Gallbladder:  Shadowing gallstones are present. No sonographic Eulah Pont sign is reported. Wall thickness is within normal limits at 2 mm.  Common bile duct:  Diameter: 3.0 mm, within normal limits.  Liver:  No focal lesion identified. Within normal limits in parenchymal echogenicity.  IVC:  No abnormality visualized.  Pancreas:  Visualized portion unremarkable.  Spleen:  Size and appearance within normal limits.  Right Kidney:  Length: 10.7 cm, within normal limits. Echogenicity within normal limits. No mass or hydronephrosis visualized.  Left Kidney:  Length: 9.6 cm, within normal limits. The left kidney is poorly visualized. No definite mass lesion or  hydronephrosis is evident.  Abdominal aorta:  2.1 cm, within normal limits.  Other findings:  1. Cholelithiasis  without evidence for cholecystitis. 2. No acute abnormality.   Electronically Signed   By: Gennette Pac M.D.   On: 07/03/2013 18:38   Ct Maxillofacial Wo Cm  07/03/2013   CLINICAL DATA:  Current history of multiple sclerosis. Patient fell forward onto the floor, face 1st, sustaining a laceration to the lip. Brief loss of consciousness.  EXAM: CT HEAD WITHOUT CONTRAST  CT MAXILLOFACIAL WITHOUT CONTRAST  CT CERVICAL SPINE WITHOUT CONTRAST  TECHNIQUE: Multidetector CT imaging of the head, cervical spine, and maxillofacial structures were performed using the standard protocol without intravenous contrast. Multiplanar CT image reconstructions of the cervical spine and maxillofacial structures were also generated.  COMPARISON:  MRI brain 06/04/2013, 09/06/2012. CT head 06/03/2013, 05/28/2012, 03/15/2010.  FINDINGS: CT HEAD FINDINGS  Patient's head is turned to the right. Ventricular system normal in size and appearance for age. Severe changes of small vessel disease of the white matter consistent with given history of MS, unchanged. No mass lesion. No midline shift. No acute hemorrhage or hematoma. No extra-axial fluid collections. No evidence of acute infarction.  No skull fracture or other focal osseous abnormality involving the skull. Bilateral mastoid air cells and middle ear cavities well aerated. Mild bilateral carotid siphon atherosclerosis.  CT MAXILLOFACIAL FINDINGS  Soft tissue swelling/ecchymosis involving the lower left cheek, overlying the left maxilla. No fractures identified involving the facial bones. Temporomandibular joints intact. Periapical lucency involving the 3rd molar in the left side of the mandible (tooth 16).  Bony nasal septum midline. Paranasal sinuses well aerated. Left concha bullosa.  Both orbits and both globes intact.  CT CERVICAL SPINE FINDINGS  Patient's head is turned to  the right, accounting for the rotation at C1-C2. No fractures identified involving the cervical spine. Soft tissue window images demonstrate no frank disc protrusions. Sagittal reconstructed images demonstrate straightening of the usual lordosis with anatomic posterior alignment. Mild disc space narrowing at C4-5 and C5-6. Borderline spinal stenosis at C4-5. Facet joints intact throughout without significant degenerative change. Coronal reformatted images demonstrate an intact craniocervical junction, intact C1-C2 articulation, intact dens, and intact lateral masses throughout. No significant bony foraminal stenoses.  Visualized lung apices clear. Thyroid gland enlarged with multiple nodules, the largest approximating 2.4 cm.  IMPRESSION: 1. No acute intracranial abnormality. 2. Severe changes of small vessel disease of the white matter consistent with the given diagnosis of MS, not significantly changed since 2012. 3. No facial bone fractures. 4. Periapical lucency involving the 3rd left mandibular molar (tooth 16), query loosening versus abscess. 5. No cervical spine fractures. 6. Mild degenerative disc disease at C4-5 and C5-6 with borderline spinal stenosis at C4-5. 7. Multinodular goiter. Dominant 2.4 cm thyroid nodule. Consider further evaluation with nonemergent thyroid ultrasound. If patient is clinically hyperthyroid, consider nuclear medicine thyroid uptake and scan.   Electronically Signed   By: Hulan Saas M.D.   On: 07/03/2013 13:32         Subjective: Patient is feeling better. She states that her nausea and vomiting have improved patient has not had any bowel movement since admission. She denies fevers, chills, chest pain, shortness of breath, vomiting, diarrhea, vomiting, dysuria, hematuria.  Objective: Filed Vitals:   07/03/13 1618 07/03/13 2259 07/04/13 0613 07/04/13 0945  BP: 149/95 149/90 117/77 128/83  Pulse: 105 90 85 95  Temp: 98.4 F (36.9 C) 97.8 F (36.6 C) 97.9 F  (36.6 C)   TempSrc: Oral Oral Oral   Resp: 18 18 18    SpO2: 100% 100% 95%  Intake/Output Summary (Last 24 hours) at 07/04/13 1148 Last data filed at 07/04/13 0930  Gross per 24 hour  Intake 971.67 ml  Output      0 ml  Net 971.67 ml   Weight change:  Exam:   General:  Pt is alert, follows commands appropriately, not in acute distress  HEENT: No icterus, No thrush,  [place]/AT  Cardiovascular: RRR, S1/S2, no rubs, no gallops  Respiratory: CTA bilaterally, no wheezing, no crackles, no rhonchi  Abdomen: Soft/+BS, non tender, non distended, no guarding  Extremities: No edema, No lymphangitis, No petechiae, No rashes, no synovitis  Data Reviewed: Basic Metabolic Panel:  Recent Labs Lab 07/03/13 1155 07/03/13 1208 07/04/13 0623  NA 146  --  142  K 2.9*  --  4.0  CL 104  --  103  CO2 24  --  24  GLUCOSE 129*  --  103*  BUN 8  --  6  CREATININE 0.52  --  0.58  CALCIUM 10.0  --  9.4  MG  --  2.0  --    Liver Function Tests:  Recent Labs Lab 07/03/13 1155 07/04/13 0623  AST 19 24  ALT 15 12  ALKPHOS 91 79  BILITOT 1.8* 2.2*  PROT 7.9 7.2  ALBUMIN 4.5 3.9    Recent Labs Lab 07/03/13 1919  LIPASE 46   No results found for this basename: AMMONIA,  in the last 168 hours CBC:  Recent Labs Lab 07/03/13 1155  WBC 7.9  NEUTROABS 5.8  HGB 14.6  HCT 42.1  MCV 92.9  PLT 281   Cardiac Enzymes:  Recent Labs Lab 07/03/13 1208  TROPONINI <0.30   BNP: No components found with this basename: POCBNP,  CBG:  Recent Labs Lab 07/03/13 1649 07/03/13 1921 07/03/13 2307 07/04/13 0750  GLUCAP 98 93 94 126*    No results found for this or any previous visit (from the past 240 hour(s)).   Scheduled Meds: . amLODipine  5 mg Oral Daily  . aspirin EC  81 mg Oral Daily  . enoxaparin (LOVENOX) injection  40 mg Subcutaneous Q24H  . methylPREDNISolone (SOLU-MEDROL) injection  1,000 mg Intravenous Daily  . metoprolol tartrate  25 mg Oral BID  . potassium  chloride SA  20 mEq Oral BID  . potassium chloride  40 mEq Oral Once   Continuous Infusions: . 0.9 % NaCl with KCl 20 mEq / L 100 mL/hr at 07/04/13 0943     Berry Godsey, DO  [facility] Pager [phone]  If 7PM-7AM, please contact night-coverage [email or web address] Password [email or web address] 07/04/2013, 11:48 AM   LOS: 1 day

## 2013-07-04 NOTE — Progress Notes (Signed)
Subjective: No significant changes overnight.   Exam: Filed Vitals:   07/04/13 0945  BP: 128/83  Pulse: 95  Temp:   Resp:    Gen: In bed, NAD MS: Awake, alert IP:JASNK pupil slightly larger than left,  Motor: unable to fully lift left arm from bed, but good distal strength, ataxia bilaterally.  Sensory:intact to LT  MRI with mulitple areas of demyelination  Impression: 43 yo F with MS flare.   Recommendations: 1) solumedrol 1gm daily for 5 days.  2) PT,OT  Ritta Slot, MD [facility] [phone]  If 7pm- 7am, please page neurology on call as listed in AMION.

## 2013-07-05 DIAGNOSIS — I10 Essential (primary) hypertension: Secondary | ICD-10-CM

## 2013-07-05 LAB — COMPREHENSIVE METABOLIC PANEL
ALBUMIN: 3.5 g/dL (ref 3.5–5.2)
ALT: 12 U/L (ref 0–35)
AST: 12 U/L (ref 0–37)
Alkaline Phosphatase: 73 U/L (ref 39–117)
BILIRUBIN TOTAL: 1.2 mg/dL (ref 0.3–1.2)
BUN: 5 mg/dL — ABNORMAL LOW (ref 6–23)
CHLORIDE: 109 meq/L (ref 96–112)
CO2: 22 mEq/L (ref 19–32)
Calcium: 9.5 mg/dL (ref 8.4–10.5)
Creatinine, Ser: 0.46 mg/dL — ABNORMAL LOW (ref 0.50–1.10)
GFR calc Af Amer: >90 mL/min (ref >90)
Glucose, Bld: 158 mg/dL — ABNORMAL HIGH (ref 70–99)
Potassium: 4.3 mEq/L (ref 3.7–5.3)
SODIUM: 145 meq/L (ref 137–147)
Total Protein: 6.8 g/dL (ref 6.0–8.3)

## 2013-07-05 LAB — GLUCOSE, CAPILLARY
GLUCOSE-CAPILLARY: 147 mg/dL — AB (ref 70–99)
GLUCOSE-CAPILLARY: 137 mg/dL — AB (ref 70–99)
GLUCOSE-CAPILLARY: 234 mg/dL — AB (ref 70–99)
Glucose-Capillary: 171 mg/dL — ABNORMAL HIGH (ref 70–99)

## 2013-07-05 MED ORDER — POTASSIUM CHLORIDE CRYS ER 20 MEQ PO TBCR
20.0000 meq | EXTENDED_RELEASE_TABLET | Freq: Every day | ORAL | Status: DC
  Administered 2013-07-06 – 2013-07-09 (×4): 20 meq via ORAL
  Filled 2013-07-05 (×4): qty 1

## 2013-07-05 MED ORDER — SODIUM CHLORIDE 0.45 % IV SOLN: INTRAVENOUS | Status: DC

## 2013-07-05 MED ORDER — SODIUM CHLORIDE 0.45 % IV SOLN
INTRAVENOUS | Status: DC
  Filled 2013-07-05 (×2): qty 1000

## 2013-07-05 NOTE — Progress Notes (Signed)
Subjective: No significant changes overnight.   Exam: Filed Vitals:   07/05/13 0557  BP: 131/91  Pulse: 75  Temp: 97.6 F (36.4 C)  Resp: 16   Gen: In bed, NAD MS: Awake, alert NW:GNFAOCN:right pupil slightly larger than left,  Motor: some improvement in left arm strength, now 4/5 proximally.  Cerebellar: ataxia still present bilaterally, but somewhat improved.  Sensory:intact to LT  Impression: 43 yo F with MS flare.   Recommendations: 1) solumedrol 1gm daily for 5 days. Day 2/5 2) PT,OT  Ritta SlotMcNeill Reshard Guillet, MD [facility] [phone]  If 7pm- 7am, please page neurology on call as listed in AMION.

## 2013-07-05 NOTE — Progress Notes (Signed)
PROGRESS NOTE  [name] [name] AVW:[id number] DOB: 10-30-1970 DOA: 07/03/2013 PCP: Jeanann Lewandowsky, MD  Brief History  43 y.o. female with PMH of HTN, DM, MS presented with frequent falls; Pt is not well ambulatory due to MS, but per her son she has been falling a lot, recently has R sided facial injury due to fall; Patient also reports having nausea, vomiting and diarrhea x 1 day, no abd pain, no hematemesis; since admission, the patient's vomiting and diarrhea have resolved. In fact her diet has been advanced which she has tolerated. Neurology was consulted to see the patient. MRI of the brain was consistent with acute demyelination concerning for multiple sclerosis exacerbation. The patient was started on Solu-Medrol IV.  Assessment/Plan:  MS Exacerbation  -accounting for her frequent falls and weakness  -CT head: chronic changes; no s/s of infection; afebrile; no leukocytosis;  -MRI brain shows enhancing white matter lesions c/w active demyelination  -IV solumedrol per neurology--Day #2 of 5 -appreciate neuro f/u  -EEG neg for seizure predisposition  Nausea, vomiting  -? 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  -Dominant 2.4 cm thyroid nodule  -obtain TSH--0.083,  free t4--1.65  -thyroid US--confirms multinodular goiter  -may have a degree of subclinical hyperthyroidsm, but recent steroids may have influenced results  -outpt followup  Family Communication: Pt at beside  Disposition Plan: Home when medically stable     Procedures/Studies: Dg Chest 2 View  07/03/2013   CLINICAL DATA:  Traumatic injury and pain  EXAM: CHEST  2 VIEW  COMPARISON:  06/02/2013  FINDINGS: The heart size and mediastinal contours are within normal limits. Both lungs are clear. The visualized skeletal structures are unremarkable.  IMPRESSION: No active cardiopulmonary disease.   Electronically Signed   By: Alcide Clever M.D.   On: 07/03/2013 13:18   Ct Head Wo Contrast  07/03/2013   CLINICAL DATA:  Current history of multiple sclerosis. Patient fell forward onto the floor, face 1st, sustaining a laceration to the lip. Brief loss of consciousness.  EXAM: CT HEAD WITHOUT CONTRAST  CT MAXILLOFACIAL WITHOUT CONTRAST  CT CERVICAL SPINE WITHOUT CONTRAST  TECHNIQUE: Multidetector CT imaging of the head, cervical spine, and maxillofacial structures were performed using the standard protocol without intravenous contrast. Multiplanar CT image reconstructions of the cervical spine and maxillofacial structures were also generated.  COMPARISON:  MRI brain 06/04/2013, 09/06/2012. CT head 06/03/2013, 05/28/2012, 03/15/2010.  FINDINGS: CT HEAD FINDINGS  Patient's head is turned to the right. Ventricular system normal in size and appearance for age. Severe changes of small vessel disease of the white matter consistent with given history of MS, unchanged. No mass lesion. No midline shift. No acute hemorrhage or hematoma. No extra-axial fluid collections. No evidence of acute infarction.  No skull fracture or other focal osseous abnormality involving the  skull. Bilateral mastoid air cells and middle ear cavities well aerated. Mild bilateral carotid siphon atherosclerosis.  CT MAXILLOFACIAL FINDINGS  Soft  tissue swelling/ecchymosis involving the lower left cheek, overlying the left maxilla. No fractures identified involving the facial bones. Temporomandibular joints intact. Periapical lucency involving the 3rd molar in the left side of the mandible (tooth 16).  Bony nasal septum midline. Paranasal sinuses well aerated. Left concha bullosa.  Both orbits and both globes intact.  CT CERVICAL SPINE FINDINGS  Patient's head is turned to the right, accounting for the rotation at C1-C2. No fractures identified involving the cervical spine. Soft tissue window images demonstrate no frank disc protrusions. Sagittal reconstructed images demonstrate straightening of the usual lordosis with anatomic posterior alignment. Mild disc space narrowing at C4-5 and C5-6. Borderline spinal stenosis at C4-5. Facet joints intact throughout without significant degenerative change. Coronal reformatted images demonstrate an intact craniocervical junction, intact C1-C2 articulation, intact dens, and intact lateral masses throughout. No significant bony foraminal stenoses.  Visualized lung apices clear. Thyroid gland enlarged with multiple nodules, the largest approximating 2.4 cm.  IMPRESSION: 1. No acute intracranial abnormality. 2. Severe changes of small vessel disease of the white matter consistent with the given diagnosis of MS, not significantly changed since 2012. 3. No facial bone fractures. 4. Periapical lucency involving the 3rd left mandibular molar (tooth 16), query loosening versus abscess. 5. No cervical spine fractures. 6. Mild degenerative disc disease at C4-5 and C5-6 with borderline spinal stenosis at C4-5. 7. Multinodular goiter. Dominant 2.4 cm thyroid nodule. Consider further evaluation with nonemergent thyroid ultrasound. If patient is clinically hyperthyroid, consider nuclear medicine thyroid uptake and scan.   Electronically Signed   By: Hulan Saas M.D.   On: 07/03/2013 13:32   Ct Cervical Spine Wo  Contrast  07/03/2013   CLINICAL DATA:  Current history of multiple sclerosis. Patient fell forward onto the floor, face 1st, sustaining a laceration to the lip. Brief loss of consciousness.  EXAM: CT HEAD WITHOUT CONTRAST  CT MAXILLOFACIAL WITHOUT CONTRAST  CT CERVICAL SPINE WITHOUT CONTRAST  TECHNIQUE: Multidetector CT imaging of the head, cervical spine, and maxillofacial structures were performed using the standard protocol without intravenous contrast. Multiplanar CT image reconstructions of the cervical spine and maxillofacial structures were also generated.  COMPARISON:  MRI brain 06/04/2013, 09/06/2012. CT head 06/03/2013, 05/28/2012, 03/15/2010.  FINDINGS: CT HEAD FINDINGS  Patient's head is turned to the right. Ventricular system normal in size and appearance for age. Severe changes of small vessel disease of the white matter consistent with given history of MS, unchanged. No mass lesion. No midline shift. No acute hemorrhage or hematoma. No extra-axial fluid collections. No evidence of acute infarction.  No skull fracture or other focal osseous abnormality involving the skull. Bilateral mastoid air cells and middle ear cavities well aerated. Mild bilateral carotid siphon atherosclerosis.  CT MAXILLOFACIAL FINDINGS  Soft tissue swelling/ecchymosis involving the lower left cheek, overlying the left maxilla. No fractures identified involving the facial bones. Temporomandibular joints intact. Periapical lucency involving the 3rd molar in the left side of the mandible (tooth 16).  Bony nasal septum midline. Paranasal sinuses well aerated. Left concha bullosa.  Both orbits and both globes intact.  CT CERVICAL SPINE FINDINGS  Patient's head is turned to the right, accounting for the rotation at C1-C2. No fractures identified involving the cervical spine. Soft tissue window images demonstrate no frank disc protrusions. Sagittal reconstructed images demonstrate straightening of the usual lordosis with anatomic  posterior alignment. Mild disc  space narrowing at C4-5 and C5-6. Borderline spinal stenosis at C4-5. Facet joints intact throughout without significant degenerative change. Coronal reformatted images demonstrate an intact craniocervical junction, intact C1-C2 articulation, intact dens, and intact lateral masses throughout. No significant bony foraminal stenoses.  Visualized lung apices clear. Thyroid gland enlarged with multiple nodules, the largest approximating 2.4 cm.  IMPRESSION: 1. No acute intracranial abnormality. 2. Severe changes of small vessel disease of the white matter consistent with the given diagnosis of MS, not significantly changed since 2012. 3. No facial bone fractures. 4. Periapical lucency involving the 3rd left mandibular molar (tooth 16), query loosening versus abscess. 5. No cervical spine fractures. 6. Mild degenerative disc disease at C4-5 and C5-6 with borderline spinal stenosis at C4-5. 7. Multinodular goiter. Dominant 2.4 cm thyroid nodule. Consider further evaluation with nonemergent thyroid ultrasound. If patient is clinically hyperthyroid, consider nuclear medicine thyroid uptake and scan.   Electronically Signed   By: Hulan Saas M.D.   On: 07/03/2013 13:32   Mr Laqueta Jean ZO Contrast  07/03/2013   CLINICAL DATA:  MS exacerbation versus CVA  EXAM: MRI HEAD WITHOUT AND WITH CONTRAST  TECHNIQUE: Multiplanar, multiecho pulse sequences of the brain and surrounding structures were obtained without and with intravenous contrast.  CONTRAST:  12mL MULTIHANCE GADOBENATE DIMEGLUMINE 529 MG/ML IV SOLN  COMPARISON:  Prior MRI from 06/04/2013.  FINDINGS: Diffuse cerebral atrophy is unchanged. No mass lesion, midline shift, or extra-axial fluid collection. Ventricles are normal in size without evidence of hydrocephalus.  Multiple T2/FLAIR hyperdensities again seen throughout the cerebral white matter bilaterally, compatible with known history of multiple sclerosis. Multiple cystic areas of  demyelinization again noted. Involvement of the central pons and brachium pontis bilaterally, right greater than left again noted as well. On post-contrast sequences, many newly active lesions demonstrate incomplete rings of peripheral enhancement, compatible with active demyelination. For reference purposes, the largest of these lesions within the left cerebral hemisphere is located within the deep/subcortical white matter of the left frontoparietal region and measures 2.0 x 0.9 cm (series 12, image 28). The largest lesion within the right cerebral hemisphere is seen within the right centrum semi ovale and measures approximately 6 mm (series 12, image 35). Multiple additional punctate areas of enhancement seen within the deep white matter of the bilateral frontal lobes (series 12, image 29, 28, 27). There is a 5 mm enhancing lesion within the right corpus callosum (series 12, image 25). Large enhancing lesion within the right pons measures 1.0 x 1.4 cm (series 12, image 15). Additional enhancing lesion present within the right middle cerebellar peduncle.  Several punctate foci of enhancement seen within the partially visualized upper cervical spinal cord at the level of C2 and C3 (series 14, image 11).  No diffusion-weighted signal abnormality is identified to suggest acute intracranial infarct. Gray-white matter differentiation is maintained. Normal flow voids are seen within the intracranial vasculature. No intracranial hemorrhage identified.  The cervicomedullary junction is normal. Pituitary gland is within normal limits. Pituitary stalk is midline. The globes and optic nerves demonstrate a normal appearance with normal signal intensity.  The bone marrow signal intensity is normal. Calvarium is intact. Visualized upper cervical spine is within normal limits.  Scalp soft tissues are unremarkable.  Paranasal sinuses are clear.  No mastoid effusion.  IMPRESSION: 1. Findings consistent with acute multiple sclerosis  exacerbation with multiple enhancing white matter lesions within the supratentorial and infratentorial brain as above, compatible with active demyelination. 2. Punctate foci of enhancement within the  upper cervical spinal cord at the level of C2 and C3, also consistent with active demyelination. 3. No evidence of acute intracranial infarct.   Electronically Signed   By: Rise MuBenjamin  McClintock M.D.   On: 07/03/2013 23:14   [place] Soft Tissue Head/neck  07/03/2013   CLINICAL DATA:  Evaluate thyroid nodules  EXAM: THYROID ULTRASOUND  TECHNIQUE: Ultrasound examination of the thyroid gland and adjacent soft tissues was performed.  COMPARISON:  None.  FINDINGS: Right thyroid lobe  Measurements: 5.2 x 2.2 by 2.2 cm. There are several solid nodules within the right lobe. The largest is in the upper pole measuring 2.4 x 1.5 x 2.4 cm. This is in the mid portion of the right lobe adjacent to the isthmus. There are no microcalcifications or hypervascularity associated with this well-circumscribed nodule.  Left thyroid lobe  Measurements: 3.6 x 1.6 x 1.1 cm. Multiple sub cm nodules are identified within the left lobe. The largest is in the inferior pole measuring 1 x 0.5 x 0.9 cm.  Isthmus  Thickness: The isthmus measures 0.8 cm.  Lymphadenopathy  None visualized.  IMPRESSION: 1. Multi nodular thyroid gland. The largest nodule is in the upper pole of the right lobe. Findings meet consensus criteria for biopsy. Ultrasound-guided fine needle aspiration should be considered, as per the consensus statement: Management of Thyroid Nodules Detected at US: Society of Radiologists in Ultrasound Consensus Conference Statement. Radiology 2005; [id number]. 2. No adenopathy identified.   Electronically Signed   By: Signa Kellaylor  Stroud M.D.   On: 07/03/2013 18:42   [place] Abdomen Complete  07/03/2013   CLINICAL DATA:  Elevated bilirubin  EXAM: ULTRASOUND ABDOMEN COMPLETE  COMPARISON:  Acute abdominal series 05/09/2013.  FINDINGS: Gallbladder:   Shadowing gallstones are present. No sonographic Eulah PontMurphy sign is reported. Wall thickness is within normal limits at 2 mm.  Common bile duct:  Diameter: 3.0 mm, within normal limits.  Liver:  No focal lesion identified. Within normal limits in parenchymal echogenicity.  IVC:  No abnormality visualized.  Pancreas:  Visualized portion unremarkable.  Spleen:  Size and appearance within normal limits.  Right Kidney:  Length: 10.7 cm, within normal limits. Echogenicity within normal limits. No mass or hydronephrosis visualized.  Left Kidney:  Length: 9.6 cm, within normal limits. The left kidney is poorly visualized. No definite mass lesion or hydronephrosis is evident.  Abdominal aorta:  2.1 cm, within normal limits.  Other findings:  1. Cholelithiasis without evidence for cholecystitis. 2. No acute abnormality.   Electronically Signed   By: Gennette Pachris  Mattern M.D.   On: 07/03/2013 18:38   Ct Maxillofacial Wo Cm  07/03/2013   CLINICAL DATA:  Current history of multiple sclerosis. Patient fell forward onto the floor, face 1st, sustaining a laceration to the lip. Brief loss of consciousness.  EXAM: CT HEAD WITHOUT CONTRAST  CT MAXILLOFACIAL WITHOUT CONTRAST  CT CERVICAL SPINE WITHOUT CONTRAST  TECHNIQUE: Multidetector CT imaging of the head, cervical spine, and maxillofacial structures were performed using the standard protocol without intravenous contrast. Multiplanar CT image reconstructions of the cervical spine and maxillofacial structures were also generated.  COMPARISON:  MRI brain 06/04/2013, 09/06/2012. CT head 06/03/2013, 05/28/2012, 03/15/2010.  FINDINGS: CT HEAD FINDINGS  Patient's head is turned to the right. Ventricular system normal in size and appearance for age. Severe changes of small vessel disease of the white matter consistent with given history of MS, unchanged. No mass lesion. No midline shift. No acute hemorrhage or hematoma. No extra-axial fluid collections. No evidence of acute infarction.  No skull  fracture or other focal osseous abnormality involving the skull. Bilateral mastoid air cells and middle ear cavities well aerated. Mild bilateral carotid siphon atherosclerosis.  CT MAXILLOFACIAL FINDINGS  Soft tissue swelling/ecchymosis involving the lower left cheek, overlying the left maxilla. No fractures identified involving the facial bones. Temporomandibular joints intact. Periapical lucency involving the 3rd molar in the left side of the mandible (tooth 16).  Bony nasal septum midline. Paranasal sinuses well aerated. Left concha bullosa.  Both orbits and both globes intact.  CT CERVICAL SPINE FINDINGS  Patient's head is turned to the right, accounting for the rotation at C1-C2. No fractures identified involving the cervical spine. Soft tissue window images demonstrate no frank disc protrusions. Sagittal reconstructed images demonstrate straightening of the usual lordosis with anatomic posterior alignment. Mild disc space narrowing at C4-5 and C5-6. Borderline spinal stenosis at C4-5. Facet joints intact throughout without significant degenerative change. Coronal reformatted images demonstrate an intact craniocervical junction, intact C1-C2 articulation, intact dens, and intact lateral masses throughout. No significant bony foraminal stenoses.  Visualized lung apices clear. Thyroid gland enlarged with multiple nodules, the largest approximating 2.4 cm.  IMPRESSION: 1. No acute intracranial abnormality. 2. Severe changes of small vessel disease of the white matter consistent with the given diagnosis of MS, not significantly changed since 2012. 3. No facial bone fractures. 4. Periapical lucency involving the 3rd left mandibular molar (tooth 16), query loosening versus abscess. 5. No cervical spine fractures. 6. Mild degenerative disc disease at C4-5 and C5-6 with borderline spinal stenosis at C4-5. 7. Multinodular goiter. Dominant 2.4 cm thyroid nodule. Consider further evaluation with nonemergent thyroid  ultrasound. If patient is clinically hyperthyroid, consider nuclear medicine thyroid uptake and scan.   Electronically Signed   By: Hulan Saas M.D.   On: 07/03/2013 13:32         Subjective: Patient denies fevers, chills, headache, chest pain, dyspnea, nausea, vomiting, diarrhea, abdominal pain, dysuria, hematuria   Objective: Filed Vitals:   07/04/13 1415 07/04/13 2147 07/05/13 0557 07/05/13 1320  BP: 109/74 127/93 131/91 129/89  Pulse: 84 86 75 81  Temp: 98.4 F (36.9 C) 97.7 F (36.5 C) 97.6 F (36.4 C) 98.1 F (36.7 C)  TempSrc: Oral Oral Oral Oral  Resp: 15 16 16 17   SpO2: 100% 100% 100% 98%    Intake/Output Summary (Last 24 hours) at 07/05/13 1813 Last data filed at 07/05/13 1637  Gross per 24 hour  Intake   4136 ml  Output   2200 ml  Net   1936 ml   Weight change:  Exam:   General:  Pt is alert, follows commands appropriately, not in acute distress  HEENT: No icterus, No thrush,[place]/AT  Cardiovascular: RRR, S1/S2, no rubs, no gallops  Respiratory: CTA bilaterally, no wheezing, no crackles, no rhonchi  Abdomen: Soft/+BS, non tender, non distended, no guarding  Extremities: No edema, No lymphangitis, No petechiae, No rashes, no synovitis  Data Reviewed: Basic Metabolic Panel:  Recent Labs Lab 07/03/13 1155 07/03/13 1208 07/04/13 0623 07/05/13 0431  NA 146  --  142 145  K 2.9*  --  4.0 4.3  CL 104  --  103 109  CO2 24  --  24 22  GLUCOSE 129*  --  103* 158*  BUN 8  --  6 5*  CREATININE 0.52  --  0.58 0.46*  CALCIUM 10.0  --  9.4 9.5  MG  --  2.0  --   --    Liver Function  Tests:  Recent Labs Lab 07/03/13 1155 07/04/13 0623 07/05/13 0431  AST 19 24 12   ALT 15 12 12   ALKPHOS 91 79 73  BILITOT 1.8* 2.2* 1.2  PROT 7.9 7.2 6.8  ALBUMIN 4.5 3.9 3.5    Recent Labs Lab 07/03/13 1919  LIPASE 46   No results found for this basename: AMMONIA,  in the last 168 hours CBC:  Recent Labs Lab 07/03/13 1155  WBC 7.9  NEUTROABS 5.8    HGB 14.6  HCT 42.1  MCV 92.9  PLT 281   Cardiac Enzymes:  Recent Labs Lab 07/03/13 1208  TROPONINI <0.30   BNP: No components found with this basename: POCBNP,  CBG:  Recent Labs Lab 07/04/13 1719 07/04/13 2146 07/05/13 0750 07/05/13 1214 07/05/13 1647  GLUCAP 220* 208* 147* 137* 234*    No results found for this or any previous visit (from the past 240 hour(s)).   Scheduled Meds: . amLODipine  5 mg Oral Daily  . aspirin EC  81 mg Oral Daily  . enoxaparin (LOVENOX) injection  40 mg Subcutaneous Q24H  . insulin aspart  0-9 Units Subcutaneous TID WC  . methylPREDNISolone (SOLU-MEDROL) injection  1,000 mg Intravenous Daily  . metoprolol tartrate  25 mg Oral BID  . potassium chloride SA  20 mEq Oral BID  . potassium chloride  40 mEq Oral Once   Continuous Infusions: . 0.9 % NaCl with KCl 20 mEq / L 100 mL/hr at 07/05/13 1731     Janace Decker, DO  [facility] Pager [phone]  If 7PM-7AM, please contact night-coverage [email or web address] Password [email or web address] 07/05/2013, 6:13 PM   LOS: 2 days

## 2013-07-05 NOTE — Progress Notes (Signed)
Occupational Therapy Evaluation Patient Details Name: [name] [name] MRN: [id number] DOB: [birth year] Today's Date: 07/05/2013    History of Present Illness Pt is [name] admitted 07/03/13 for MS exacerbation and accompanying fall with L facial trauma and sore L arm.    Clinical Impression   PTA pt lived at home with her son and his girlfriend and required assistance for bathing and dressing as well as IADLs. Pt reports that she ambulated short distances with use of RW. Attempted to contact son for PLOF, however unable to reach at this time. Pt is poor historian and appears confused. Pt reports that she has had multiple falls at home, but this is uncorroborated with family. Feel that pt would benefit from SNF prior to d/c home to increase strength and promote safety and independence with ADLs. Pt would continue to benefit from skilled OT.     Follow Up Recommendations  SNF;Supervision/Assistance - 24 hour    Equipment Recommendations  3 in 1 bedside comode       Precautions / Restrictions Precautions Precautions: Fall Restrictions Weight Bearing Restrictions: No      Mobility Bed Mobility Overal bed mobility: Needs Assistance Bed Mobility: Supine to Sit;Sit to Supine     Supine to sit: Min assist Sit to supine: Min guard   General bed mobility comments: VC's for sequencing, truncal assist for supine>sit  Transfers Overall transfer level: Needs assistance Equipment used: Rolling walker (2 wheeled) Transfers: Sit to/from [employer] Sit to Stand: Min assist Stand pivot transfers: Min assist;Mod assist       General transfer comment: VC's for hand placement. Physical assist during stand-pivot to help shift weight and take steps toward bed.     Balance Overall balance assessment: Needs assistance Sitting-balance support: No upper extremity supported;Feet supported Sitting balance-Leahy Scale: Fair     Standing balance support: Bilateral upper  extremity supported;During functional activity Standing balance-Leahy Scale: Poor                              ADL Overall ADL's : Needs assistance/impaired Eating/Feeding: Set up;Sitting Eating/Feeding Details (indicate cue type and reason): pt reports some difficulty with self-feeding due to ataxic movements.  Grooming: Set up;Sitting   Upper Body Bathing: Sitting;Moderate assistance;Cueing for sequencing   Lower Body Bathing: Moderate assistance;Sit to/from stand;Cueing for sequencing   Upper Body Dressing : Moderate assistance;Sitting   Lower Body Dressing: Maximal assistance;Sit to/from stand;Cueing for sequencing   Toilet Transfer: Minimal assistance;Stand-pivot;RW;BSC   Toileting- Clothing Manipulation and Hygiene: Cueing for safety;Cueing for sequencing;Minimal assistance Toileting - Clothing Manipulation Details (indicate cue type and reason): pt propped RLE on bed and leaned back on [facility] for toilet hygiene.VC's for safety.  Tub/ Shower Transfer: Total assistance   Functional mobility during ADLs:  (did not assess this date) General ADL Comments: Pt reluctant to work with therapy, however after bedside evaluation pt requested to use bathroom. Pt asked to use bedpan, however encouraged pt to exercise Bil LEs through standing and transferring to [facility]. Pt performed transfer with Min A and bearing weight through both legs to stand fully upright. Pt demonstrates slow processing and difficult to determine cognitive status with unknown baseline.      Vision  Per pt report, vision is blurry. Pt unable to explain vision.   Visual assessment to be further assessed in functional context.  Pt denies diploplia; +nystagmus and closes R eye to read wall clock/calendar.  Additional Comments: +nystagmus, pt with difficulty following directions for visual assessment. Jerky vertical and horizontal tracking. To be further assessed.   Perception  Perception Perception Tested?: No   Praxis Praxis Praxis tested?: Within functional limits    Pertinent Vitals/Pain No c/o pain.     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 Pt reports it makes handwriting and self-feeding difficult. RUE Sensation:  (pt reports that R hand tickles. Difficult to determine if th) RUE Coordination: decreased fine motor;decreased gross motor   Lower Extremity Assessment Lower Extremity Assessment: Defer to PT evaluation   Cervical / Trunk Assessment Cervical / Trunk Assessment: Normal   Communication Communication Communication: Other (comment) (somewhat slurred speech)   Cognition Arousal/Alertness: Awake/alert Behavior During Therapy: WFL for tasks assessed/performed Overall Cognitive Status: Impaired/Different from baseline (No family to determine baseline) Area of Impairment: Orientation;Attention;Memory;Following commands;Safety/judgement;Awareness;Problem solving Orientation Level: Disoriented to;Place;Time (pt states she is here because "I fell") Current Attention Level: Sustained Memory: Decreased short-term memory Following Commands: Follows one step commands with increased time Safety/Judgement: Decreased awareness of safety;Decreased awareness of deficits Awareness: Intellectual Problem Solving: Slow processing;Decreased initiation;Difficulty sequencing;Requires verbal cues;Requires tactile cues General Comments: Difficult to determine cognitive status due to unknown baseline. Attempted to call pt's son with no success.               Home Living Family/patient expects to be discharged to:: Private residence Living Arrangements: Children (lives with adult son) Available Help at Discharge: Family;Available 24 hours/day Type of Home:  Apartment Home Access: Level entry     Home Layout: One level     Bathroom Shower/Tub: Chief Strategy Officer: Standard     Home Equipment: Environmental consultant - 2 wheels;Cane - single point   Additional Comments: Pt poor historian, appears confused. No family present. Attempted to contact son Olga Millers") without success.       Prior Functioning/Environment Level of Independence: Needs assistance  Gait / Transfers Assistance Needed: per pt, uses RW at home to get around. Pt states "I want a wheelchair so I can go further." ADL's / Homemaking Assistance Needed: per pt report, son and his girlfriend assist with bathing and dressing (LB) and IADLs.  Communication / Swallowing Assistance Needed: Pt with noted slurred speech, slow to respond to questions. ADmitted w/ AMS & no family present to verify. Comments: pt reports falling several times when asked and states that her son helps her up. Unable to confirm with family.     OT Diagnosis: Generalized weakness;Cognitive deficits;Disturbance of vision;Acute pain;Ataxia   OT Problem List: Decreased strength;Decreased activity tolerance;Impaired balance (sitting and/or standing);Impaired vision/perception;Decreased cognition;Decreased safety awareness;Decreased knowledge of use of DME or AE;Impaired UE functional use   OT Treatment/Interventions: Self-care/ADL training;Therapeutic exercise;Neuromuscular education;Energy conservation;DME and/or AE instruction;Therapeutic activities;Cognitive remediation/compensation;Patient/family education;Balance training;Visual/perceptual remediation/compensation    OT Goals(Current goals can be found in the care plan section) Acute Rehab OT Goals Patient Stated Goal: to go home with my son OT Goal Formulation: With patient Time For Goal Achievement: 07/12/13 Potential to Achieve Goals: Good ADL Goals Pt Will Perform Grooming: with modified independence;standing Pt Will Transfer to Toilet: with modified  independence;ambulating;bedside commode Pt Will Perform Toileting - Clothing Manipulation and hygiene: with modified independence;sit to/from stand Pt Will Perform Tub/Shower Transfer: Tub transfer;with supervision;ambulating;3 in 1;rolling walker Additional ADL Goal #1: Pt will perform bed mobility with Mod Independence to prepare for ADLs.  OT Frequency: Min 3X/week   Barriers to D/C: Other (comment)  Unclear of assistance at home.          End of Session Equipment Utilized During Treatment: Gait belt;Rolling walker Nurse Communication: Other (comment) (pt urinated 5 oz. )  Activity Tolerance: Patient tolerated treatment well Patient left: in bed;with call bell/phone within reach;with bed alarm set   Time: 1510-1609 OT Time Calculation (min): 59 min Charges:  OT General Charges $OT Visit: 1 Procedure OT Evaluation $Initial OT Evaluation Tier I: 1 Procedure OT Treatments $Self Care/Home Management : 23-37 mins $Therapeutic Activity: 8-22 mins  Rae LipsMiller, Gidget Quizhpi M [phone] 07/05/2013, 4:27 PM

## 2013-07-06 ENCOUNTER — Ambulatory Visit: Payer: Medicare Other | Admitting: Neurology

## 2013-07-06 ENCOUNTER — Encounter (HOSPITAL_COMMUNITY): Payer: Self-pay | Admitting: General Practice

## 2013-07-06 LAB — HEPATIC FUNCTION PANEL
ALT: 13 U/L (ref 0–35)
AST: 11 U/L (ref 0–37)
Albumin: 3.5 g/dL (ref 3.5–5.2)
Alkaline Phosphatase: 79 U/L (ref 39–117)
BILIRUBIN TOTAL: 0.8 mg/dL (ref 0.3–1.2)
Bilirubin, Direct: <0.2 mg/dL (ref 0.0–0.3)
Total Protein: 6.8 g/dL (ref 6.0–8.3)

## 2013-07-06 LAB — BASIC METABOLIC PANEL
BUN: 8 mg/dL (ref 6–23)
CHLORIDE: 110 meq/L (ref 96–112)
CO2: 23 mEq/L (ref 19–32)
Calcium: 9.5 mg/dL (ref 8.4–10.5)
Creatinine, Ser: 0.48 mg/dL — ABNORMAL LOW (ref 0.50–1.10)
GFR calc Af Amer: >90 mL/min (ref >90)
Glucose, Bld: 170 mg/dL — ABNORMAL HIGH (ref 70–99)
POTASSIUM: 3.9 meq/L (ref 3.7–5.3)
SODIUM: 145 meq/L (ref 137–147)

## 2013-07-06 LAB — GLUCOSE, CAPILLARY
GLUCOSE-CAPILLARY: 154 mg/dL — AB (ref 70–99)
GLUCOSE-CAPILLARY: 171 mg/dL — AB (ref 70–99)
Glucose-Capillary: 127 mg/dL — ABNORMAL HIGH (ref 70–99)
Glucose-Capillary: 250 mg/dL — ABNORMAL HIGH (ref 70–99)

## 2013-07-06 MED ORDER — SODIUM CHLORIDE 0.45 % IV SOLN
INTRAVENOUS | Status: DC
  Administered 2013-07-06 (×2): via INTRAVENOUS

## 2013-07-06 NOTE — Progress Notes (Signed)
PROGRESS NOTE  [name] [name] YBW:[id number] DOB: 1970/08/19 DOA: 07/03/2013 PCP: Jeanann Lewandowsky, MD   Brief History  43 y.o. female with PMH of HTN, DM, MS presented with frequent falls; Pt is not well ambulatory due to MS, but per her son she has been falling a lot, recently has R sided facial injury due to fall; Patient also reports having nausea, vomiting and diarrhea x 1 day, no abd pain, no hematemesis; since admission, the patient's vomiting and diarrhea have resolved. In fact her diet has been advanced which she has tolerated. Neurology was consulted to see the patient. MRI of the brain was consistent with acute demyelination concerning for multiple sclerosis exacerbation. The patient was started on Solu-Medrol IV.  Assessment/Plan:  MS Exacerbation  -accounting for her frequent falls and weakness  -CT head: chronic changes; no s/s of infection; afebrile; no leukocytosis;  -MRI brain shows enhancing white matter lesions c/w active demyelination  -IV solumedrol per neurology--Day #3 of 5  -appreciate neuro f/u  -EEG neg for seizure predisposition  Nausea, vomiting  -? Gastroenteritis; vs metformin related;  -abd exam benign; no s/s of acute abdomen  -improved since admission--no further vomiting, feeling hungry--tolerating diet  -change IVF to 1/2NS  Hyperbilirubinemia  -?Gilbert's vs cholestasis -has been chronically mildly elevated  -fractionate bili-->unable as bili has improved -abd [place] neg for cholecystitis or ductal dilatation  Hypokalemia  -likely due to GI loss; IV replace  -improved  DM2, controlled  -last HA1-5.2 on 05/10/13  -continue CBGs due to steroids  HTN  -Stable  -continue metoprolol tartrate  -decrease amlodipine dose  L facial trauma;  -CT: -Periapical lucency involving the 3rd left mandibular molar (tooth  16), query loosening versus abscess-->orthopantogram--unable to perform due to inability to stand without assistance  -no obvious  drainable abscess on exam -Needs outpatient dental followup; presently no pain Multinodular goiter.  -Dominant 2.4 cm thyroid nodule  -obtain TSH--0.083, free t4--1.65  -thyroid US--confirms multinodular goiter  -may have a degree of subclinical hyperthyroidsm, but recent steroids may have influenced results  -outpt followup  Family Communication: Pt at beside  Disposition Plan: Home when medically stable   Procedures/Studies: Dg Chest 2 View  07/03/2013   CLINICAL DATA:  Traumatic injury and pain  EXAM: CHEST  2 VIEW  COMPARISON:  06/02/2013  FINDINGS: The heart size and mediastinal contours are within normal limits. Both lungs are clear. The visualized skeletal structures are unremarkable.  IMPRESSION: No active cardiopulmonary disease.   Electronically Signed   By: Alcide Clever M.D.   On: 07/03/2013 13:18   Ct Head Wo Contrast  07/03/2013   CLINICAL DATA:  Current history of multiple sclerosis. Patient fell forward onto the floor, face 1st, sustaining a laceration to the lip. Brief loss of consciousness.  EXAM: CT HEAD WITHOUT CONTRAST  CT MAXILLOFACIAL WITHOUT CONTRAST  CT CERVICAL SPINE WITHOUT CONTRAST  TECHNIQUE: Multidetector CT imaging of the head, cervical spine, and maxillofacial structures were performed using the standard protocol without intravenous contrast. Multiplanar CT image reconstructions of the cervical spine and maxillofacial structures were also generated.  COMPARISON:  MRI brain 06/04/2013, 09/06/2012. CT head 06/03/2013, 05/28/2012, 03/15/2010.  FINDINGS: CT HEAD FINDINGS  Patient's head is turned to the right. Ventricular system normal in size and appearance for age. Severe changes of small vessel disease of the white matter consistent with given history of MS, unchanged. No mass lesion. No midline shift. No acute hemorrhage or hematoma.  No extra-axial fluid collections. No evidence of acute infarction.  No skull fracture or other focal osseous abnormality involving the  skull. Bilateral mastoid air cells and middle ear cavities well aerated. Mild bilateral carotid siphon atherosclerosis.  CT MAXILLOFACIAL FINDINGS  Soft tissue swelling/ecchymosis involving the lower left cheek, overlying the left maxilla. No fractures identified involving the facial bones. Temporomandibular joints intact. Periapical lucency involving the 3rd molar in the left side of the mandible (tooth 16).  Bony nasal septum midline. Paranasal sinuses well aerated. Left concha bullosa.  Both orbits and both globes intact.  CT CERVICAL SPINE FINDINGS  Patient's head is turned to the right, accounting for the rotation at C1-C2. No fractures identified involving the cervical spine. Soft tissue window images demonstrate no frank disc protrusions. Sagittal reconstructed images demonstrate straightening of the usual lordosis with anatomic posterior alignment. Mild disc space narrowing at C4-5 and C5-6. Borderline spinal stenosis at C4-5. Facet joints intact throughout without significant degenerative change. Coronal reformatted images demonstrate an intact craniocervical junction, intact C1-C2 articulation, intact dens, and intact lateral masses throughout. No significant bony foraminal stenoses.  Visualized lung apices clear. Thyroid gland enlarged with multiple nodules, the largest approximating 2.4 cm.  IMPRESSION: 1. No acute intracranial abnormality. 2. Severe changes of small vessel disease of the white matter consistent with the given diagnosis of MS, not significantly changed since 2012. 3. No facial bone fractures. 4. Periapical lucency involving the 3rd left mandibular molar (tooth 16), query loosening versus abscess. 5. No cervical spine fractures. 6. Mild degenerative disc disease at C4-5 and C5-6 with borderline spinal stenosis at C4-5. 7. Multinodular goiter. Dominant 2.4 cm thyroid nodule. Consider further evaluation with nonemergent thyroid ultrasound. If patient is clinically hyperthyroid, consider  nuclear medicine thyroid uptake and scan.   Electronically Signed   By: Hulan Saas M.D.   On: 07/03/2013 13:32   Ct Cervical Spine Wo Contrast  07/03/2013   CLINICAL DATA:  Current history of multiple sclerosis. Patient fell forward onto the floor, face 1st, sustaining a laceration to the lip. Brief loss of consciousness.  EXAM: CT HEAD WITHOUT CONTRAST  CT MAXILLOFACIAL WITHOUT CONTRAST  CT CERVICAL SPINE WITHOUT CONTRAST  TECHNIQUE: Multidetector CT imaging of the head, cervical spine, and maxillofacial structures were performed using the standard protocol without intravenous contrast. Multiplanar CT image reconstructions of the cervical spine and maxillofacial structures were also generated.  COMPARISON:  MRI brain 06/04/2013, 09/06/2012. CT head 06/03/2013, 05/28/2012, 03/15/2010.  FINDINGS: CT HEAD FINDINGS  Patient's head is turned to the right. Ventricular system normal in size and appearance for age. Severe changes of small vessel disease of the white matter consistent with given history of MS, unchanged. No mass lesion. No midline shift. No acute hemorrhage or hematoma. No extra-axial fluid collections. No evidence of acute infarction.  No skull fracture or other focal osseous abnormality involving the skull. Bilateral mastoid air cells and middle ear cavities well aerated. Mild bilateral carotid siphon atherosclerosis.  CT MAXILLOFACIAL FINDINGS  Soft tissue swelling/ecchymosis involving the lower left cheek, overlying the left maxilla. No fractures identified involving the facial bones. Temporomandibular joints intact. Periapical lucency involving the 3rd molar in the left side of the mandible (tooth 16).  Bony nasal septum midline. Paranasal sinuses well aerated. Left concha bullosa.  Both orbits and both globes intact.  CT CERVICAL SPINE FINDINGS  Patient's head is turned to the right, accounting for the rotation at C1-C2. No fractures identified involving the cervical spine. Soft tissue window  images  demonstrate no frank disc protrusions. Sagittal reconstructed images demonstrate straightening of the usual lordosis with anatomic posterior alignment. Mild disc space narrowing at C4-5 and C5-6. Borderline spinal stenosis at C4-5. Facet joints intact throughout without significant degenerative change. Coronal reformatted images demonstrate an intact craniocervical junction, intact C1-C2 articulation, intact dens, and intact lateral masses throughout. No significant bony foraminal stenoses.  Visualized lung apices clear. Thyroid gland enlarged with multiple nodules, the largest approximating 2.4 cm.  IMPRESSION: 1. No acute intracranial abnormality. 2. Severe changes of small vessel disease of the white matter consistent with the given diagnosis of MS, not significantly changed since 2012. 3. No facial bone fractures. 4. Periapical lucency involving the 3rd left mandibular molar (tooth 16), query loosening versus abscess. 5. No cervical spine fractures. 6. Mild degenerative disc disease at C4-5 and C5-6 with borderline spinal stenosis at C4-5. 7. Multinodular goiter. Dominant 2.4 cm thyroid nodule. Consider further evaluation with nonemergent thyroid ultrasound. If patient is clinically hyperthyroid, consider nuclear medicine thyroid uptake and scan.   Electronically Signed   By: Hulan Saas M.D.   On: 07/03/2013 13:32   Mr Laqueta Jean WU Contrast  07/03/2013   CLINICAL DATA:  MS exacerbation versus CVA  EXAM: MRI HEAD WITHOUT AND WITH CONTRAST  TECHNIQUE: Multiplanar, multiecho pulse sequences of the brain and surrounding structures were obtained without and with intravenous contrast.  CONTRAST:  12mL MULTIHANCE GADOBENATE DIMEGLUMINE 529 MG/ML IV SOLN  COMPARISON:  Prior MRI from 06/04/2013.  FINDINGS: Diffuse cerebral atrophy is unchanged. No mass lesion, midline shift, or extra-axial fluid collection. Ventricles are normal in size without evidence of hydrocephalus.  Multiple T2/FLAIR hyperdensities  again seen throughout the cerebral white matter bilaterally, compatible with known history of multiple sclerosis. Multiple cystic areas of demyelinization again noted. Involvement of the central pons and brachium pontis bilaterally, right greater than left again noted as well. On post-contrast sequences, many newly active lesions demonstrate incomplete rings of peripheral enhancement, compatible with active demyelination. For reference purposes, the largest of these lesions within the left cerebral hemisphere is located within the deep/subcortical white matter of the left frontoparietal region and measures 2.0 x 0.9 cm (series 12, image 28). The largest lesion within the right cerebral hemisphere is seen within the right centrum semi ovale and measures approximately 6 mm (series 12, image 35). Multiple additional punctate areas of enhancement seen within the deep white matter of the bilateral frontal lobes (series 12, image 29, 28, 27). There is a 5 mm enhancing lesion within the right corpus callosum (series 12, image 25). Large enhancing lesion within the right pons measures 1.0 x 1.4 cm (series 12, image 15). Additional enhancing lesion present within the right middle cerebellar peduncle.  Several punctate foci of enhancement seen within the partially visualized upper cervical spinal cord at the level of C2 and C3 (series 14, image 11).  No diffusion-weighted signal abnormality is identified to suggest acute intracranial infarct. Gray-white matter differentiation is maintained. Normal flow voids are seen within the intracranial vasculature. No intracranial hemorrhage identified.  The cervicomedullary junction is normal. Pituitary gland is within normal limits. Pituitary stalk is midline. The globes and optic nerves demonstrate a normal appearance with normal signal intensity.  The bone marrow signal intensity is normal. Calvarium is intact. Visualized upper cervical spine is within normal limits.  Scalp soft  tissues are unremarkable.  Paranasal sinuses are clear.  No mastoid effusion.  IMPRESSION: 1. Findings consistent with acute multiple sclerosis exacerbation with multiple enhancing white matter  lesions within the supratentorial and infratentorial brain as above, compatible with active demyelination. 2. Punctate foci of enhancement within the upper cervical spinal cord at the level of C2 and C3, also consistent with active demyelination. 3. No evidence of acute intracranial infarct.   Electronically Signed   By: Rise Mu M.D.   On: 07/03/2013 23:14   US Soft Tissue Head/neck  07/03/2013   CLINICAL DATA:  Evaluate thyroid nodules  EXAM: THYROID ULTRASOUND  TECHNIQUE: Ultrasound examination of the thyroid gland and adjacent soft tissues was performed.  COMPARISON:  None.  FINDINGS: Right thyroid lobe  Measurements: 5.2 x 2.2 by 2.2 cm. There are several solid nodules within the right lobe. The largest is in the upper pole measuring 2.4 x 1.5 x 2.4 cm. This is in the mid portion of the right lobe adjacent to the isthmus. There are no microcalcifications or hypervascularity associated with this well-circumscribed nodule.  Left thyroid lobe  Measurements: 3.6 x 1.6 x 1.1 cm. Multiple sub cm nodules are identified within the left lobe. The largest is in the inferior pole measuring 1 x 0.5 x 0.9 cm.  Isthmus  Thickness: The isthmus measures 0.8 cm.  Lymphadenopathy  None visualized.  IMPRESSION: 1. Multi nodular thyroid gland. The largest nodule is in the upper pole of the right lobe. Findings meet consensus criteria for biopsy. Ultrasound-guided fine needle aspiration should be considered, as per the consensus statement: Management of Thyroid Nodules Detected at [place]: Society of Radiologists in Ultrasound Consensus Conference Statement. Radiology 2005; [id number]. 2. No adenopathy identified.   Electronically Signed   By: Signa Kell M.D.   On: 07/03/2013 18:42   US Abdomen Complete  07/03/2013    CLINICAL DATA:  Elevated bilirubin  EXAM: ULTRASOUND ABDOMEN COMPLETE  COMPARISON:  Acute abdominal series 05/09/2013.  FINDINGS: Gallbladder:  Shadowing gallstones are present. No sonographic Eulah Pont sign is reported. Wall thickness is within normal limits at 2 mm.  Common bile duct:  Diameter: 3.0 mm, within normal limits.  Liver:  No focal lesion identified. Within normal limits in parenchymal echogenicity.  IVC:  No abnormality visualized.  Pancreas:  Visualized portion unremarkable.  Spleen:  Size and appearance within normal limits.  Right Kidney:  Length: 10.7 cm, within normal limits. Echogenicity within normal limits. No mass or hydronephrosis visualized.  Left Kidney:  Length: 9.6 cm, within normal limits. The left kidney is poorly visualized. No definite mass lesion or hydronephrosis is evident.  Abdominal aorta:  2.1 cm, within normal limits.  Other findings:  1. Cholelithiasis without evidence for cholecystitis. 2. No acute abnormality.   Electronically Signed   By: Gennette Pac M.D.   On: 07/03/2013 18:38   Ct Maxillofacial Wo Cm  07/03/2013   CLINICAL DATA:  Current history of multiple sclerosis. Patient fell forward onto the floor, face 1st, sustaining a laceration to the lip. Brief loss of consciousness.  EXAM: CT HEAD WITHOUT CONTRAST  CT MAXILLOFACIAL WITHOUT CONTRAST  CT CERVICAL SPINE WITHOUT CONTRAST  TECHNIQUE: Multidetector CT imaging of the head, cervical spine, and maxillofacial structures were performed using the standard protocol without intravenous contrast. Multiplanar CT image reconstructions of the cervical spine and maxillofacial structures were also generated.  COMPARISON:  MRI brain 06/04/2013, 09/06/2012. CT head 06/03/2013, 05/28/2012, 03/15/2010.  FINDINGS: CT HEAD FINDINGS  Patient's head is turned to the right. Ventricular system normal in size and appearance for age. Severe changes of small vessel disease of the white matter consistent with given history of MS, unchanged.  No mass lesion. No midline shift. No acute hemorrhage or hematoma. No extra-axial fluid collections. No evidence of acute infarction.  No skull fracture or other focal osseous abnormality involving the skull. Bilateral mastoid air cells and middle ear cavities well aerated. Mild bilateral carotid siphon atherosclerosis.  CT MAXILLOFACIAL FINDINGS  Soft tissue swelling/ecchymosis involving the lower left cheek, overlying the left maxilla. No fractures identified involving the facial bones. Temporomandibular joints intact. Periapical lucency involving the 3rd molar in the left side of the mandible (tooth 16).  Bony nasal septum midline. Paranasal sinuses well aerated. Left concha bullosa.  Both orbits and both globes intact.  CT CERVICAL SPINE FINDINGS  Patient's head is turned to the right, accounting for the rotation at C1-C2. No fractures identified involving the cervical spine. Soft tissue window images demonstrate no frank disc protrusions. Sagittal reconstructed images demonstrate straightening of the usual lordosis with anatomic posterior alignment. Mild disc space narrowing at C4-5 and C5-6. Borderline spinal stenosis at C4-5. Facet joints intact throughout without significant degenerative change. Coronal reformatted images demonstrate an intact craniocervical junction, intact C1-C2 articulation, intact dens, and intact lateral masses throughout. No significant bony foraminal stenoses.  Visualized lung apices clear. Thyroid gland enlarged with multiple nodules, the largest approximating 2.4 cm.  IMPRESSION: 1. No acute intracranial abnormality. 2. Severe changes of small vessel disease of the white matter consistent with the given diagnosis of MS, not significantly changed since 2012. 3. No facial bone fractures. 4. Periapical lucency involving the 3rd left mandibular molar (tooth 16), query loosening versus abscess. 5. No cervical spine fractures. 6. Mild degenerative disc disease at C4-5 and C5-6 with  borderline spinal stenosis at C4-5. 7. Multinodular goiter. Dominant 2.4 cm thyroid nodule. Consider further evaluation with nonemergent thyroid ultrasound. If patient is clinically hyperthyroid, consider nuclear medicine thyroid uptake and scan.   Electronically Signed   By: Hulan Saas M.D.   On: 07/03/2013 13:32         Subjective: Patient denies fevers, chills, headache, chest pain, dyspnea, nausea, vomiting, diarrhea, abdominal pain, dysuria, hematuria   Objective: Filed Vitals:   07/05/13 1320 07/05/13 2131 07/06/13 0646 07/06/13 1300  BP: 129/89 136/79 145/91   Pulse: 81 88 94   Temp: 98.1 F (36.7 C) 98.2 F (36.8 C) 98 F (36.7 C)   TempSrc: Oral Oral Oral   Resp: 17 16 16    Height:    5\' 1"  (1.549 m)  SpO2: 98% 100% 100%     Intake/Output Summary (Last 24 hours) at 07/06/13 1414 Last data filed at 07/06/13 1300  Gross per 24 hour  Intake 915.75 ml  Output   3475 ml  Net -2559.25 ml   Weight change:  Exam:   General:  Pt is alert, follows commands appropriately, not in acute distress  HEENT: No icterus, No thrush,  [place]/AT  Cardiovascular: RRR, S1/S2, no rubs, no gallops  Respiratory: CTA bilaterally, no wheezing, no crackles, no rhonchi  Abdomen: Soft/+BS, non tender, non distended, no guarding  Extremities: No edema, No lymphangitis, No petechiae, No rashes, no synovitis  Data Reviewed: Basic Metabolic Panel:  Recent Labs Lab 07/03/13 1155 07/03/13 1208 07/04/13 0623 07/05/13 0431 07/06/13 0719  NA 146  --  142 145 145  K 2.9*  --  4.0 4.3 3.9  CL 104  --  103 109 110  CO2 24  --  24 22 23   GLUCOSE 129*  --  103* 158* 170*  BUN 8  --  6 5* 8  CREATININE 0.52  --  0.58 0.46* 0.48*  CALCIUM 10.0  --  9.4 9.5 9.5  MG  --  2.0  --   --   --    Liver Function Tests:  Recent Labs Lab 07/03/13 1155 07/04/13 0623 07/05/13 0431 07/06/13 0719  AST 19 24 12 11   ALT 15 12 12 13   ALKPHOS 91 79 73 79  BILITOT 1.8* 2.2* 1.2 0.8  PROT  7.9 7.2 6.8 6.8  ALBUMIN 4.5 3.9 3.5 3.5    Recent Labs Lab 07/03/13 1919  LIPASE 46   No results found for this basename: AMMONIA,  in the last 168 hours CBC:  Recent Labs Lab 07/03/13 1155  WBC 7.9  NEUTROABS 5.8  HGB 14.6  HCT 42.1  MCV 92.9  PLT 281   Cardiac Enzymes:  Recent Labs Lab 07/03/13 1208  TROPONINI <0.30   BNP: No components found with this basename: POCBNP,  CBG:  Recent Labs Lab 07/05/13 1214 07/05/13 1647 07/05/13 2130 07/06/13 0806 07/06/13 1255  GLUCAP 137* 234* 171* 154* 127*    No results found for this or any previous visit (from the past 240 hour(s)).   Scheduled Meds: . amLODipine  5 mg Oral Daily  . aspirin EC  81 mg Oral Daily  . enoxaparin (LOVENOX) injection  40 mg Subcutaneous Q24H  . insulin aspart  0-9 Units Subcutaneous TID WC  . methylPREDNISolone (SOLU-MEDROL) injection  1,000 mg Intravenous Daily  . metoprolol tartrate  25 mg Oral BID  . potassium chloride SA  20 mEq Oral Daily   Continuous Infusions: . sodium chloride 75 mL/hr at 07/06/13 0133     Nathon Stefanski, DO  [facility] Pager [phone]  If 7PM-7AM, please contact night-coverage [email or web address] Password [email or web address] 07/06/2013, 2:14 PM   LOS: 3 days

## 2013-07-06 NOTE — Clinical Social Work Note (Signed)
CSW consulted for advances directives assistance. Per pt's chart review, pt is currently not alert and oriented x4 prohibiting pt from participating in advance directives. CSW signing off. Thank you for the referral.  [name] [name], MSW, [facility] Licensed Clinical Social Worker [phone] and [phone]

## 2013-07-06 NOTE — Progress Notes (Signed)
Subjective: No significant changes overnight.   Exam: Filed Vitals:   07/06/13 0646  BP: 145/91  Pulse: 94  Temp: 98 F (36.7 C)  Resp: 16   Gen: In bed, NAD MS: Awake, alert, oriented to place, but not month or year.  Motor: continues to show some improvement in left arm weakness Cerebellar: ataxia still present bilaterally, R > L but somewhat improved.  Sensory:intact to LT  Impression: 43 yo F with MS flare.   Recommendations: 1) solumedrol 1gm daily for 5 days. Day 3/5 2) PT,OT  Ritta SlotMcNeill Kirkpatrick, MD [facility] [phone]  If 7pm- 7am, please page neurology on call as listed in AMION.

## 2013-07-06 NOTE — Progress Notes (Signed)
Physical Therapy Treatment Patient Details Name: [name] [name] MRN: [id number] DOB: [birth year] Today's Date: 07/06/2013    History of Present Illness Pt is [name] admitted 07/03/13 for MS exacerbation and accompanying fall with L facial trauma and sore L arm.     PT Comments    Patient required max encouragement to ambulation.Patient appears very fearful of falling Patient may benefit from wheelchair at home depending progress. Will continue to follow  Follow Up Recommendations  Home health PT;Supervision/Assistance - 24 hour     Equipment Recommendations  None recommended by PT    Recommendations for Other Services       Precautions / Restrictions Precautions Precautions: Fall    Mobility  Bed Mobility Overal bed mobility: Needs Assistance Bed Mobility: Supine to Sit     Supine to sit: Min assist     General bed mobility comments: VC's for sequencing, truncal assist for supine>sit  Transfers Overall transfer level: Needs assistance Equipment used: Rolling walker (2 wheeled)   Sit to Stand: Min assist         General transfer comment: VC's for hand placement. Adjust for power up into stand and to ensure balance  Ambulation/Gait   Ambulation Distance (Feet): 8 Feet Assistive device: Rolling walker (2 wheeled) Gait Pattern/deviations: Step-to pattern;Decreased step length - right;Decreased step length - left;Narrow base of support;Shuffle     General Gait Details: Short steps with max encouragement required to attempt ambulation   Stairs            Wheelchair Mobility    Modified Rankin (Stroke Patients Only)       Balance     Sitting balance-Leahy Scale: Fair       Standing balance-Leahy Scale: Poor                      Cognition Arousal/Alertness: Awake/alert Behavior During Therapy: WFL for tasks assessed/performed Overall Cognitive Status: Impaired/Different from baseline       Memory: Decreased short-term  memory Following Commands: Follows one step commands with increased time Safety/Judgement: Decreased awareness of safety;Decreased awareness of deficits   Problem Solving: Slow processing;Decreased initiation;Difficulty sequencing;Requires verbal cues;Requires tactile cues      Exercises      General Comments        Pertinent Vitals/Pain Denied pain    Home Living Family/patient expects to be discharged to:: Private residence Living Arrangements: Children;Other relatives                  Prior Function            PT Goals (current goals can now be found in the care plan section) Progress towards PT goals: Progressing toward goals    Frequency  Min 3X/week    PT Plan Current plan remains appropriate    Co-evaluation             End of Session   Activity Tolerance: Patient limited by fatigue Patient left: in chair;with call bell/phone within reach     Time: 0943-0959 PT Time Calculation (min): 16 min  Charges:  $Therapeutic Activity: 8-22 mins                    G Codes:      Fredrich Birks 07/06/2013, 2:50 PM  07/06/2013 Fredrich Birks PTA [phone] pager [phone] office  07/06/2013 Fredrich Birks PTA [phone] pager [phone] office

## 2013-07-06 NOTE — Evaluation (Signed)
Occupational Therapy Evaluation Patient Details Name: [name] [name] MRN: [id number] DOB: 1970/04/28 Today's Date: 07/06/2013    History of Present Illness Pt is [name] admitted 07/03/13 for MS exacerbation and accompanying fall with L facial trauma and sore L arm.    Clinical Impression   Pt progressing with OT.  She indicates that her son will be able to provide 24 hour assist at home and the plan is to return home.  Feel CIR might be a good option to allow her to regain maximal independence if son is planning to take her home.     Follow Up Recommendations  CIR;Supervision/Assistance - 24 hour    Equipment Recommendations  3 in 1 bedside comode    Recommendations for Other Services Rehab consult     Precautions / Restrictions Precautions Precautions: Fall Restrictions Weight Bearing Restrictions: No      Mobility Bed Mobility                  Transfers Overall transfer level: Needs assistance Equipment used: None Transfers: Sit to/from Stand;Stand Pivot Transfers Sit to Stand: Min assist Stand pivot transfers: Min assist       General transfer comment: VC's for hand placement and assist to steady    Balance Overall balance assessment: Needs assistance Sitting-balance support: Feet supported Sitting balance-Leahy Scale: Fair     Standing balance support: Single extremity supported Standing balance-Leahy Scale: Poor                              ADL Overall ADL's : Needs assistance/impaired Eating/Feeding: Set up;Sitting Eating/Feeding Details (indicate cue type and reason): Pt able to feed self without spills                     Toilet Transfer: Minimal assistance;Stand-pivot;RW;BSC Toilet Transfer Details (indicate cue type and reason): Requires min a for balance from [facility] to recliner.   Toileting- Clothing Manipulation and Hygiene: Total assistance;Sit to/from stand               Vision                      Perception     Praxis      Pertinent Vitals/Pain Denies pain     Hand Dominance     Extremity/Trunk Assessment             Communication     Cognition Arousal/Alertness: Awake/alert Behavior During Therapy: Flat affect Overall Cognitive Status: Impaired/Different from baseline Area of Impairment: Attention;Safety/judgement;Awareness;Problem solving;Memory   Current Attention Level: Sustained Memory: Decreased short-term memory Following Commands: Follows one step commands with increased time Safety/Judgement: Decreased awareness of safety;Decreased awareness of deficits   Problem Solving: Slow processing;Decreased initiation;Difficulty sequencing;Requires verbal cues;Requires tactile cues     General Comments       Exercises       Shoulder Instructions      Home Living                                          Prior Functioning/Environment               OT Diagnosis:     OT Problem List:     OT Treatment/Interventions:      OT Goals(Current goals can be found in  the care plan section) ADL Goals Pt Will Perform Grooming: with modified independence;standing Pt Will Transfer to Toilet: with modified independence;ambulating;bedside commode Pt Will Perform Toileting - Clothing Manipulation and hygiene: with modified independence;sit to/from stand Pt Will Perform Tub/Shower Transfer: Tub transfer;with supervision;ambulating;3 in 1;rolling walker Additional ADL Goal #1: Pt will perform bed mobility with Mod Independence to prepare for ADLs.   OT Frequency: Min 3X/week   Barriers to D/C:            Co-evaluation              End of Session    Activity Tolerance: Patient tolerated treatment well Patient left: in chair;with chair alarm set   Time: 1400-1415 OT Time Calculation (min): 15 min Charges:  OT General Charges $OT Visit: 1 Procedure OT Treatments $Self Care/Home Management : 8-22 mins G-Codes:     [name] HawkingConarpe, [name] [name] 07/06/2013, 9:47 PM

## 2013-07-07 DIAGNOSIS — R29898 Other symptoms and signs involving the musculoskeletal system: Secondary | ICD-10-CM | POA: Diagnosis not present

## 2013-07-07 DIAGNOSIS — I10 Essential (primary) hypertension: Secondary | ICD-10-CM | POA: Diagnosis not present

## 2013-07-07 DIAGNOSIS — G35 Multiple sclerosis: Secondary | ICD-10-CM | POA: Diagnosis not present

## 2013-07-07 DIAGNOSIS — F17200 Nicotine dependence, unspecified, uncomplicated: Secondary | ICD-10-CM | POA: Diagnosis not present

## 2013-07-07 LAB — BASIC METABOLIC PANEL
BUN: 13 mg/dL (ref 6–23)
CALCIUM: 9.1 mg/dL (ref 8.4–10.5)
CO2: 23 meq/L (ref 19–32)
CREATININE: 0.52 mg/dL (ref 0.50–1.10)
Chloride: 105 mEq/L (ref 96–112)
GFR calc Af Amer: >90 mL/min (ref >90)
GLUCOSE: 160 mg/dL — AB (ref 70–99)
Potassium: 3.6 mEq/L — ABNORMAL LOW (ref 3.7–5.3)
Sodium: 141 mEq/L (ref 137–147)

## 2013-07-07 LAB — GLUCOSE, CAPILLARY
GLUCOSE-CAPILLARY: 141 mg/dL — AB (ref 70–99)
Glucose-Capillary: 207 mg/dL — ABNORMAL HIGH (ref 70–99)
Glucose-Capillary: 191 mg/dL — ABNORMAL HIGH (ref 70–99)
Glucose-Capillary: 196 mg/dL — ABNORMAL HIGH (ref 70–99)

## 2013-07-07 MED ORDER — AMLODIPINE BESYLATE 5 MG PO TABS: 5.0000 mg | ORAL_TABLET | Freq: Every day | ORAL | Status: DC

## 2013-07-07 NOTE — Progress Notes (Signed)
Rehab Admissions Coordinator Note:  Patient was screened by Trish Mage for appropriateness for an Inpatient Acute Rehab Consult.  At this time, we are recommending Inpatient Rehab consult.  Trish Mage 07/07/2013, 8:34 AM  I can be reached at [phone].

## 2013-07-07 NOTE — Progress Notes (Signed)
Subjective: Continues to have improvemetn.   Exam: Filed Vitals:   07/07/13 1435  BP: 136/82  Pulse: 74  Temp: 98 F (36.7 C)  Resp: 18   Gen: In bed, NAD MS: Awake, alert, oriented to place, but not month or year.  Motor: continues to show some improvement in left arm weakness Cerebellar: ataxia mostly resolved, but still somein the right arm.   Sensory:intact to LT  Impression: 43 yo F with MS flare.   Recommendations: 1) solumedrol 1gm daily for 5 days. Day 4/5 2) PT,OT  Ritta Slot, MD [facility] [phone]  If 7pm- 7am, please page neurology on call as listed in [facility].

## 2013-07-07 NOTE — Progress Notes (Signed)
Patient's son at bedside today asking about information regarding Advanced Directives.  Referred to Child psychotherapist.  Social work Occupational hygienist to inquire about patient's mental competence.  Patient is alert and oriented to person and place, however, has some impaired short term memory and has asked me multiple times throughout the day "who are you?" and "what is your name?" when I enter the room to provide care.  She follows commands well, but is periodically confused about details.  Per social work, advanced directive cannot be completed unless patient is completely alert and oriented.  Son was notified about this by Child psychotherapist and became very upset.  Dr. Arbutus Leas was notified and will follow up with patient's son regarding Advanced Directives and updates on patient's medical status.

## 2013-07-07 NOTE — Clinical Documentation Improvement (Signed)
Possible Clinical Conditions?  Encephalopathy                        Anoxic                       Septic                                          Hepatic                       Hypertensive                       Metabolic                       Toxic Other Condition Cannot Clinically Determine   Supporting Information:(As per notes) EEG 07-03-13 Clinical Interpretation:  This EEG is consistent with a focal area of cerebral dysfunction in the left temporal region in the setting of a mild generalized non-specific cerebral dysfunction(encephalopathy). There was no seizure or seizure predisposition recorded on this study. Risk Factors: MS  Thank You, Nevin Bloodgood, RN, BSN, CCDS, Clinical Documentation Specialist:  [phone]   [phone]=[facility]- Health Information Management

## 2013-07-07 NOTE — Discharge Summary (Signed)
Physician Discharge Summary  [name] [name]:[id number] DOB: 1970/02/19 DOA: 07/03/2013  PCP: Jeanann Lewandowsky, MD  Admit date: 07/03/2013 Discharge date: 07/08/13 Recommendations for Outpatient Follow-up:  1. Pt will need to follow up with PCP in 1 weeks post discharge 2. Please obtain BMP in one week 3. Please also check CBC in one week  Discharge Diagnoses:  MS Exacerbation  -accounting for her frequent falls and weakness  -CT head: chronic changes; no s/s of infection; afebrile; no leukocytosis;  -MRI brain shows enhancing white matter lesions c/w active demyelination  -IV solumedrol per neurology--Pt received 5 days of IV solumedrol -appreciate neuro f/u  -EEG neg for seizure predisposition  -social work requested capacity evaluation Nausea, vomiting  -? Gastroenteritis; vs metformin related;  -abd exam benign; no s/s of acute abdomen  -improved since admission--no further vomiting, feeling hungry--tolerating diet  -change IVF to 1/2NS-->Na improved  Hyperbilirubinemia  -?Gilbert's vs cholestasis  -has been chronically mildly elevated  -fractionate bili-->unable as bili has improved  -abd [place] neg for cholecystitis or ductal dilatation  -This improved during the hospitalization and bili normalized Hypokalemia  -likely due to GI loss; IV replace  -improved  DM2, controlled  -last HA1-5.2 on 05/10/13  -continue CBGs due to steroids  -question need for continuation of outpt therapy--will need outpt f/u HTN  -Stable  -continue metoprolol tartrate  -decrease amlodipine dose  -Patient's blood pressure remained stable on amlodipine 5 mg (decreased from 10mg ) -She'll be discharged on these medications L facial trauma;  -CT: -Periapical lucency involving the 3rd left mandibular molar (tooth  16), query loosening versus abscess-->orthopantogram--unable to perform due to inability to stand without assistance  -no obvious drainable abscess on exam  -Needs outpatient  dental followup; presently no pain  Multinodular goiter.  -Dominant 2.4 cm thyroid nodule  -obtain TSH--0.083, free t4--1.65  -thyroid US--confirms multinodular goiter  -may have a degree of subclinical hyperthyroidsm, but recent steroids may have influenced results  -outpt followup   Discharge Condition: Stable  Disposition: Home with home health   Diet:carb modified Wt Readings from Last 3 Encounters:  06/07/13 57.924 kg (127 lb 11.2 oz)  05/10/13 57.788 kg (127 lb 6.4 oz)  04/29/13 52.617 kg (116 lb)    History of present illness:  43 y.o. female with PMH of HTN, DM, MS presented with frequent falls; Pt is not well ambulatory due to MS, but per her son she has been falling a lot, recently has R sided facial injury due to fall; Patient also reports having nausea, vomiting and diarrhea x 1 day, no abd pain, no hematemesis; since admission, the patient's vomiting and diarrhea have resolved. In fact, her diet has been advanced which she has tolerated. Neurology was consulted to see the patient. MRI of the brain was consistent with acute demyelination concerning for multiple sclerosis exacerbation. The patient was started on Solu-Medrol IV. Clinically, the patient improved. PT and OT were consulted. Physical therapy recommended home health with physical therapy. This was up with assistance of case management. Patient's son requested that the gastritis. However the patient was not alert oriented x4. As a result social work requested that he and the patient followup with social services as an outpatient to set this up. They requested capacity evaluation for which psychiatry was consulted.     Consultants: Neurology--Kirkpatrick  Discharge Exam: Filed Vitals:   07/07/13 0513  BP: 132/79  Pulse: 80  Temp: 98.3 F (36.8 C)  Resp: 17   Filed Vitals:   07/06/13  1300 07/06/13 1423 07/06/13 2132 07/07/13 0513  BP:  129/82 138/89 132/79  Pulse:  82 74 80  Temp:  98.2 F (36.8 C) 98.4 F  (36.9 C) 98.3 F (36.8 C)  TempSrc:  Oral Oral Oral  Resp:  18 17 17   Height: 5\' 1"  (1.549 m)     SpO2:  100% 100% 100%   General: Alert and awake NAD, pleasant, cooperative Cardiovascular: RRR, no rub, no gallop, no S3 Respiratory: CTAB, no wheeze, no rhonchi Abdomen:soft, nontender, nondistended, positive bowel sounds Extremities: No edema, No lymphangitis, no petechiae  Discharge Instructions     Medication List         amLODipine 5 MG tablet  Commonly known as:  NORVASC  Take 1 tablet (5 mg total) by mouth daily.     aspirin EC 81 MG tablet  Take 81 mg by mouth daily.     meclizine 25 MG tablet  Commonly known as:  ANTIVERT  Take 1 tablet (25 mg total) by mouth 3 (three) times daily as needed for dizziness or nausea.     metFORMIN 500 MG tablet  Commonly known as:  GLUCOPHAGE  Take 1 tablet (500 mg total) by mouth daily with breakfast.     metoprolol tartrate 25 MG tablet  Commonly known as:  LOPRESSOR  Take 25 mg by mouth 2 (two) times daily.     potassium chloride SA 20 MEQ tablet  Commonly known as:  K-DUR,KLOR-CON  Take 1 tablet (20 mEq total) by mouth daily.         The results of significant diagnostics from this hospitalization (including imaging, microbiology, ancillary and laboratory) are listed below for reference.    Significant Diagnostic Studies: Dg Chest 2 View  07/03/2013   CLINICAL DATA:  Traumatic injury and pain  EXAM: CHEST  2 VIEW  COMPARISON:  06/02/2013  FINDINGS: The heart size and mediastinal contours are within normal limits. Both lungs are clear. The visualized skeletal structures are unremarkable.  IMPRESSION: No active cardiopulmonary disease.   Electronically Signed   By: Alcide Clever M.D.   On: 07/03/2013 13:18   Ct Head Wo Contrast  07/03/2013   CLINICAL DATA:  Current history of multiple sclerosis. Patient fell forward onto the floor, face 1st, sustaining a laceration to the lip. Brief loss of consciousness.  EXAM: CT HEAD  WITHOUT CONTRAST  CT MAXILLOFACIAL WITHOUT CONTRAST  CT CERVICAL SPINE WITHOUT CONTRAST  TECHNIQUE: Multidetector CT imaging of the head, cervical spine, and maxillofacial structures were performed using the standard protocol without intravenous contrast. Multiplanar CT image reconstructions of the cervical spine and maxillofacial structures were also generated.  COMPARISON:  MRI brain 06/04/2013, 09/06/2012. CT head 06/03/2013, 05/28/2012, 03/15/2010.  FINDINGS: CT HEAD FINDINGS  Patient's head is turned to the right. Ventricular system normal in size and appearance for age. Severe changes of small vessel disease of the white matter consistent with given history of MS, unchanged. No mass lesion. No midline shift. No acute hemorrhage or hematoma. No extra-axial fluid collections. No evidence of acute infarction.  No skull fracture or other focal osseous abnormality involving the skull. Bilateral mastoid air cells and middle ear cavities well aerated. Mild bilateral carotid siphon atherosclerosis.  CT MAXILLOFACIAL FINDINGS  Soft tissue swelling/ecchymosis involving the lower left cheek, overlying the left maxilla. No fractures identified involving the facial bones. Temporomandibular joints intact. Periapical lucency involving the 3rd molar in the left side of the mandible (tooth 16).  Bony nasal septum midline. Paranasal sinuses  well aerated. Left concha bullosa.  Both orbits and both globes intact.  CT CERVICAL SPINE FINDINGS  Patient's head is turned to the right, accounting for the rotation at C1-C2. No fractures identified involving the cervical spine. Soft tissue window images demonstrate no frank disc protrusions. Sagittal reconstructed images demonstrate straightening of the usual lordosis with anatomic posterior alignment. Mild disc space narrowing at C4-5 and C5-6. Borderline spinal stenosis at C4-5. Facet joints intact throughout without significant degenerative change. Coronal reformatted images  demonstrate an intact craniocervical junction, intact C1-C2 articulation, intact dens, and intact lateral masses throughout. No significant bony foraminal stenoses.  Visualized lung apices clear. Thyroid gland enlarged with multiple nodules, the largest approximating 2.4 cm.  IMPRESSION: 1. No acute intracranial abnormality. 2. Severe changes of small vessel disease of the white matter consistent with the given diagnosis of MS, not significantly changed since 2012. 3. No facial bone fractures. 4. Periapical lucency involving the 3rd left mandibular molar (tooth 16), query loosening versus abscess. 5. No cervical spine fractures. 6. Mild degenerative disc disease at C4-5 and C5-6 with borderline spinal stenosis at C4-5. 7. Multinodular goiter. Dominant 2.4 cm thyroid nodule. Consider further evaluation with nonemergent thyroid ultrasound. If patient is clinically hyperthyroid, consider nuclear medicine thyroid uptake and scan.   Electronically Signed   By: Hulan Saas M.D.   On: 07/03/2013 13:32   Ct Cervical Spine Wo Contrast  07/03/2013   CLINICAL DATA:  Current history of multiple sclerosis. Patient fell forward onto the floor, face 1st, sustaining a laceration to the lip. Brief loss of consciousness.  EXAM: CT HEAD WITHOUT CONTRAST  CT MAXILLOFACIAL WITHOUT CONTRAST  CT CERVICAL SPINE WITHOUT CONTRAST  TECHNIQUE: Multidetector CT imaging of the head, cervical spine, and maxillofacial structures were performed using the standard protocol without intravenous contrast. Multiplanar CT image reconstructions of the cervical spine and maxillofacial structures were also generated.  COMPARISON:  MRI brain 06/04/2013, 09/06/2012. CT head 06/03/2013, 05/28/2012, 03/15/2010.  FINDINGS: CT HEAD FINDINGS  Patient's head is turned to the right. Ventricular system normal in size and appearance for age. Severe changes of small vessel disease of the white matter consistent with given history of MS, unchanged. No mass  lesion. No midline shift. No acute hemorrhage or hematoma. No extra-axial fluid collections. No evidence of acute infarction.  No skull fracture or other focal osseous abnormality involving the skull. Bilateral mastoid air cells and middle ear cavities well aerated. Mild bilateral carotid siphon atherosclerosis.  CT MAXILLOFACIAL FINDINGS  Soft tissue swelling/ecchymosis involving the lower left cheek, overlying the left maxilla. No fractures identified involving the facial bones. Temporomandibular joints intact. Periapical lucency involving the 3rd molar in the left side of the mandible (tooth 16).  Bony nasal septum midline. Paranasal sinuses well aerated. Left concha bullosa.  Both orbits and both globes intact.  CT CERVICAL SPINE FINDINGS  Patient's head is turned to the right, accounting for the rotation at C1-C2. No fractures identified involving the cervical spine. Soft tissue window images demonstrate no frank disc protrusions. Sagittal reconstructed images demonstrate straightening of the usual lordosis with anatomic posterior alignment. Mild disc space narrowing at C4-5 and C5-6. Borderline spinal stenosis at C4-5. Facet joints intact throughout without significant degenerative change. Coronal reformatted images demonstrate an intact craniocervical junction, intact C1-C2 articulation, intact dens, and intact lateral masses throughout. No significant bony foraminal stenoses.  Visualized lung apices clear. Thyroid gland enlarged with multiple nodules, the largest approximating 2.4 cm.  IMPRESSION: 1. No acute intracranial abnormality. 2.  Severe changes of small vessel disease of the white matter consistent with the given diagnosis of MS, not significantly changed since 2012. 3. No facial bone fractures. 4. Periapical lucency involving the 3rd left mandibular molar (tooth 16), query loosening versus abscess. 5. No cervical spine fractures. 6. Mild degenerative disc disease at C4-5 and C5-6 with borderline  spinal stenosis at C4-5. 7. Multinodular goiter. Dominant 2.4 cm thyroid nodule. Consider further evaluation with nonemergent thyroid ultrasound. If patient is clinically hyperthyroid, consider nuclear medicine thyroid uptake and scan.   Electronically Signed   By: Hulan Saas M.D.   On: 07/03/2013 13:32   Mr Laqueta Jean WU Contrast  07/03/2013   CLINICAL DATA:  MS exacerbation versus CVA  EXAM: MRI HEAD WITHOUT AND WITH CONTRAST  TECHNIQUE: Multiplanar, multiecho pulse sequences of the brain and surrounding structures were obtained without and with intravenous contrast.  CONTRAST:  12mL MULTIHANCE GADOBENATE DIMEGLUMINE 529 MG/ML IV SOLN  COMPARISON:  Prior MRI from 06/04/2013.  FINDINGS: Diffuse cerebral atrophy is unchanged. No mass lesion, midline shift, or extra-axial fluid collection. Ventricles are normal in size without evidence of hydrocephalus.  Multiple T2/FLAIR hyperdensities again seen throughout the cerebral white matter bilaterally, compatible with known history of multiple sclerosis. Multiple cystic areas of demyelinization again noted. Involvement of the central pons and brachium pontis bilaterally, right greater than left again noted as well. On post-contrast sequences, many newly active lesions demonstrate incomplete rings of peripheral enhancement, compatible with active demyelination. For reference purposes, the largest of these lesions within the left cerebral hemisphere is located within the deep/subcortical white matter of the left frontoparietal region and measures 2.0 x 0.9 cm (series 12, image 28). The largest lesion within the right cerebral hemisphere is seen within the right centrum semi ovale and measures approximately 6 mm (series 12, image 35). Multiple additional punctate areas of enhancement seen within the deep white matter of the bilateral frontal lobes (series 12, image 29, 28, 27). There is a 5 mm enhancing lesion within the right corpus callosum (series 12, image 25).  Large enhancing lesion within the right pons measures 1.0 x 1.4 cm (series 12, image 15). Additional enhancing lesion present within the right middle cerebellar peduncle.  Several punctate foci of enhancement seen within the partially visualized upper cervical spinal cord at the level of C2 and C3 (series 14, image 11).  No diffusion-weighted signal abnormality is identified to suggest acute intracranial infarct. Gray-white matter differentiation is maintained. Normal flow voids are seen within the intracranial vasculature. No intracranial hemorrhage identified.  The cervicomedullary junction is normal. Pituitary gland is within normal limits. Pituitary stalk is midline. The globes and optic nerves demonstrate a normal appearance with normal signal intensity.  The bone marrow signal intensity is normal. Calvarium is intact. Visualized upper cervical spine is within normal limits.  Scalp soft tissues are unremarkable.  Paranasal sinuses are clear.  No mastoid effusion.  IMPRESSION: 1. Findings consistent with acute multiple sclerosis exacerbation with multiple enhancing white matter lesions within the supratentorial and infratentorial brain as above, compatible with active demyelination. 2. Punctate foci of enhancement within the upper cervical spinal cord at the level of C2 and C3, also consistent with active demyelination. 3. No evidence of acute intracranial infarct.   Electronically Signed   By: Rise Mu M.D.   On: 07/03/2013 23:14   US Soft Tissue Head/neck  07/03/2013   CLINICAL DATA:  Evaluate thyroid nodules  EXAM: THYROID ULTRASOUND  TECHNIQUE: Ultrasound examination of the thyroid  gland and adjacent soft tissues was performed.  COMPARISON:  None.  FINDINGS: Right thyroid lobe  Measurements: 5.2 x 2.2 by 2.2 cm. There are several solid nodules within the right lobe. The largest is in the upper pole measuring 2.4 x 1.5 x 2.4 cm. This is in the mid portion of the right lobe adjacent to the  isthmus. There are no microcalcifications or hypervascularity associated with this well-circumscribed nodule.  Left thyroid lobe  Measurements: 3.6 x 1.6 x 1.1 cm. Multiple sub cm nodules are identified within the left lobe. The largest is in the inferior pole measuring 1 x 0.5 x 0.9 cm.  Isthmus  Thickness: The isthmus measures 0.8 cm.  Lymphadenopathy  None visualized.  IMPRESSION: 1. Multi nodular thyroid gland. The largest nodule is in the upper pole of the right lobe. Findings meet consensus criteria for biopsy. Ultrasound-guided fine needle aspiration should be considered, as per the consensus statement: Management of Thyroid Nodules Detected at [place]: Society of Radiologists in Ultrasound Consensus Conference Statement. Radiology 2005; [id number]. 2. No adenopathy identified.   Electronically Signed   By: Signa Kell M.D.   On: 07/03/2013 18:42   US Abdomen Complete  07/03/2013   CLINICAL DATA:  Elevated bilirubin  EXAM: ULTRASOUND ABDOMEN COMPLETE  COMPARISON:  Acute abdominal series 05/09/2013.  FINDINGS: Gallbladder:  Shadowing gallstones are present. No sonographic Eulah Pont sign is reported. Wall thickness is within normal limits at 2 mm.  Common bile duct:  Diameter: 3.0 mm, within normal limits.  Liver:  No focal lesion identified. Within normal limits in parenchymal echogenicity.  IVC:  No abnormality visualized.  Pancreas:  Visualized portion unremarkable.  Spleen:  Size and appearance within normal limits.  Right Kidney:  Length: 10.7 cm, within normal limits. Echogenicity within normal limits. No mass or hydronephrosis visualized.  Left Kidney:  Length: 9.6 cm, within normal limits. The left kidney is poorly visualized. No definite mass lesion or hydronephrosis is evident.  Abdominal aorta:  2.1 cm, within normal limits.  Other findings:  1. Cholelithiasis without evidence for cholecystitis. 2. No acute abnormality.   Electronically Signed   By: Gennette Pac M.D.   On: 07/03/2013 18:38   Ct  Maxillofacial Wo Cm  07/03/2013   CLINICAL DATA:  Current history of multiple sclerosis. Patient fell forward onto the floor, face 1st, sustaining a laceration to the lip. Brief loss of consciousness.  EXAM: CT HEAD WITHOUT CONTRAST  CT MAXILLOFACIAL WITHOUT CONTRAST  CT CERVICAL SPINE WITHOUT CONTRAST  TECHNIQUE: Multidetector CT imaging of the head, cervical spine, and maxillofacial structures were performed using the standard protocol without intravenous contrast. Multiplanar CT image reconstructions of the cervical spine and maxillofacial structures were also generated.  COMPARISON:  MRI brain 06/04/2013, 09/06/2012. CT head 06/03/2013, 05/28/2012, 03/15/2010.  FINDINGS: CT HEAD FINDINGS  Patient's head is turned to the right. Ventricular system normal in size and appearance for age. Severe changes of small vessel disease of the white matter consistent with given history of MS, unchanged. No mass lesion. No midline shift. No acute hemorrhage or hematoma. No extra-axial fluid collections. No evidence of acute infarction.  No skull fracture or other focal osseous abnormality involving the skull. Bilateral mastoid air cells and middle ear cavities well aerated. Mild bilateral carotid siphon atherosclerosis.  CT MAXILLOFACIAL FINDINGS  Soft tissue swelling/ecchymosis involving the lower left cheek, overlying the left maxilla. No fractures identified involving the facial bones. Temporomandibular joints intact. Periapical lucency involving the 3rd molar in the left side of  the mandible (tooth 16).  Bony nasal septum midline. Paranasal sinuses well aerated. Left concha bullosa.  Both orbits and both globes intact.  CT CERVICAL SPINE FINDINGS  Patient's head is turned to the right, accounting for the rotation at C1-C2. No fractures identified involving the cervical spine. Soft tissue window images demonstrate no frank disc protrusions. Sagittal reconstructed images demonstrate straightening of the usual lordosis with  anatomic posterior alignment. Mild disc space narrowing at C4-5 and C5-6. Borderline spinal stenosis at C4-5. Facet joints intact throughout without significant degenerative change. Coronal reformatted images demonstrate an intact craniocervical junction, intact C1-C2 articulation, intact dens, and intact lateral masses throughout. No significant bony foraminal stenoses.  Visualized lung apices clear. Thyroid gland enlarged with multiple nodules, the largest approximating 2.4 cm.  IMPRESSION: 1. No acute intracranial abnormality. 2. Severe changes of small vessel disease of the white matter consistent with the given diagnosis of MS, not significantly changed since 2012. 3. No facial bone fractures. 4. Periapical lucency involving the 3rd left mandibular molar (tooth 16), query loosening versus abscess. 5. No cervical spine fractures. 6. Mild degenerative disc disease at C4-5 and C5-6 with borderline spinal stenosis at C4-5. 7. Multinodular goiter. Dominant 2.4 cm thyroid nodule. Consider further evaluation with nonemergent thyroid ultrasound. If patient is clinically hyperthyroid, consider nuclear medicine thyroid uptake and scan.   Electronically Signed   By: Hulan Saashomas  Lawrence M.D.   On: 07/03/2013 13:32     Microbiology: No results found for this or any previous visit (from the past 240 hour(s)).   Labs: Basic Metabolic Panel:  Recent Labs Lab 07/03/13 1155 07/03/13 1208 07/04/13 0623 07/05/13 0431 07/06/13 0719  NA 146  --  142 145 145  K 2.9*  --  4.0 4.3 3.9  CL 104  --  103 109 110  CO2 24  --  24 22 23   GLUCOSE 129*  --  103* 158* 170*  BUN 8  --  6 5* 8  CREATININE 0.52  --  0.58 0.46* 0.48*  CALCIUM 10.0  --  9.4 9.5 9.5  MG  --  2.0  --   --   --    Liver Function Tests:  Recent Labs Lab 07/03/13 1155 07/04/13 0623 07/05/13 0431 07/06/13 0719  AST 19 24 12 11   ALT 15 12 12 13   ALKPHOS 91 79 73 79  BILITOT 1.8* 2.2* 1.2 0.8  PROT 7.9 7.2 6.8 6.8  ALBUMIN 4.5 3.9 3.5  3.5    Recent Labs Lab 07/03/13 1919  LIPASE 46   No results found for this basename: AMMONIA,  in the last 168 hours CBC:  Recent Labs Lab 07/03/13 1155  WBC 7.9  NEUTROABS 5.8  HGB 14.6  HCT 42.1  MCV 92.9  PLT 281   Cardiac Enzymes:  Recent Labs Lab 07/03/13 1208  TROPONINI <0.30   BNP: No components found with this basename: POCBNP,  CBG:  Recent Labs Lab 07/05/13 2130 07/06/13 0806 07/06/13 1255 07/06/13 1640 07/06/13 2130  GLUCAP 171* 154* 127* 250* 171*    Time coordinating discharge:  Greater than 30 minutes  Signed:  TAT, DAVID, DO [facility] Pager: [phone] 07/07/2013, 5:53 AM

## 2013-07-07 NOTE — Clinical Social Work Note (Signed)
CSW received call from pt's son regarding Advance Directives. CSW discussed with pt's son that [facility] would not be able to provide assistance with Advance Directives at this time as pt is documented as confused and alert/oriented x 2. CSW attempted to provide pt's son with resources to the Department of Social Services (DSS) in regards to pt's son next steps in obtaining POA. Pt's son refused CSW resources. Pt's son verbalized frustration with medical staff, pt's hospitalization, and pt's son new caregiver responsibilities. CSW offered support to pt. CSW notified Charge RN, RN, and MD of pt's son concerns.   CSW will continue to be available for assistance with Advance Directives if pt deemed alert and oriented x4.   Marcelline Deist, MSW, [facility] Licensed Clinical Social Worker [phone] and [phone]

## 2013-07-07 NOTE — Care Management Note (Signed)
  Page 1 of 1   07/07/2013     11:05:05 AM CARE MANAGEMENT NOTE 07/07/2013  Patient:  [name] [name], [name] [name]   Account Number:  [id number]  Date Initiated:  07/07/2013  Documentation initiated by:  Ronny Flurry  Subjective/Objective Assessment:     Action/Plan:   Anticipated DC Date:  07/08/2013   Anticipated DC Plan:  HOME W HOME HEALTH SERVICES         Choice offered to / List presented to:  C-1 Patient        HH arranged  HH-1 RN  HH-2 PT  HH-3 OT  HH-4 NURSE'S AIDE      HH agency  [facility]   Status of service:  Completed, signed off Medicare Important Message given?   (If response is "NO", the following Medicare IM given date fields will be blank) Date Medicare IM given:   Date Additional Medicare IM given:    Discharge Disposition:    Per UR Regulation:    If discussed at Long Length of Stay Meetings, dates discussed:    Comments:

## 2013-07-08 LAB — GLUCOSE, CAPILLARY
GLUCOSE-CAPILLARY: 170 mg/dL — AB (ref 70–99)
GLUCOSE-CAPILLARY: 191 mg/dL — AB (ref 70–99)
GLUCOSE-CAPILLARY: 190 mg/dL — AB (ref 70–99)
Glucose-Capillary: 126 mg/dL — ABNORMAL HIGH (ref 70–99)

## 2013-07-08 MED ORDER — BUPROPION HCL ER (XL) 150 MG PO TB24
150.0000 mg | ORAL_TABLET | Freq: Every day | ORAL | Status: DC
  Administered 2013-07-08 – 2013-07-09 (×2): 150 mg via ORAL
  Filled 2013-07-08 (×2): qty 1

## 2013-07-08 NOTE — Progress Notes (Signed)
Subjective: No complaints.   Objective: Current vital signs: BP 142/80  Pulse 53  Temp(Src) 97.5 F (36.4 C) (Oral)  Resp 18  Ht 5\' 1"  (1.549 m)  Wt 60 kg (132 lb 4.4 oz)  BMI 25.01 kg/m2  SpO2 100%  LMP 08/23/2012 Vital signs in last 24 hours: Temp:  [97.5 F (36.4 C)-98.2 F (36.8 C)] 97.5 F (36.4 C) (06/17 0546) Pulse Rate:  [53-74] 53 (06/17 0546) Resp:  [16-18] 18 (06/17 0546) BP: (136-159)/(74-102) 142/80 mmHg (06/17 0550) SpO2:  [98 %-100 %] 100 % (06/17 0546)  Intake/Output from previous day: 06/16 0701 - 06/17 0700 In: 1445.5 [P.O.:700; I.V.:687.5; IV Piggyback:58] Out: 2350 [Urine:2350] Intake/Output this shift:   Nutritional status: Carb Control  Neurologic Exam: Mental Status: Alert, oriented, thought content appropriate.  Speech dysarthric without evidence of aphasia.  Able to follow 3 step commands without difficulty. Cranial Nerves: WE:XHBZJI fields grossly normal, pupils equal, round, reactive to light and accommodation III,IV, VI: ptosis not present, extra-ocular motions intact bilaterally V,VII: smile symmetric, facial light touch sensation normal bilaterally VIII: hearing normal bilaterally IX,X: gag reflex present XI: bilateral shoulder shrug XII: midline tongue extension without atrophy or fasciculations  Motor: Right : Upper extremity   5/5    Left:     Upper extremity   4/5  Lower extremity   5/5     Lower extremity   5/5 Tone and bulk:normal tone throughout; no atrophy noted Sensory: Pinprick and light touch intact throughout, bilaterally Deep Tendon Reflexes:  Brisk throughout  Plantars: Right: downgoing   Left: downgoing Cerebellar: normal finger-to-nose,  normal heel-to-shin test     Lab Results: Basic Metabolic Panel:  Recent Labs Lab 07/03/13 1155 07/03/13 1208 07/04/13 0623 07/05/13 0431 07/06/13 0719 07/07/13 0520  NA 146  --  142 145 145 141  K 2.9*  --  4.0 4.3 3.9 3.6*  CL 104  --  103 109 110 105  CO2 24  --   24 22 23 23   GLUCOSE 129*  --  103* 158* 170* 160*  BUN 8  --  6 5* 8 13  CREATININE 0.52  --  0.58 0.46* 0.48* 0.52  CALCIUM 10.0  --  9.4 9.5 9.5 9.1  MG  --  2.0  --   --   --   --     Liver Function Tests:  Recent Labs Lab 07/03/13 1155 07/04/13 0623 07/05/13 0431 07/06/13 0719  AST 19 24 12 11   ALT 15 12 12 13   ALKPHOS 91 79 73 79  BILITOT 1.8* 2.2* 1.2 0.8  PROT 7.9 7.2 6.8 6.8  ALBUMIN 4.5 3.9 3.5 3.5    Recent Labs Lab 07/03/13 1919  LIPASE 46   No results found for this basename: AMMONIA,  in the last 168 hours  CBC:  Recent Labs Lab 07/03/13 1155  WBC 7.9  NEUTROABS 5.8  HGB 14.6  HCT 42.1  MCV 92.9  PLT 281    Cardiac Enzymes:  Recent Labs Lab 07/03/13 1208  TROPONINI <0.30    Lipid Panel: No results found for this basename: CHOL, TRIG, HDL, CHOLHDL, VLDL, LDLCALC,  in the last 168 hours  CBG:  Recent Labs Lab 07/07/13 0757 07/07/13 1150 07/07/13 1732 07/07/13 2227 07/08/13 0741  GLUCAP 141* 207* 191* 196* 126*    Microbiology: Results for orders placed during the hospital encounter of 06/02/13  CULTURE, BLOOD (ROUTINE X 2)     Status: None   Collection Time  06/02/13 11:09 PM      Result Value Ref Range Status   Specimen Description BLOOD RIGHT ARM   Final   Special Requests BOTTLES DRAWN AEROBIC AND ANAEROBIC 10CC EACH   Final   Culture  Setup Time     Final   Value: 06/03/2013 05:11     Performed at [facility]   Culture     Final   Value: NO GROWTH 5 DAYS     Performed at [facility]   Report Status 06/09/2013 FINAL   Final  CULTURE, BLOOD (ROUTINE X 2)     Status: None   Collection Time    06/02/13 11:14 PM      Result Value Ref Range Status   Specimen Description BLOOD RIGHT HAND   Final   Special Requests BOTTLES DRAWN AEROBIC ONLY 10CC   Final   Culture  Setup Time     Final   Value: 06/03/2013 05:11     Performed at [facility]   Culture     Final   Value: NO GROWTH 5 DAYS      Performed at [facility]   Report Status 06/09/2013 FINAL   Final  URINE CULTURE     Status: None   Collection Time    06/02/13 11:38 PM      Result Value Ref Range Status   Specimen Description URINE, RANDOM   Final   Special Requests Normal   Final   Culture  Setup Time     Final   Value: 06/03/2013 00:28     Performed at [facility]   Colony Count     Final   Value: NO GROWTH     Performed at [facility]   Culture     Final   Value: NO GROWTH     Performed at [facility]   Report Status 06/04/2013 FINAL   Final  GRAM STAIN     Status: None   Collection Time    06/07/13 10:50 PM      Result Value Ref Range Status   Specimen Description URINE, RANDOM   Final   Special Requests Normal   Final   Gram Stain     Final   Value: CYTOSPIN     WBC PRESENT,BOTH PMN AND MONONUCLEAR     YEAST     SQUAMOUS EPITHELIAL CELLS PRESENT   Report Status 06/08/2013 FINAL   Final    Coagulation Studies: No results found for this basename: LABPROT, INR,  in the last 72 hours  Imaging: No results found.  Medications:  Scheduled: . amLODipine  5 mg Oral Daily  . aspirin EC  81 mg Oral Daily  . enoxaparin (LOVENOX) injection  40 mg Subcutaneous Q24H  . insulin aspart  0-9 Units Subcutaneous TID WC  . methylPREDNISolone (SOLU-MEDROL) injection  1,000 mg Intravenous Daily  . metoprolol tartrate  25 mg Oral BID  . potassium chloride SA  20 mEq Oral Daily    Assessment/Plan:  Impression: 43 yo F with MS flare.   Recommendations:  1) pateitn has received 5/5 solumedrol treatments. She will need follow up with out patient neurology in 2-3 weeks  [facility] [address], [place], [place] [address]--[phone] or   [facility] [address], [place], [place] [phone]   2) PT,OT as out patient   Neurology S/O  Felicie MornDavid Smith PA-C [facility] [phone]  07/08/2013, 9:52 AM

## 2013-07-08 NOTE — Progress Notes (Addendum)
Patient evaluated today. She is somewhat confused and thinks it it May of 2003. She is aware that she has MS and that she is in the hospital. Today was her last dose of IV Solumedrol (5 day course) for and MS flare resulting in weakness in RUE ad frequent fall. Currently has 5/5 strength in RUE.Marland Kitchen See D/C summary done by Dr Tat on 07/07/13. Is stable to be discharged to [facility] or SNF as recommended by PT- will d/w social work- place consult for CIR.  Calvert Cantor, MD

## 2013-07-08 NOTE — Progress Notes (Signed)
Physical Therapy Treatment Patient Details Name: [name] [name] MRN: [id number] DOB: October 11, 1970 Today's Date: [name]    History of Present Illness Pt is [name] admitted 07/03/13 for MS exacerbation and accompanying fall with L facial trauma and sore L arm.     PT Comments    More fearful of falling today than in past sessions.  Definitely could benefit from CIR.   Follow Up Recommendations  CIR     Equipment Recommendations  None recommended by PT    Recommendations for Other Services       Precautions / Restrictions Precautions Precautions: Fall Restrictions Weight Bearing Restrictions: No    Mobility  Bed Mobility Overal bed mobility: Needs Assistance Bed Mobility: Supine to Sit     Supine to sit: Min assist     General bed mobility comments: cues for sequencing and initiation; minimal truncal assist to come to sit with assist of rail  Transfers Overall transfer level: Needs assistance Equipment used: Rolling walker (2 wheeled);None Transfers: Sit to/from [employer] to Stand: Min assist;+2 physical assistance Stand pivot transfers: Mod assist;+2 safety/equipment (times 5)       General transfer comment: cues for hand placement; assist to boost confidence and to help come forward.  Ambulation/Gait Ambulation/Gait assistance: Mod assist;+2 physical assistance Ambulation Distance (Feet):  (marched in place 3 steps before pt wanted to sit.) Assistive device: Rolling walker (2 wheeled)           Stairs            Wheelchair Mobility    Modified Rankin (Stroke Patients Only)       Balance Overall balance assessment: Needs assistance Sitting-balance support: Feet supported;No upper extremity supported;Single extremity supported Sitting balance-Leahy Scale: Fair     Standing balance support: Bilateral upper extremity supported Standing balance-Leahy Scale: Poor                      Cognition  Arousal/Alertness: Awake/alert Behavior During Therapy: Flat affect Overall Cognitive Status: No family/caregiver present to determine baseline cognitive functioning Area of Impairment: Attention;Safety/judgement;Awareness;Problem solving;Memory     Memory: Decreased short-term memory Following Commands: Follows one step commands with increased time     Problem Solving: Slow processing;Decreased initiation;Difficulty sequencing;Requires verbal cues;Requires tactile cues      Exercises      General Comments        Pertinent Vitals/Pain     Home Living                      Prior Function            PT Goals (current goals can now be found in the care plan section) Acute Rehab PT Goals Patient Stated Goal: to go home with my son PT Goal Formulation: With patient Time For Goal Achievement: 07/18/13 Potential to Achieve Goals: Good Progress towards PT goals: Progressing toward goals    Frequency  Min 3X/week    PT Plan Current plan remains appropriate    Co-evaluation             End of Session   Activity Tolerance:  (limited by fear and fatigue) Patient left: in chair;with call bell/phone within reach     Time: 1540-1603 PT Time Calculation (min): 23 min  Charges:  $Therapeutic Activity: 23-37 mins                    G Codes:  [name], [name] [name], 4:24 PM [name]  Hopewell BingKen [name], PT [phone]  (pager)

## 2013-07-08 NOTE — Progress Notes (Signed)
OT Cancellation Note  Patient Details Name: RONALD MODGLIN MRN: [id number] DOB: 12/19/1970   Cancelled Treatment:    Reason Eval/Treat Not Completed: Patient declined, no reason specified. Pt fatigued from PT this afternoon and requested OT follow up tomorrow. OT will follow up as available with pt.   Rae Lips [phone] 07/08/2013, 5:26 PM

## 2013-07-08 NOTE — Consult Note (Signed)
[name] [name]   Reason for [name]:  Capacity evaluation Referring Physician:  Dr. Cheyenne Adas is an 43 y.o. female. 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 Multiple sclerosis   . HYPERTENSION 09/30/2006  . DIABETES MELLITUS, TYPE II 09/30/2006  . TIA (transient ischemic attack)   . TOBACCO USER 09/30/2006    Qualifier: Diagnosis of  By: Amil Amen MD, Benjamine Mola    . Multiple sclerosis, relapsing-remitting 05/29/2012  . DYSLIPIDEMIA 09/30/2006    Qualifier: Diagnosis of  By: Amil Amen MD, Benjamine Mola    . DIABETES MELLITUS, TYPE II 09/30/2006    Qualifier: Diagnosis of  By: Amil Amen MD, Benjamine Mola    . BENIGN POSITIONAL VERTIGO 01/27/2010    Qualifier: Diagnosis of  By: Amil Amen MD, Benjamine Mola    . Stroke   . Neuromuscular disorder     hx of ms  . Depression    AXIS IV:  occupational problems, other psychosocial or environmental problems, problems related to social environment, problems with access to health care services and problems with primary support group AXIS V:  41-50 serious symptoms  Plan:  Patient does meet criteria for capacity to make her own medical decisions and living arrangement.  Recommend Wellbutrin XL 150 mg PO Qam for depression Patient does not meet criteria for psychiatric inpatient admission. Supportive therapy provided about ongoing stressors. Appreciate psychiatric consultation and follow up as clinically required Please contact [phone] if needs further assistance  Subjective:   [name] [name] is a 43 y.o. female patient admitted with frequent falls.  HPI: Patient is seen and chart reviewed. Psychiatric consultation requested for capacity evaluation. Patient was admitted to [facility] medical floor due to generalized, weakness, tired and being depressed about three days. Patient son  who is at bed side also provided information for this evaluation. Reportedly she has been diagnosed with MS since 2007 and has been deteriorated day to day activities about three years. She has no services from neurologist and has a scheduled appointment with [facility] clinic on coming Monday. Patient receiving steroid treatment while in the hospital. Patient and her son reported that she is also suffering with depression since her brother died about few years ago due to medical condition and their complication. Reportedly she was a taken care on several family members. 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 She has difficulty to express some of the words and become tearful and frustrated. She has denied SI/HI and psychosis. Patient has been isolated, less socializing and has mobility issue and using walker and bedside commode at home. She has no previous history of psychiatric treatment and no reported history of substance abuse. She has no family history of mental illness.    Medical history:  [name] [name] is a 44 y.o. female with PMH of HTN, DM, MS presented with frequent falls; Pt is not well ambulatory due to MS, but per her son she has been falling a lot, recently has R sided facial injury due to fall; Patient also reports having nausea, vomiting for 1 day,nmo diarrhea, no abd pain, no hematemesis;   Review of Systems: The patient denies anorexia, fever, weight loss,, vision loss, decreased hearing, hoarseness, chest pain, syncope, dyspnea on exertion, peripheral edema, balance deficits, hemoptysis, abdominal pain, melena, hematochezia, severe indigestion/heartburn, hematuria, incontinence, genital sores, muscle  weakness, suspicious skin lesions, transient blindness, difficulty walking, depression, unusual weight change, abnormal bleeding, enlarged lymph nodes, angioedema, and breast masses.   HPI Elements:   Location:  weakness and depression. Quality:  poor. Severity:  acute  and chronic. Timing:  loss of family members and frequent falls due to moblity issues.  Past Psychiatric History: Past Medical History  Diagnosis Date  . Multiple sclerosis   . HYPERTENSION 09/30/2006  . DIABETES MELLITUS, TYPE II 09/30/2006  . TIA (transient ischemic attack)   . TOBACCO USER 09/30/2006    Qualifier: Diagnosis of  By: Amil Amen MD, Benjamine Mola    . Multiple sclerosis, relapsing-remitting 05/29/2012  . DYSLIPIDEMIA 09/30/2006    Qualifier: Diagnosis of  By: Amil Amen MD, Benjamine Mola    . DIABETES MELLITUS, TYPE II 09/30/2006    Qualifier: Diagnosis of  By: Amil Amen MD, Benjamine Mola    . BENIGN POSITIONAL VERTIGO 01/27/2010    Qualifier: Diagnosis of  By: Amil Amen MD, Benjamine Mola    . Stroke   . Neuromuscular disorder     hx of ms  . Depression     reports that she quit smoking about 2 years ago. She has never used smokeless tobacco. She reports that she does not drink alcohol or use illicit drugs. Family History  Problem Relation Age of Onset  . Hypertension Mother   . Kidney failure Mother   . Hypertension Father   . Gout Father   . Hypertension Sister   . Diabetes Sister   . Hypertension Brother      Living Arrangements: Children;Other relatives   Abuse/Neglect [facility]) Physical Abuse: Denies Verbal Abuse: Denies Sexual Abuse: Denies Allergies:  No Known Allergies  ACT Assessment Complete:  no Objective: Blood pressure 142/80, pulse 53, temperature 97.5 F (36.4 C), temperature source Oral, resp. rate 18, height '5\' 1"'  (1.549 m), weight 60 kg (132 lb 4.4 oz), last menstrual period 08/23/2012, SpO2 100.00%.Body mass index is 25.01 kg/(m^2). Results for orders placed during the hospital encounter of 07/03/13 (from the past 72 hour(s))  GLUCOSE, CAPILLARY     Status: Abnormal   Collection Time    07/05/13 12:14 PM      Result Value Ref Range   Glucose-Capillary 137 (*) 70 - 99 mg/dL  GLUCOSE, CAPILLARY     Status: Abnormal   Collection Time    07/05/13  4:47 PM      Result  Value Ref Range   Glucose-Capillary 234 (*) 70 - 99 mg/dL  GLUCOSE, CAPILLARY     Status: Abnormal   Collection Time    07/05/13  9:30 PM      Result Value Ref Range   Glucose-Capillary 171 (*) 70 - 99 mg/dL   Comment 1 Notify RN     Comment 2 Documented in Chart    HEPATIC FUNCTION PANEL     Status: None   Collection Time    07/06/13  7:19 AM      Result Value Ref Range   Total Protein 6.8  6.0 - 8.3 g/dL   Albumin 3.5  3.5 - 5.2 g/dL   AST 11  0 - 37 U/L   ALT 13  0 - 35 U/L   Alkaline Phosphatase 79  39 - 117 U/L   Total Bilirubin 0.8  0.3 - 1.2 mg/dL   Bilirubin, Direct <0.2  0.0 - 0.3 mg/dL   Indirect Bilirubin NOT CALCULATED  0.3 - 0.9 mg/dL  BASIC METABOLIC PANEL     Status: Abnormal   Collection Time  07/06/13  7:19 AM      Result Value Ref Range   Sodium 145  137 - 147 mEq/L   Potassium 3.9  3.7 - 5.3 mEq/L   Chloride 110  96 - 112 mEq/L   CO2 23  19 - 32 mEq/L   Glucose, Bld 170 (*) 70 - 99 mg/dL   BUN 8  6 - 23 mg/dL   Creatinine, Ser 0.48 (*) 0.50 - 1.10 mg/dL   Calcium 9.5  8.4 - 10.5 mg/dL   GFR calc non Af Amer >90  >90 mL/min   GFR calc Af Amer >90  >90 mL/min   Comment: (NOTE)     The eGFR has been calculated using the CKD EPI equation.     This calculation has not been validated in all clinical situations.     eGFR's persistently <90 mL/min signify possible Chronic Kidney     Disease.  GLUCOSE, CAPILLARY     Status: Abnormal   Collection Time    07/06/13  8:06 AM      Result Value Ref Range   Glucose-Capillary 154 (*) 70 - 99 mg/dL  GLUCOSE, CAPILLARY     Status: Abnormal   Collection Time    07/06/13 12:55 PM      Result Value Ref Range   Glucose-Capillary 127 (*) 70 - 99 mg/dL  GLUCOSE, CAPILLARY     Status: Abnormal   Collection Time    07/06/13  4:40 PM      Result Value Ref Range   Glucose-Capillary 250 (*) 70 - 99 mg/dL  GLUCOSE, CAPILLARY     Status: Abnormal   Collection Time    07/06/13  9:30 PM      Result Value Ref Range    Glucose-Capillary 171 (*) 70 - 99 mg/dL   Comment 1 Notify RN    BASIC METABOLIC PANEL     Status: Abnormal   Collection Time    07/07/13  5:20 AM      Result Value Ref Range   Sodium 141  137 - 147 mEq/L   Potassium 3.6 (*) 3.7 - 5.3 mEq/L   Chloride 105  96 - 112 mEq/L   CO2 23  19 - 32 mEq/L   Glucose, Bld 160 (*) 70 - 99 mg/dL   BUN 13  6 - 23 mg/dL   Creatinine, Ser 0.52  0.50 - 1.10 mg/dL   Calcium 9.1  8.4 - 10.5 mg/dL   GFR calc non Af Amer >90  >90 mL/min   GFR calc Af Amer >90  >90 mL/min   Comment: (NOTE)     The eGFR has been calculated using the CKD EPI equation.     This calculation has not been validated in all clinical situations.     eGFR's persistently <90 mL/min signify possible Chronic Kidney     Disease.  GLUCOSE, CAPILLARY     Status: Abnormal   Collection Time    07/07/13  7:57 AM      Result Value Ref Range   Glucose-Capillary 141 (*) 70 - 99 mg/dL  GLUCOSE, CAPILLARY     Status: Abnormal   Collection Time    07/07/13 11:50 AM      Result Value Ref Range   Glucose-Capillary 207 (*) 70 - 99 mg/dL   Comment 1 Notify RN    GLUCOSE, CAPILLARY     Status: Abnormal   Collection Time    07/07/13  5:32 PM      Result  Value Ref Range   Glucose-Capillary 191 (*) 70 - 99 mg/dL   Comment 1 Notify RN    GLUCOSE, CAPILLARY     Status: Abnormal   Collection Time    07/07/13 10:27 PM      Result Value Ref Range   Glucose-Capillary 196 (*) 70 - 99 mg/dL  GLUCOSE, CAPILLARY     Status: Abnormal   Collection Time    07/08/13  7:41 AM      Result Value Ref Range   Glucose-Capillary 126 (*) 70 - 99 mg/dL   Labs are reviewed and are pertinent for hyperglycemia.  Current Facility-Administered Medications  Medication Dose Route Frequency Provider Last Rate Last Dose  . acetaminophen (TYLENOL) tablet 650 mg  650 mg Oral Q6H PRN Kinnie Feil, MD       Or  . acetaminophen (TYLENOL) suppository 650 mg  650 mg Rectal Q6H PRN Kinnie Feil, MD      .  amLODipine (NORVASC) tablet 5 mg  5 mg Oral Daily Orson Eva, MD   5 mg at 07/07/13 1017  . aspirin EC tablet 81 mg  81 mg Oral Daily Kinnie Feil, MD   81 mg at 07/07/13 1016  . enoxaparin (LOVENOX) injection 40 mg  40 mg Subcutaneous Q24H Kinnie Feil, MD   40 mg at 07/07/13 1834  . insulin aspart (novoLOG) injection 0-9 Units  0-9 Units Subcutaneous TID WC Orson Eva, MD   1 Units at 07/08/13 [phone]  . meclizine (ANTIVERT) tablet 25 mg  25 mg Oral TID PRN Kinnie Feil, MD   25 mg at 07/07/13 1017  . methylPREDNISolone sodium succinate (SOLU-MEDROL) 1,000 mg in sodium chloride 0.9 % 50 mL IVPB  1,000 mg Intravenous Daily Roland Rack, MD   1,000 mg at 07/07/13 1023  . metoprolol tartrate (LOPRESSOR) tablet 25 mg  25 mg Oral BID Kinnie Feil, MD   25 mg at 07/07/13 2138  . ondansetron (ZOFRAN) tablet 4 mg  4 mg Oral Q6H PRN Kinnie Feil, MD       Or  . ondansetron (ZOFRAN) injection 4 mg  4 mg Intravenous Q6H PRN Kinnie Feil, MD      . potassium chloride SA (K-DUR,KLOR-CON) CR tablet 20 mEq  20 mEq Oral Daily Orson Eva, MD   20 mEq at 07/07/13 1017    Psychiatric Specialty Exam: Physical Exam  ROS  Blood pressure 142/80, pulse 53, temperature 97.5 F (36.4 C), temperature source Oral, resp. rate 18, height '5\' 1"'  (1.549 m), weight 60 kg (132 lb 4.4 oz), last menstrual period 08/23/2012, SpO2 100.00%.Body mass index is 25.01 kg/(m^2).  General Appearance: Guarded  Eye Contact::  Fair  Speech:  Clear and Coherent and Slow  Volume:  Decreased  Mood:  Anxious, Depressed, Dysphoric, Hopeless and Worthless  Affect:  Tearful  Thought Process:  Coherent and Goal Directed  Orientation:  Full (Time, Place, and Person)  Thought Content:  WDL  Suicidal Thoughts:  No  Homicidal Thoughts:  No  Memory:  Immediate;   Good  Judgement:  Fair  Insight:  Good  Psychomotor Activity:  Psychomotor Retardation  Concentration:  Fair  Recall:  Cook of Knowledge:Fair   Language: Good  Akathisia:  NA  Handed:  Right  AIMS (if indicated):     Assets:  Communication Skills Desire for Improvement Housing Intimacy Leisure Time Resilience Social Support Talents/Skills Transportation  Sleep:      Musculoskeletal: Strength &  Muscle Tone: within normal limits Gait & Station: normal Patient leans: N/A  Treatment Plan Summary: Daily contact with patient to assess and evaluate symptoms and progress in treatment Medication management  [name] [name],[name] R. 07/08/2013 9:33 AM

## 2013-07-09 ENCOUNTER — Inpatient Hospital Stay (HOSPITAL_COMMUNITY)
Admission: RE | Admit: 2013-07-09 | Discharge: 2013-07-24 | DRG: 945 | Disposition: A | Discharge status: Home Health | Payer: Medicare Other | Source: Intra-hospital | Attending: Physical Medicine & Rehabilitation | Admitting: Physical Medicine & Rehabilitation

## 2013-07-09 DIAGNOSIS — Z841 Family history of disorders of kidney and ureter: Secondary | ICD-10-CM

## 2013-07-09 DIAGNOSIS — E785 Hyperlipidemia, unspecified: Secondary | ICD-10-CM | POA: Diagnosis present

## 2013-07-09 DIAGNOSIS — Z7982 Long term (current) use of aspirin: Secondary | ICD-10-CM

## 2013-07-09 DIAGNOSIS — R221 Localized swelling, mass and lump, neck: Secondary | ICD-10-CM

## 2013-07-09 DIAGNOSIS — K5901 Slow transit constipation: Secondary | ICD-10-CM

## 2013-07-09 DIAGNOSIS — Z833 Family history of diabetes mellitus: Secondary | ICD-10-CM

## 2013-07-09 DIAGNOSIS — K5900 Constipation, unspecified: Secondary | ICD-10-CM | POA: Diagnosis present

## 2013-07-09 DIAGNOSIS — Z8349 Family history of other endocrine, nutritional and metabolic diseases: Secondary | ICD-10-CM

## 2013-07-09 DIAGNOSIS — Z5189 Encounter for other specified aftercare: Principal | ICD-10-CM

## 2013-07-09 DIAGNOSIS — F0630 Mood disorder due to known physiological condition, unspecified: Secondary | ICD-10-CM

## 2013-07-09 DIAGNOSIS — R220 Localized swelling, mass and lump, head: Secondary | ICD-10-CM

## 2013-07-09 DIAGNOSIS — F3289 Other specified depressive episodes: Secondary | ICD-10-CM | POA: Diagnosis present

## 2013-07-09 DIAGNOSIS — G35 Multiple sclerosis: Secondary | ICD-10-CM | POA: Diagnosis present

## 2013-07-09 DIAGNOSIS — F329 Major depressive disorder, single episode, unspecified: Secondary | ICD-10-CM | POA: Diagnosis present

## 2013-07-09 DIAGNOSIS — R471 Dysarthria and anarthria: Secondary | ICD-10-CM | POA: Diagnosis present

## 2013-07-09 DIAGNOSIS — Z8249 Family history of ischemic heart disease and other diseases of the circulatory system: Secondary | ICD-10-CM

## 2013-07-09 DIAGNOSIS — Z8673 Personal history of transient ischemic attack (TIA), and cerebral infarction without residual deficits: Secondary | ICD-10-CM

## 2013-07-09 DIAGNOSIS — Z79899 Other long term (current) drug therapy: Secondary | ICD-10-CM

## 2013-07-09 DIAGNOSIS — I10 Essential (primary) hypertension: Secondary | ICD-10-CM | POA: Diagnosis present

## 2013-07-09 DIAGNOSIS — Z87891 Personal history of nicotine dependence: Secondary | ICD-10-CM

## 2013-07-09 DIAGNOSIS — E1149 Type 2 diabetes mellitus with other diabetic neurological complication: Secondary | ICD-10-CM | POA: Diagnosis present

## 2013-07-09 DIAGNOSIS — E119 Type 2 diabetes mellitus without complications: Secondary | ICD-10-CM

## 2013-07-09 DIAGNOSIS — E1142 Type 2 diabetes mellitus with diabetic polyneuropathy: Secondary | ICD-10-CM | POA: Diagnosis present

## 2013-07-09 DIAGNOSIS — G8250 Quadriplegia, unspecified: Secondary | ICD-10-CM | POA: Diagnosis present

## 2013-07-09 DIAGNOSIS — E876 Hypokalemia: Secondary | ICD-10-CM

## 2013-07-09 DIAGNOSIS — F32A Depression, unspecified: Secondary | ICD-10-CM

## 2013-07-09 DIAGNOSIS — Z9181 History of falling: Secondary | ICD-10-CM

## 2013-07-09 LAB — GLUCOSE, CAPILLARY
GLUCOSE-CAPILLARY: 151 mg/dL — AB (ref 70–99)
Glucose-Capillary: 127 mg/dL — ABNORMAL HIGH (ref 70–99)
Glucose-Capillary: 122 mg/dL — ABNORMAL HIGH (ref 70–99)
Glucose-Capillary: 102 mg/dL — ABNORMAL HIGH (ref 70–99)

## 2013-07-09 LAB — CBC
HCT: 37 % (ref 36.0–46.0)
Hemoglobin: 13.1 g/dL (ref 12.0–15.0)
MCH: 32 pg (ref 26.0–34.0)
MCHC: 35.4 g/dL (ref 30.0–36.0)
MCV: 90.5 fL (ref 78.0–100.0)
PLATELETS: 307 10*3/uL (ref 150–400)
RBC: 4.09 MIL/uL (ref 3.87–5.11)
RDW: 12.1 % (ref 11.5–15.5)
WBC: 11.6 10*3/uL — ABNORMAL HIGH (ref 4.0–10.5)

## 2013-07-09 LAB — CREATININE, SERUM
CREATININE: 0.53 mg/dL (ref 0.50–1.10)
GFR calc Af Amer: >90 mL/min (ref >90)

## 2013-07-09 MED ORDER — METOPROLOL TARTRATE 25 MG PO TABS
25.0000 mg | ORAL_TABLET | Freq: Two times a day (BID) | ORAL | Status: DC
  Administered 2013-07-09 – 2013-07-24 (×30): 25 mg via ORAL
  Filled 2013-07-09 (×32): qty 1

## 2013-07-09 MED ORDER — INSULIN ASPART 100 UNIT/ML ~~LOC~~ SOLN
0.0000 [IU] | Freq: Three times a day (TID) | SUBCUTANEOUS | Status: DC
  Administered 2013-07-09: 2 [IU] via SUBCUTANEOUS
  Administered 2013-07-10 – 2013-07-14 (×3): 1 [IU] via SUBCUTANEOUS

## 2013-07-09 MED ORDER — MECLIZINE HCL 25 MG PO TABS
25.0000 mg | ORAL_TABLET | Freq: Three times a day (TID) | ORAL | Status: DC | PRN
  Administered 2013-07-23: 25 mg via ORAL
  Filled 2013-07-09: qty 1

## 2013-07-09 MED ORDER — SORBITOL 70 % SOLN
30.0000 mL | Freq: Every day | Status: DC | PRN
Start: 2013-07-09 — End: 2013-07-24
  Administered 2013-07-15: 30 mL via ORAL
  Filled 2013-07-09: qty 30

## 2013-07-09 MED ORDER — BUPROPION HCL ER (XL) 150 MG PO TB24
150.0000 mg | ORAL_TABLET | Freq: Every day | ORAL | Status: DC
  Administered 2013-07-10 – 2013-07-24 (×15): 150 mg via ORAL
  Filled 2013-07-09 (×16): qty 1

## 2013-07-09 MED ORDER — AMLODIPINE BESYLATE 5 MG PO TABS
5.0000 mg | ORAL_TABLET | Freq: Every day | ORAL | Status: DC
  Administered 2013-07-10 – 2013-07-24 (×15): 5 mg via ORAL
  Filled 2013-07-09 (×16): qty 1

## 2013-07-09 MED ORDER — ENOXAPARIN SODIUM 40 MG/0.4ML ~~LOC~~ SOLN
40.0000 mg | SUBCUTANEOUS | Status: DC
Start: 2013-07-09 — End: 2013-07-24
  Administered 2013-07-09 – 2013-07-23 (×15): 40 mg via SUBCUTANEOUS
  Filled 2013-07-09 (×16): qty 0.4

## 2013-07-09 MED ORDER — POTASSIUM CHLORIDE CRYS ER 20 MEQ PO TBCR
20.0000 meq | EXTENDED_RELEASE_TABLET | Freq: Every day | ORAL | Status: DC
  Administered 2013-07-10 – 2013-07-24 (×15): 20 meq via ORAL
  Filled 2013-07-09 (×16): qty 1

## 2013-07-09 MED ORDER — ASPIRIN EC 81 MG PO TBEC
81.0000 mg | DELAYED_RELEASE_TABLET | Freq: Every day | ORAL | Status: DC
  Administered 2013-07-10 – 2013-07-24 (×15): 81 mg via ORAL
  Filled 2013-07-09 (×16): qty 1

## 2013-07-09 MED ORDER — ENOXAPARIN SODIUM 40 MG/0.4ML ~~LOC~~ SOLN: 40.0000 mg | SUBCUTANEOUS | Status: DC

## 2013-07-09 MED ORDER — ONDANSETRON HCL 4 MG/2ML IJ SOLN: 4.0000 mg | Freq: Four times a day (QID) | INTRAMUSCULAR | Status: DC | PRN

## 2013-07-09 MED ORDER — ONDANSETRON HCL 4 MG PO TABS: 4.0000 mg | ORAL_TABLET | Freq: Four times a day (QID) | ORAL | Status: DC | PRN

## 2013-07-09 NOTE — H&P (Signed)
Physical Medicine and Rehabilitation Admission H&P      Chief Complaint   Patient presents with   .  Fall    : HPI: [name] [name] is a 43 y.o. right-handed female with history of hypertension, diabetes mellitus with peripheral neuropathy as well as multiple sclerosis diagnosed 2007. Patient lives with her son and used a Youth worker prior to admission. Presented 07/03/2013 with increasing weakness over the last 3-4 days as well as mild nausea with vomiting. There was report of a witnessed fall by her son striking her face without loss of consciousness. Cranial CT scan with no acute intracranial abnormalities. Severe changes of small vessel disease of the white matter consistent with multiple sclerosis. Placed on intravenous Solu-Medrol x5 days for suspect MS exacerbation completed 07/08/2013. Psychiatry was consulted in relation to depression placed on Wellbutrin. Subcutaneous Lovenox for DVT prophylaxis. Maintained on a regular consistency diet. Physical therapy evaluation completed an ongoing with recommendations of physical medicine rehabilitation consult. Patient was admitted for comprehensive rehabilitation program  Patient states she required assistance of her son for dressing and her daughter in law for bathing prior to admission. States she used a walker to ambulate prior to admission   ROS Review of Systems   Gastrointestinal: Positive for constipation.   Musculoskeletal: Positive for falls and myalgias.   Neurological: Positive for dizziness and weakness.   Psychiatric/Behavioral: Positive for depression.   All other systems reviewed and are negative    Past Medical History   Diagnosis  Date   .  Multiple sclerosis     .  HYPERTENSION  09/30/2006   .  DIABETES MELLITUS, TYPE II  09/30/2006   .  TIA (transient ischemic attack)     .  TOBACCO USER  09/30/2006       Qualifier: Diagnosis of  By: Delrae Alfred MD, Lanora Manis     .  Multiple sclerosis, relapsing-remitting  05/29/2012   .   DYSLIPIDEMIA  09/30/2006       Qualifier: Diagnosis of  By: Delrae Alfred MD, Lanora Manis     .  DIABETES MELLITUS, TYPE II  09/30/2006       Qualifier: Diagnosis of  By: Delrae Alfred MD, Lanora Manis     .  BENIGN POSITIONAL VERTIGO  01/27/2010       Qualifier: Diagnosis of  By: Delrae Alfred MD, Lanora Manis     .  Stroke     .  Neuromuscular disorder         hx of ms   .  Depression      History reviewed. No pertinent past surgical history. Family History   Problem  Relation  Age of Onset   .  Hypertension  Mother     .  Kidney failure  Mother     .  Hypertension  Father     .  Gout  Father     .  Hypertension  Sister     .  Diabetes  Sister     .  Hypertension  Brother      Social History: reports that she quit smoking about 2 years ago. She has never used smokeless tobacco. She reports that she does not drink alcohol or use illicit drugs. Allergies: No Known Allergies Medications Prior to Admission   Medication  Sig  Dispense  Refill   .  amLODipine (NORVASC) 10 MG tablet  Take 1 tablet (10 mg total) by mouth daily.   30 tablet   0   .  aspirin EC 81  MG tablet  Take 81 mg by mouth daily.         .  meclizine (ANTIVERT) 25 MG tablet  Take 1 tablet (25 mg total) by mouth 3 (three) times daily as needed for dizziness or nausea.   90 tablet   11   .  metFORMIN (GLUCOPHAGE) 500 MG tablet  Take 1 tablet (500 mg total) by mouth daily with breakfast.   60 tablet   0   .  metoprolol tartrate (LOPRESSOR) 25 MG tablet  Take 25 mg by mouth 2 (two) times daily.         .  potassium chloride SA (K-DUR,KLOR-CON) 20 MEQ tablet  Take 1 tablet (20 mEq total) by mouth daily.   30 tablet   0      Home: Home Living Family/patient expects to be discharged to:: Private residence Living Arrangements: Children;Other relatives Available Help at Discharge: Family;Available 24 hours/day Type of Home: Apartment Home Access: Level entry Home Layout: One level Home Equipment: Walker - 2 wheels;Cane - single point Additional  Comments: Pt poor historian, appears confused. No family present. Attempted to contact son Olga Millers("[place]ent[place]") without success.     Functional History: Prior Function Level of Independence: Needs assistance Gait / Transfers Assistance Needed: per pt, uses RW at home to get around. Pt states "I want a wheelchair so I can go further." ADL's / Homemaking Assistance Needed: per pt report, son and his girlfriend assist with bathing and dressing (LB) and IADLs.   Communication / Swallowing Assistance Needed: Pt with noted slurred speech, slow to respond to questions. ADmitted w/ AMS & no family present to verify. Comments: pt reports falling several times when asked and states that her son helps her up. Unable to confirm with family.    Functional Status:   Mobility: Bed Mobility Overal bed mobility: Needs Assistance Bed Mobility: Supine to Sit Rolling: Min assist Sidelying to sit: Min assist Supine to sit: Min assist Sit to supine: Min guard General bed mobility comments: cues for sequencing and initiation; minimal truncal assist to come to sit with assist of rail Transfers Overall transfer level: Needs assistance Equipment used: Rolling walker (2 wheeled);None Transfers: Sit to/from [employer];Stand Pivot Transfers Sit to Stand: Min assist;+2 physical assistance Stand pivot transfers: Mod assist;+2 safety/equipment (times 5) General transfer comment: cues for hand placement; assist to boost confidence and to help come forward. Ambulation/Gait Ambulation/Gait assistance: Mod assist;+2 physical assistance Ambulation Distance (Feet):  (marched in place 3 steps before pt wanted to sit.) Assistive device: Rolling walker (2 wheeled) Gait Pattern/deviations: Step-to pattern;Decreased step length - right;Decreased step length - left;Narrow base of support;Shuffle General Gait Details: Short steps with max encouragement required to attempt ambulation   ADL: ADL Overall ADL's : Needs  assistance/impaired Eating/Feeding: Set up;Sitting Eating/Feeding Details (indicate cue type and reason): Pt able to feed self without spills Grooming: Set up;Sitting Upper Body Bathing: Sitting;Moderate assistance;Cueing for sequencing Lower Body Bathing: Moderate assistance;Sit to/from stand;Cueing for sequencing Upper Body Dressing : Moderate assistance;Sitting Lower Body Dressing: Maximal assistance;Sit to/from stand;Cueing for sequencing Toilet Transfer: Minimal assistance;Stand-pivot;RW;BSC Toilet Transfer Details (indicate cue type and reason): Requires min a for [facility] from [facility] to recliner.    Toileting- Clothing Manipulation and Hygiene: Total assistance;Sit to/from stand Toileting - Clothing Manipulation Details (indicate cue type and reason): pt propped RLE on bed and leaned back on [facility] for toilet hygiene.VC's for safety.   Tub/ Shower Transfer: Total assistance Functional mobility during ADLs:  (did not assess this date)  General ADL Comments: Pt reluctant to work with therapy, however after bedside evaluation pt requested to use bathroom. Pt asked to use bedpan, however encouraged pt to exercise Bil LEs through standing and transferring to [facility]. Pt performed transfer with Min A and bearing weight through both legs to stand fully upright. Pt demonstrates slow processing and difficult to determine cognitive status with unknown baseline.    Cognition: Cognition Overall Cognitive Status: No family/caregiver present to determine baseline cognitive functioning Orientation Level: Oriented to person;Oriented to place;Disoriented to time;Disoriented to situation Cognition Arousal/Alertness: Awake/alert Behavior During Therapy: Flat affect Overall Cognitive Status: No family/caregiver present to determine baseline cognitive functioning Area of Impairment: Attention;Safety/judgement;Awareness;Problem solving;Memory Orientation Level: Disoriented to;Place;Time (pt states she is here because  "I fell") Current Attention Level: Sustained Memory: Decreased short-term memory Following Commands: Follows one step commands with increased time Safety/Judgement: Decreased awareness of safety;Decreased awareness of deficits Awareness: Intellectual Problem Solving: Slow processing;Decreased initiation;Difficulty sequencing;Requires verbal cues;Requires tactile cues General Comments: Difficult to determine cognitive status due to unknown baseline. Attempted to call pt's son with no success.    Physical Exam: Blood pressure 145/87, pulse 72, temperature 97.7 F (36.5 C), temperature source Oral, resp. rate 17, height 5\' 1"  (1.549 m), weight 60 kg (132 lb 4.4 oz), last menstrual period 08/23/2012, SpO2 100.00%. Physical Exam Constitutional:  43 year old [place] American female appearing older than stated age.   HENT:   Head: Normocephalic.   Eyes: EOM are normal.   Neck: Normal range of motion. Neck supple. No thyromegaly present.   Cardiovascular: Normal rate and regular rhythm.   Respiratory: Effort normal and breath sounds normal. No respiratory distress.   GI: Soft. Bowel sounds are normal. She exhibits no distension.  Neurological: She is alert.  Mood is flat. She is a poor historian. She is able to provide as name, date of birth in place. Oriented to [place] but not to rehabilitation unit. Not oriented to months day or date. She follows one step but has difficulty with two-step commands. Vision impaired. Very delayed processing. Moves all 4's but inconsistently.  Skin: Skin is warm and dry.  Psychiatric:  Flat, tearful   Motor strength is 4 minus at the deltoid, bicep, tricep, grip 3 minus hip flexors, knee extensors, ankle dorsiflexor plantar flexor toe flexor and extensor Sensation difficult to assess secondary to her mental status reduced light touch sensation bilateral hands as well as left foot relatively intact on the right foot Results for orders placed during the  hospital encounter of 07/03/13 (from the past 48 hour(s))   GLUCOSE, CAPILLARY     Status: Abnormal     Collection Time      07/07/13 11:50 AM       Result  Value  Ref Range     Glucose-Capillary  207 (*)  70 - 99 mg/dL     Comment 1  Notify RN      GLUCOSE, CAPILLARY     Status: Abnormal     Collection Time      07/07/13  5:32 PM       Result  Value  Ref Range     Glucose-Capillary  191 (*)  70 - 99 mg/dL     Comment 1  Notify RN      GLUCOSE, CAPILLARY     Status: Abnormal     Collection Time      07/07/13 10:27 PM       Result  Value  Ref Range  Glucose-Capillary  196 (*)  70 - 99 mg/dL   GLUCOSE, CAPILLARY     Status: Abnormal     Collection Time      07/08/13  7:41 AM       Result  Value  Ref Range     Glucose-Capillary  126 (*)  70 - 99 mg/dL   GLUCOSE, CAPILLARY     Status: Abnormal     Collection Time      07/08/13 12:14 PM       Result  Value  Ref Range     Glucose-Capillary  170 (*)  70 - 99 mg/dL     Comment 1  Notify RN      GLUCOSE, CAPILLARY     Status: Abnormal     Collection Time      07/08/13  6:01 PM       Result  Value  Ref Range     Glucose-Capillary  191 (*)  70 - 99 mg/dL     Comment 1  Notify RN      GLUCOSE, CAPILLARY     Status: Abnormal     Collection Time      07/08/13  9:34 PM       Result  Value  Ref Range     Glucose-Capillary  190 (*)  70 - 99 mg/dL   GLUCOSE, CAPILLARY     Status: Abnormal     Collection Time      07/09/13  8:27 AM       Result  Value  Ref Range     Glucose-Capillary  127 (*)  70 - 99 mg/dL    No results found.         Medical Problem List and Plan: 1. Functional deficits secondary to MS exacerbation.Solumedrol 5 day course completed 2.  DVT Prophylaxis/Anticoagulation: Subcutaneous Lovenox.Monitor platelet count any signs of bleeding 3. Pain Management: tylenol as needed 4. Mood: Wellbutrin 150 mg daily. Followup per psychiatry services.provide emotional support 5. Neuropsych: This patient is not capable  of making decisions on her own behalf. 6.Hypertension.Norvasc 5 mg daily,Lopressor 25 mg twice daily.Monitor with increased mobility 7.Diabetes Mellitus with peripheral neuropathy. Latest hemoglobin A1c 5.2. Check blood sugars a.c. and at bedtime. No current diabetic agents. Patient on Glucophage 500 mg daily prior to admission. Will resume as needed    Post Admission Physician Evaluation: Functional deficits secondary  to exacerbation of multiple sclerosis with quadriparesis and severe cognitive deficits. Patient is admitted to receive collaborative, interdisciplinary care between the physiatrist, rehab nursing staff, and therapy team. Patient's level of medical complexity and substantial therapy needs in context of that medical necessity cannot be provided at a lesser intensity of care such as a SNF. Patient has experienced substantial functional loss from his/her baseline which was documented above under the "Functional History" and "Functional Status" headings.  Judging by the patient's diagnosis, physical exam, and functional history, the patient has potential for functional progress which will result in measurable gains while on inpatient rehab.  These gains will be of substantial and practical use upon discharge  in facilitating mobility and self-care at the household level. Physiatrist will provide 24 hour management of medical needs as well as oversight of the therapy plan/treatment and provide guidance as appropriate regarding the interaction of the two. 24 hour rehab nursing will assist with bladder management, bowel management, safety, skin/wound care, disease management, medication administration, pain management and patient education  and help integrate therapy concepts, techniques,education, etc. PT will  assess and treat for/with: pre gait, gait training, endurance , safety, equipment, neuromuscular re education.   Goals are: min A Mobility Walker level HH distance. OT will assess and  treat for/with: ADLs, Cognitive perceptual skills, Neuromuscular re education, safety, endurance, equipment.   Goals are: Min A ADL. SLP will assess and treat for/with: swallowing, dysarthria, cognition.  Goals are: safe po intake, 90 % speech intell, direct care. Case Management and Social Worker will assess and treat for psychological issues and discharge planning. Team conference will be held weekly to assess progress toward goals and to determine barriers to discharge. Patient will receive at least 3 hours of therapy per day at least 5 days per week. ELOS: 10-14 days        Prognosis:  good  Erick Colace M.D. [facility] FAAPM&R (Sports Med, Neuromuscular Med) Diplomate Am Board of Electrodiagnostic Med  07/09/2013

## 2013-07-09 NOTE — PMR Pre-admission (Signed)
PMR Admission Coordinator Pre-Admission Assessment  Patient: [name] [name] is an 43 y.o., female MRN: [id number] DOB: 1970/11/29 Height: 5\' 1"  (154.9 cm) Weight: 60 kg (132 lb 4.4 oz)              Insurance Information HMO:  No    PPO:       PCP:       IPA:       80/20:       OTHER:   PRIMARY: Medicare A/B      Policy#: [id number]      Subscriber: Felix Thier CM Name:        Phone#:       Fax#:   Pre-Cert#:        Employer: Not employed, disabled Benefits:  Phone #:       Name: Checked in [place]. Date: 11/22/12     Deduct: $1260      Out of Pocket Max: none      Life Max: unlimited CIR: 100%      SNF: 100 days Outpatient: 80%     Co-Pay: 20% Home Health: 100%      Co-Pay: none DME: 80%     Co-Pay: 20% Providers: patient's choice  SECONDARY: Medicaid [place] access      Policy#: [id number]      Subscriber: [name] CM Name:        Phone#:       Fax#:   Pre-Cert#:        Employer: Not employed, disabled Benefits:  Phone #: [phone]     Name: Automated Eff. Date: Eligible 07/09/13     Deduct:        Out of Pocket Max:        Life Max:   CIR:        SNF:   Outpatient:       Co-Pay:   Home Health:        Co-Pay:   DME:       Co-Pay:    Emergency Contact Information Contact Information   Name Relation Home Work Mobile   [place] Son [phone]     [name] [phone]       Current Medical History  Patient Admitting Diagnosis:  MS exacerbation  History of Present Illness: A 43 y.o. right-handed female with history of hypertension, diabetes mellitus with peripheral neuropathy as well as multiple sclerosis diagnosed 2007. Patient lives with her son and used a Youth worker prior to admission. Presented 07/03/2013 with increasing weakness over the last 3-4 days as well as mild nausea with vomiting. There was report of a witnessed fall by her son striking her face without loss of consciousness. Cranial CT scan with no acute intracranial  abnormalities. Severe changes of small vessel disease of the white matter consistent with multiple sclerosis. Placed on intravenous Solu-Medrol x5 days for suspect MS exacerbation completed 07/08/2013. Psychiatry was consulted in relation to depression placed on Wellbutrin. Subcutaneous Lovenox for DVT prophylaxis. Maintained on a regular consistency diet. Physical therapy evaluation completed an ongoing with recommendations of physical medicine rehabilitation consult.   Past Medical History  Past Medical History  Diagnosis Date  . Multiple sclerosis   . HYPERTENSION 09/30/2006  . DIABETES MELLITUS, TYPE II 09/30/2006  . TIA (transient ischemic attack)   . TOBACCO USER 09/30/2006    Qualifier: Diagnosis of  By: Delrae Alfred MD, Lanora Manis    . Multiple sclerosis, relapsing-remitting 05/29/2012  . DYSLIPIDEMIA 09/30/2006    Qualifier: Diagnosis of  ByDelrae Alfred MD, Lanora Manis    . DIABETES MELLITUS, TYPE II 09/30/2006    Qualifier: Diagnosis of  By: Delrae Alfred MD, Lanora Manis    . BENIGN POSITIONAL VERTIGO 01/27/2010    Qualifier: Diagnosis of  By: Delrae Alfred MD, Lanora Manis    . Stroke   . Neuromuscular disorder     hx of ms  . Depression     Family History  family history includes Diabetes in her sister; Gout in her father; Hypertension in her brother, father, mother, and sister; Kidney failure in her mother.  Prior Rehab/Hospitalizations:  Recently had HH from [facility] an aide for bath and BP checks.   Current Medications  Current facility-administered medications:acetaminophen (TYLENOL) suppository 650 mg, 650 mg, Rectal, Q6H PRN, Esperanza Sheets, MD;  acetaminophen (TYLENOL) tablet 650 mg, 650 mg, Oral, Q6H PRN, Esperanza Sheets, MD;  amLODipine (NORVASC) tablet 5 mg, 5 mg, Oral, Daily, Catarina Hartshorn, MD, 5 mg at 07/09/13 1022;  aspirin EC tablet 81 mg, 81 mg, Oral, Daily, Esperanza Sheets, MD, 81 mg at 07/09/13 1022 buPROPion (WELLBUTRIN XL) 24 hr tablet 150 mg, 150 mg, Oral, Daily, Nehemiah Settle, MD, 150 mg at 07/09/13 1022;  enoxaparin (LOVENOX) injection 40 mg, 40 mg, Subcutaneous, Q24H, Esperanza Sheets, MD, 40 mg at 07/08/13 1857;  insulin aspart (novoLOG) injection 0-9 Units, 0-9 Units, Subcutaneous, TID WC, Catarina Hartshorn, MD, 1 Units at 07/09/13 1320 meclizine (ANTIVERT) tablet 25 mg, 25 mg, Oral, TID PRN, Esperanza Sheets, MD, 25 mg at 07/07/13 1017;  metoprolol tartrate (LOPRESSOR) tablet 25 mg, 25 mg, Oral, BID, Esperanza Sheets, MD, 25 mg at 07/09/13 1022;  ondansetron (ZOFRAN) injection 4 mg, 4 mg, Intravenous, Q6H PRN, Esperanza Sheets, MD;  ondansetron (ZOFRAN) tablet 4 mg, 4 mg, Oral, Q6H PRN, Esperanza Sheets, MD potassium chloride SA (K-DUR,KLOR-CON) CR tablet 20 mEq, 20 mEq, Oral, Daily, Catarina Hartshorn, MD, 20 mEq at 07/09/13 1022  Patients Current Diet: Carb Control  Precautions / Restrictions Precautions Precautions: Fall Restrictions Weight Bearing Restrictions: No   Prior Activity Level Household: pt had been staying at home. Just recently moved to a new apartment and basically was homebound. When she was feeling stronger, she would go on errands with her son. Son does the driving. Son reports that pt became more depressed after losing her job 3 years ago and after caring for several sick family members (two brothers passed away).  Home Assistive Devices / Equipment Home Assistive Devices/Equipment: Dan Humphreys (specify type);Bedside commode/3-in-1 Home Equipment: Walker - 2 wheels;Cane - single point  Prior Functional Level Prior Function Level of Independence: Needs assistance Gait / Transfers Assistance Needed: per pt, uses RW at home to get around. Pt states "I want a wheelchair so I can go further." ADL's / Homemaking Assistance Needed: per pt report, son and his girlfriend assist with bathing and dressing (LB) and IADLs.  Communication / Swallowing Assistance Needed: Pt with noted slurred speech, slow to respond to questions. ADmitted w/ AMS & no family  present to verify. Comments: pt reports falling several times when asked and states that her son helps her up. Unable to confirm with family.   Current Functional Level Cognition  Overall Cognitive Status: No family/caregiver present to determine baseline cognitive functioning Current Attention Level: Sustained Orientation Level: Oriented to person;Oriented to place;Oriented to situation;Disoriented to time Following Commands: Follows one step commands with increased time Safety/Judgement: Decreased awareness of safety;Decreased awareness of deficits General Comments: Difficult to determine cognitive status  due to unknown baseline. Attempted to call pt's son with no success.     Extremity Assessment (includes Sensation/Coordination)   Upper Extremity Assessment: Defer to OT evaluation (L worse than R)  Lower Extremity Assessment: Generalized weakness;RLE deficits/detail;LLE deficits/detail (significant weakness and ataxia bilaterally)  RLE Deficits / Details: gross extension 3+ at best  LLE Deficits / Details: L appears weaker than R by MMT and function Very weak pf/dr   ADLs  Overall ADL's : Needs assistance/impaired Eating/Feeding: Set up;Sitting Eating/Feeding Details (indicate cue type and reason): Pt able to feed self without spills Grooming: Set up;Sitting Upper Body Bathing: Sitting;Moderate assistance;Cueing for sequencing Lower Body Bathing: Moderate assistance;Sit to/from stand;Cueing for sequencing Upper Body Dressing : Moderate assistance;Sitting Lower Body Dressing: Maximal assistance;Sit to/from stand;Cueing for sequencing Toilet Transfer: Minimal assistance;Stand-pivot;RW;BSC Toilet Transfer Details (indicate cue type and reason): Requires min a for balance from [facility] to recliner.   Toileting- Clothing Manipulation and Hygiene: Total assistance;Sit to/from stand Toileting - Clothing Manipulation Details (indicate cue type and reason): pt propped RLE on bed and leaned back  on [facility] for toilet hygiene.VC's for safety.  Tub/ Shower Transfer: Total assistance Functional mobility during ADLs:  (did not assess this date) General ADL Comments: Pt reluctant to work with therapy, however after bedside evaluation pt requested to use bathroom. Pt asked to use bedpan, however encouraged pt to exercise Bil LEs through standing and transferring to [facility]. Pt performed transfer with Min A and bearing weight through both legs to stand fully upright. Pt demonstrates slow processing and difficult to determine cognitive status with unknown baseline.     Mobility  Overal bed mobility: Needs Assistance Bed Mobility: Supine to Sit Rolling: Min assist Sidelying to sit: Min assist Supine to sit: Min assist Sit to supine: Min guard General bed mobility comments: cues for sequencing and initiation; minimal truncal assist to come to sit with assist of rail    Transfers  Overall transfer level: Needs assistance Equipment used: Rolling walker (2 wheeled);None Transfers: Sit to/from [employer]Stand;Stand Pivot Transfers Sit to Stand: Min assist;+2 physical assistance Stand pivot transfers: Mod assist;+2 safety/equipment (times 5) General transfer comment: cues for hand placement; assist to boost confidence and to help come forward.    Ambulation / Gait / Stairs / Wheelchair Mobility  Ambulation/Gait Ambulation/Gait assistance: Mod assist;+2 physical assistance Ambulation Distance (Feet):  (marched in place 3 steps before pt wanted to sit.) Assistive device: Rolling walker (2 wheeled) Gait Pattern/deviations: Step-to pattern;Decreased step length - right;Decreased step length - left;Narrow base of support;Shuffle General Gait Details: Short steps with max encouragement required to attempt ambulation    Posture / Balance Overall balance assessment: Needs assistance  Sitting-balance support: Feet supported;No upper extremity supported;Single extremity supported  Sitting balance-Leahy Scale: Fair   Standing balance support: Bilateral upper extremity supported  Standing balance-Leahy Scale: Poor    Special needs/care consideration BiPAP/CPAP No CPM No Continuous Drip IV No Dialysis No        Life Vest No Oxygen No Special Bed No Trach Size No Wound Vac (area) No     Skin Has dry, flaky skin                              Bowel mgmt: Last BM 07/08/13 Bladder mgmt: Voiding WDL, not incontinent. Diabetic mgmt Yes, managed with oral medications at home.  Son helps with meds.  Note: pt did get a bit labile with son present and son was  very encouraging with his mother.     Previous Home Environment Living Arrangements: Children;Other relatives Available Help at Discharge: Family;Available 24 hours/day Type of Home: Apartment Home Layout: One level Home Access: Level entry Bathroom Shower/Tub: Engineer, manufacturing systems: Standard Home Care Services: Yes Type of Home Care Services: Homehealth aide [facility] Care [facility] ([facility] Additional Comments: Pt poor historian, appears confused.  Discharge Living Setting Plans for Discharge Living Setting: Lives with (comment);Apartment (Lives with son and son's fiance.) Type of Home at Discharge: Apartment Discharge Home Layout: One level Discharge Home Access: Stairs to enter Entrance Stairs-Number of Steps: Flight of stairs up to apartment. Does the patient have any problems obtaining your medications?: No  Social/Family/Support Systems Patient Roles: Parent (Has son at home with her.) Contact Information: Vilinda Blanks - son Anticipated Caregiver: son Anticipated Caregiver's Contact Information: Mena Goes - son - [phone] Ability/Limitations of Caregiver: Son at home with 24/7 care.  Son's fiance in the home, but she works. Caregiver Availability: 24/7 Discharge Plan Discussed with Primary Caregiver:  (spoke with pt's son by phone on 6-18 and in person later that afternoon to complete paperwork) Is Caregiver  In Agreement with Plan?: Yes Does Caregiver/Family have Issues with Lodging/Transportation while Pt is in Rehab?: No  Goals/Additional Needs Patient/Family Goal for Rehab: PT supervision, OT/ST supervision and min assist goals Expected length of stay: ? 12-16 days Cultural Considerations: [employer] Dietary Needs: Carb mod med cal, thin liquids Equipment Needs: TBD Additional Information: Mostly I with dressing.  Needed help to go to bathroom.  Recently with progressive weakness.  Has a walker and a BSC near the bed at home. Pt/Family Agrees to Admission and willing to participate:  (spoke with pt by phone on 6-18 and in person later that afternoon) Program Orientation Provided & Reviewed with Pt/Caregiver Including Roles  & Responsibilities: Yes   Decrease burden of Care through IP rehab admission: N/A  Possible need for SNF placement upon discharge: Not anticipated   Patient Condition: This patient's condition remains as documented in the consult dated 07/09/13, in which the Rehabilitation Physician determined and documented that the patient's condition is appropriate for intensive rehabilitative care in an inpatient rehabilitation facility. Will admit to inpatient rehab today.  Preadmission Screen Completed By:  Juliann Mule, PT 07/09/2013 2:39 PM ______________________________________________________________________   Discussed status with Dr. Wynn Banker on 07/09/13 at 1500 and received telephone approval for admission today.  Admission Coordinator:  Juliann Mule, PT time 1500/Date 07/09/13

## 2013-07-09 NOTE — Consult Note (Signed)
Physical Medicine and Rehabilitation Consult Reason for Consult: MS exacerbation Referring Physician: [facility]   HPI: [name] [name] is a 43 y.o. right-handed female with history of hypertension, diabetes mellitus with peripheral neuropathy as well as multiple sclerosis diagnosed 2007. Patient lives with her son and used a Youth worker prior to admission. Presented 07/03/2013 with increasing weakness over the last 3-4 days as well as mild nausea with vomiting. There was report of a witnessed fall by her son striking her face without loss of consciousness. Cranial CT scan with no acute intracranial abnormalities. Severe changes of small vessel disease of the white matter consistent with multiple sclerosis. Placed on intravenous Solu-Medrol x5 days for suspect MS exacerbation completed 07/08/2013. Psychiatry was consulted in relation to depression placed on Wellbutrin. Subcutaneous Lovenox for DVT prophylaxis. Maintained on a regular consistency diet. Physical therapy evaluation completed an ongoing with recommendations of physical medicine rehabilitation consult.   Review of Systems  Gastrointestinal: Positive for constipation.  Musculoskeletal: Positive for falls and myalgias.  Neurological: Positive for dizziness and weakness.  Psychiatric/Behavioral: Positive for depression.  All other systems reviewed and are negative.  Past Medical History  Diagnosis Date  . Multiple sclerosis   . HYPERTENSION 09/30/2006  . DIABETES MELLITUS, TYPE II 09/30/2006  . TIA (transient ischemic attack)   . TOBACCO USER 09/30/2006    Qualifier: Diagnosis of  By: Delrae Alfred MD, Lanora Manis    . Multiple sclerosis, relapsing-remitting 05/29/2012  . DYSLIPIDEMIA 09/30/2006    Qualifier: Diagnosis of  By: Delrae Alfred MD, Lanora Manis    . DIABETES MELLITUS, TYPE II 09/30/2006    Qualifier: Diagnosis of  By: Delrae Alfred MD, Lanora Manis    . BENIGN POSITIONAL VERTIGO 01/27/2010    Qualifier: Diagnosis of  By: Delrae Alfred MD, Lanora Manis     . Stroke   . Neuromuscular disorder     hx of ms  . Depression    History reviewed. No pertinent past surgical history. Family History  Problem Relation Age of Onset  . Hypertension Mother   . Kidney failure Mother   . Hypertension Father   . Gout Father   . Hypertension Sister   . Diabetes Sister   . Hypertension Brother    Social History:  reports that she quit smoking about 2 years ago. She has never used smokeless tobacco. She reports that she does not drink alcohol or use illicit drugs. Allergies: No Known Allergies Medications Prior to Admission  Medication Sig Dispense Refill  . amLODipine (NORVASC) 10 MG tablet Take 1 tablet (10 mg total) by mouth daily.  30 tablet  0  . aspirin EC 81 MG tablet Take 81 mg by mouth daily.      . meclizine (ANTIVERT) 25 MG tablet Take 1 tablet (25 mg total) by mouth 3 (three) times daily as needed for dizziness or nausea.  90 tablet  11  . metFORMIN (GLUCOPHAGE) 500 MG tablet Take 1 tablet (500 mg total) by mouth daily with breakfast.  60 tablet  0  . metoprolol tartrate (LOPRESSOR) 25 MG tablet Take 25 mg by mouth 2 (two) times daily.      . potassium chloride SA (K-DUR,KLOR-CON) 20 MEQ tablet Take 1 tablet (20 mEq total) by mouth daily.  30 tablet  0    Home: Home Living Family/patient expects to be discharged to:: Private residence Living Arrangements: Children;Other relatives Available Help at Discharge: Family;Available 24 hours/day Type of Home: Apartment Home Access: Level entry Home Layout: One level Home Equipment: Dan Humphreys -  2 wheels;Cane - single point Additional Comments: Pt poor historian, appears confused. No family present. Attempted to contact son Olga Millers") without success.   Functional History: Prior Function Level of Independence: Needs assistance Gait / Transfers Assistance Needed: per pt, uses RW at home to get around. Pt states "I want a wheelchair so I can go further." ADL's / Homemaking Assistance Needed: per  pt report, son and his girlfriend assist with bathing and dressing (LB) and IADLs.  Communication / Swallowing Assistance Needed: Pt with noted slurred speech, slow to respond to questions. ADmitted w/ AMS & no family present to verify. Comments: pt reports falling several times when asked and states that her son helps her up. Unable to confirm with family.  Functional Status:  Mobility: Bed Mobility Overal bed mobility: Needs Assistance Bed Mobility: Supine to Sit Rolling: Min assist Sidelying to sit: Min assist Supine to sit: Min assist Sit to supine: Min guard General bed mobility comments: cues for sequencing and initiation; minimal truncal assist to come to sit with assist of rail Transfers Overall transfer level: Needs assistance Equipment used: Rolling walker (2 wheeled);None Transfers: Sit to/from [employer] to Stand: Min assist;+2 physical assistance Stand pivot transfers: Mod assist;+2 safety/equipment (times 5) General transfer comment: cues for hand placement; assist to boost confidence and to help come forward. Ambulation/Gait Ambulation/Gait assistance: Mod assist;+2 physical assistance Ambulation Distance (Feet):  (marched in place 3 steps before pt wanted to sit.) Assistive device: Rolling walker (2 wheeled) Gait Pattern/deviations: Step-to pattern;Decreased step length - right;Decreased step length - left;Narrow base of support;Shuffle General Gait Details: Short steps with max encouragement required to attempt ambulation    ADL: ADL Overall ADL's : Needs assistance/impaired Eating/Feeding: Set up;Sitting Eating/Feeding Details (indicate cue type and reason): Pt able to feed self without spills Grooming: Set up;Sitting Upper Body Bathing: Sitting;Moderate assistance;Cueing for sequencing Lower Body Bathing: Moderate assistance;Sit to/from stand;Cueing for sequencing Upper Body Dressing : Moderate assistance;Sitting Lower Body Dressing:  Maximal assistance;Sit to/from stand;Cueing for sequencing Toilet Transfer: Minimal assistance;Stand-pivot;RW;BSC Toilet Transfer Details (indicate cue type and reason): Requires min a for balance from [facility] to recliner.   Toileting- Clothing Manipulation and Hygiene: Total assistance;Sit to/from stand Toileting - Clothing Manipulation Details (indicate cue type and reason): pt propped RLE on bed and leaned back on [facility] for toilet hygiene.VC's for safety.  Tub/ Shower Transfer: Total assistance Functional mobility during ADLs:  (did not assess this date) General ADL Comments: Pt reluctant to work with therapy, however after bedside evaluation pt requested to use bathroom. Pt asked to use bedpan, however encouraged pt to exercise Bil LEs through standing and transferring to [facility]. Pt performed transfer with Min A and bearing weight through both legs to stand fully upright. Pt demonstrates slow processing and difficult to determine cognitive status with unknown baseline.   Cognition: Cognition Overall Cognitive Status: No family/caregiver present to determine baseline cognitive functioning Orientation Level: Oriented to person;Oriented to place;Disoriented to time;Disoriented to situation Cognition Arousal/Alertness: Awake/alert Behavior During Therapy: Flat affect Overall Cognitive Status: No family/caregiver present to determine baseline cognitive functioning Area of Impairment: Attention;Safety/judgement;Awareness;Problem solving;Memory Orientation Level: Disoriented to;Place;Time (pt states she is here because "I fell") Current Attention Level: Sustained Memory: Decreased short-term memory Following Commands: Follows one step commands with increased time Safety/Judgement: Decreased awareness of safety;Decreased awareness of deficits Awareness: Intellectual Problem Solving: Slow processing;Decreased initiation;Difficulty sequencing;Requires verbal cues;Requires tactile cues General Comments:  Difficult to determine cognitive status due to unknown baseline. Attempted to call pt's son with  no success.   Blood pressure 145/87, pulse 72, temperature 97.7 F (36.5 C), temperature source Oral, resp. rate 17, height 5\' 1"  (1.549 m), weight 60 kg (132 lb 4.4 oz), last menstrual period 08/23/2012, SpO2 100.00%. Physical Exam  Constitutional:  43 year old [place] American female appearing older than stated age.  HENT:  Head: Normocephalic.  Eyes: EOM are normal.  Neck: Normal range of motion. Neck supple. No thyromegaly present.  Cardiovascular: Normal rate and regular rhythm.   Respiratory: Effort normal and breath sounds normal. No respiratory distress.  GI: Soft. Bowel sounds are normal. She exhibits no distension.  Neurological: She is alert.  Mood is flat. She is a poor historian. She is able to provide as name, date of birth in place. She follows simple commands. Vision impaired. Very delayed processing. Moves all 4's but inconsistently.   Skin: Skin is warm and dry.  Psychiatric:  Flat, tearful.    Results for orders placed during the hospital encounter of 07/03/13 (from the past 24 hour(s))  GLUCOSE, CAPILLARY     Status: Abnormal   Collection Time    07/08/13  7:41 AM      Result Value Ref Range   Glucose-Capillary 126 (*) 70 - 99 mg/dL  GLUCOSE, CAPILLARY     Status: Abnormal   Collection Time    07/08/13 12:14 PM      Result Value Ref Range   Glucose-Capillary 170 (*) 70 - 99 mg/dL   Comment 1 Notify RN    GLUCOSE, CAPILLARY     Status: Abnormal   Collection Time    07/08/13  6:01 PM      Result Value Ref Range   Glucose-Capillary 191 (*) 70 - 99 mg/dL   Comment 1 Notify RN    GLUCOSE, CAPILLARY     Status: Abnormal   Collection Time    07/08/13  9:34 PM      Result Value Ref Range   Glucose-Capillary 190 (*) 70 - 99 mg/dL   No results found.  Assessment/Plan: Diagnosis: MS exacerbation 1. Does the need for close, 24 hr/day medical supervision in concert  with the patient's rehab needs make it unreasonable for this patient to be served in a less intensive setting? Yes and Potentially 2. Co-Morbidities requiring supervision/potential complications: htn, dm 3. Due to bladder management, bowel management, safety, skin/wound care, disease management, medication administration, pain management and patient education, does the patient require 24 hr/day rehab nursing? Potentially 4. Does the patient require coordinated care of a physician, rehab nurse, PT (1-2 hrs/day, 5 days/week), OT (1-2 hrs/day, 5 days/week) and SLP (1-2 hrs/day, 5 days/week) to address physical and functional deficits in the context of the above medical diagnosis(es)? Yes Addressing deficits in the following areas: balance, endurance, locomotion, strength, transferring, bowel/bladder control, bathing, dressing, feeding, grooming, toileting, cognition, speech, language, swallowing and psychosocial support 5. Can the patient actively participate in an intensive therapy program of at least 3 hrs of therapy per day at least 5 days per week? Yes and Potentially 6. The potential for patient to make measurable gains while on inpatient rehab is good and fair 7. Anticipated functional outcomes upon discharge from inpatient rehab are supervision  with PT, supervision and min assist with OT, supervision and min assist with SLP. 8. Estimated rehab length of stay to reach the above functional goals is: ?12-16 days 9. Does the patient have adequate social supports to accommodate these discharge functional goals? Potentially 10. Anticipated D/C setting: Home 11. Anticipated  post D/C treatments: HH therapy and Outpatient therapy 12. Overall Rehab/Functional Prognosis: good and fair  RECOMMENDATIONS: This patient's condition is appropriate for continued rehabilitative care in the following setting: see below Patient has agreed to participate in recommended program. N/A Note that insurance prior  authorization may be required for reimbursement for recommended care.  Comment: Pt very delayed. Minimal participation in my exam or questioning. I am unclear of her premorbid functional status. Can son provide for projected care needs once inpatient rehab completed. Rehab Admissions Coordinator to follow up.  Thanks,  Ranelle OysterZachary T. Swartz, MD, [place] DomFAAPMR     07/09/2013

## 2013-07-09 NOTE — Discharge Summary (Signed)
Physician Discharge Summary  [name] [name] ZOX:[id number] DOB: [birth year] DOA: 07/03/2013  PCP: Jeanann Lewandowsky, MD  Admit date: 07/03/2013 Discharge date: 07/08/13 Recommendations for Outpatient Follow-up:  1. Pt will need to follow up with PCP in 1 weeks post discharge 2. Follow up on multinodular goiter- see below 3. outpt dental f/u 4. bmet in 1 wk to check K and Na 5. Patient going to [facility] for rehab  History of present illness:  43 y.o. female with PMH of HTN, DM, MS presented with frequent falls; Pt is not well ambulatory due to MS, but per her son she has been falling a lot, recently has R sided facial injury due to fall; Patient also reports having nausea, vomiting and diarrhea x 1 day, no abd pain, no hematemesis; since admission, the patient's vomiting and diarrhea have resolved. In fact, her diet has been advanced which she has tolerated. Neurology was consulted to see the patient. MRI of the brain was consistent with acute demyelination concerning for multiple sclerosis exacerbation.   Discharge Diagnoses:  MS Exacerbation  -accounting for her frequent falls and weakness  -CT head: chronic changes; no s/s of infection; afebrile; no leukocytosis;  -MRI brain shows enhancing white matter lesions c/w active demyelination  -IV solumedrol per neurology--Pt received 5 days of IV solumedrol -appreciate neuro f/u  -EEG neg for seizure predisposition   Nausea, vomiting  -? Gastroenteritis; vs metformin related;  -abd exam benign; no s/s of acute abdomen  -improved since admission--no further vomiting, feeling hungry--tolerating diet  -change IVF to 1/2NS-->Na improved to 141  Hyperbilirubinemia  -?Gilbert's vs cholestasis  -has been chronically mildly elevated  -fractionate bili-->unable as bili has improved  -abd [place] neg for cholecystitis or ductal dilatation  - bili normalized after admission  Hypokalemia  -likely due to GI loss; IV replace   DM2, controlled  -last  HA1-5.2 on 05/10/13  -has been getting insulin due to steroids  -cont Metformin  HTN  -Stable  -continue metoprolol tartrate and amlodipine at home dosesdose   L facial trauma;  -CT: -Periapical lucency involving the 3rd left mandibular molar (tooth  16), query loosening versus abscess-->orthopantogram--unable to perform due to inability to stand without assistance  -no obvious drainable abscess on exam  -Needs outpatient dental followup; presently no pain   Multinodular goiter.  -Dominant 2.4 cm thyroid nodule  -obtain TSH--0.083, free t4--1.65  -thyroid US--confirms multinodular goiter  -may have a degree of subclinical hyperthyroidsm, but recent steroids may have influenced results  -outpt followup   Dementia? 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[facility] (Multiple Sclerosis)"- psych did start her on Wellbutrin- this has not been discussed with the son- will hold off on this treatment for now  Discharge Condition: Stable  Disposition: Home with home health   Diet:carb modified  Consultants: Neurology--Kirkpatrick  Wt Readings from Last 3 Encounters:  07/07/13 60 kg (132 lb 4.4 oz)  06/07/13 57.924 kg (127 lb 11.2 oz)  05/10/13 57.788 kg (127 lb 6.4 oz)    Discharge Exam: Filed Vitals:   07/09/13 0550  BP: 145/87  Pulse: 72  Temp: 97.7 F (36.5 C)  Resp:  17   Filed Vitals:   07/08/13 0550 07/08/13 1450 07/08/13 2109 07/09/13 0550  BP: 142/80 140/82 156/87 145/87  Pulse:  72 67 72  Temp:  97.6 F (36.4 C) 98.5 F (36.9 C) 97.7 F (36.5 C)  TempSrc:  Oral Oral Oral  Resp:  16 16 17   Height:      Weight:      SpO2:  95% 100% 100%   General:  Alert and awake- mild confusion as mentioned above, NAD, pleasant, cooperative Cardiovascular: RRR, no rub, no gallop, no S3 Respiratory: CTAB, no wheeze, no rhonchi Abdomen:soft, nontender, nondistended, positive bowel sounds Extremities: No edema, No lymphangitis, no petechiae Neuro: strength 5/5 in all extremities, CN 2-12 intact- slow to respond verbally with slow speech  Discharge Instructions      Discharge Instructions   Diet - low sodium heart healthy    Complete by:  As directed      Increase activity slowly    Complete by:  As directed             Medication List         amLODipine 10 MG tablet  Commonly known as:  NORVASC  Take 1 tablet (10 mg total) by mouth daily.     aspirin EC 81 MG tablet  Take 81 mg by mouth daily.     meclizine 25 MG tablet  Commonly known as:  ANTIVERT  Take 1 tablet (25 mg total) by mouth 3 (three) times daily as needed for dizziness or nausea.     metFORMIN 500 MG tablet  Commonly known as:  GLUCOPHAGE  Take 1 tablet (500 mg total) by mouth daily with breakfast.     metoprolol tartrate 25 MG tablet  Commonly known as:  LOPRESSOR  Take 25 mg by mouth 2 (two) times daily.     potassium chloride SA 20 MEQ tablet  Commonly known as:  K-DUR,KLOR-CON  Take 1 tablet (20 mEq total) by mouth daily.         The results of significant diagnostics from this hospitalization (including imaging, microbiology, ancillary and laboratory) are listed below for reference.    Significant Diagnostic Studies: Dg Chest 2 View  07/03/2013   CLINICAL DATA:  Traumatic injury and pain  EXAM: CHEST  2 VIEW  COMPARISON:  06/02/2013  FINDINGS: The heart size and mediastinal contours are within normal limits. Both lungs are clear. The visualized skeletal structures are unremarkable.  IMPRESSION: No active cardiopulmonary disease.   Electronically Signed   By: Alcide Clever M.D.   On: 07/03/2013 13:18   Ct Head Wo Contrast  07/03/2013   CLINICAL DATA:   Current history of multiple sclerosis. Patient fell forward onto the floor, face 1st, sustaining a laceration to the lip. Brief loss of consciousness.  EXAM: CT HEAD WITHOUT CONTRAST  CT MAXILLOFACIAL WITHOUT CONTRAST  CT CERVICAL SPINE WITHOUT CONTRAST  TECHNIQUE: Multidetector CT imaging of the head, cervical spine, and maxillofacial structures were performed using the standard protocol without intravenous contrast. Multiplanar CT image reconstructions of the cervical spine and maxillofacial structures were also generated.  COMPARISON:  MRI brain 06/04/2013, 09/06/2012. CT head 06/03/2013, 05/28/2012, 03/15/2010.  FINDINGS: CT HEAD FINDINGS  Patient's head is turned to the right. Ventricular system normal in size and appearance for age. Severe changes of small vessel disease of the white matter consistent with given history of MS, unchanged. No mass lesion. No midline shift. No acute hemorrhage or hematoma. No extra-axial fluid collections. No evidence  of acute infarction.  No skull fracture or other focal osseous abnormality involving the skull. Bilateral mastoid air cells and middle ear cavities well aerated. Mild bilateral carotid siphon atherosclerosis.  CT MAXILLOFACIAL FINDINGS  Soft tissue swelling/ecchymosis involving the lower left cheek, overlying the left maxilla. No fractures identified involving the facial bones. Temporomandibular joints intact. Periapical lucency involving the 3rd molar in the left side of the mandible (tooth 16).  Bony nasal septum midline. Paranasal sinuses well aerated. Left concha bullosa.  Both orbits and both globes intact.  CT CERVICAL SPINE FINDINGS  Patient's head is turned to the right, accounting for the rotation at C1-C2. No fractures identified involving the cervical spine. Soft tissue window images demonstrate no frank disc protrusions. Sagittal reconstructed images demonstrate straightening of the usual lordosis with anatomic posterior alignment. Mild disc space  narrowing at C4-5 and C5-6. Borderline spinal stenosis at C4-5. Facet joints intact throughout without significant degenerative change. Coronal reformatted images demonstrate an intact craniocervical junction, intact C1-C2 articulation, intact dens, and intact lateral masses throughout. No significant bony foraminal stenoses.  Visualized lung apices clear. Thyroid gland enlarged with multiple nodules, the largest approximating 2.4 cm.  IMPRESSION: 1. No acute intracranial abnormality. 2. Severe changes of small vessel disease of the white matter consistent with the given diagnosis of MS, not significantly changed since 2012. 3. No facial bone fractures. 4. Periapical lucency involving the 3rd left mandibular molar (tooth 16), query loosening versus abscess. 5. No cervical spine fractures. 6. Mild degenerative disc disease at C4-5 and C5-6 with borderline spinal stenosis at C4-5. 7. Multinodular goiter. Dominant 2.4 cm thyroid nodule. Consider further evaluation with nonemergent thyroid ultrasound. If patient is clinically hyperthyroid, consider nuclear medicine thyroid uptake and scan.   Electronically Signed   By: Hulan Saas M.D.   On: 07/03/2013 13:32   Ct Cervical Spine Wo Contrast  07/03/2013   CLINICAL DATA:  Current history of multiple sclerosis. Patient fell forward onto the floor, face 1st, sustaining a laceration to the lip. Brief loss of consciousness.  EXAM: CT HEAD WITHOUT CONTRAST  CT MAXILLOFACIAL WITHOUT CONTRAST  CT CERVICAL SPINE WITHOUT CONTRAST  TECHNIQUE: Multidetector CT imaging of the head, cervical spine, and maxillofacial structures were performed using the standard protocol without intravenous contrast. Multiplanar CT image reconstructions of the cervical spine and maxillofacial structures were also generated.  COMPARISON:  MRI brain 06/04/2013, 09/06/2012. CT head 06/03/2013, 05/28/2012, 03/15/2010.  FINDINGS: CT HEAD FINDINGS  Patient's head is turned to the right. Ventricular  system normal in size and appearance for age. Severe changes of small vessel disease of the white matter consistent with given history of MS, unchanged. No mass lesion. No midline shift. No acute hemorrhage or hematoma. No extra-axial fluid collections. No evidence of acute infarction.  No skull fracture or other focal osseous abnormality involving the skull. Bilateral mastoid air cells and middle ear cavities well aerated. Mild bilateral carotid siphon atherosclerosis.  CT MAXILLOFACIAL FINDINGS  Soft tissue swelling/ecchymosis involving the lower left cheek, overlying the left maxilla. No fractures identified involving the facial bones. Temporomandibular joints intact. Periapical lucency involving the 3rd molar in the left side of the mandible (tooth 16).  Bony nasal septum midline. Paranasal sinuses well aerated. Left concha bullosa.  Both orbits and both globes intact.  CT CERVICAL SPINE FINDINGS  Patient's head is turned to the right, accounting for the rotation at C1-C2. No fractures identified involving the cervical spine. Soft tissue window images demonstrate no frank disc protrusions. Sagittal reconstructed  images demonstrate straightening of the usual lordosis with anatomic posterior alignment. Mild disc space narrowing at C4-5 and C5-6. Borderline spinal stenosis at C4-5. Facet joints intact throughout without significant degenerative change. Coronal reformatted images demonstrate an intact craniocervical junction, intact C1-C2 articulation, intact dens, and intact lateral masses throughout. No significant bony foraminal stenoses.  Visualized lung apices clear. Thyroid gland enlarged with multiple nodules, the largest approximating 2.4 cm.  IMPRESSION: 1. No acute intracranial abnormality. 2. Severe changes of small vessel disease of the white matter consistent with the given diagnosis of MS, not significantly changed since 2012. 3. No facial bone fractures. 4. Periapical lucency involving the 3rd left  mandibular molar (tooth 16), query loosening versus abscess. 5. No cervical spine fractures. 6. Mild degenerative disc disease at C4-5 and C5-6 with borderline spinal stenosis at C4-5. 7. Multinodular goiter. Dominant 2.4 cm thyroid nodule. Consider further evaluation with nonemergent thyroid ultrasound. If patient is clinically hyperthyroid, consider nuclear medicine thyroid uptake and scan.   Electronically Signed   By: Hulan Saashomas  Lawrence M.D.   On: 07/03/2013 13:32   Mr Laqueta JeanBrain W UJWo Contrast  07/03/2013   CLINICAL DATA:  MS exacerbation versus CVA  EXAM: MRI HEAD WITHOUT AND WITH CONTRAST  TECHNIQUE: Multiplanar, multiecho pulse sequences of the brain and surrounding structures were obtained without and with intravenous contrast.  CONTRAST:  12mL MULTIHANCE GADOBENATE DIMEGLUMINE 529 MG/ML IV SOLN  COMPARISON:  Prior MRI from 06/04/2013.  FINDINGS: Diffuse cerebral atrophy is unchanged. No mass lesion, midline shift, or extra-axial fluid collection. Ventricles are normal in size without evidence of hydrocephalus.  Multiple T2/FLAIR hyperdensities again seen throughout the cerebral white matter bilaterally, compatible with known history of multiple sclerosis. Multiple cystic areas of demyelinization again noted. Involvement of the central pons and brachium pontis bilaterally, right greater than left again noted as well. On post-contrast sequences, many newly active lesions demonstrate incomplete rings of peripheral enhancement, compatible with active demyelination. For reference purposes, the largest of these lesions within the left cerebral hemisphere is located within the deep/subcortical white matter of the left frontoparietal region and measures 2.0 x 0.9 cm (series 12, image 28). The largest lesion within the right cerebral hemisphere is seen within the right centrum semi ovale and measures approximately 6 mm (series 12, image 35). Multiple additional punctate areas of enhancement seen within the deep white  matter of the bilateral frontal lobes (series 12, image 29, 28, 27). There is a 5 mm enhancing lesion within the right corpus callosum (series 12, image 25). Large enhancing lesion within the right pons measures 1.0 x 1.4 cm (series 12, image 15). Additional enhancing lesion present within the right middle cerebellar peduncle.  Several punctate foci of enhancement seen within the partially visualized upper cervical spinal cord at the level of C2 and C3 (series 14, image 11).  No diffusion-weighted signal abnormality is identified to suggest acute intracranial infarct. Gray-white matter differentiation is maintained. Normal flow voids are seen within the intracranial vasculature. No intracranial hemorrhage identified.  The cervicomedullary junction is normal. Pituitary gland is within normal limits. Pituitary stalk is midline. The globes and optic nerves demonstrate a normal appearance with normal signal intensity.  The bone marrow signal intensity is normal. Calvarium is intact. Visualized upper cervical spine is within normal limits.  Scalp soft tissues are unremarkable.  Paranasal sinuses are clear.  No mastoid effusion.  IMPRESSION: 1. Findings consistent with acute multiple sclerosis exacerbation with multiple enhancing white matter lesions within the supratentorial and infratentorial brain  as above, compatible with active demyelination. 2. Punctate foci of enhancement within the upper cervical spinal cord at the level of C2 and C3, also consistent with active demyelination. 3. No evidence of acute intracranial infarct.   Electronically Signed   By: Rise Mu M.D.   On: 07/03/2013 23:14   US Soft Tissue Head/neck  07/03/2013   CLINICAL DATA:  Evaluate thyroid nodules  EXAM: THYROID ULTRASOUND  TECHNIQUE: Ultrasound examination of the thyroid gland and adjacent soft tissues was performed.  COMPARISON:  None.  FINDINGS: Right thyroid lobe  Measurements: 5.2 x 2.2 by 2.2 cm. There are several solid  nodules within the right lobe. The largest is in the upper pole measuring 2.4 x 1.5 x 2.4 cm. This is in the mid portion of the right lobe adjacent to the isthmus. There are no microcalcifications or hypervascularity associated with this well-circumscribed nodule.  Left thyroid lobe  Measurements: 3.6 x 1.6 x 1.1 cm. Multiple sub cm nodules are identified within the left lobe. The largest is in the inferior pole measuring 1 x 0.5 x 0.9 cm.  Isthmus  Thickness: The isthmus measures 0.8 cm.  Lymphadenopathy  None visualized.  IMPRESSION: 1. Multi nodular thyroid gland. The largest nodule is in the upper pole of the right lobe. Findings meet consensus criteria for biopsy. Ultrasound-guided fine needle aspiration should be considered, as per the consensus statement: Management of Thyroid Nodules Detected at [place]: Society of Radiologists in Ultrasound Consensus Conference Statement. Radiology 2005; [id number]. 2. No adenopathy identified.   Electronically Signed   By: Signa Kell M.D.   On: 07/03/2013 18:42   US Abdomen Complete  07/03/2013   CLINICAL DATA:  Elevated bilirubin  EXAM: ULTRASOUND ABDOMEN COMPLETE  COMPARISON:  Acute abdominal series 05/09/2013.  FINDINGS: Gallbladder:  Shadowing gallstones are present. No sonographic Eulah Pont sign is reported. Wall thickness is within normal limits at 2 mm.  Common bile duct:  Diameter: 3.0 mm, within normal limits.  Liver:  No focal lesion identified. Within normal limits in parenchymal echogenicity.  IVC:  No abnormality visualized.  Pancreas:  Visualized portion unremarkable.  Spleen:  Size and appearance within normal limits.  Right Kidney:  Length: 10.7 cm, within normal limits. Echogenicity within normal limits. No mass or hydronephrosis visualized.  Left Kidney:  Length: 9.6 cm, within normal limits. The left kidney is poorly visualized. No definite mass lesion or hydronephrosis is evident.  Abdominal aorta:  2.1 cm, within normal limits.  Other findings:  1.  Cholelithiasis without evidence for cholecystitis. 2. No acute abnormality.   Electronically Signed   By: Gennette Pac M.D.   On: 07/03/2013 18:38   Ct Maxillofacial Wo Cm  07/03/2013   CLINICAL DATA:  Current history of multiple sclerosis. Patient fell forward onto the floor, face 1st, sustaining a laceration to the lip. Brief loss of consciousness.  EXAM: CT HEAD WITHOUT CONTRAST  CT MAXILLOFACIAL WITHOUT CONTRAST  CT CERVICAL SPINE WITHOUT CONTRAST  TECHNIQUE: Multidetector CT imaging of the head, cervical spine, and maxillofacial structures were performed using the standard protocol without intravenous contrast. Multiplanar CT image reconstructions of the cervical spine and maxillofacial structures were also generated.  COMPARISON:  MRI brain 06/04/2013, 09/06/2012. CT head 06/03/2013, 05/28/2012, 03/15/2010.  FINDINGS: CT HEAD FINDINGS  Patient's head is turned to the right. Ventricular system normal in size and appearance for age. Severe changes of small vessel disease of the white matter consistent with given history of MS, unchanged. No mass lesion. No midline shift.  No acute hemorrhage or hematoma. No extra-axial fluid collections. No evidence of acute infarction.  No skull fracture or other focal osseous abnormality involving the skull. Bilateral mastoid air cells and middle ear cavities well aerated. Mild bilateral carotid siphon atherosclerosis.  CT MAXILLOFACIAL FINDINGS  Soft tissue swelling/ecchymosis involving the lower left cheek, overlying the left maxilla. No fractures identified involving the facial bones. Temporomandibular joints intact. Periapical lucency involving the 3rd molar in the left side of the mandible (tooth 16).  Bony nasal septum midline. Paranasal sinuses well aerated. Left concha bullosa.  Both orbits and both globes intact.  CT CERVICAL SPINE FINDINGS  Patient's head is turned to the right, accounting for the rotation at C1-C2. No fractures identified involving the cervical  spine. Soft tissue window images demonstrate no frank disc protrusions. Sagittal reconstructed images demonstrate straightening of the usual lordosis with anatomic posterior alignment. Mild disc space narrowing at C4-5 and C5-6. Borderline spinal stenosis at C4-5. Facet joints intact throughout without significant degenerative change. Coronal reformatted images demonstrate an intact craniocervical junction, intact C1-C2 articulation, intact dens, and intact lateral masses throughout. No significant bony foraminal stenoses.  Visualized lung apices clear. Thyroid gland enlarged with multiple nodules, the largest approximating 2.4 cm.  IMPRESSION: 1. No acute intracranial abnormality. 2. Severe changes of small vessel disease of the white matter consistent with the given diagnosis of MS, not significantly changed since 2012. 3. No facial bone fractures. 4. Periapical lucency involving the 3rd left mandibular molar (tooth 16), query loosening versus abscess. 5. No cervical spine fractures. 6. Mild degenerative disc disease at C4-5 and C5-6 with borderline spinal stenosis at C4-5. 7. Multinodular goiter. Dominant 2.4 cm thyroid nodule. Consider further evaluation with nonemergent thyroid ultrasound. If patient is clinically hyperthyroid, consider nuclear medicine thyroid uptake and scan.   Electronically Signed   By: Hulan Saas M.D.   On: 07/03/2013 13:32   Microbiology: No results found for this or any previous visit (from the past 240 hour(s)).   Labs: Basic Metabolic Panel:  Recent Labs Lab 07/03/13 1155 07/03/13 1208 07/04/13 0623 07/05/13 0431 07/06/13 0719 07/07/13 0520  NA 146  --  142 145 145 141  K 2.9*  --  4.0 4.3 3.9 3.6*  CL 104  --  103 109 110 105  CO2 24  --  24 22 23 23   GLUCOSE 129*  --  103* 158* 170* 160*  BUN 8  --  6 5* 8 13  CREATININE 0.52  --  0.58 0.46* 0.48* 0.52  CALCIUM 10.0  --  9.4 9.5 9.5 9.1  MG  --  2.0  --   --   --   --    Liver Function Tests:  Recent  Labs Lab 07/03/13 1155 07/04/13 0623 07/05/13 0431 07/06/13 0719  AST 19 24 12 11   ALT 15 12 12 13   ALKPHOS 91 79 73 79  BILITOT 1.8* 2.2* 1.2 0.8  PROT 7.9 7.2 6.8 6.8  ALBUMIN 4.5 3.9 3.5 3.5    Recent Labs Lab 07/03/13 1919  LIPASE 46   No results found for this basename: AMMONIA,  in the last 168 hours CBC:  Recent Labs Lab 07/03/13 1155  WBC 7.9  NEUTROABS 5.8  HGB 14.6  HCT 42.1  MCV 92.9  PLT 281   Cardiac Enzymes:  Recent Labs Lab 07/03/13 1208  TROPONINI <0.30   BNP: No components found with this basename: POCBNP,  CBG:  Recent Labs Lab 07/08/13 1214 07/08/13 1801 07/08/13  2134 07/09/13 0827 07/09/13 1205  GLUCAP 170* 191* 190* 127* 122*    Time coordinating discharge:  Greater than 30 minutes  Signed:  Calvert Cantor, MD [facility] Pager: [phone] 07/09/2013, 12:14 PM

## 2013-07-09 NOTE — Progress Notes (Signed)
[name] [name] Rehab Admission Coordinator Signed Physical Medicine and Rehabilitation PMR Pre-admission Service date: 07/09/2013 12:17 PM  Related encounter: Admission (Discharged) from 07/03/2013 in [facility] SURG[facility]   PMR Admission Coordinator Pre-Admission Assessment   Patient: [name] [name] is an 43 y.o., female MRN: [id number] DOB: [birth year] Height: 5\' 1"  (154.9 cm) Weight: 60 kg (132 lb 4.4 oz)                                                                                                                                                  Insurance Information HMO:  No    PPO:       PCP:       IPA:       80/20:       OTHER:    PRIMARY: Medicare A/B      Policy#: [id number]      Subscriber: [name] [name] CM Name:        Phone#:       Fax#:    Pre-Cert#:        Employer: Not employed, disabled Benefits:  Phone #:       Name: Checked in [place]. Date: 11/22/12     Deduct: $1260      Out of Pocket Max: none      Life Max: unlimited CIR: 100%      SNF: 100 days Outpatient: 80%     Co-Pay: 20% Home Health: 100%      Co-Pay: none DME: 80%     Co-Pay: 20% Providers: patient's choice  SECONDARY: Medicaid [place] access      Policy#: [id number]      Subscriber: [name] [name] CM Name:        Phone#:       Fax#:    Pre-Cert#:        Employer: Not employed, disabled Benefits:  Phone #: [phone]     Name: Automated Eff. Date: Eligible 07/09/13     Deduct:        Out of Pocket Max:        Life Max:    CIR:        SNF:    Outpatient:       Co-Pay:    Home Health:        Co-Pay:    DME:       Co-Pay:     Emergency Contact Information Contact Information     Name  Relation  Home  Work  Mobile     [name] [name]  Son  [phone]         [name] [name]  [phone]           Current Medical History  Patient Admitting Diagnosis:  MS exacerbation   History of Present Illness: A 43 y.o. right-handed female with history of  hypertension, diabetes mellitus with peripheral neuropathy as well as multiple sclerosis diagnosed 2007. Patient lives with her son and used a [employer] workercane/walker prior to admission. Presented 07/03/2013 with increasing weakness over the last 3-4 days as well as mild nausea with vomiting. There was report of a witnessed fall by her son striking her face without loss of consciousness. Cranial CT scan with no acute intracranial abnormalities. Severe changes of small vessel disease of the white matter consistent with multiple sclerosis. Placed on intravenous Solu-Medrol x5 days for suspect MS exacerbation completed 07/08/2013. Psychiatry was consulted in relation to depression placed on Wellbutrin. Subcutaneous Lovenox for DVT prophylaxis. Maintained on a regular consistency diet. Physical therapy evaluation completed an ongoing with recommendations of physical medicine rehabilitation consult.     Past Medical History  Past Medical History   Diagnosis  Date   .  Multiple sclerosis     .  HYPERTENSION  09/30/2006   .  DIABETES MELLITUS, TYPE II  09/30/2006   .  TIA (transient ischemic attack)     .  TOBACCO USER  09/30/2006       Qualifier: Diagnosis of  By: [name] AlfredMulberry MD, [name] [name]     .  Multiple sclerosis, relapsing-remitting  05/29/2012   .  DYSLIPIDEMIA  09/30/2006       Qualifier: Diagnosis of  By: [name] AlfredMulberry MD, [name] [name]     .  DIABETES MELLITUS, TYPE II  09/30/2006       Qualifier: Diagnosis of  By: [name] AlfredMulberry MD, [name] [name]     .  BENIGN POSITIONAL VERTIGO  01/27/2010       Qualifier: Diagnosis of  By: [name] AlfredMulberry MD, [name] [name]     .  Stroke     .  Neuromuscular disorder         hx of ms   .  Depression        Family History  family history includes Diabetes in her sister; Gout in her father; Hypertension in her brother, father, mother, and sister; Kidney failure in her mother.   Prior Rehab/Hospitalizations:  [facility] for bath and BP checks.        Current Medications   Current facility-administered medications:acetaminophen (TYLENOL) suppository 650 mg, 650 mg, Rectal, Q6H PRN, [name] SheetsUlugbek N Buriev, MD;  acetaminophen (TYLENOL) tablet 650 mg, 650 mg, Oral, Q6H PRN, [name] SheetsUlugbek N Buriev, MD;  amLODipine (NORVASC) tablet 5 mg, 5 mg, Oral, Daily, [name] Hartshornavid Tat, MD, 5 mg at 07/09/13 1022;  aspirin EC tablet 81 mg, 81 mg, Oral, Daily, [name] SheetsUlugbek N Buriev, MD, 81 mg at 07/09/13 1022 buPROPion (WELLBUTRIN XL) 24 hr tablet 150 mg, 150 mg, Oral, Daily, [name] SettleJanardhaha R Jonnalagadda, MD, 150 mg at 07/09/13 1022;  enoxaparin (LOVENOX) injection 40 mg, 40 mg, Subcutaneous, Q24H, [name] SheetsUlugbek N Buriev, MD, 40 mg at 07/08/13 1857;  insulin aspart (novoLOG) injection 0-9 Units, 0-9 Units, Subcutaneous, TID WC, [name] Hartshornavid Tat, MD, 1 Units at 07/09/13 1320 meclizine (ANTIVERT) tablet 25 mg, 25 mg, Oral, TID PRN, [name] SheetsUlugbek N Buriev, MD, 25 mg at 07/07/13 1017;  metoprolol tartrate (LOPRESSOR) tablet 25 mg, 25 mg, Oral, BID, [name] SheetsUlugbek N Buriev, MD, 25 mg at 07/09/13 1022;  ondansetron (ZOFRAN) injection 4 mg, 4 mg, Intravenous, Q6H PRN, [name] SheetsUlugbek N Buriev, MD;  ondansetron (ZOFRAN) tablet 4 mg, 4 mg, Oral, Q6H PRN, [name] SheetsUlugbek N Buriev, MD potassium chloride SA (K-DUR,KLOR-CON) CR tablet 20 mEq, 20 mEq, Oral, Daily, [name] Hartshornavid Tat, MD, 20 mEq at 07/09/13 1022   Patients Current Diet: Carb  Control   Precautions / Restrictions Precautions Precautions: Fall Restrictions Weight Bearing Restrictions: No    Prior Activity Level Household: pt had been staying at home. Just recently moved to a new apartment and basically was homebound. When she was feeling stronger, she would go on errands with her son. Son does the driving. Son reports that pt became more depressed after losing her job 3 years ago and after caring for several sick family members (two brothers passed away).   Home Assistive Devices / Equipment Home Assistive Devices/Equipment: Dan Humphreys (specify type);Bedside commode/3-in-1 Home Equipment: Walker - 2 wheels;Cane  - single point   Prior Functional Level Prior Function Level of Independence: Needs assistance Gait / Transfers Assistance Needed: per pt, uses RW at home to get around. Pt states "I want a wheelchair so I can go further." ADL's / Homemaking Assistance Needed: per pt report, son and his girlfriend assist with bathing and dressing (LB) and IADLs.   Communication / Swallowing Assistance Needed: Pt with noted slurred speech, slow to respond to questions. ADmitted w/ AMS & no family present to verify. Comments: pt reports falling several times when asked and states that her son helps her up. Unable to confirm with family.    Current Functional Level Cognition   Overall Cognitive Status: No family/caregiver present to determine baseline cognitive functioning Current Attention Level: Sustained Orientation Level: Oriented to person;Oriented to place;Oriented to situation;Disoriented to time Following Commands: Follows one step commands with increased time Safety/Judgement: Decreased awareness of safety;Decreased awareness of deficits General Comments: Difficult to determine cognitive status due to unknown baseline. Attempted to call pt's son with no success.      Extremity Assessment (includes Sensation/Coordination)    Upper Extremity Assessment: Defer to OT evaluation (L worse than R)  Lower Extremity Assessment: Generalized weakness;RLE deficits/detail;LLE deficits/detail (significant weakness and ataxia bilaterally)   RLE Deficits / Details: gross extension 3+ at best   LLE Deficits / Details: L appears weaker than R by MMT and function Very weak pf/dr    ADLs   Overall ADL's : Needs assistance/impaired Eating/Feeding: Set up;Sitting Eating/Feeding Details (indicate cue type and reason): Pt able to feed self without spills Grooming: Set up;Sitting Upper Body Bathing: Sitting;Moderate assistance;Cueing for sequencing Lower Body Bathing: Moderate assistance;Sit to/from stand;Cueing for  sequencing Upper Body Dressing : Moderate assistance;Sitting Lower Body Dressing: Maximal assistance;Sit to/from stand;Cueing for sequencing Toilet Transfer: Minimal assistance;Stand-pivot;RW;BSC Toilet Transfer Details (indicate cue type and reason): Requires min a for balance from [facility] to recliner.    Toileting- Clothing Manipulation and Hygiene: Total assistance;Sit to/from stand Toileting - Clothing Manipulation Details (indicate cue type and reason): pt propped RLE on bed and leaned back on [facility] for toilet hygiene.VC's for safety.   Tub/ Shower Transfer: Total assistance Functional mobility during ADLs:  (did not assess this date) General ADL Comments: Pt reluctant to work with therapy, however after bedside evaluation pt requested to use bathroom. Pt asked to use bedpan, however encouraged pt to exercise Bil LEs through standing and transferring to [facility]. Pt performed transfer with Min A and bearing weight through both legs to stand fully upright. Pt demonstrates slow processing and difficult to determine cognitive status with unknown baseline.      Mobility   Overal bed mobility: Needs Assistance Bed Mobility: Supine to Sit Rolling: Min assist Sidelying to sit: Min assist Supine to sit: Min assist Sit to supine: Min guard General bed mobility comments: cues for sequencing and initiation; minimal truncal assist to come to sit  with assist of rail      Transfers   Overall transfer level: Needs assistance Equipment used: Rolling walker (2 wheeled);None Transfers: Sit to/from [employer] to Stand: Min assist;+2 physical assistance Stand pivot transfers: Mod assist;+2 safety/equipment (times 5) General transfer comment: cues for hand placement; assist to boost confidence and to help come forward.      Ambulation / Gait / Stairs / Wheelchair Mobility   Ambulation/Gait Ambulation/Gait assistance: Mod assist;+2 physical assistance Ambulation Distance (Feet):  (marched in  place 3 steps before pt wanted to sit.) Assistive device: Rolling walker (2 wheeled) Gait Pattern/deviations: Step-to pattern;Decreased step length - right;Decreased step length - left;Narrow base of support;Shuffle General Gait Details: Short steps with max encouragement required to attempt ambulation      Posture / Balance  Overall balance assessment: Needs assistance   Sitting-balance support: Feet supported;No upper extremity supported;Single extremity supported   Sitting balance-Leahy Scale: Fair  Standing balance support: Bilateral upper extremity supported   Standing balance-Leahy Scale: Poor      Special needs/care consideration  BiPAP/CPAP No CPM No Continuous Drip IV No Dialysis No         Life Vest No Oxygen No Special Bed No Trach Size No Wound Vac (area) No      Skin Has dry, flaky skin                               Bowel mgmt: Last BM 07/08/13 Bladder mgmt: Voiding WDL, not incontinent. Diabetic mgmt Yes, managed with oral medications at home.  Son helps with meds.   Note: pt did get a bit labile with son present and son was very encouraging with his mother.         Previous Home Environment Living Arrangements: Children;Other relatives Available Help at Discharge: Family;Available 24 hours/day Type of Home: Apartment Home Layout: One level Home Access: Level entry Bathroom Shower/Tub: Engineer, manufacturing systems: Standard Home Care Services: Yes Type of Home Care Services: Homehealth aide [facility] Additional Comments: Pt poor historian, appears confused.   Discharge Living Setting Plans for Discharge Living Setting: Lives with (comment);Apartment (Lives with son and son's fiance.) Type of Home at Discharge: Apartment Discharge Home Layout: One level Discharge Home Access: Stairs to enter Entrance Stairs-Number of Steps: Flight of stairs up to apartment. Does the patient have any problems obtaining your  medications?: No   Social/Family/Support Systems Patient Roles: Parent (Has son at home with her.) Contact Information: Vilinda Blanks - son Anticipated Caregiver: son Anticipated Caregiver's Contact Information: Mena Goes - son - [phone] Ability/Limitations of Caregiver: Son at home with 24/7 care.  Son's fiance in the home, but she works. Caregiver Availability: 24/7 Discharge Plan Discussed with Primary Caregiver:  (spoke with pt's son by phone on 6-18 and in person later that afternoon to complete paperwork) Is Caregiver In Agreement with Plan?: Yes Does Caregiver/Family have Issues with Lodging/Transportation while Pt is in Rehab?: No   Goals/Additional Needs Patient/Family Goal for Rehab: PT supervision, OT/ST supervision and min assist goals Expected length of stay: ? 12-16 days Cultural Considerations: [employer] Dietary Needs: Carb mod med cal, thin liquids Equipment Needs: TBD Additional Information: Mostly I with dressing.  Needed help to go to bathroom.  Recently with progressive weakness.  Has a walker and a BSC near the bed at home. Pt/Family Agrees to Admission and willing to participate:  (spoke with pt by  phone on 6-18 and in person later that afternoon) Program Orientation Provided & Reviewed with Pt/Caregiver Including Roles  & Responsibilities: Yes     Decrease burden of Care through IP rehab admission: N/A   Possible need for SNF placement upon discharge: Not anticipated     Patient Condition: This patient's condition remains as documented in the consult dated 07/09/13, in which the Rehabilitation Physician determined and documented that the patient's condition is appropriate for intensive rehabilitative care in an inpatient rehabilitation facility. Will admit to inpatient rehab today.   Preadmission Screen Completed By:  Juliann Mule, PT 07/09/2013 2:39 PM ______________________________________________________________________    Discussed status  with Dr. Wynn Banker on 07/09/13 at 1500 and received telephone approval for admission today.   Admission Coordinator:  Juliann Mule, PT time 1500/Date 07/09/13          Cosigned by: Erick Colace, MD    [07/09/2013 3:19 PM]  Revision History...     Date/Time User Action   07/09/2013 3:19 PM Erick Colace, MD Cosign   07/09/2013 3:00 PM Janine L [name] Sign   07/09/2013 2:43 PM Janine L [name] Share   07/09/2013 2:13 PM Janine L [name] Share   07/09/2013 12:33 PM Trish Mage, RN Share   07/09/2013 12:32 PM Trish Mage, RN Share  View Details Report

## 2013-07-09 NOTE — Consult Note (Signed)
[facility] Face-to-Face Psychiatry Consult   Reason for Consult:  Capacity evaluation Referring Physician:  Dr. Cheyenne Adas is an 43 y.o. female. Total Time spent with patient: 45 minutes  Assessment: AXIS I:  Mood Disorder NOS secondary to [place] (Multiple Sclerosis) AXIS II:  Deferred AXIS III:   Past Medical History  Diagnosis Date  . Multiple sclerosis   . HYPERTENSION 09/30/2006  . DIABETES MELLITUS, TYPE II 09/30/2006  . TIA (transient ischemic attack)   . TOBACCO USER 09/30/2006    Qualifier: Diagnosis of  By: Amil Amen MD, Benjamine Mola    . Multiple sclerosis, relapsing-remitting 05/29/2012  . DYSLIPIDEMIA 09/30/2006    Qualifier: Diagnosis of  By: Amil Amen MD, Benjamine Mola    . DIABETES MELLITUS, TYPE II 09/30/2006    Qualifier: Diagnosis of  By: Amil Amen MD, Benjamine Mola    . BENIGN POSITIONAL VERTIGO 01/27/2010    Qualifier: Diagnosis of  By: Amil Amen MD, Benjamine Mola    . Stroke   . Neuromuscular disorder     hx of ms  . Depression    AXIS IV:  occupational problems, other psychosocial or environmental problems, problems related to social environment, problems with access to health care services and problems with primary support group AXIS V:  41-50 serious symptoms  Plan:  Patient does meet criteria for capacity to make her own medical decisions and living arrangement.  Recommend Wellbutrin XL 150 mg PO Qam for depression Patient does not meet criteria for psychiatric inpatient admission. Supportive therapy provided about ongoing stressors. Appreciate psychiatric consultation and follow up as clinically required Please contact [phone] if needs further assistance  Subjective:   Keylee SOPHI CALLIGAN is a 43 y.o. female patient admitted with frequent falls.  HPI: Patient is seen and chart reviewed. Psychiatric consultation requested for capacity evaluation. Patient was admitted to [facility] cone medical floor due to generalized, weakness, tired and being depressed about three days. Patient son  who is at bed side also provided information for this evaluation. Reportedly she has been diagnosed with MS since 2007 and has been deteriorated day to day activities about three years. She has no services from neurologist and has a scheduled appointment with [facility] clinic on coming Monday. Patient receiving steroid treatment while in the hospital. Patient and her son reported that she is also suffering with depression since her brother died about few years ago due to medical condition and their complication. Reportedly she was a taken care on several family members. 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 She has difficulty to express some of the words and become tearful and frustrated. She has denied SI/HI and psychosis. Patient has been isolated, less socializing and has mobility issue and using walker and bedside commode at home. She has no previous history of psychiatric treatment and no reported history of substance abuse. She has no family history of mental illness.   Interval history: Patient appeared sitting on a chair next to her bed. Patient reported she has taken antidepressant medication Wellbutrin XL 150 mg without adverse affects. Patient stated she is doing better but continued to have symptoms of depression. Patient stated she wants her son to come back to hospital. Patient son is very supportive to her. Patient has no suicide or homicide ideation or evidence of psychosis. Patient will be referred to the outpatient psychiatric services at the time of discharged from hospital.  Medical history: Kiyani BIRDIE FETTY is a 43 y.o. female with PMH of HTN, DM, MS presented with  frequent falls; Pt is not well ambulatory due to MS, but per her son she has been falling a lot, recently has R sided facial injury due to fall; Patient also reports having nausea, vomiting for 1 day,nmo diarrhea, no abd pain, no hematemesis;   Review of Systems: The patient denies anorexia, fever, weight  loss,, vision loss, decreased hearing, hoarseness, chest pain, syncope, dyspnea on exertion, peripheral edema, balance deficits, hemoptysis, abdominal pain, melena, hematochezia, severe indigestion/heartburn, hematuria, incontinence, genital sores, muscle weakness, suspicious skin lesions, transient blindness, difficulty walking, depression, unusual weight change, abnormal bleeding, enlarged lymph nodes, angioedema, and breast masses.   HPI Elements:   Location:  weakness and depression. Quality:  poor. Severity:  acute and chronic. Timing:  loss of family members and frequent falls due to moblity issues.  Past Psychiatric History: Past Medical History  Diagnosis Date  . Multiple sclerosis   . HYPERTENSION 09/30/2006  . DIABETES MELLITUS, TYPE II 09/30/2006  . TIA (transient ischemic attack)   . TOBACCO USER 09/30/2006    Qualifier: Diagnosis of  By: Amil Amen MD, Benjamine Mola    . Multiple sclerosis, relapsing-remitting 05/29/2012  . DYSLIPIDEMIA 09/30/2006    Qualifier: Diagnosis of  By: Amil Amen MD, Benjamine Mola    . DIABETES MELLITUS, TYPE II 09/30/2006    Qualifier: Diagnosis of  By: Amil Amen MD, Benjamine Mola    . BENIGN POSITIONAL VERTIGO 01/27/2010    Qualifier: Diagnosis of  By: Amil Amen MD, Benjamine Mola    . Stroke   . Neuromuscular disorder     hx of ms  . Depression     reports that she quit smoking about 2 years ago. She has never used smokeless tobacco. She reports that she does not drink alcohol or use illicit drugs. Family History  Problem Relation Age of Onset  . Hypertension Mother   . Kidney failure Mother   . Hypertension Father   . Gout Father   . Hypertension Sister   . Diabetes Sister   . Hypertension Brother      Living Arrangements: Children;Other relatives   Abuse/Neglect [facility]) Physical Abuse: Denies Verbal Abuse: Denies Sexual Abuse: Denies Allergies:  No Known Allergies  ACT Assessment Complete:  no Objective: Blood pressure 145/87, pulse 72, temperature 97.7 F  (36.5 C), temperature source Oral, resp. rate 17, height 5' 1" (1.549 m), weight 60 kg (132 lb 4.4 oz), last menstrual period 08/23/2012, SpO2 100.00%.Body mass index is 25.01 kg/(m^2). Results for orders placed during the hospital encounter of 07/03/13 (from the past 72 hour(s))  GLUCOSE, CAPILLARY     Status: Abnormal   Collection Time    07/06/13 12:55 PM      Result Value Ref Range   Glucose-Capillary 127 (*) 70 - 99 mg/dL  GLUCOSE, CAPILLARY     Status: Abnormal   Collection Time    07/06/13  4:40 PM      Result Value Ref Range   Glucose-Capillary 250 (*) 70 - 99 mg/dL  GLUCOSE, CAPILLARY     Status: Abnormal   Collection Time    07/06/13  9:30 PM      Result Value Ref Range   Glucose-Capillary 171 (*) 70 - 99 mg/dL   Comment 1 Notify RN    BASIC METABOLIC PANEL     Status: Abnormal   Collection Time    07/07/13  5:20 AM      Result Value Ref Range   Sodium 141  137 - 147 mEq/L   Potassium 3.6 (*) 3.7 - 5.3 mEq/L  Chloride 105  96 - 112 mEq/L   CO2 23  19 - 32 mEq/L   Glucose, Bld 160 (*) 70 - 99 mg/dL   BUN 13  6 - 23 mg/dL   Creatinine, Ser 0.52  0.50 - 1.10 mg/dL   Calcium 9.1  8.4 - 10.5 mg/dL   GFR calc non Af Amer >90  >90 mL/min   GFR calc Af Amer >90  >90 mL/min   Comment: (NOTE)     The eGFR has been calculated using the CKD EPI equation.     This calculation has not been validated in all clinical situations.     eGFR's persistently <90 mL/min signify possible Chronic Kidney     Disease.  GLUCOSE, CAPILLARY     Status: Abnormal   Collection Time    07/07/13  7:57 AM      Result Value Ref Range   Glucose-Capillary 141 (*) 70 - 99 mg/dL  GLUCOSE, CAPILLARY     Status: Abnormal   Collection Time    07/07/13 11:50 AM      Result Value Ref Range   Glucose-Capillary 207 (*) 70 - 99 mg/dL   Comment 1 Notify RN    GLUCOSE, CAPILLARY     Status: Abnormal   Collection Time    07/07/13  5:32 PM      Result Value Ref Range   Glucose-Capillary 191 (*) 70 - 99  mg/dL   Comment 1 Notify RN    GLUCOSE, CAPILLARY     Status: Abnormal   Collection Time    07/07/13 10:27 PM      Result Value Ref Range   Glucose-Capillary 196 (*) 70 - 99 mg/dL  GLUCOSE, CAPILLARY     Status: Abnormal   Collection Time    07/08/13  7:41 AM      Result Value Ref Range   Glucose-Capillary 126 (*) 70 - 99 mg/dL  GLUCOSE, CAPILLARY     Status: Abnormal   Collection Time    07/08/13 12:14 PM      Result Value Ref Range   Glucose-Capillary 170 (*) 70 - 99 mg/dL   Comment 1 Notify RN    GLUCOSE, CAPILLARY     Status: Abnormal   Collection Time    07/08/13  6:01 PM      Result Value Ref Range   Glucose-Capillary 191 (*) 70 - 99 mg/dL   Comment 1 Notify RN    GLUCOSE, CAPILLARY     Status: Abnormal   Collection Time    07/08/13  9:34 PM      Result Value Ref Range   Glucose-Capillary 190 (*) 70 - 99 mg/dL  GLUCOSE, CAPILLARY     Status: Abnormal   Collection Time    07/09/13  8:27 AM      Result Value Ref Range   Glucose-Capillary 127 (*) 70 - 99 mg/dL   Labs are reviewed and are pertinent for hyperglycemia.  Current Facility-Administered Medications  Medication Dose Route Frequency Provider Last Rate Last Dose  . acetaminophen (TYLENOL) tablet 650 mg  650 mg Oral Q6H PRN Kinnie Feil, MD       Or  . acetaminophen (TYLENOL) suppository 650 mg  650 mg Rectal Q6H PRN Kinnie Feil, MD      . amLODipine (NORVASC) tablet 5 mg  5 mg Oral Daily Orson Eva, MD   5 mg at 07/08/13 0949  . aspirin EC tablet 81 mg  81 mg Oral Daily Ulugbek  Wynonia Lawman, MD   81 mg at 07/08/13 0949  . buPROPion (WELLBUTRIN XL) 24 hr tablet 150 mg  150 mg Oral Daily Durward Parcel, MD   150 mg at 07/08/13 1903  . enoxaparin (LOVENOX) injection 40 mg  40 mg Subcutaneous Q24H Kinnie Feil, MD   40 mg at 07/08/13 1857  . insulin aspart (novoLOG) injection 0-9 Units  0-9 Units Subcutaneous TID WC Orson Eva, MD   1 Units at 07/09/13 0854  . meclizine (ANTIVERT) tablet 25 mg   25 mg Oral TID PRN Kinnie Feil, MD   25 mg at 07/07/13 1017  . metoprolol tartrate (LOPRESSOR) tablet 25 mg  25 mg Oral BID Kinnie Feil, MD   25 mg at 07/08/13 2214  . ondansetron (ZOFRAN) tablet 4 mg  4 mg Oral Q6H PRN Kinnie Feil, MD       Or  . ondansetron (ZOFRAN) injection 4 mg  4 mg Intravenous Q6H PRN Kinnie Feil, MD      . potassium chloride SA (K-DUR,KLOR-CON) CR tablet 20 mEq  20 mEq Oral Daily Orson Eva, MD   20 mEq at 07/08/13 0949    Psychiatric Specialty Exam: Physical Exam  ROS  Blood pressure 145/87, pulse 72, temperature 97.7 F (36.5 C), temperature source Oral, resp. rate 17, height 5' 1" (1.549 m), weight 60 kg (132 lb 4.4 oz), last menstrual period 08/23/2012, SpO2 100.00%.Body mass index is 25.01 kg/(m^2).  General Appearance: Guarded  Eye Contact::  Fair  Speech:  Clear and Coherent and Slow  Volume:  Decreased  Mood:  Anxious, Depressed, Dysphoric, Hopeless and Worthless  Affect:  Tearful  Thought Process:  Coherent and Goal Directed  Orientation:  Full (Time, Place, and Person)  Thought Content:  WDL  Suicidal Thoughts:  No  Homicidal Thoughts:  No  Memory:  Immediate;   Good  Judgement:  Fair  Insight:  Good  Psychomotor Activity:  Psychomotor Retardation  Concentration:  Fair  Recall:  [place] of Knowledge:Fair  Language: Good  Akathisia:  NA  Handed:  Right  AIMS (if indicated):     Assets:  Communication Skills Desire for Improvement Housing Intimacy Leisure Time Resilience Social Support Talents/Skills Transportation  Sleep:      Musculoskeletal: Strength & Muscle Tone: within normal limits Gait & Station: normal Patient leans: N/A  Treatment Plan Summary: Daily contact with patient to assess and evaluate symptoms and progress in treatment Medication management  ,JANARDHAHA R. 07/09/2013 9:57 AM

## 2013-07-09 NOTE — Progress Notes (Signed)
Rehab admissions - I met with pt to explain the possibility of inpatient rehab. Questions were answered and informational brochures were given. Pt was pleasant and had slower processing overall but did answer my questions. Then, pt's son called into pt's room and I shared with him about inpatient rehab. Both pt and son are interested in pursuing this. Son stated that he does not work and can be with pt 24-7. Son and his fiance live with pt in apartment and will offer needed support for pt.  I then spoke with Dr. Wynelle Cleveland who stated that pt is medically ready for CIR. Bed is available and will admit pt to inpatient rehab later today. I updated Heather with care management of plan for CIR. Nira Conn will update Raquel Sarna, Education officer, museum.  Please call me with any questions. Thanks.  Nanetta Batty, PT Rehabilitation Admissions Coordinator [phone]

## 2013-07-09 NOTE — Progress Notes (Signed)
Patient ID: [name] [name], female   DOB: 10/11/1970, 43 y.o.   MRN: [id number] Patient admitted to 4MW08 via bed, escorted by nursing staff.  Patient verbalized understanding of rehab process, no questions at this time.  Safety plan explained to patient, patient in agreement.  Will continue to monitor.  Dani Gobble, RN

## 2013-07-09 NOTE — Progress Notes (Signed)
Ranelle OysterZachary T Swartz, MD Physician Signed Physical Medicine and Rehabilitation Consult Note Service date: 07/09/2013 6:06 AM  Related encounter: [facility] (Dis[facility]           Physical Medicine and Rehabilitation Consult Reason for Consult: MS exacerbation Referring Physician: [facility]ad     HPI: [name] [name] is a 43 y.o. right-handed female with history of hypertension, diabetes mellitus with peripheral neuropathy as well as multiple sclerosis diagnosed 2007. Patient lives with her son and used a [employer] workercane/walker prior to admission. Presented 07/03/2013 with increasing weakness over the last 3-4 days as well as mild nausea with vomiting. There was report of a witnessed fall by her son striking her face without loss of consciousness. Cranial CT scan with no acute intracranial abnormalities. Severe changes of small vessel disease of the white matter consistent with multiple sclerosis. Placed on intravenous Solu-Medrol x5 days for suspect MS exacerbation completed 07/08/2013. Psychiatry was consulted in relation to depression placed on Wellbutrin. Subcutaneous Lovenox for DVT prophylaxis. Maintained on a regular consistency diet. Physical therapy evaluation completed an ongoing with recommendations of physical medicine rehabilitation consult.     Review of Systems  Gastrointestinal: Positive for constipation.  Musculoskeletal: Positive for falls and myalgias.  Neurological: Positive for dizziness and weakness.  Psychiatric/Behavioral: Positive for depression.  All other systems reviewed and are negative. Past Medical History   Diagnosis  Date   .  Multiple sclerosis     .  HYPERTENSION  09/30/2006   .  DIABETES MELLITUS, TYPE II  09/30/2006   .  TIA (transient ischemic attack)     .  TOBACCO USER  09/30/2006       Qualifier: Diagnosis of  By: Delrae AlfredMulberry MD, [name]     .  Multiple sclerosis, relapsing-remitting  05/29/2012   .   DYSLIPIDEMIA  09/30/2006       Qualifier: Diagnosis of  By: Delrae AlfredMulberry MD, [name]     .  DIABETES MELLITUS, TYPE II  09/30/2006       Qualifier: Diagnosis of  By: Delrae AlfredMulberry MD, [name]     .  BENIGN POSITIONAL VERTIGO  01/27/2010       Qualifier: Diagnosis of  By: Delrae AlfredMulberry MD, [name]     .  Stroke     .  Neuromuscular disorder         hx of ms   .  Depression      History reviewed. No pertinent past surgical history. Family History   Problem  Relation  Age of Onset   .  Hypertension  Mother     .  Kidney failure  Mother     .  Hypertension  Father     .  Gout  Father     .  Hypertension  Sister     .  Diabetes  Sister     .  Hypertension  Brother      Social History: reports that she quit smoking about 2 years ago. She has never used smokeless tobacco. She reports that she does not drink alcohol or use illicit drugs. Allergies: No Known Allergies Medications Prior to Admission   Medication  Sig  Dispense  Refill   .  amLODipine (NORVASC) 10 MG tablet  Take 1 tablet (10 mg total) by mouth daily.   30 tablet   0   .  aspirin EC 81 MG tablet  Take 81 mg by mouth daily.         .Marland Kitchen  meclizine (ANTIVERT) 25 MG tablet  Take 1 tablet (25 mg total) by mouth 3 (three) times daily as needed for dizziness or nausea.   90 tablet   11   .  metFORMIN (GLUCOPHAGE) 500 MG tablet  Take 1 tablet (500 mg total) by mouth daily with breakfast.   60 tablet   0   .  metoprolol tartrate (LOPRESSOR) 25 MG tablet  Take 25 mg by mouth 2 (two) times daily.         .  potassium chloride SA (K-DUR,KLOR-CON) 20 MEQ tablet  Take 1 tablet (20 mEq total) by mouth daily.   30 tablet   0      Home: Home Living Family/patient expects to be discharged to:: Private residence Living Arrangements: Children;Other relatives Available Help at Discharge: Family;Available 24 hours/day Type of Home: Apartment Home Access: Level entry Home Layout: One level Home Equipment: Walker - 2 wheels;Cane - single point Additional  Comments: Pt poor historian, appears confused. No family present. Attempted to contact son Olga Millers") without success.   Functional History: Prior Function Level of Independence: Needs assistance Gait / Transfers Assistance Needed: per pt, uses RW at home to get around. Pt states "I want a wheelchair so I can go further." ADL's / Homemaking Assistance Needed: per pt report, son and his girlfriend assist with bathing and dressing (LB) and IADLs.   Communication / Swallowing Assistance Needed: Pt with noted slurred speech, slow to respond to questions. ADmitted w/ AMS & no family present to verify. Comments: pt reports falling several times when asked and states that her son helps her up. Unable to confirm with family.  Functional Status:   Mobility: Bed Mobility Overal bed mobility: Needs Assistance Bed Mobility: Supine to Sit Rolling: Min assist Sidelying to sit: Min assist Supine to sit: Min assist Sit to supine: Min guard General bed mobility comments: cues for sequencing and initiation; minimal truncal assist to come to sit with assist of rail Transfers Overall transfer level: Needs assistance Equipment used: Rolling walker (2 wheeled);None Transfers: Sit to/from [employer] to Stand: Min assist;+2 physical assistance Stand pivot transfers: Mod assist;+2 safety/equipment (times 5) General transfer comment: cues for hand placement; assist to boost confidence and to help come forward. Ambulation/Gait Ambulation/Gait assistance: Mod assist;+2 physical assistance Ambulation Distance (Feet):  (marched in place 3 steps before pt wanted to sit.) Assistive device: Rolling walker (2 wheeled) Gait Pattern/deviations: Step-to pattern;Decreased step length - right;Decreased step length - left;Narrow base of support;Shuffle General Gait Details: Short steps with max encouragement required to attempt ambulation   ADL: ADL Overall ADL's : Needs  assistance/impaired Eating/Feeding: Set up;Sitting Eating/Feeding Details (indicate cue type and reason): Pt able to feed self without spills Grooming: Set up;Sitting Upper Body Bathing: Sitting;Moderate assistance;Cueing for sequencing Lower Body Bathing: Moderate assistance;Sit to/from stand;Cueing for sequencing Upper Body Dressing : Moderate assistance;Sitting Lower Body Dressing: Maximal assistance;Sit to/from stand;Cueing for sequencing Toilet Transfer: Minimal assistance;Stand-pivot;RW;BSC Toilet Transfer Details (indicate cue type and reason): Requires min a for balance from [facility] to recliner.    Toileting- Clothing Manipulation and Hygiene: Total assistance;Sit to/from stand Toileting - Clothing Manipulation Details (indicate cue type and reason): pt propped RLE on bed and leaned back on [facility] for toilet hygiene.VC's for safety.   Tub/ Shower Transfer: Total assistance Functional mobility during ADLs:  (did not assess this date) General ADL Comments: Pt reluctant to work with therapy, however after bedside evaluation pt requested to use bathroom. Pt asked to use bedpan,  however encouraged pt to exercise Bil LEs through standing and transferring to [facility]. Pt performed transfer with Min A and bearing weight through both legs to stand fully upright. Pt demonstrates slow processing and difficult to determine cognitive status with unknown baseline.    Cognition: Cognition Overall Cognitive Status: No family/caregiver present to determine baseline cognitive functioning Orientation Level: Oriented to person;Oriented to place;Disoriented to time;Disoriented to situation Cognition Arousal/Alertness: Awake/alert Behavior During Therapy: Flat affect Overall Cognitive Status: No family/caregiver present to determine baseline cognitive functioning Area of Impairment: Attention;Safety/judgement;Awareness;Problem solving;Memory Orientation Level: Disoriented to;Place;Time (pt states she is here because  "I fell") Current Attention Level: Sustained Memory: Decreased short-term memory Following Commands: Follows one step commands with increased time Safety/Judgement: Decreased awareness of safety;Decreased awareness of deficits Awareness: Intellectual Problem Solving: Slow processing;Decreased initiation;Difficulty sequencing;Requires verbal cues;Requires tactile cues General Comments: Difficult to determine cognitive status due to unknown baseline. Attempted to call pt's son with no success.    Blood pressure 145/87, pulse 72, temperature 97.7 F (36.5 C), temperature source Oral, resp. rate 17, height 5\' 1"  (1.549 m), weight 60 kg (132 lb 4.4 oz), last menstrual period 08/23/2012, SpO2 100.00%. Physical Exam  Constitutional:  42 year old [place] American female appearing older than stated age.  HENT:   Head: Normocephalic.  Eyes: EOM are normal.  Neck: Normal range of motion. Neck supple. No thyromegaly present.  Cardiovascular: Normal rate and regular rhythm.   Respiratory: Effort normal and breath sounds normal. No respiratory distress.  GI: Soft. Bowel sounds are normal. She exhibits no distension.  Neurological: She is alert.  Mood is flat. She is a poor historian. She is able to provide as name, date of birth in place. She follows simple commands. Vision impaired. Very delayed processing. Moves all 4's but inconsistently.   Skin: Skin is warm and dry.  Psychiatric:  Flat, tearful.     Results for orders placed during the hospital encounter of 07/03/13 (from the past 24 hour(s))   GLUCOSE, CAPILLARY     Status: Abnormal     Collection Time      07/08/13  7:41 AM       Result  Value  Ref Range     Glucose-Capillary  126 (*)  70 - 99 mg/dL   GLUCOSE, CAPILLARY     Status: Abnormal     Collection Time      07/08/13 12:14 PM       Result  Value  Ref Range     Glucose-Capillary  170 (*)  70 - 99 mg/dL     Comment 1  Notify RN      GLUCOSE, CAPILLARY     Status: Abnormal      Collection Time      07/08/13  6:01 PM       Result  Value  Ref Range     Glucose-Capillary  191 (*)  70 - 99 mg/dL     Comment 1  Notify RN      GLUCOSE, CAPILLARY     Status: Abnormal     Collection Time      07/08/13  9:34 PM       Result  Value  Ref Range     Glucose-Capillary  190 (*)  70 - 99 mg/dL    No results found.   Assessment/Plan: Diagnosis: MS exacerbation Does the need for close, 24 hr/day medical supervision in concert with the patient's rehab needs make it unreasonable for this patient to be served  in a less intensive setting? Yes and Potentially Co-Morbidities requiring supervision/potential complications: htn, dm Due to bladder management, bowel management, safety, skin/wound care, disease management, medication administration, pain management and patient education, does the patient require 24 hr/day rehab nursing? Potentially Does the patient require coordinated care of a physician, rehab nurse, PT (1-2 hrs/day, 5 days/week), OT (1-2 hrs/day, 5 days/week) and SLP (1-2 hrs/day, 5 days/week) to address physical and functional deficits in the context of the above medical diagnosis(es)? Yes Addressing deficits in the following areas: balance, endurance, locomotion, strength, transferring, bowel/bladder control, bathing, dressing, feeding, grooming, toileting, cognition, speech, language, swallowing and psychosocial support Can the patient actively participate in an intensive therapy program of at least 3 hrs of therapy per day at least 5 days per week? Yes and Potentially The potential for patient to make measurable gains while on inpatient rehab is good and fair Anticipated functional outcomes upon discharge from inpatient rehab are supervision with PT, supervision and min assist with OT, supervision and min assist with SLP. Estimated rehab length of stay to reach the above functional goals is: ?12-16 days Does the patient have adequate social supports to accommodate these  discharge functional goals? Potentially Anticipated D/C setting: Home Anticipated post D/C treatments: HH therapy and Outpatient therapy Overall Rehab/Functional Prognosis: good and fair   RECOMMENDATIONS: This patient's condition is appropriate for continued rehabilitative care in the following setting: see below Patient has agreed to participate in recommended program. N/A Note that insurance prior authorization may be required for reimbursement for recommended care.   Comment: Pt very delayed. Minimal participation in my exam or questioning. I am unclear of her premorbid functional status. Can son provide for projected care needs once inpatient rehab completed. Rehab Admissions Coordinator to follow up.   Thanks,   Ranelle Oyster, MD, [place] Dom         07/09/2013    Revision History...     Date/Time User Action   07/09/2013 9:48 AM Ranelle Oyster, MD Sign   07/09/2013 6:37 AM Charlton Amor, PA-C Pend  View Details Report   Routing History...     Date/Time From To Method   07/09/2013 9:48 AM Ranelle Oyster, MD Ranelle Oyster, MD In Basket   07/09/2013 9:48 AM Ranelle Oyster, MD Jeanann Lewandowsky, MD In Basket

## 2013-07-09 NOTE — Progress Notes (Signed)
Rehab Admissions - I met with pt and her son to complete admission paperwork and further questions were answered. Pt became a bit labile and son was very encouraging to his mother. They both are glad she can come to rehab to get stronger for home.  Pt will be admitted to inpatient rehab later this afternoon. Please call me with any questions. Thanks.  Nanetta Batty, PT Rehabilitation Admissions Coordinator [phone]

## 2013-07-10 ENCOUNTER — Inpatient Hospital Stay (HOSPITAL_COMMUNITY): Payer: Medicare Other | Admitting: *Deleted

## 2013-07-10 ENCOUNTER — Inpatient Hospital Stay (HOSPITAL_COMMUNITY): Payer: Self-pay | Admitting: Physical Therapy

## 2013-07-10 ENCOUNTER — Inpatient Hospital Stay (HOSPITAL_COMMUNITY): Payer: Self-pay | Admitting: Speech Pathology

## 2013-07-10 DIAGNOSIS — G35 Multiple sclerosis: Secondary | ICD-10-CM

## 2013-07-10 DIAGNOSIS — G8250 Quadriplegia, unspecified: Secondary | ICD-10-CM

## 2013-07-10 LAB — CBC WITH DIFFERENTIAL/PLATELET
BASOS PCT: 0 % (ref 0–1)
Basophils Absolute: 0 10*3/uL (ref 0.0–0.1)
EOS PCT: 0 % (ref 0–5)
Eosinophils Absolute: 0 10*3/uL (ref 0.0–0.7)
HCT: 40.4 % (ref 36.0–46.0)
Hemoglobin: 14.3 g/dL (ref 12.0–15.0)
Lymphocytes Relative: 28 % (ref 12–46)
Lymphs Abs: 3 10*3/uL (ref 0.7–4.0)
MCH: 32.1 pg (ref 26.0–34.0)
MCHC: 35.4 g/dL (ref 30.0–36.0)
MCV: 90.8 fL (ref 78.0–100.0)
Monocytes Absolute: 1.3 10*3/uL — ABNORMAL HIGH (ref 0.1–1.0)
Monocytes Relative: 12 % (ref 3–12)
NEUTROS PCT: 60 % (ref 43–77)
Neutro Abs: 6.3 10*3/uL (ref 1.7–7.7)
PLATELETS: 269 10*3/uL (ref 150–400)
RBC: 4.45 MIL/uL (ref 3.87–5.11)
RDW: 12.2 % (ref 11.5–15.5)
WBC: 10.6 10*3/uL — ABNORMAL HIGH (ref 4.0–10.5)

## 2013-07-10 LAB — COMPREHENSIVE METABOLIC PANEL
ALBUMIN: 3.4 g/dL — AB (ref 3.5–5.2)
ALK PHOS: 78 U/L (ref 39–117)
ALT: 23 U/L (ref 0–35)
AST: 14 U/L (ref 0–37)
BUN: 15 mg/dL (ref 6–23)
CALCIUM: 8.7 mg/dL (ref 8.4–10.5)
CO2: 27 mEq/L (ref 19–32)
Chloride: 104 mEq/L (ref 96–112)
Creatinine, Ser: 0.6 mg/dL (ref 0.50–1.10)
GFR calc non Af Amer: >90 mL/min (ref >90)
GLUCOSE: 100 mg/dL — AB (ref 70–99)
POTASSIUM: 3.7 meq/L (ref 3.7–5.3)
SODIUM: 142 meq/L (ref 137–147)
TOTAL PROTEIN: 6.4 g/dL (ref 6.0–8.3)
Total Bilirubin: 1.2 mg/dL (ref 0.3–1.2)

## 2013-07-10 LAB — GLUCOSE, CAPILLARY
GLUCOSE-CAPILLARY: 92 mg/dL (ref 70–99)
GLUCOSE-CAPILLARY: 143 mg/dL — AB (ref 70–99)
Glucose-Capillary: 108 mg/dL — ABNORMAL HIGH (ref 70–99)

## 2013-07-10 NOTE — Evaluation (Signed)
Speech Language Pathology Assessment and Plan  Patient Details  Name: [name] [name] MRN: [id number] Date of Birth: 03-13-1970  SLP Diagnosis: Dysarthria;Cognitive Impairments  Rehab Potential: Good ELOS: 14-21 days   Today's Date: 07/10/2013 Time: 4967-5916 Time Calculation (min): 64 min  Problem List:  Patient Active Problem List   Diagnosis Date Noted  . Multiple sclerosis exacerbation 07/03/2013  . Sepsis 06/08/2013  . Aspiration pneumonia 06/03/2013  . Depression 07/21/2012  . Constipation 06/03/2012  . Cutaneous abscess of buttock 06/03/2012  . TIA (transient ischemic attack) 05/31/2012  . Multiple sclerosis, relapsing-remitting 05/29/2012  . Right leg weakness 05/28/2012  . Fall 05/28/2012  . Leukocytosis 05/28/2012  . Hypokalemia 05/28/2012  . BENIGN POSITIONAL VERTIGO 01/27/2010  . CARPAL TUNNEL SYNDROME, LEFT 04/14/2009  . SHOULDER STRAIN, LEFT 03/03/2009  . DIABETES MELLITUS, TYPE II 09/30/2006  . DYSLIPIDEMIA 09/30/2006  . TOBACCO USER 09/30/2006  . HYPERTENSION 09/30/2006  . Multiple sclerosis 12/05/2005   Past Medical History:  Past Medical History  Diagnosis Date  . Multiple sclerosis   . HYPERTENSION 09/30/2006  . DIABETES MELLITUS, TYPE II 09/30/2006  . TIA (transient ischemic attack)   . TOBACCO USER 09/30/2006    Qualifier: Diagnosis of  By: Amil Amen MD, Benjamine Mola    . Multiple sclerosis, relapsing-remitting 05/29/2012  . DYSLIPIDEMIA 09/30/2006    Qualifier: Diagnosis of  By: Amil Amen MD, Benjamine Mola    . DIABETES MELLITUS, TYPE II 09/30/2006    Qualifier: Diagnosis of  By: Amil Amen MD, Benjamine Mola    . BENIGN POSITIONAL VERTIGO 01/27/2010    Qualifier: Diagnosis of  By: Amil Amen MD, Benjamine Mola    . Stroke   . Neuromuscular disorder     hx of ms  . Depression    Past Surgical History: No past surgical history on file.  Assessment / Plan / Recommendation Clinical Impression   [name] [name] is a 43 y.o. right-handed female with history of  hypertension, diabetes mellitus with peripheral neuropathy as well as multiple sclerosis diagnosed 2007. Patient lives with her son and used a [employer] prior to admission. Presented 07/03/2013 with increasing weakness over the last 3-4 days as well as mild nausea with vomiting. There was report of a witnessed fall by her son striking her face without loss of consciousness. Cranial CT scan with no acute intracranial abnormalities. Severe changes of small vessel disease of the white matter consistent with multiple sclerosis. Placed on intravenous Solu-Medrol x5 days for suspect MS exacerbation completed 07/08/2013. Psychiatry was consulted in relation to depression placed on Wellbutrin. Maintained on a regular consistency diet. Physical therapy evaluation completed an ongoing with recommendations of physical medicine rehabilitation consult. Patient was admitted for comprehensive rehabilitation program on 07/09/2013.  Speech language eval and bedside swallowing eval completed 07/10/13 with the following results.  Pt presents with grossly intact oropharyngeal swallowing function with no overt s/s of aspiration across any consistencies assessed and adequate oral manipulation and clearance of solids. However, pt has recent history of aspiration pneumonia (06/02/13) and would benefit from ongoing monitoring of diet tolerance in the setting of fatigue/weakness related to MS exacerbations.  Additionally, pt presents with mild dysarthria due to generalized oral motor weakness which resulted in imprecise articulation of consonants and decreased volume during conversations.  Pt also presents with moderate cognitive impairments for short term and working memory, executive function for sequencing, organization, and error awareness, as well as decreased selective attention.  Suspect that pt's cognitive impairments are not significantly altered from her baseline given report that her son  managed her medication, finances, and  household tasks, although pt is a poor historian due to memory impairments.  Furthermore, pt presents with significantly delayed processing which is likely precipitating her clinical presentation for acute cognitive changes.  Pt would benefit from skilled speech therapy while inpatient to improve cognitive-linguistic function and monitor swallowing safety in order to maximize functional independence and reduce burden of care upon discharge.      Skilled Therapeutic Interventions          Cognitive-linguistic evaluation completed with results and recommendations reviewed with family.     SLP Assessment  Patient will need skilled Speech Lanaguage Pathology Services during CIR admission    Recommendations  Diet Recommendations: Regular;Thin liquid Liquid Administration via: Cup;Straw Medication Administration: Whole meds with liquid Supervision: Patient able to self feed Compensations: Slow rate;Small sips/bites Postural Changes and/or Swallow Maneuvers: Seated upright 90 degrees;Upright 30-60 min after meal;Out of bed for meals Oral Care Recommendations: Oral care BID Recommendations for Other Services: Neuropsych consult Patient destination: Home Follow up Recommendations: Home Health SLP Equipment Recommended: None recommended by SLP    SLP Frequency 5 out of 7 days   SLP Treatment/Interventions Cognitive remediation/compensation;Cueing hierarchy;Patient/family education;Internal/external aids;Oral motor exercises;Dysphagia/aspiration precaution training    Pain Pain Assessment Pain Assessment: No/denies pain Prior Functioning Cognitive/Linguistic Baseline: Baseline deficits Baseline deficit details: pt's son managed pt's medications and finances and performed most household duties including cooking, cleaning, and grocery shopping; therefore suspect baseline impairments although pt is a poor historian for baseline information   Type of Home: Apartment  Lives With: Family Available Help  at Discharge: Family;Available 24 hours/day Vocation: Unemployed  Short Term Goals: Week 1: SLP Short Term Goal 1 (Week 1): Pt will improve short term memory for recall of daily events and information for 75% accuracy with mod assist  SLP Short Term Goal 2 (Week 1): Pt will improve executive function for sequencing, organization, and error awareness during basic tasks for 75% accuracy with mod assist.  SLP Short Term Goal 3 (Week 1): Pt will tolerate her currently prescribed diet with no overt s/s of aspiration with modified independence  SLP Short Term Goal 4 (Week 1): Pt will selectively attend to functional tasks in a minimally distracting environment for 7-10 minutes with min cuing for redirection.   SLP Short Term Goal 5 (Week 1): Pt will improve her speech intelligibility to 80% accurate at the conversational level with min cuing for use of compensatory strategies.    See FIM for current functional status Refer to Care Plan for Long Term Goals  Recommendations for other services: Neuropsych  Discharge Criteria: Patient will be discharged from SLP if patient refuses treatment 3 consecutive times without medical reason, if treatment goals not met, if there is a change in medical status, if patient makes no progress towards goals or if patient is discharged from hospital.  The above assessment, treatment plan, treatment alternatives and goals were discussed and mutually agreed upon: by patient  Windell Moulding, M.A. CCC-SLP  Page, [name] 07/10/2013, 10:13 AM

## 2013-07-10 NOTE — Evaluation (Addendum)
Occupational Therapy Assessment and Plan  Patient Details  Name: [name] [name] MRN: [id number] Date of Birth: Feb 08, 1970  OT Diagnosis: abnormal posture, cognitive deficits and disturbance of vision Rehab Potential: Rehab Potential: Fair ELOS: 17-20 days   Today's Date: 07/10/2013 Time: 1100-1200  1st session Time Calculation (min): 60 min  Problem List:  Patient Active Problem List   Diagnosis Date Noted  . Multiple sclerosis exacerbation 07/03/2013  . Sepsis 06/08/2013  . Aspiration pneumonia 06/03/2013  . Depression 07/21/2012  . Constipation 06/03/2012  . Cutaneous abscess of buttock 06/03/2012  . TIA (transient ischemic attack) 05/31/2012  . Multiple sclerosis, relapsing-remitting 05/29/2012  . Right leg weakness 05/28/2012  . Fall 05/28/2012  . Leukocytosis 05/28/2012  . Hypokalemia 05/28/2012  . BENIGN POSITIONAL VERTIGO 01/27/2010  . CARPAL TUNNEL SYNDROME, LEFT 04/14/2009  . SHOULDER STRAIN, LEFT 03/03/2009  . DIABETES MELLITUS, TYPE II 09/30/2006  . DYSLIPIDEMIA 09/30/2006  . TOBACCO USER 09/30/2006  . HYPERTENSION 09/30/2006  . Multiple sclerosis 12/05/2005    Past Medical History:  Past Medical History  Diagnosis Date  . Multiple sclerosis   . HYPERTENSION 09/30/2006  . DIABETES MELLITUS, TYPE II 09/30/2006  . TIA (transient ischemic attack)   . TOBACCO USER 09/30/2006    Qualifier: Diagnosis of  By: Amil Amen MD, Benjamine Mola    . Multiple sclerosis, relapsing-remitting 05/29/2012  . DYSLIPIDEMIA 09/30/2006    Qualifier: Diagnosis of  By: Amil Amen MD, Benjamine Mola    . DIABETES MELLITUS, TYPE II 09/30/2006    Qualifier: Diagnosis of  By: Amil Amen MD, Benjamine Mola    . BENIGN POSITIONAL VERTIGO 01/27/2010    Qualifier: Diagnosis of  By: Amil Amen MD, Benjamine Mola    . Stroke   . Neuromuscular disorder     hx of ms  . Depression    Past Surgical History: No past surgical history on file.  Assessment & Plan Clinical Impression: HPI: [name] [name] is a 43 y.o.  right-handed female with history of hypertension, diabetes mellitus with peripheral neuropathy as well as multiple sclerosis diagnosed 2007. Patient lives with her son and used a Programmer, multimedia prior to admission. Presented 07/03/2013 with increasing weakness over the last 3-4 days as well as mild nausea with vomiting. There was report of a witnessed fall by her son striking her face without loss of consciousness. Cranial CT scan with no acute intracranial abnormalities. Severe changes of small vessel disease of the white matter consistent with multiple sclerosis. Placed on intravenous Solu-Medrol x5 days for suspect MS exacerbation completed 07/08/2013. Psychiatry was consulted in relation to depression placed on Wellbutrin. Subcutaneous Lovenox for DVT prophylaxis. Maintained on a regular consistency diet. Pt admitted to CIR on 07/09/13 Patient currently requires total with basic self-care skills secondary to muscle weakness and muscle joint tightness, impaired timing and sequencing, unbalanced muscle activation, decreased coordination and decreased motor planning, decreased visual acuity and decreased visual motor skills, decreased motor planning and decreased initiation, decreased awareness, decreased problem solving, decreased safety awareness, decreased memory and delayed processing.  Prior to hospitalization, patient could complete BADL with  with supervision.  Patient will benefit from skilled intervention to decrease level of assist with basic self-care skills and increase independence with basic self-care skills prior to discharge home with care partner.  Anticipate patient will require 24 hour supervision and follow up home health.  OT - End of Session Activity Tolerance: Tolerates < 10 min activity with changes in vital signs Endurance Deficit: Yes OT Assessment Rehab Potential: Fair Barriers to Discharge: Other (comment) OT  Patient demonstrates impairments in the following area(s):  Balance;Behavior;Cognition;Endurance;Motor;Perception;Safety;Sensory;Vision OT Basic ADL's Functional Problem(s): Eating;Grooming;Bathing;Dressing;Toileting OT Transfers Functional Problem(s): Toilet OT Additional Impairment(s): Fuctional Use of Upper Extremity OT Plan OT Intensity: Minimum of 1-2 x/day, 45 to 90 minutes OT Frequency: 5 out of 7 days OT Duration/Estimated Length of Stay: 18-21 days OT Treatment/Interventions: Balance/vestibular training;Cognitive remediation/compensation;Discharge planning;DME/adaptive equipment instruction;Functional mobility training;Patient/family education;Psychosocial support;Self Care/advanced ADL retraining;Therapeutic Activities;Therapeutic Exercise;UE/LE Strength taining/ROM;UE/LE Coordination activities;Visual/perceptual remediation/compensation;Wheelchair propulsion/positioning OT Self Feeding Anticipated Outcome(s): mod I OT Basic Self-Care Anticipated Outcome(s): minimal assist OT Toileting Anticipated Outcome(s): minimal assist OT Bathroom Transfers Anticipated Outcome(s): minimal assist OT Recommendation Patient destination: Home Equipment Recommended: None recommended by OT   Skilled Therapeutic Intervention  Time:  1600-1700  (60 min) Pain:  None Individual session:  Pt. Lying in bed.sitting on bedpan   Pt. Refused to get out of bed.  She rolled to right and left with total assist.  Performed UE AROM, visual motor assessment.  LUE is sore from a fall.  Her ROM is limited in shoulder o 45 degrees on Left.  RUE is full ROM and strength is 4/5.  Coordination is slow and undershooting target.  Pt had mild nystagmus with o occulomotor exercises.  Pt. Demonstrated decreased initiation and memory throughout session.  Unable to recall OT's name after max verbal reminders.  Pt unalbe to read names on dry erase board.  Remained in bed with bed alarm on and call bell,phone within reach. \ OT Evaluation Precautions/Restrictions   Precautions Precautions: Fall Restrictions Weight Bearing Restrictions: No    Pain  Left shoulder pain 5/10 from fall    Home Living/Prior Functioning Home Living Living Arrangements: Children Available Help at Discharge: Family;Available 24 hours/day Type of Home: Apartment Home Access: Stairs to enter Entrance Stairs-Number of Steps: 13 Entrance Stairs-Rails: Right;Left Home Layout: One level Additional Comments: Per son, pt owns personal bedside commode and raised toilet seat. (Poor hisotrian; )  Lives With: Son;Other (Comment) (Son, Pleas Patricia, and his fiance, [employer]) IADL History Homemaking Responsibilities: No Current License: No Leisure and Hobbies: tv Prior Function Level of Independence: Requires assistive device for independence;Needs assistance with ADLs;Needs assistance with homemaking;Other (comment);Needs assistance with gait;Needs assistance with tranfers (Per son, pt required assistance to step into underwear and pants in morning) Driving: No Vocation: Unemployed Comments: Per son, owns personal rolling walker but no w/c. ADL   Vision/Perception  Vision- Assessment Eye Alignment: Within Functional Limits Additional Comments: minimal horiozontal nystagmus with OROM Perception Perception: Impaired Figure Ground: impaired Spatial Orientation: impaired Praxis Praxis: Impaired Praxis Impairment Details: Motor planning  Cognition Overall Cognitive Status: Impaired/Different from baseline Arousal/Alertness: Awake/alert Orientation Level: Oriented to person Attention: Sustained Sustained Attention: Appears intact Selective Attention: Impaired Selective Attention Impairment: Functional basic Alternating Attention: Impaired Alternating Attention Impairment: Verbal basic;Functional basic Memory: Impaired Memory Impairment: Storage deficit;Retrieval deficit;Decreased short term memory;Decreased recall of new information Decreased Short Term Memory: Verbal  basic Awareness: Impaired Awareness Impairment: Intellectual impairment;Emergent impairment Problem Solving: Impaired Problem Solving Impairment: Functional basic Executive Function: Sequencing;Initiating;Organizing Sequencing: Impaired Sequencing Impairment: Functional basic Organizing: Impaired Organizing Impairment: Functional basic Initiating: Impaired Initiating Impairment: Verbal basic;Functional basic Behaviors: Perseveration;Lability Safety/Judgment: Impaired Sensation Sensation Light Touch: Appears Intact Proprioception: Impaired Detail Proprioception Impaired Details: Impaired LLE Additional Comments: Difficult to assess secondary to cognitive impairments. RLE proprioception appears grossly intact; L ankle/great toe impaired.  Coordination Gross Motor Movements are Fluid and Coordinated: No Fine Motor Movements are Fluid and Coordinated: No Finger Nose Finger Test: slow and undershooting with R >  L Heel Shin Test: Decreased smoothness and excurison on RLE. Unable to perform on LLE secondary to weakness. Motor  Motor Motor: Abnormal tone;Motor impersistence Motor - Skilled Clinical Observations:  (Pt.sitting in posterior pelvic tilt in wc) Mobility  Bed Mobility Bed Mobility: Rolling Right;Right Sidelying to Sit;Sitting - Scoot to Edge of Bed (Secondary to pt refusal to transfer from w/c>bed) Rolling Right: 3: Mod assist Rolling Right Details: Manual facilitation for weight shifting Right Sidelying to Sit: 2: Max assist Right Sidelying to Sit Details: Manual facilitation for placement;Manual facilitation for weight bearing;Verbal cues for safe use of DME/AE Sitting - Scoot to Edge of Bed: 2: Max assist Sitting - Scoot to [employer] of Bed Details: Manual facilitation for placement  Trunk/Postural Assessment  Cervical Assessment Cervical Assessment: Exceptions to [facility] Cervical AROM Overall Cervical AROM: Deficits Overall Cervical AROM Comments: decreased rotation to  left Thoracic Assessment Thoracic Assessment: Exceptions to [facility] Thoracic AROM Overall Thoracic AROM: Deficits Lumbar Assessment Lumbar Assessment: Exceptions to [facility] Lumbar Strength Overall Lumbar Strength: Deficits Postural Control Postural Control: Deficits on evaluation Trunk Control: While seated on toilet, pt with requent LOB to L side. Unable to correct significant lateral trunk lean to L despite multimodal cueing. Postural Limitations:  (sacral sitting)  Balance Balance Balance Assessed: Yes Static Sitting Balance Static Sitting - Balance Support: Feet supported;Bilateral upper extremity supported Static Sitting - Level of Assistance: 2: Max assist;3: Mod assist Static Sitting - Comment/# of Minutes: Seated on toilet x10 minutes with RUE at grab bar, L HHA; pt required mod-max A to maintain balance secondary to posterolateral (left-sided) pushing with trunk. Dynamic Sitting Balance Dynamic Sitting - Balance Support: Bilateral upper extremity supported;Feet supported Dynamic Sitting - Level of Assistance: 2: Max assist;1: +1 Total assist Dynamic Sitting - Balance Activities: Lateral lean/weight shifting Static Standing Balance Static Standing - Balance Support: During functional activity;Bilateral upper extremity supported Static Standing - Level of Assistance: 1: +1 Total assist Static Standing - Comment/# of Minutes: Static standing with bilat UE support at grab bars x30 seconds as nurse tech pulled up brief/pants, pt required Total A for standing balance. Extremity/Trunk Assessment RUE Assessment RUE Assessment: Exceptions to [facility] RUE Strength RUE Overall Strength Comments:  (4/5) LUE Assessment LUE Assessment: Exceptions to [facility] LUE AROM (degrees) Left Shoulder Flexion:  (2/5) LUE Strength Left Shoulder Flexion: 2/5  FIM:  FIM - Eating Eating Activity: 5: Set-up assist for open containers FIM - Grooming Grooming: 1: Patient completes 0 of 4 or 1 of 5 steps, or  requires 2 helpers FIM - Bathing Bathing: 1: Total-Patient completes 0-2 of 10 parts or less than 25% FIM - Upper Body Dressing/Undressing Upper body dressing/undressing: 0: Wears gown/pajamas-no public clothing FIM - Lower Body Dressing/Undressing Lower body dressing/undressing: 0: Wears Interior and spatial designer FIM - Toileting Toileting: 1: Two helpers FIM - Control and instrumentation engineer Devices: Bed rails;Arm rests Bed/Chair Transfer: 2: Supine > Sit: Max A (lifting assist/Pt. 25-49%);1: Two helpers FIM - Radio producer Devices: Product manager Transfers: 1-Two helpers FIM - Camera operator Transfers: 0-Activity did not occur or was simulated   Refer to Care Plan for Long Term Goals  Recommendations for other services: None  Discharge Criteria: Patient will be discharged from OT if patient refuses treatment 3 consecutive times without medical reason, if treatment goals not met, if there is a change in medical status, if patient makes no progress towards goals or if patient is discharged from hospital.  The above assessment, treatment  plan, treatment alternatives and goals were discussed and mutually agreed upon: by patient  Lisa Roca 07/10/2013, 5:14 PM

## 2013-07-10 NOTE — Progress Notes (Signed)
[name] [name] is a 43 y.o. right-handed female with history of hypertension, diabetes mellitus with peripheral neuropathy as well as multiple sclerosis diagnosed 2007. Patient lives with her son and used a Programmer, multimedia prior to admission. Presented 07/03/2013 with increasing weakness over the last 3-4 days as well as mild nausea with vomiting. There was report of a witnessed fall by her son striking her face without loss of consciousness. Cranial CT scan with no acute intracranial abnormalities. Severe changes of small vessel disease of the white matter consistent with multiple sclerosis. Placed on intravenous Solu-Medrol x5 days for suspect MS exacerbation completed 07/08/2013. Psychiatry was consulted in relation to depression placed on Wellbutrin  Subjective/Complaints: No new issues overnite. Oriented to person and "rehab" but not [facility] read date on calendar Asking about what is going on today  Denies pain, or breathing issues, very delayed responses  Review of Systems - limited by cognition  Objective: Vital Signs: Blood pressure 129/86, pulse 68, temperature 98 F (36.7 C), temperature source Oral, resp. rate 19, height '5\' 1"'  (1.549 m), weight 57.8 kg (127 lb 6.8 oz), last menstrual period 08/23/2012, SpO2 100.00%. No results found. Results for orders placed during the hospital encounter of 07/09/13 (from the past 72 hour(s))  GLUCOSE, CAPILLARY     Status: Abnormal   Collection Time    07/09/13  5:17 PM      Result Value Ref Range   Glucose-Capillary 151 (*) 70 - 99 mg/dL  CBC     Status: Abnormal   Collection Time    07/09/13  6:54 PM      Result Value Ref Range   WBC 11.6 (*) 4.0 - 10.5 K/uL   RBC 4.09  3.87 - 5.11 MIL/uL   Hemoglobin 13.1  12.0 - 15.0 g/dL   HCT 37.0  36.0 - 46.0 %   MCV 90.5  78.0 - 100.0 fL   MCH 32.0  26.0 - 34.0 pg   MCHC 35.4  30.0 - 36.0 g/dL   RDW 12.1  11.5 - 15.5 %   Platelets 307  150 - 400 K/uL  CREATININE, SERUM     Status: None   Collection Time    07/09/13  6:54 PM      Result Value Ref Range   Creatinine, Ser 0.53  0.50 - 1.10 mg/dL   GFR calc non Af Amer >90  >90 mL/min   GFR calc Af Amer >90  >90 mL/min   Comment: (NOTE)     The eGFR has been calculated using the CKD EPI equation.     This calculation has not been validated in all clinical situations.     eGFR's persistently <90 mL/min signify possible Chronic Kidney     Disease.  GLUCOSE, CAPILLARY     Status: Abnormal   Collection Time    07/09/13  9:05 PM      Result Value Ref Range   Glucose-Capillary 102 (*) 70 - 99 mg/dL     HEENT: normal Cardio: RRR and no murmurs Resp: CTA B/L GI: BS positive and NT,ND Extremity:  No Edema Skin:   Intact Neuro: Confused, Flat, Abnormal Sensory decreaswed sensation in both hands and feet to LT, Abnormal Motor 4- Left delt otherwise 4/5 in BUE, 3/5 BLE, Abnormal FMC Ataxic/ dec FMC and Tone  Hypertonia and Dysarthric Musc/Skel:  Other no pain with AAROM of limbs Gen NAD   Assessment/Plan: 1. Functional deficits secondary to MS exacerbation which require 3+ hours per day of interdisciplinary  therapy in a comprehensive inpatient rehab setting. Physiatrist is providing close team supervision and 24 hour management of active medical problems listed below. Physiatrist and rehab team continue to assess barriers to discharge/monitor patient progress toward functional and medical goals. FIM:                                  Medical Problem List and Plan:  1. Functional deficits secondary to MS exacerbation.Solumedrol 5 day course completed  2. DVT Prophylaxis/Anticoagulation: Subcutaneous Lovenox.Monitor platelet count any signs of bleeding  3. Pain Management: tylenol as needed  4. Mood: Wellbutrin 150 mg daily. Followup per psychiatry services.provide emotional support  5. Neuropsych: This patient is not capable of making decisions on her own behalf.  6.Hypertension.Norvasc 5 mg  daily,Lopressor 25 mg twice daily.Monitor with increased mobility  7.Diabetes Mellitus with peripheral neuropathy. Latest hemoglobin A1c 5.2. Check blood sugars a.c. and at bedtime. No current diabetic agents. Patient on Glucophage 500 mg daily prior to admission. Will resume as needed   LOS (Days) 1 A FACE TO FACE EVALUATION WAS PERFORMED  KIRSTEINS,ANDREW E 07/10/2013, 6:34 AM

## 2013-07-10 NOTE — Care Management Note (Signed)
Inpatient Rehabilitation Center Individual Statement of Services  Patient Name:  [name] [name]  Date:  07/10/2013  Welcome to the Inpatient Rehabilitation Center.  Our goal is to provide you with an individualized program based on your diagnosis and situation, designed to meet your specific needs.  With this comprehensive rehabilitation program, you will be expected to participate in at least 3 hours of rehabilitation therapies Monday-Friday, with modified therapy programming on the weekends.  Your rehabilitation program will include the following services:  Physical Therapy (PT), Occupational Therapy (OT), Speech Therapy (ST), 24 hour per day rehabilitation nursing, Therapeutic Recreaction (TR), Neuropsychology, Case Management (Social Worker), Rehabilitation Medicine, Nutrition Services and Pharmacy Services  Weekly team conferences will be held on Wednesday to discuss your progress.  Your Social Worker will talk with you frequently to get your input and to update you on team discussions.  Team conferences with you and your family in attendance may also be held.  Expected length of stay: 17-20 days  Overall anticipated outcome: min/mod level  Depending on your progress and recovery, your program may change. Your Social Worker will coordinate services and will keep you informed of any changes. Your Social Worker's name and contact numbers are listed  below.  The following services may also be recommended but are not provided by the Inpatient Rehabilitation Center:   Home Health Rehabiltiation Services  Outpatient Rehabilitation Services    Arrangements will be made to provide these services after discharge if needed.  Arrangements include referral to agencies that provide these services.  Your insurance has been verified to be:  Medicare & Medicaid Your primary doctor is:  Dr Hyman Hopes  Pertinent information will be shared with your doctor and your insurance company.  Social Worker:   Dossie Der, SW [phone] or ([phone]  Information discussed with and copy given to patient by: Lucy Chris, 07/10/2013, 2:52 PM

## 2013-07-10 NOTE — Progress Notes (Signed)
Patient information reviewed and entered into eRehab system by Tora DuckMarie Noel, RN, CRRN, PPS Coordinator.  Information including medical coding and functional independence measure will be reviewed and updated through discharge.     Per nursing patient was given "Data Collection Information Summary for Patients in Inpatient Rehabilitation Facilities with attached "Privacy Act Statement-Health Care Records" upon admission but due to patients cognition not able to review.  Nursing is awaiting sons arrival to review form with him.  Form is currently in patients education notebook.

## 2013-07-10 NOTE — Progress Notes (Signed)
Social Work Assessment and Plan Social Work Assessment and Plan  Patient Details  Name: [name] [name] MRN: [id number] Date of Birth: 03-Dec-1970  Today's Date: 07/10/2013  Problem List:  Patient Active Problem List   Diagnosis Date Noted  . Multiple sclerosis exacerbation 07/03/2013  . Sepsis 06/08/2013  . Aspiration pneumonia 06/03/2013  . Depression 07/21/2012  . Constipation 06/03/2012  . Cutaneous abscess of buttock 06/03/2012  . TIA (transient ischemic attack) 05/31/2012  . Multiple sclerosis, relapsing-remitting 05/29/2012  . Right leg weakness 05/28/2012  . Fall 05/28/2012  . Leukocytosis 05/28/2012  . Hypokalemia 05/28/2012  . BENIGN POSITIONAL VERTIGO 01/27/2010  . CARPAL TUNNEL SYNDROME, LEFT 04/14/2009  . SHOULDER STRAIN, LEFT 03/03/2009  . DIABETES MELLITUS, TYPE II 09/30/2006  . DYSLIPIDEMIA 09/30/2006  . TOBACCO USER 09/30/2006  . HYPERTENSION 09/30/2006  . Multiple sclerosis 12/05/2005   Past Medical History:  Past Medical History  Diagnosis Date  . Multiple sclerosis   . HYPERTENSION 09/30/2006  . DIABETES MELLITUS, TYPE II 09/30/2006  . TIA (transient ischemic attack)   . TOBACCO USER 09/30/2006    Qualifier: Diagnosis of  By: Delrae AlfredMulberry MD, [name]    . Multiple sclerosis, relapsing-remitting 05/29/2012  . DYSLIPIDEMIA 09/30/2006    Qualifier: Diagnosis of  By: Delrae AlfredMulberry MD, [name]    . DIABETES MELLITUS, TYPE II 09/30/2006    Qualifier: Diagnosis of  By: Delrae AlfredMulberry MD, [name]    . BENIGN POSITIONAL VERTIGO 01/27/2010    Qualifier: Diagnosis of  By: Delrae AlfredMulberry MD, [name]    . Stroke   . Neuromuscular disorder     hx of ms  . Depression    Past Surgical History: No past surgical history on file. Social History:  reports that she quit smoking about 2 years ago. She has never used smokeless tobacco. She reports that she does not drink alcohol or use illicit drugs.  Family / Support Systems Marital Status: Single Patient Roles: Parent;Other (Comment)  (Retiree) Children: Quentin-son  [phone]   Janelle-son's fiance Other Supports: Novella OliveBryant Gregory-brother  [phone] Anticipated Caregiver: Son and Janelle Ability/Limitations of Caregiver: Son is not employed and can provide assist, while Karolee StampsJanelle does work but also lives with them and assists pt at home Caregiver Availability: 24/7 Family Dynamics: Pt and son are very close, he has been assisting with her care for awhile now.  Pt reports: " He is all I have."  Son is very supportive of Mom and will do what is needed for her.  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  Son has been making decisions for pt, he may pursue HCPOA while pt is here.   Abuse/Neglect Physical Abuse: Denies Verbal Abuse: Denies Sexual Abuse: Denies Exploitation of patient/patient's resources: Denies Self-Neglect: Denies  Emotional Status Pt's affect, behavior adn adjustment status: Pt is slow to respond and does not iniate.  She wants to do for herself as much as she can, but at this time she seems to have something else going on.  MD checking her for UTI.  Therapists having difficulty evaluating due to her lethargy and difficulty following commands. Son reports she is slow to warm up to people, but once she does she will  do better. Recent Psychosocial Issues: Her main issue is her MS, and now depressed and this is being addressed Pyschiatric History: History of depression-seen by psych here and on medicine, will need follow up at discharge.  Will provide support while here and ask Neuro-psych to see while here. Substance  Abuse History: No issues  Patient / Family Perceptions, Expectations & Goals Pt/Family understanding of illness & functional limitations: Pt has a basic understanding of her condition but difficult to assess.  She lights up when you mention ice cream she does like her ice cream-according to son.  He has a good understanding of Mom's condition, but feels she is not progressing like he thought she would.  Will see if she warms up to staff and if she has a UTI to be treated.  Son plans to be here and observe in therapies. Premorbid pt/family roles/activities: Mother, Sister, Retiree, Friend, etc Anticipated changes in roles/activities/participation: resume Pt/family expectations/goals: Pt states: " I want to go home soon."  Son states: " I hope she can get back to what she could do before this."  [employer]: None Premorbid Home Care/DME Agencies: None Transportation available at discharge: Son Resource referrals recommended: Support group (specify) ([facility] SUpport group)  Discharge Planning Living Arrangements: Children Support Systems: Children;Friends/neighbors;Church/faith community Type of Residence: Private residence Insurance Resources: Medicare;Medicaid (specify county) Medical sales representative Co) Surveyor, quantity Resources: SSD Financial Screen Referred: Previously completed Living Expenses: Psychologist, sport and exercise Management: Family Does the patient have any problems obtaining your medications?: No Home Management: Son does home management and pt's medicines, along with the finances Patient/Family Preliminary Plans: Plan to return to son and his fiance assisting her, they provide assistance prior to admission with bathing and dressing, also stairs.  Son aware he will need to provide assistance to Mom at discharge and it would be helpful to have him here to answer questions and provide information. Social Work Anticipated Follow Up Needs: HH/OP;Support Group  Clinical Impression Pt seems  lethargic and has difficulty following commands.  May have UTI? Being checked for.  Son reports she needs to warm up to you before she will really participate in therapies. Son is committed to [facility] and will provide her care even if it is extensive care.  Asked him to be here and attend therapies with Mom, to answer questions and provide information. Will see how pt does and hopefully as here will warm up to staff and participate in therapies.  Work on a safe discharge plan.  Lucy Chris 07/10/2013, 3:23 PM

## 2013-07-10 NOTE — Evaluation (Addendum)
Physical Therapy Assessment and Plan  Patient Details  Name: [name] [name] MRN: [id number] Date of Birth: [birth year]  PT Diagnosis: Abnormal posture, Abnormality of gait, Cognitive deficits, Hypertonia and Muscle weakness Rehab Potential: Good ELOS: 17-20 days   Today's Date: 07/10/2013 Time: 6734-1937 Time Calculation (min): 73 min  Problem List:  Patient Active Problem List   Diagnosis Date Noted  . Multiple sclerosis exacerbation 07/03/2013  . Sepsis 06/08/2013  . Aspiration pneumonia 06/03/2013  . Depression 07/21/2012  . Constipation 06/03/2012  . Cutaneous abscess of buttock 06/03/2012  . TIA (transient ischemic attack) 05/31/2012  . Multiple sclerosis, relapsing-remitting 05/29/2012  . Right leg weakness 05/28/2012  . Fall 05/28/2012  . Leukocytosis 05/28/2012  . Hypokalemia 05/28/2012  . BENIGN POSITIONAL VERTIGO 01/27/2010  . CARPAL TUNNEL SYNDROME, LEFT 04/14/2009  . SHOULDER STRAIN, LEFT 03/03/2009  . DIABETES MELLITUS, TYPE II 09/30/2006  . DYSLIPIDEMIA 09/30/2006  . TOBACCO USER 09/30/2006  . HYPERTENSION 09/30/2006  . Multiple sclerosis 12/05/2005    Past Medical History:  Past Medical History  Diagnosis Date  . Multiple sclerosis   . HYPERTENSION 09/30/2006  . DIABETES MELLITUS, TYPE II 09/30/2006  . TIA (transient ischemic attack)   . TOBACCO USER 09/30/2006    Qualifier: Diagnosis of  By: Amil Amen MD, Benjamine Mola    . Multiple sclerosis, relapsing-remitting 05/29/2012  . DYSLIPIDEMIA 09/30/2006    Qualifier: Diagnosis of  By: Amil Amen MD, Benjamine Mola    . DIABETES MELLITUS, TYPE II 09/30/2006    Qualifier: Diagnosis of  By: Amil Amen MD, Benjamine Mola    . BENIGN POSITIONAL VERTIGO 01/27/2010    Qualifier: Diagnosis of  By: Amil Amen MD, Benjamine Mola    . Stroke   . Neuromuscular disorder     hx of ms  . Depression    Past Surgical History: No past surgical history on file.  Assessment & Plan Clinical Impression: [name] [name] is a 43 y.o. right-handed  female with history of hypertension, diabetes mellitus with peripheral neuropathy as well as multiple sclerosis diagnosed 2007. Patient lives with her son and used a Programmer, multimedia prior to admission. Presented 07/03/2013 with increasing weakness over the last 3-4 days as well as mild nausea with vomiting. There was report of a witnessed fall by her son striking her face without loss of consciousness. Cranial CT scan with no acute intracranial abnormalities. Severe changes of small vessel disease of the white matter consistent with multiple sclerosis. Placed on intravenous Solu-Medrol x5 days for suspect MS exacerbation completed 07/08/2013. Psychiatry was consulted in relation to depression placed on Wellbutrin. Subcutaneous Lovenox for DVT prophylaxis. Maintained on a regular consistency diet. Patient transferred to CIR on 07/09/2013 .   Patient currently requires +2 total with mobility secondary to muscle weakness, decreased cardiorespiratoy endurance, abnormal tone and unbalanced muscle activation, decreased midline orientation and decreased motor planning, decreased initiation, decreased attention, decreased awareness, decreased problem solving, decreased memory and delayed processing, dizziness of unknown origin and decreased sitting balance, decreased standing balance, decreased postural control and decreased balance strategies.  Prior to hospitalization, patient was min to total A with mobility and lived with Son;Other (Comment) (Son, [place], and his fiance, [place]) in a [place] home.  Home access is 13Stairs to enter.  Patient will benefit from skilled PT intervention to maximize safe functional mobility and minimize fall risk for planned discharge home with 24 hour assist.  Anticipate patient will benefit from follow up [facility] at discharge.  PT - End of Session Activity Tolerance: Tolerates 10 - 20 min activity  with multiple rests Endurance Deficit: Yes PT Assessment Rehab Potential: Good Barriers  to Discharge: Inaccessible home environment;Other (comment) (13 steps to enter home; pt's son reports carrying pt up/down ~50% of stairs PTA.) PT Patient demonstrates impairments in the following area(s): Balance;Behavior;Endurance;Motor;Perception;Safety;Sensory;Other (comment) (Cognition) PT Transfers Functional Problem(s): Bed Mobility;Bed to Chair;Car;Furniture PT Locomotion Functional Problem(s): Ambulation;Wheelchair Mobility;Stairs PT Plan PT Intensity: Minimum of 1-2 x/day ,45 to 90 minutes PT Frequency: 5 out of 7 days PT Duration Estimated Length of Stay: 17-20 days PT Treatment/Interventions: Ambulation/gait training;Balance/vestibular training;Cognitive remediation/compensation;Disease management/prevention;Discharge planning;DME/adaptive equipment instruction;Functional mobility training;Patient/family education;Neuromuscular re-education;Splinting/orthotics;Therapeutic Exercise;Visual/perceptual remediation/compensation;UE/LE Coordination activities;Therapeutic Activities;Stair training;UE/LE Strength taining/ROM;Wheelchair propulsion/positioning PT Transfers Anticipated Outcome(s): Supervision to [employer] A PT Locomotion Anticipated Outcome(s): Min A to Mod A PT Recommendation Recommendations for Other Services: Neuropsych consult;Other (comment) (Custom wheelchair evaluation) Follow Up Recommendations: 24 hour supervision/assistance;Home health PT;None Patient destination: Home (Home vs. SNF based on pt progress) Equipment Recommended: Wheelchair (measurements);Wheelchair cushion (measurements);Other (comment) (Pt will likely need bilateral AFO's)  Skilled Therapeutic Intervention PT evaluation initiated. See below for detailed findings. PT evaluation limited by decreased pt participation. Pt initially refusing to attempt bed mobility, standing, gait. Educated pt and son on goals of inpatient rehab as well as required pt participation in 3 hours of skilled therapy per day. Son verbalized  understanding; pt will likely require reinforcement. Pt finally agreeable to attempt standing with rolling walker only; however, pt with decreased initiation, consistently stating, "Wait, give me another minute," when verbal, tactile cueing provided to initiate standing. Per pt request to use bathroom, performed squat pivot transfer from w/c>toilet and lateral scooting transfer from toilet>w/c with grab bars and +2A for safety. See below for detailed description of transfers and sitting balance. Session ended in pt room, where pt was left seated in w/c with nurse tech and son present and all needs within reach.   PT Evaluation Precautions/Restrictions Precautions Precautions: Fall Restrictions Weight Bearing Restrictions: No General   Vital SignsTherapy Vitals Temp: 98.2 F (36.8 C) Temp src: Oral Pulse Rate: 73 Resp: 18 BP: 113/81 mmHg Patient Position (if appropriate): Sitting Oxygen Therapy SpO2: 100 % O2 Device: None (Room air) Pain Pain Assessment Pain Assessment: No/denies pain Pain Score: 0-No pain Home Living/Prior Functioning Home Living Available Help at Discharge: Family;Available 24 hours/day Type of Home: Apartment Home Access: Stairs to enter Entrance Stairs-Number of Steps: 13 Entrance Stairs-Rails: Right;Left Home Layout: One level Additional Comments: Per son, pt owns personal bedside commode and raised toilet seat. (Poor hisotrian; )  Lives With: Son;Other (Comment) (Son, [place], and his fiance, [place]) Prior Function Level of Independence: Requires assistive device for independence;Needs assistance with ADLs;Needs assistance with homemaking;Other (comment);Needs assistance with gait;Needs assistance with tranfers (Per son, pt required assistance to step into underwear and pants in morning) Driving: No Vocation: Unemployed Comments: Per son, owns personal rolling walker but no w/c. Vision/Perception     Cognition Overall Cognitive Status: No  family/caregiver present to determine baseline cognitive functioning Orientation Level: Oriented to person;Oriented to place;Disoriented to time;Oriented to situation Attention: Selective;Alternating Selective Attention: Impaired Selective Attention Impairment: Functional basic Alternating Attention: Impaired Alternating Attention Impairment: Verbal basic;Functional basic Memory: Impaired Memory Impairment: Storage deficit;Retrieval deficit;Decreased short term memory;Decreased recall of new information Decreased Short Term Memory: Verbal basic Awareness: Impaired Awareness Impairment: Intellectual impairment;Emergent impairment Problem Solving: Impaired Problem Solving Impairment: Functional basic Executive Function: Sequencing;Initiating;Organizing Sequencing: Impaired Sequencing Impairment: Functional basic Organizing: Impaired Organizing Impairment: Functional basic Initiating: Impaired Initiating Impairment: Verbal basic;Functional basic Behaviors: Perseveration;Lability Safety/Judgment: Impaired Sensation Sensation Light  Touch: Appears Intact Proprioception: Impaired Detail Proprioception Impaired Details: Impaired LLE Additional Comments: Difficult to assess secondary to cognitive impairments. RLE proprioception appears grossly intact; L ankle/great toe impaired.  Coordination Gross Motor Movements are Fluid and Coordinated: No Fine Motor Movements are Fluid and Coordinated: No Finger Nose Finger Test: slow and undershooting with R > L Heel Shin Test: Decreased smoothness and excurison on RLE. Unable to perform on LLE secondary to weakness. Motor  Motor Motor: Abnormal tone;Motor impersistence Motor - Skilled Clinical Observations:  (Pt.sitting in posterior pelvic tilt in wc)  Mobility Bed Mobility Bed Mobility: Not assessed (Secondary to pt refusal to transfer from w/c>bed) Transfers Transfers: Yes Stand Pivot Transfers: 1: +2 Total assist (Pt did mod assist but  needed +2 for safety) Stand Pivot Transfer Details: Visual cues/gestures for sequencing;Manual facilitation for weight shifting;Manual facilitation for weight bearing Squat Pivot Transfers: 1: +2 Total assist Squat Pivot Transfer Details: Tactile cues for initiation;Tactile cues for weight beaing;Tactile cues for placement;Verbal cues for technique;Verbal cues for precautions/safety;Manual facilitation for weight shifting;Verbal cues for sequencing;Tactile cues for sequencing Squat Pivot Transfer Details (indicate cue type and reason): Pt performed squat pivot transfer from w/c>toilet with grab bars and multimodal cueing for anterior weight shift, hand placement on grab bars, manaul stabilization of LLE, and manual placement of hips in chair secondary posterior trunk lean/pushing Lateral/Scoot Transfers: 1: +2 Total assist;With armrests removed Lateral/Scoot Transfer Details: Tactile cues for initiation;Manual facilitation for weight shifting;Verbal cues for technique;Verbal cues for precautions/safety;Tactile cues for sequencing;Tactile cues for placement;Verbal cues for sequencing Lateral/Scoot Transfer Details (indicate cue type and reason): Pt performed lateral scooting transfer from toilet>w/c with grab bars and manual facilitation, cueing as described above for squat pivot transfer. Locomotion  Ambulation Ambulation: No (Pt refused to attempt) Gait Gait: No Stairs / Additional Locomotion Stairs: No (Unsafe at this time) Wheelchair Mobility Wheelchair Mobility: No  Trunk/Postural Assessment  Cervical Assessment Cervical Assessment: Exceptions to [facility] Cervical AROM Overall Cervical AROM: Deficits Thoracic Assessment Thoracic Assessment: Exceptions to [facility] Lumbar Assessment Lumbar Assessment: Exceptions to [facility]  Balance Balance Balance Assessed: Yes Static Sitting Balance Static Sitting - Balance Support: Feet supported;Bilateral upper extremity supported Static Sitting - Level of  Assistance: 2: Max assist;3: Mod assist Static Sitting - Comment/# of Minutes: Seated on toilet x10 minutes with RUE at grab bar, L HHA; pt required mod-max A to maintain balance secondary to posterolateral (left-sided) pushing with trunk. Dynamic Sitting Balance Dynamic Sitting - Balance Support: Bilateral upper extremity supported;Feet supported Dynamic Sitting - Level of Assistance: 2: Max assist;1: +1 Total assist Dynamic Sitting - Balance Activities: Lateral lean/weight shifting Static Standing Balance Static Standing - Balance Support: During functional activity;Bilateral upper extremity supported Static Standing - Level of Assistance: 1: +1 Total assist Static Standing - Comment/# of Minutes: Static standing with bilat UE support at grab bars x30 seconds as nurse tech pulled up brief/pants, pt required Total A for standing balance. Extremity Assessment  RLE Assessment RLE Assessment: Exceptions to [facility] RLE Strength RLE Overall Strength: Deficits Right Hip Flexion: 3+/5 Right Knee Flexion: 3-/5 Right Knee Extension: 4/5 Right Ankle Dorsiflexion: 1/5 Right Ankle Plantar Flexion: 3+/5 RLE Tone RLE Tone: Mild RLE Tone Comments: Quadriceps LLE Assessment LLE Assessment: Exceptions to WFL LLE Strength LLE Overall Strength: Deficits Left Hip Flexion: 3-/5 Left Knee Flexion: 2+/5 Left Knee Extension: 3-/5 Left Ankle Dorsiflexion: 0/5 Left Ankle Plantar Flexion: 0/5 LLE Tone LLE Tone: Moderate;Mild LLE Tone Comments: Mild-moderate tone in L quadriceps. Clonus (>20 beats) in L  ankle plantarflexors.  FIM:  FIM - Bed/Chair Transfer Bed/Chair Transfer: 0: Activity did not occur FIM - Locomotion: Wheelchair Locomotion: Wheelchair: 0: Activity did not occur FIM - Locomotion: Ambulation Locomotion: Ambulation: 0: Activity did not occur (Pt not agreeable to attempting ambulation) FIM - Locomotion: Stairs Locomotion: Stairs: 0: Activity did not occur (Unsafe at this time)   Refer to  Care Plan for Long Term Goals  Recommendations for other services: Neuropsych and Other: wheelchair evaluation  Discharge Criteria: Patient will be discharged from PT if patient refuses treatment 3 consecutive times without medical reason, if treatment goals not met, if there is a change in medical status, if patient makes no progress towards goals or if patient is discharged from hospital.  The above assessment, treatment plan, treatment alternatives and goals were discussed and mutually agreed upon: by patient and by family  Stefano Gaul 07/10/2013, 4:54 PM

## 2013-07-11 ENCOUNTER — Encounter (HOSPITAL_COMMUNITY): Payer: Self-pay | Admitting: *Deleted

## 2013-07-11 ENCOUNTER — Inpatient Hospital Stay (HOSPITAL_COMMUNITY): Payer: Self-pay | Admitting: Physical Therapy

## 2013-07-11 ENCOUNTER — Inpatient Hospital Stay (HOSPITAL_COMMUNITY): Payer: Medicare Other

## 2013-07-11 ENCOUNTER — Inpatient Hospital Stay (HOSPITAL_COMMUNITY): Payer: Medicare Other | Admitting: Occupational Therapy

## 2013-07-11 DIAGNOSIS — G35 Multiple sclerosis: Secondary | ICD-10-CM

## 2013-07-11 LAB — GLUCOSE, CAPILLARY
GLUCOSE-CAPILLARY: 112 mg/dL — AB (ref 70–99)
GLUCOSE-CAPILLARY: 144 mg/dL — AB (ref 70–99)
Glucose-Capillary: 117 mg/dL — ABNORMAL HIGH (ref 70–99)
Glucose-Capillary: 93 mg/dL (ref 70–99)
Glucose-Capillary: 122 mg/dL — ABNORMAL HIGH (ref 70–99)

## 2013-07-11 NOTE — Progress Notes (Signed)
Physical Therapy Session Note  Patient Details  Name: [name] [name] MRN: [id number] Date of Birth: 16-Sep-1970  Today's Date: 07/11/2013 Time: 3832-9191 Time Calculation (min): 59 min  Short Term Goals: Week 1:  PT Short Term Goal 1 (Week 1): Pt will perform supine<>sit with max A and 75% cueing for initiation and sequencing. PT Short Term Goal 2 (Week 1): Pt will perform bed<>w/c transfer with Max A and 75% cueing for initiation and sequencing. PT Short Term Goal 3 (Week 1): Pt will perform w/c mobility x20' in controlled environment with max A and 75% cueing. PT Short Term Goal 4 (Week 1): Pt will perform sit<>stand transfer from w/c with max A and 75% cueing.  Skilled Therapeutic Interventions/Progress Updates:   Pt resting in bed; pt oriented to self, place and situation.  Pt requesting to use toilet.  BSC positioned beside bed.  Pt performed supine > sit from flat bed with rail with total A and total verbal cues for initiation and seqence for rolling and side > sit; pt also required significant amount of time to initiate.  During side > sit pt required significant physical assistance to sit fully upright EOB secondary to tendency to laterally flex neck and trunk to R with increased fear of weigh shifting to L; pt also keeps gaze and head rotated to R.  Attempted squat pivot bed > BSC x 3 reps unsuccessfully secondary to pt gripping bed rail tightly, keeping LLE fully extended to prevent weight shift to L.  Lowered bed rail and performed stand pivot bed > w/c max A with increased assistance needed to weight shift pt to L to [facility].  While pt performed urination on [facility] therapist provided gentle PROM to head/neck to bring head in midline (pt kept gaze to R) and to assist with rotation to L.  Pt noted to have significant tension on R side of neck and resistance to passive movement.  Pt able to perform hygiene but required +2 A to stand x 2 from [facility] to don clean brief and transfer to w/c.  Once in w/c  pt continued to require +2 to reposition in w/c secondary to tendency to push posterior and R.  During rolling and transfers pt c/o vertigo with impaired eye movements noted.  Pt may benefit from vestibular evaluation.    Therapy Documentation Precautions:  Precautions Precautions: Fall Restrictions Weight Bearing Restrictions: NoVital Signs: Therapy Vitals Pulse Rate: 67 Resp: 18 BP: 107/77 mmHg Patient Position (if appropriate): Sitting Oxygen Therapy SpO2: 100 % O2 Device: None (Room air) Pain: Pain Assessment Pain Assessment: No/denies pain Pain Score: 0-No pain  See FIM for current functional status  Therapy/Group: Individual Therapy  Edman Circle [facility] 07/11/2013, 12:06 PM

## 2013-07-11 NOTE — Progress Notes (Signed)
Occupational Therapy Session Note  Patient Details  Name: TORIONA DEPIES MRN: [id number] Date of Birth: 1970-08-26  Today's Date: 07/11/2013  1st session Time: 1035-1120 Time Calculation (min): 45 min Patient stated she'd bathed last night.  Today's session was inhibited by the following:  Delayed cognitive processing, poor STM,  along with other cognitive deficits, fear of falling, and decreased muscular strength as a result of the MS exacerbation.  Her overall dressing status was Total A.   She was, with extra extra time for processing, able to stand from w/c to sink with Max A long enough for this clinician to pull up her pants while she braced against sink.  Her recall and orientation were inconsistent during the session.  2nd session Time:  1330-1415 Time Calculation (min):45    Patient expressed need to void; so, session was centered around this task.   She required +2 Max assist for the w/c to [facility] to bed  transfer along with hygiene and clothing mgmt.  Again, this session, patient required extra, extra time to process and initiate/complete tasks.  She requested to ly down at the end of the session and position slightly on her right side and with call bell in place.   She did c/o Vertigo after the transfer from bsc to bed  Therapy Documentation Precautions:  Precautions Precautions: Fall Restrictions Weight Bearing Restrictions: No  Pain: Pain Assessment Pain Assessment: No/denies pain  See FIM for current functional status  Therapy/Group: Individual Therapy  Rozelle Logan 07/11/2013, 3:33 PM

## 2013-07-11 NOTE — Progress Notes (Signed)
Speech Language Pathology Daily Session Note  Patient Details  Name: [name] [name] MRN: [id number] Date of Birth: Feb 25, 1970  Today's Date: 07/11/2013 Time: 1300-1330 Time Calculation (min): 30 min  Short Term Goals: Week 1: SLP Short Term Goal 1 (Week 1): Pt will improve short term memory for recall of daily events and information for 75% accuracy with mod assist  SLP Short Term Goal 2 (Week 1): Pt will improve executive function for sequencing, organization, and error awareness during basic tasks for 75% accuracy with mod assist.  SLP Short Term Goal 3 (Week 1): Pt will tolerate her currently prescribed diet with no overt s/s of aspiration with modified independence  SLP Short Term Goal 4 (Week 1): Pt will selectively attend to functional tasks in a minimally distracting environment for 7-10 minutes with min cuing for redirection.   SLP Short Term Goal 5 (Week 1): Pt will improve her speech intelligibility to 80% accurate at the conversational level with min cuing for use of compensatory strategies.    Skilled Therapeutic Interventions: Skilled treatment focused on cognitive and swallowing goals. Upon SLP arrival, pt was consuming her lunch meal consisting of regular textures and thin liquids. Pt had no overt s/s of aspiration with supervision level  A for use of safe swallowing strategies. Pt required Total A for recall of what she ate for lunch, denying that she ate chicken when a partially eaten piece of chicken was on her plate. Pt required Max cues for recall of therapies and daily events from earlier today. She completed categorical naming task with Max cues to name 3 items. At the end of treatment, pt verbalized her need to use the bathroom and OT and NT arrived to assist. Continue plan of care.   FIM:  Comprehension Comprehension Mode: Auditory Comprehension: 4-Understands basic 75 - 89% of the time/requires cueing 10 - 24% of the time Expression Expression Mode:  Verbal Expression: 4-Expresses basic 75 - 89% of the time/requires cueing 10 - 24% of the time. Needs helper to occlude trach/needs to repeat words. Social Interaction Social Interaction: 4-Interacts appropriately 75 - 89% of the time - Needs redirection for appropriate language or to initiate interaction. Problem Solving Problem Solving: 3-Solves basic 50 - 74% of the time/requires cueing 25 - 49% of the time Memory Memory: 2-Recognizes or recalls 25 - 49% of the time/requires cueing 51 - 75% of the time FIM - Eating Eating Activity: 5: Supervision/cues  Pain Pain Assessment Pain Assessment: No/denies pain Pain Score: 0-No pain  Therapy/Group: Individual Therapy   Maxcine Ham, M.A. CCC-SLP [phone]  Maxcine Ham 07/11/2013, 2:44 PM

## 2013-07-11 NOTE — Progress Notes (Signed)
[name] [name] is a 43 y.o. right-handed female with history of hypertension, diabetes mellitus with peripheral neuropathy as well as multiple sclerosis diagnosed 2007. Patient lives with her son and used a [employer], [employer] prior to admission. Presented 07/03/2013 with increasing weakness over the last 3-4 days as well as mild nausea with vomiting. There was report of a witnessed fall by her son striking her face without loss of consciousness. Cranial CT scan with no acute intracranial abnormalities. Severe changes of small vessel disease of the white matter consistent with multiple sclerosis. Placed on intravenous Solu-Medrol x5 days for suspect MS exacerbation completed 07/08/2013. Psychiatry was consulted in relation to depression placed on Wellbutrin  Subjective/Complaints: No new issues overnite. Oriented to person and "rehab" but not [facility] read date on calendar Asking about what is going on today  Denies pain, or breathing issues, very delayed responses  Review of Systems - limited by cognition  Objective: Vital Signs: Blood pressure 127/87, pulse 58, temperature 97.8 F (36.6 C), temperature source Oral, resp. rate 19, height 5' 1" (1.549 m), weight 57.8 kg (127 lb 6.8 oz), last menstrual period 08/23/2012, SpO2 100.00%. No results found. Results for orders placed during the hospital encounter of 07/09/13 (from the past 72 hour(s))  GLUCOSE, CAPILLARY     Status: Abnormal   Collection Time    07/09/13  5:17 PM      Result Value Ref Range   Glucose-Capillary 151 (*) 70 - 99 mg/dL  CBC     Status: Abnormal   Collection Time    07/09/13  6:54 PM      Result Value Ref Range   WBC 11.6 (*) 4.0 - 10.5 K/uL   RBC 4.09  3.87 - 5.11 MIL/uL   Hemoglobin 13.1  12.0 - 15.0 g/dL   HCT 37.0  36.0 - 46.0 %   MCV 90.5  78.0 - 100.0 fL   MCH 32.0  26.0 - 34.0 pg   MCHC 35.4  30.0 - 36.0 g/dL   RDW 12.1  11.5 - 15.5 %   Platelets 307  150 - 400 K/uL  CREATININE, SERUM     Status: None   Collection Time    07/09/13  6:54 PM      Result Value Ref Range   Creatinine, Ser 0.53  0.50 - 1.10 mg/dL   GFR calc non Af Amer >90  >90 mL/min   GFR calc Af Amer >90  >90 mL/min   Comment: (NOTE)     The eGFR has been calculated using the CKD EPI equation.     This calculation has not been validated in all clinical situations.     eGFR's persistently <90 mL/min signify possible Chronic Kidney     Disease.  GLUCOSE, CAPILLARY     Status: Abnormal   Collection Time    07/09/13  9:05 PM      Result Value Ref Range   Glucose-Capillary 102 (*) 70 - 99 mg/dL  CBC WITH DIFFERENTIAL     Status: Abnormal   Collection Time    07/10/13  6:25 AM      Result Value Ref Range   WBC 10.6 (*) 4.0 - 10.5 K/uL   RBC 4.45  3.87 - 5.11 MIL/uL   Hemoglobin 14.3  12.0 - 15.0 g/dL   HCT 40.4  36.0 - 46.0 %   MCV 90.8  78.0 - 100.0 fL   MCH 32.1  26.0 - 34.0 pg   MCHC 35.4  30.0 - 36.0  g/dL   RDW 12.2  11.5 - 15.5 %   Platelets 269  150 - 400 K/uL   Neutrophils Relative % 60  43 - 77 %   Neutro Abs 6.3  1.7 - 7.7 K/uL   Lymphocytes Relative 28  12 - 46 %   Lymphs Abs 3.0  0.7 - 4.0 K/uL   Monocytes Relative 12  3 - 12 %   Monocytes Absolute 1.3 (*) 0.1 - 1.0 K/uL   Eosinophils Relative 0  0 - 5 %   Eosinophils Absolute 0.0  0.0 - 0.7 K/uL   Basophils Relative 0  0 - 1 %   Basophils Absolute 0.0  0.0 - 0.1 K/uL  COMPREHENSIVE METABOLIC PANEL     Status: Abnormal   Collection Time    07/10/13  6:25 AM      Result Value Ref Range   Sodium 142  137 - 147 mEq/L   Potassium 3.7  3.7 - 5.3 mEq/L   Chloride 104  96 - 112 mEq/L   CO2 27  19 - 32 mEq/L   Glucose, Bld 100 (*) 70 - 99 mg/dL   BUN 15  6 - 23 mg/dL   Creatinine, Ser 0.60  0.50 - 1.10 mg/dL   Calcium 8.7  8.4 - 10.5 mg/dL   Total Protein 6.4  6.0 - 8.3 g/dL   Albumin 3.4 (*) 3.5 - 5.2 g/dL   AST 14  0 - 37 U/L   ALT 23  0 - 35 U/L   Alkaline Phosphatase 78  39 - 117 U/L   Total Bilirubin 1.2  0.3 - 1.2 mg/dL   GFR calc non Af  Amer >90  >90 mL/min   GFR calc Af Amer >90  >90 mL/min   Comment: (NOTE)     The eGFR has been calculated using the CKD EPI equation.     This calculation has not been validated in all clinical situations.     eGFR's persistently <90 mL/min signify possible Chronic Kidney     Disease.  GLUCOSE, CAPILLARY     Status: None   Collection Time    07/10/13  7:17 AM      Result Value Ref Range   Glucose-Capillary 92  70 - 99 mg/dL  GLUCOSE, CAPILLARY     Status: Abnormal   Collection Time    07/10/13 11:46 AM      Result Value Ref Range   Glucose-Capillary 108 (*) 70 - 99 mg/dL  GLUCOSE, CAPILLARY     Status: Abnormal   Collection Time    07/10/13  4:55 PM      Result Value Ref Range   Glucose-Capillary 143 (*) 70 - 99 mg/dL  GLUCOSE, CAPILLARY     Status: Abnormal   Collection Time    07/10/13  9:35 PM      Result Value Ref Range   Glucose-Capillary 117 (*) 70 - 99 mg/dL  GLUCOSE, CAPILLARY     Status: None   Collection Time    07/11/13  7:20 AM      Result Value Ref Range   Glucose-Capillary 93  70 - 99 mg/dL   Comment 1 Notify RN       HEENT: normal Cardio: RRR and no murmurs Resp: CTA B/L GI: BS positive and NT,ND Extremity:  No Edema Skin:   Intact Neuro: Confused, Flat, more alert. Delayed processing, Abnormal Sensory decreaswed sensation in both hands and feet to LT, Abnormal Motor 4- Left delt otherwise  4/5 in BUE, 3/5 BLE, Abnormal FMC Ataxic/ dec FMC and Tone  Hypertonia and Dysarthric Musc/Skel:  Other no pain with AAROM of limbs Gen NAD Psych: flat, not anxious today   Assessment/Plan: 1. Functional deficits secondary to MS exacerbation which require 3+ hours per day of interdisciplinary therapy in a comprehensive inpatient rehab setting. Physiatrist is providing close team supervision and 24 hour management of active medical problems listed below. Physiatrist and rehab team continue to assess barriers to discharge/monitor patient progress toward functional and  medical goals. FIM: FIM - Bathing Bathing: 1: Total-Patient completes 0-2 of 10 parts or less than 25%  FIM - Upper Body Dressing/Undressing Upper body dressing/undressing: 0: Wears gown/pajamas-no public clothing FIM - Lower Body Dressing/Undressing Lower body dressing/undressing: 0: Wears gown/pajamas-no public clothing  FIM - Toileting Toileting: 1: Two helpers  FIM - Toilet Transfers Toilet Transfers Assistive Devices: Grab bars Toilet Transfers: 1-Two helpers  FIM - Bed/Chair Transfer Bed/Chair Transfer Assistive Devices: Bed rails;Arm rests Bed/Chair Transfer: 2: Supine > Sit: Max A (lifting assist/Pt. 25-49%);1: Two helpers  FIM - Locomotion: Wheelchair Locomotion: Wheelchair: 0: Activity did not occur FIM - Locomotion: Ambulation Locomotion: Ambulation: 0: Activity did not occur (Pt refused to attempt)  Comprehension Comprehension Mode: Auditory Comprehension: 5-Follows basic conversation/direction: With no assist  Expression Expression Mode: Verbal Expression: 5-Expresses basic 90% of the time/requires cueing < 10% of the time.  Social Interaction Social Interaction: 4-Interacts appropriately 75 - 89% of the time - Needs redirection for appropriate language or to initiate interaction.  Problem Solving Problem Solving: 3-Solves basic 50 - 74% of the time/requires cueing 25 - 49% of the time  Memory Memory: 2-Recognizes or recalls 25 - 49% of the time/requires cueing 51 - 75% of the time  Medical Problem List and Plan:  1. Functional deficits secondary to MS exacerbation.Solumedrol 5 day course completed  2. DVT Prophylaxis/Anticoagulation: Subcutaneous Lovenox.Monitor platelet count any signs of bleeding  3. Pain Management: tylenol as needed  4. Mood: Wellbutrin 150 mg daily. Followup per psychiatry services.provide emotional support  5. Neuropsych: This patient is not capable of making decisions on her own behalf.  6.Hypertension.Norvasc 5 mg daily,Lopressor  25 mg twice daily. bp's acceptably elevated at present 7.Diabetes Mellitus with peripheral neuropathy. Latest hemoglobin A1c 5.2.   -Patient on Glucophage 500 mg daily prior to admission.  -no meds at present  -reasonable control  LOS (Days) 2 A FACE TO FACE EVALUATION WAS PERFORMED  , T 07/11/2013, 7:56 AM    

## 2013-07-12 DIAGNOSIS — G35D Multiple sclerosis, unspecified: Secondary | ICD-10-CM

## 2013-07-12 DIAGNOSIS — G35 Multiple sclerosis: Secondary | ICD-10-CM

## 2013-07-12 LAB — GLUCOSE, CAPILLARY
GLUCOSE-CAPILLARY: 98 mg/dL (ref 70–99)
Glucose-Capillary: 100 mg/dL — ABNORMAL HIGH (ref 70–99)
Glucose-Capillary: 118 mg/dL — ABNORMAL HIGH (ref 70–99)
Glucose-Capillary: 144 mg/dL — ABNORMAL HIGH (ref 70–99)

## 2013-07-12 NOTE — IPOC Note (Signed)
Overall Plan of Care [facility](IPOC) Patient Details Name: [name] LeaDeirdre K [name] MRN: [id number] DOB: Dec 17, 1970  Admitting Diagnosis: MS exac  yes SLP  Hospital Problems: Active Problems:   Multiple sclerosis exacerbation     Functional Problem List: Nursing Endurance;Medication Management;Motor;Safety  PT Balance;Behavior;Endurance;Motor;Perception;Safety;Sensory;Other (comment) (Cognition)  OT Balance;Behavior;Cognition;Endurance;Motor;Perception;Safety;Sensory;Vision  SLP Cognition;Safety  TR         Basic ADL's: OT Eating;Grooming;Bathing;Dressing;Toileting     Advanced  ADL's: OT       Transfers: PT Bed Mobility;Bed to Chair;Car;Furniture  OT Toilet     Locomotion: PT Ambulation;Wheelchair Mobility;Stairs     Additional Impairments: OT Fuctional Use of Upper Extremity  SLP Communication;Social Cognition expression Attention;Awareness;Memory;Problem Solving  TR      Anticipated Outcomes Item Anticipated Outcome  Self Feeding mod I  Swallowing  mod I with least restrictive diet    Basic self-care  minimal assist  Toileting  minimal assist   Bathroom Transfers minimal assist  Bowel/Bladder  Mod I  Transfers  Supervision to Min A  Locomotion  Min A to Mod A  Communication  supervision for speech intelligibility   Cognition  min assist   Pain  < 3  Safety/Judgment  Supervision   Therapy Plan: PT Intensity: Minimum of 1-2 x/day ,45 to 90 minutes PT Frequency: 5 out of 7 days PT Duration Estimated Length of Stay: 17-20 days OT Intensity: Minimum of 1-2 x/day, 45 to 90 minutes OT Frequency: 5 out of 7 days OT Duration/Estimated Length of Stay: 18-21 days SLP Intensity: Minumum of 1-2 x/day, 30 to 90 minutes SLP Frequency: 5 out of 7 days SLP Duration/Estimated Length of Stay: 14-21 days       Team Interventions: Nursing Interventions Patient/Family Education;Disease Management/Prevention;Medication Management;Cognitive Remediation/Compensation  PT  interventions Ambulation/gait training;Balance/vestibular training;Cognitive remediation/compensation;Disease management/prevention;Discharge planning;DME/adaptive equipment instruction;Functional mobility training;Patient/family education;Neuromuscular re-education;Splinting/orthotics;Therapeutic Exercise;Visual/perceptual remediation/compensation;UE/LE Coordination activities;Therapeutic Activities;Stair training;UE/LE Strength taining/ROM;Wheelchair propulsion/positioning  OT Interventions Balance/vestibular training;Cognitive remediation/compensation;Discharge planning;DME/adaptive equipment instruction;Functional mobility training;Patient/family education;Psychosocial support;Self Care/advanced ADL retraining;Therapeutic Activities;Therapeutic Exercise;UE/LE Strength taining/ROM;UE/LE Coordination activities;Visual/perceptual remediation/compensation;Wheelchair propulsion/positioning  SLP Interventions Cognitive remediation/compensation;Cueing hierarchy;Patient/family education;Internal/external aids;Oral motor exercises;Dysphagia/aspiration precaution training  TR Interventions    SW/CM Interventions Discharge Planning;Psychosocial Support;Patient/Family Education    Team Discharge Planning: Destination: PT-Home (Home vs. SNF based on pt progress) ,OT- Home , SLP-Home Projected Follow-up: PT-24 hour supervision/assistance;Home health PT;None, OT-   , SLP-Home Health SLP Projected Equipment Needs: PT-Wheelchair (measurements);Wheelchair cushion (measurements);Other (comment) (Pt will likely need bilateral AFO's), OT- None recommended by OT, SLP-None recommended by SLP Equipment Details: PT- , OT-  Patient/family involved in discharge planning: PT- Patient;Family member/caregiver,  OT-Patient, SLP-Patient  MD ELOS: 18-20 days Medical Rehab Prognosis:  Good Assessment: The patient has been admitted for CIR therapies with the diagnosis of MS exacerbation. The team will be addressing functional  mobility, strength, stamina, balance, safety, adaptive techniques and equipment, self-care, bowel and bladder mgt, patient and caregiver education, NMR, visual -perceptual and cognitive perceptual awareness, cognition, communication, swallowing, egosupport. Goals have been set at min assist to mod I with self-care and supervision to min assist with mobility, supervision to min assist with cognition and communication.    Ranelle OysterZachary T. Swartz, MD, FAAPMR      See Team Conference Notes for weekly updates to the plan of care

## 2013-07-12 NOTE — Progress Notes (Signed)
[name] [name] is a 43 y.o. right-handed female with history of hypertension, diabetes mellitus with peripheral neuropathy as well as multiple sclerosis diagnosed 2007. Patient lives with her son and used a Programmer, multimedia prior to admission. Presented 07/03/2013 with increasing weakness over the last 3-4 days as well as mild nausea with vomiting. There was report of a witnessed fall by her son striking her face without loss of consciousness. Cranial CT scan with no acute intracranial abnormalities. Severe changes of small vessel disease of the white matter consistent with multiple sclerosis. Placed on intravenous Solu-Medrol x5 days for suspect MS exacerbation completed 07/08/2013. Psychiatry was consulted in relation to depression placed on Wellbutrin  Subjective/Complaints: Asking about schedule today (none in room) Denies pain, or breathing issues, very delayed responses once again  Review of Systems - limited by cognition  Objective: Vital Signs: Blood pressure 115/78, pulse 57, temperature 97.8 F (36.6 C), temperature source Oral, resp. rate 17, height 5' 1" (1.549 m), weight 57.8 kg (127 lb 6.8 oz), last menstrual period 08/23/2012, SpO2 100.00%. No results found. Results for orders placed during the hospital encounter of 07/09/13 (from the past 72 hour(s))  GLUCOSE, CAPILLARY     Status: Abnormal   Collection Time    07/09/13  5:17 PM      Result Value Ref Range   Glucose-Capillary 151 (*) 70 - 99 mg/dL  CBC     Status: Abnormal   Collection Time    07/09/13  6:54 PM      Result Value Ref Range   WBC 11.6 (*) 4.0 - 10.5 K/uL   RBC 4.09  3.87 - 5.11 MIL/uL   Hemoglobin 13.1  12.0 - 15.0 g/dL   HCT 37.0  36.0 - 46.0 %   MCV 90.5  78.0 - 100.0 fL   MCH 32.0  26.0 - 34.0 pg   MCHC 35.4  30.0 - 36.0 g/dL   RDW 12.1  11.5 - 15.5 %   Platelets 307  150 - 400 K/uL  CREATININE, SERUM     Status: None   Collection Time    07/09/13  6:54 PM      Result Value Ref Range   Creatinine,  Ser 0.53  0.50 - 1.10 mg/dL   GFR calc non Af Amer >90  >90 mL/min   GFR calc Af Amer >90  >90 mL/min   Comment: (NOTE)     The eGFR has been calculated using the CKD EPI equation.     This calculation has not been validated in all clinical situations.     eGFR's persistently <90 mL/min signify possible Chronic Kidney     Disease.  GLUCOSE, CAPILLARY     Status: Abnormal   Collection Time    07/09/13  9:05 PM      Result Value Ref Range   Glucose-Capillary 102 (*) 70 - 99 mg/dL  CBC WITH DIFFERENTIAL     Status: Abnormal   Collection Time    07/10/13  6:25 AM      Result Value Ref Range   WBC 10.6 (*) 4.0 - 10.5 K/uL   RBC 4.45  3.87 - 5.11 MIL/uL   Hemoglobin 14.3  12.0 - 15.0 g/dL   HCT 40.4  36.0 - 46.0 %   MCV 90.8  78.0 - 100.0 fL   MCH 32.1  26.0 - 34.0 pg   MCHC 35.4  30.0 - 36.0 g/dL   RDW 12.2  11.5 - 15.5 %   Platelets 269  150 - 400 K/uL   Neutrophils Relative % 60  43 - 77 %   Neutro Abs 6.3  1.7 - 7.7 K/uL   Lymphocytes Relative 28  12 - 46 %   Lymphs Abs 3.0  0.7 - 4.0 K/uL   Monocytes Relative 12  3 - 12 %   Monocytes Absolute 1.3 (*) 0.1 - 1.0 K/uL   Eosinophils Relative 0  0 - 5 %   Eosinophils Absolute 0.0  0.0 - 0.7 K/uL   Basophils Relative 0  0 - 1 %   Basophils Absolute 0.0  0.0 - 0.1 K/uL  COMPREHENSIVE METABOLIC PANEL     Status: Abnormal   Collection Time    07/10/13  6:25 AM      Result Value Ref Range   Sodium 142  137 - 147 mEq/L   Potassium 3.7  3.7 - 5.3 mEq/L   Chloride 104  96 - 112 mEq/L   CO2 27  19 - 32 mEq/L   Glucose, Bld 100 (*) 70 - 99 mg/dL   BUN 15  6 - 23 mg/dL   Creatinine, Ser 0.60  0.50 - 1.10 mg/dL   Calcium 8.7  8.4 - 10.5 mg/dL   Total Protein 6.4  6.0 - 8.3 g/dL   Albumin 3.4 (*) 3.5 - 5.2 g/dL   AST 14  0 - 37 U/L   ALT 23  0 - 35 U/L   Alkaline Phosphatase 78  39 - 117 U/L   Total Bilirubin 1.2  0.3 - 1.2 mg/dL   GFR calc non Af Amer >90  >90 mL/min   GFR calc Af Amer >90  >90 mL/min   Comment: (NOTE)     The  eGFR has been calculated using the CKD EPI equation.     This calculation has not been validated in all clinical situations.     eGFR's persistently <90 mL/min signify possible Chronic Kidney     Disease.  GLUCOSE, CAPILLARY     Status: None   Collection Time    07/10/13  7:17 AM      Result Value Ref Range   Glucose-Capillary 92  70 - 99 mg/dL  GLUCOSE, CAPILLARY     Status: Abnormal   Collection Time    07/10/13 11:46 AM      Result Value Ref Range   Glucose-Capillary 108 (*) 70 - 99 mg/dL  GLUCOSE, CAPILLARY     Status: Abnormal   Collection Time    07/10/13  4:55 PM      Result Value Ref Range   Glucose-Capillary 143 (*) 70 - 99 mg/dL  GLUCOSE, CAPILLARY     Status: Abnormal   Collection Time    07/10/13  9:35 PM      Result Value Ref Range   Glucose-Capillary 117 (*) 70 - 99 mg/dL  GLUCOSE, CAPILLARY     Status: None   Collection Time    07/11/13  7:20 AM      Result Value Ref Range   Glucose-Capillary 93  70 - 99 mg/dL   Comment 1 Notify RN    GLUCOSE, CAPILLARY     Status: Abnormal   Collection Time    07/11/13 11:32 AM      Result Value Ref Range   Glucose-Capillary 112 (*) 70 - 99 mg/dL   Comment 1 Notify RN    GLUCOSE, CAPILLARY     Status: Abnormal   Collection Time    07/11/13  4:33 PM  Result Value Ref Range   Glucose-Capillary 122 (*) 70 - 99 mg/dL   Comment 1 Notify RN    GLUCOSE, CAPILLARY     Status: Abnormal   Collection Time    07/11/13  8:45 PM      Result Value Ref Range   Glucose-Capillary 144 (*) 70 - 99 mg/dL   Comment 1 Notify RN       HEENT: normal Cardio: RRR and no murmurs Resp: CTA B/L GI: BS positive and NT,ND Extremity:  No Edema Skin:   Intact Neuro:   more alert today. Delayed processing, Abnormal Sensory decreaswed sensation in both hands and feet to LT, Abnormal Motor 4- Left delt otherwise 4/5 in BUE, 3/5 BLE, Abnormal FMC Ataxic/ dec FMC and Tone  Hypertonia and Dysarthric Musc/Skel:  Other no pain with AAROM of  limbs Gen NAD Psych: flat   Assessment/Plan: 1. Functional deficits secondary to MS exacerbation which require 3+ hours per day of interdisciplinary therapy in a comprehensive inpatient rehab setting. Physiatrist is providing close team supervision and 24 hour management of active medical problems listed below. Physiatrist and rehab team continue to assess barriers to discharge/monitor patient progress toward functional and medical goals. FIM: FIM - Bathing Bathing Steps Patient Completed:  (she indicated she bathed last night) Bathing: 0: Activity did not occur  FIM - Upper Body Dressing/Undressing Upper body dressing/undressing: 1: Total-Patient completed less than 25% of tasks FIM - Lower Body Dressing/Undressing Lower body dressing/undressing: 1: Total-Patient completed less than 25% of tasks  FIM - Toileting Toileting steps completed by patient: Performs perineal hygiene Toileting: 1: Two helpers  FIM - Radio producer Devices: Recruitment consultant Transfers: 1-Two helpers  FIM - Control and instrumentation engineer Devices: Bed rails;Arm rests Bed/Chair Transfer: 1: Supine > Sit: Total A (helper does all/Pt. < 25%);1: Sit > Supine: Total A (helper does all/Pt. < 25%);2: Bed > Chair or W/C: Max A (lift and lower assist);2: Chair or W/C > Bed: Max A (lift and lower assist)  FIM - Locomotion: Wheelchair Locomotion: Wheelchair: 0: Activity did not occur FIM - Locomotion: Ambulation Locomotion: Ambulation: 0: Activity did not occur  Comprehension Comprehension Mode: Auditory Comprehension: 5-Follows basic conversation/direction: With no assist  Expression Expression Mode: Verbal Expression: 5-Expresses basic 90% of the time/requires cueing < 10% of the time.  Social Interaction Social Interaction: 4-Interacts appropriately 75 - 89% of the time - Needs redirection for appropriate language or to initiate interaction.  Problem  Solving Problem Solving: 3-Solves basic 50 - 74% of the time/requires cueing 25 - 49% of the time  Memory Memory: 2-Recognizes or recalls 25 - 49% of the time/requires cueing 51 - 75% of the time  Medical Problem List and Plan:  1. Functional deficits secondary to MS exacerbation.Solumedrol 5 day course completed  2. DVT Prophylaxis/Anticoagulation: Subcutaneous Lovenox.Monitor platelet count any signs of bleeding  3. Pain Management: tylenol as needed  4. Mood: Wellbutrin 150 mg daily. Followup per psychiatry services.provide emotional support  5. Neuropsych: This patient is not capable of making decisions on her own behalf.  6.Hypertension.Norvasc 5 mg daily,Lopressor 25 mg twice daily. bp's acceptably elevated at present 7.Diabetes Mellitus with peripheral neuropathy. Latest hemoglobin A1c 5.2.   -Patient on Glucophage 500 mg daily prior to admission.  -no meds at present  -reasonable control  LOS (Days) 3 A FACE TO FACE EVALUATION WAS PERFORMED  SWARTZ,ZACHARY T 07/12/2013, 7:55 AM

## 2013-07-13 ENCOUNTER — Inpatient Hospital Stay (HOSPITAL_COMMUNITY): Payer: Self-pay | Admitting: Physical Therapy

## 2013-07-13 ENCOUNTER — Inpatient Hospital Stay (HOSPITAL_COMMUNITY): Payer: Self-pay | Admitting: Speech Pathology

## 2013-07-13 ENCOUNTER — Encounter (HOSPITAL_COMMUNITY): Payer: Self-pay | Admitting: Occupational Therapy

## 2013-07-13 DIAGNOSIS — G35 Multiple sclerosis: Secondary | ICD-10-CM

## 2013-07-13 DIAGNOSIS — G8250 Quadriplegia, unspecified: Secondary | ICD-10-CM

## 2013-07-13 LAB — GLUCOSE, CAPILLARY
Glucose-Capillary: 99 mg/dL (ref 70–99)
Glucose-Capillary: 118 mg/dL — ABNORMAL HIGH (ref 70–99)
Glucose-Capillary: 115 mg/dL — ABNORMAL HIGH (ref 70–99)
Glucose-Capillary: 114 mg/dL — ABNORMAL HIGH (ref 70–99)

## 2013-07-13 NOTE — Progress Notes (Signed)
[name] [name] is a 43 y.o. right-handed female with history of hypertension, diabetes mellitus with peripheral neuropathy as well as multiple sclerosis diagnosed 2007. Patient lives with her son and used a [employer] workercane/walker prior to admission. Presented 07/03/2013 with increasing weakness over the last 3-4 days as well as mild nausea with vomiting. There was report of a witnessed fall by her son striking her face without loss of consciousness. Cranial CT scan with no acute intracranial abnormalities. Severe changes of small vessel disease of the white matter consistent with multiple sclerosis. Placed on intravenous Solu-Medrol x5 days for suspect MS exacerbation completed 07/08/2013. Psychiatry was consulted in relation to depression placed on Wellbutrin  Subjective/Complaints:  Doesn't remember wme Oriented to rehab  Review of Systems - limited by cognition  Objective: Vital Signs: Blood pressure 126/87, pulse 59, temperature 97.9 F (36.6 C), temperature source Oral, resp. rate 17, height 5\' 1"  (1.549 m), weight 57.8 kg (127 lb 6.8 oz), last menstrual period 08/23/2012, SpO2 100.00%. No results found. Results for orders placed during the hospital encounter of 07/09/13 (from the past 72 hour(s))  GLUCOSE, CAPILLARY     Status: None   Collection Time    07/10/13  7:17 AM      Result Value Ref Range   Glucose-Capillary 92  70 - 99 mg/dL  GLUCOSE, CAPILLARY     Status: Abnormal   Collection Time    07/10/13 11:46 AM      Result Value Ref Range   Glucose-Capillary 108 (*) 70 - 99 mg/dL  GLUCOSE, CAPILLARY     Status: Abnormal   Collection Time    07/10/13  4:55 PM      Result Value Ref Range   Glucose-Capillary 143 (*) 70 - 99 mg/dL  GLUCOSE, CAPILLARY     Status: Abnormal   Collection Time    07/10/13  9:35 PM      Result Value Ref Range   Glucose-Capillary 117 (*) 70 - 99 mg/dL  GLUCOSE, CAPILLARY     Status: None   Collection Time    07/11/13  7:20 AM      Result Value Ref Range    Glucose-Capillary 93  70 - 99 mg/dL   Comment 1 Notify RN    GLUCOSE, CAPILLARY     Status: Abnormal   Collection Time    07/11/13 11:32 AM      Result Value Ref Range   Glucose-Capillary 112 (*) 70 - 99 mg/dL   Comment 1 Notify RN    GLUCOSE, CAPILLARY     Status: Abnormal   Collection Time    07/11/13  4:33 PM      Result Value Ref Range   Glucose-Capillary 122 (*) 70 - 99 mg/dL   Comment 1 Notify RN    GLUCOSE, CAPILLARY     Status: Abnormal   Collection Time    07/11/13  8:45 PM      Result Value Ref Range   Glucose-Capillary 144 (*) 70 - 99 mg/dL   Comment 1 Notify RN    GLUCOSE, CAPILLARY     Status: Abnormal   Collection Time    07/12/13  8:07 AM      Result Value Ref Range   Glucose-Capillary 100 (*) 70 - 99 mg/dL   Comment 1 Notify RN     Comment 2 Documented in Chart    GLUCOSE, CAPILLARY     Status: Abnormal   Collection Time    07/12/13 11:21 AM  Result Value Ref Range   Glucose-Capillary 118 (*) 70 - 99 mg/dL   Comment 1 Notify RN     Comment 2 Documented in Chart    GLUCOSE, CAPILLARY     Status: None   Collection Time    07/12/13  5:19 PM      Result Value Ref Range   Glucose-Capillary 98  70 - 99 mg/dL   Comment 1 Documented in Chart    GLUCOSE, CAPILLARY     Status: Abnormal   Collection Time    07/12/13  8:55 PM      Result Value Ref Range   Glucose-Capillary 144 (*) 70 - 99 mg/dL   Comment 1 Notify RN       HEENT: normal Cardio: RRR and no murmurs Resp: CTA B/L GI: BS positive and NT,ND Extremity:  No Edema Skin:   Intact Neuro:   more alert today. Delayed processing, Abnormal Sensory decreaswed sensation in both hands and feet to LT, Abnormal Motor 4- Left delt otherwise 4/5 in BUE, 3/5 BLE, Abnormal FMC Ataxic/ dec FMC and Tone  Hypertonia and Dysarthric Musc/Skel:  Other no pain with AAROM of limbs Gen NAD Psych: flat   Assessment/Plan: 1. Functional deficits secondary to MS exacerbation which require 3+ hours per day of  interdisciplinary therapy in a comprehensive inpatient rehab setting. Physiatrist is providing close team supervision and 24 hour management of active medical problems listed below. Physiatrist and rehab team continue to assess barriers to discharge/monitor patient progress toward functional and medical goals. FIM: FIM - Bathing Bathing Steps Patient Completed:  (she indicated she bathed last night) Bathing: 0: Activity did not occur  FIM - Upper Body Dressing/Undressing Upper body dressing/undressing: 1: Total-Patient completed less than 25% of tasks FIM - Lower Body Dressing/Undressing Lower body dressing/undressing: 1: Total-Patient completed less than 25% of tasks  FIM - Toileting Toileting steps completed by patient: Performs perineal hygiene Toileting: 1: Two helpers  FIM - Diplomatic Services operational officer Devices: Psychiatrist Transfers: 1-Two helpers  FIM - Banker Devices: Bed rails;Arm rests Bed/Chair Transfer: 1: Supine > Sit: Total A (helper does all/Pt. < 25%);1: Sit > Supine: Total A (helper does all/Pt. < 25%);2: Bed > Chair or W/C: Max A (lift and lower assist);2: Chair or W/C > Bed: Max A (lift and lower assist)  FIM - Locomotion: Wheelchair Locomotion: Wheelchair: 0: Activity did not occur FIM - Locomotion: Ambulation Locomotion: Ambulation: 0: Activity did not occur  Comprehension Comprehension Mode: Auditory Comprehension: 5-Follows basic conversation/direction: With no assist  Expression Expression Mode: Verbal Expression: 5-Expresses basic 90% of the time/requires cueing < 10% of the time.  Social Interaction Social Interaction: 4-Interacts appropriately 75 - 89% of the time - Needs redirection for appropriate language or to initiate interaction.  Problem Solving Problem Solving: 3-Solves basic 50 - 74% of the time/requires cueing 25 - 49% of the time  Memory Memory: 2-Recognizes or recalls  25 - 49% of the time/requires cueing 51 - 75% of the time  Medical Problem List and Plan:  1. Functional deficits secondary to MS exacerbation.Solumedrol 5 day course completed  2. DVT Prophylaxis/Anticoagulation: Subcutaneous Lovenox.Monitor platelet count any signs of bleeding  3. Pain Management: tylenol as needed  4. Mood: Wellbutrin 150 mg daily. Followup per psychiatry services.provide emotional support  5. Neuropsych: This patient is not capable of making decisions on her own behalf.  6.Hypertension.Norvasc 5 mg daily,Lopressor 25 mg twice daily. bp's acceptably elevated at present  7.Diabetes Mellitus with peripheral neuropathy. Latest hemoglobin A1c 5.2.   -Patient on Glucophage 500 mg daily prior to admission.  -no meds at present  -reasonable control  LOS (Days) 4 A FACE TO FACE EVALUATION WAS PERFORMED  KIRSTEINS,ANDREW E 07/13/2013, 7:01 AM

## 2013-07-13 NOTE — Progress Notes (Signed)
Physical Therapy Session Note  Patient Details  Name: [name] [name] MRN: [id number] Date of Birth: 04/30/1970  Today's Date: 07/13/2013 Time: 1020-1050 and [id number] Time Calculation (min): 30 min and 55 min  Short Term Goals: Week 1:  PT Short Term Goal 1 (Week 1): Pt will perform supine<>sit with max A and 75% cueing for initiation and sequencing. PT Short Term Goal 2 (Week 1): Pt will perform bed<>w/c transfer with Max A and 75% cueing for initiation and sequencing. PT Short Term Goal 3 (Week 1): Pt will perform w/c mobility x20' in controlled environment with max A and 75% cueing. PT Short Term Goal 4 (Week 1): Pt will perform sit<>stand transfer from w/c with max A and 75% cueing.  Skilled Therapeutic Interventions/Progress Updates:    Treatment Session 1: Pt received semi-reclined in bed; agreeable to therapy. Oriented to self, place, and situation. Session focused on active participation with bed mobility, sitting balance, and functional transfers. Performed supine>sit with mod A for bilat LE management, increased time with HOB elevated using bed rail. Seated EOB, pt required Total A for sitting balance, secondary to pt tendency to push trunk posteriorly. Pt reporting "vertigo", describing "spinning" with supine>sit transfer (to R side). Pt unaware of what causes spinning. Will recommend vestibular evaluation.  Performed lateral scooting transfer to L side with max A, increased time, max multimodal cueing for anterior weight shifting. Pt requires frequent cueing to maintain anterior weight shift secondary to tendency to push trunk posteriorly. In seated, pt continuing to push posteriorly, causing hips to slide forward in w/c seat. Max multimodal cueing and increased time required for technique, sequencing with posterior scooting in chair. Pt with improved participation, as compared in PT evaluation; however, continues to exhibit decreased initiation and delayed processing. Therapist  departed with pt seated in w/c with quick release belt on for safety and all needs within reach.  Treatment Session 2: Pt received seated in w/c; agreeable to therapy. Session focused on initiating standing, gait training; continued to focus on facilitating active pt participation in functional mobility. Pt performed w/c mobility x20' in controlled environment with bilat UE's and max A (HOH assist at L hand), max encouragement, and increased time. Transported pt remaining distance in gym in w/c with Total A for energy conservation. Pt performed sit<>stand x5 reps from w/c with bilat UE support at parallel bars requiring supervision to min guard. Once standing, pt consistently demonstrated significant flexion of trunk/bilat hips, then insisted on sitting back down. Pt refusing to attempt gait. Again discussed the importance of active participation in therapies, emphasizing pt need to negotiate 13 steps to enter home. To this, pt stated, "My son will just carry me up the steps like he always has."   Able to coax pt to attempt gait if another staff member is present to assist. With additional staff present, pt performed sit>stand from w/c with min A, then requested to sit down secondary to sudden urge to urinate. Upon sitting, pt reports no longer needing to use bathroom. Transported pt back to room in w/c, where pt performed squat pivot from w/c>bed with max A. Pt requesting that this therapist [facility] to [facility] fatigue. PT again educated pt on importance of active participaton in therapies. PT instructed pt on technique [facility]; provided tactile cueing at R hand for placement at bed rail, tactile cueing at bilat knees to facilitate weightbearing with [facility], which pt was able to perform with ~10 attempts and  max encouragement. Therapist departed with pt semi-reclined in bed with 3 bed rails up, bed alarm on, and all needs within reach.  Therapy Documentation Precautions:   Precautions Precautions: Fall Restrictions Weight Bearing Restrictions: No General:   Vital Signs: Therapy Vitals Pulse Rate: 70 BP: 128/86 mmHg Pain: Pain Assessment Pain Assessment: No/denies pain Pain Score: 0-No pain  See FIM for current functional status  Therapy/Group: Individual Therapy  Hobble, Lorenda Ishihara 07/13/2013, 12:28 PM

## 2013-07-13 NOTE — Progress Notes (Signed)
Occupational Therapy Session Note  Patient Details  Name: [name] [name] MRN: [id number] Date of Birth: 02/11/1970  Today's Date: 07/13/2013 Time: 1115-1200 Time Calculation (min): 45 min  Short Term Goals: Week 1:  OT Short Term Goal 1 (Week 1): Pt. will feed self with mod I OT Short Term Goal 2 (Week 1): Pt will bathe with mod assist OT Short Term Goal 3 (Week 1): Pt. will dress UB with minimal assist OT Short Term Goal 4 (Week 1): Pt. will dress LB with mod assist OT Short Term Goal 5 (Week 1): Pt will transfer to [facility] with mod assist  Skilled Therapeutic Interventions/Progress Updates:    Patient seen this am for OT intervention.  Patient with flat affect, but did engage in brief verbal exchanges with this therapist.  Patient much more talkative, and even joking with son, Fritzi Mandes, when he arrived.  Patient with left sided weakness more pronounced than right side, and per son more evident than before this admission.  Patient reports having assistance for bathing prior to this admission.  Aide visited every other day to wash her up.  Patient without report of dizziness during this treatment session, washing at sink.    Therapy Documentation Precautions:  Precautions Precautions: Fall Restrictions Weight Bearing Restrictions: No  Pain: Pain Assessment Pain Assessment: No/denies pain  See FIM for current functional status  Therapy/Group: Individual Therapy  Collier Salina 07/13/2013, 3:58 PM

## 2013-07-13 NOTE — Progress Notes (Signed)
Speech Language Pathology Daily Session Note  Patient Details  Name: [name] [name] MRN: [id number] Date of Birth: Aug 14, 1970  Today's Date: 07/13/2013 Time: 2761-4709 Time Calculation (min): 58 min  Short Term Goals: Week 1: SLP Short Term Goal 1 (Week 1): Pt will improve short term memory for recall of daily events and information for 75% accuracy with mod assist  SLP Short Term Goal 2 (Week 1): Pt will improve executive function for sequencing, organization, and error awareness during basic tasks for 75% accuracy with mod assist.  SLP Short Term Goal 3 (Week 1): Pt will tolerate her currently prescribed diet with no overt s/s of aspiration with modified independence  SLP Short Term Goal 4 (Week 1): Pt will selectively attend to functional tasks in a minimally distracting environment for 7-10 minutes with min cuing for redirection.   SLP Short Term Goal 5 (Week 1): Pt will improve her speech intelligibility to 80% accurate at the conversational level with min cuing for use of compensatory strategies.    Skilled Therapeutic Interventions:  Pt was seen for skilled speech therapy targeting speech intelligibility, recall, and executive function.  Pt was awake, alert, and motivated to participate in structured therapeutic tasks.  SLP initiated skilled education related to dysarthria strategies including slow rate of speech, increased volume, overarticulation, and pausing between words.  Pt immediately recalled 3/4 compensatory strategies, improving to 4/4 with min assist.  SLP engaged pt in a structured task following skilled strategy instruction to target delayed recall and carryover of education within a session.  During the aforementioned task, pt benefited from intermittent min-mod assist cues for working memory, error awareness, and thought organization.  Improvements for processing speed, executive function, and working memory within the task were most notable with increased repetition of  trials and pt was able to selectively attend to task for 3-5 minutes at a time before requiring min cuing for redirection.  Suspect the repetitive nature of the task facilitated the pt's improved functional independence as the activity became more automatic in nature.  After completion of the task, pt recalled 1/4 dysarthria strategies which were targeted at the beginning of the session, improving to 3/4 recall with mod assist verbal cues with written visual aids.  Continue per current plan of care.    FIM:  Comprehension Comprehension Mode: Auditory Comprehension: 5-Follows basic conversation/direction: With extra time/assistive device Expression Expression Mode: Verbal Expression: 4-Expresses basic 75 - 89% of the time/requires cueing 10 - 24% of the time. Needs helper to occlude trach/needs to repeat words. Social Interaction Social Interaction: 4-Interacts appropriately 75 - 89% of the time - Needs redirection for appropriate language or to initiate interaction. Problem Solving Problem Solving: 3-Solves basic 50 - 74% of the time/requires cueing 25 - 49% of the time Memory Memory: 2-Recognizes or recalls 25 - 49% of the time/requires cueing 51 - 75% of the time  Pain Pain Assessment Pain Assessment: No/denies pain Pain Score: 0-No pain  Therapy/Group: Individual Therapy  Jackalyn Lombard, M.A. CCC-SLP  Page, Melanee Spry 07/13/2013, 12:55 PM

## 2013-07-14 ENCOUNTER — Encounter (HOSPITAL_COMMUNITY): Payer: Self-pay | Admitting: Occupational Therapy

## 2013-07-14 ENCOUNTER — Inpatient Hospital Stay (HOSPITAL_COMMUNITY): Payer: Self-pay | Admitting: Speech Pathology

## 2013-07-14 ENCOUNTER — Inpatient Hospital Stay (HOSPITAL_COMMUNITY): Payer: Self-pay | Admitting: Occupational Therapy

## 2013-07-14 ENCOUNTER — Inpatient Hospital Stay (HOSPITAL_COMMUNITY): Payer: Self-pay | Admitting: Physical Therapy

## 2013-07-14 DIAGNOSIS — G8250 Quadriplegia, unspecified: Secondary | ICD-10-CM

## 2013-07-14 DIAGNOSIS — G35 Multiple sclerosis: Secondary | ICD-10-CM

## 2013-07-14 LAB — URINALYSIS, ROUTINE W REFLEX MICROSCOPIC
BILIRUBIN URINE: NEGATIVE
Glucose, UA: NEGATIVE mg/dL
Hgb urine dipstick: NEGATIVE
KETONES UR: NEGATIVE mg/dL
Nitrite: NEGATIVE
PROTEIN: NEGATIVE mg/dL
SPECIFIC GRAVITY, URINE: 1.016 (ref 1.005–1.030)
UROBILINOGEN UA: 1 mg/dL (ref 0.0–1.0)
pH: 7 (ref 5.0–8.0)

## 2013-07-14 LAB — URINE MICROSCOPIC-ADD ON

## 2013-07-14 LAB — GLUCOSE, CAPILLARY
Glucose-Capillary: 91 mg/dL (ref 70–99)
Glucose-Capillary: 106 mg/dL — ABNORMAL HIGH (ref 70–99)
Glucose-Capillary: 147 mg/dL — ABNORMAL HIGH (ref 70–99)
Glucose-Capillary: 133 mg/dL — ABNORMAL HIGH (ref 70–99)

## 2013-07-14 MED ORDER — ACETAMINOPHEN 325 MG PO TABS
325.0000 mg | ORAL_TABLET | ORAL | Status: DC | PRN
  Administered 2013-07-14 – 2013-07-23 (×4): 650 mg via ORAL
  Filled 2013-07-14 (×5): qty 2

## 2013-07-14 NOTE — Plan of Care (Signed)
Problem: RH Balance Goal: LTG Patient will maintain dynamic sitting balance (PT) LTG: Patient will maintain dynamic sitting balance with assistance during mobility activities (PT)  Outcome: Not Applicable Date Met:  91/66/06 N/A secondary to pt request to discharge at w/c level.  Problem: RH Ambulation Goal: LTG Patient will ambulate in controlled environment (PT) LTG: Patient will ambulate in a controlled environment, # of feet with assistance (PT).  Outcome: Not Applicable Date Met:  00/45/99 N/A secondary to pt request to discharge at w/c level. Goal: LTG Patient will ambulate in home environment (PT) LTG: Patient will ambulate in home environment, # of feet with assistance (PT).  Outcome: Not Applicable Date Met:  77/41/42 N/A secondary to plan for pt to D/C at w/c level.  Problem: RH Stairs Goal: LTG Patient will ambulate up and down stairs w/assist (PT) LTG: Patient will ambulate up and down # of stairs with assistance (PT)  Outcome: Not Applicable Date Met:  39/53/20 N/A secondary to plan for pt to discharge at w/c level.

## 2013-07-14 NOTE — Progress Notes (Signed)
Patient complaining of new onset neck pain and states that her neck feels sore.  [name] Ina, PA notified; new order received for K-pad and tylenol PRN.  Will continue to monitor.

## 2013-07-14 NOTE — Progress Notes (Signed)
Per therapist report, patient had urinary urgency for the past two days.  Per patient's son, the patient's current symptoms of urgency and difficulty starting stream are the same symptoms she had when she has had bladder infections in the past.  Deatra Ina, PA notified; new order received.  Will continue to monitor.

## 2013-07-14 NOTE — Progress Notes (Signed)
Occupational Therapy Session Note  Patient Details  Name: [name] [name] MRN: [id number] Date of Birth: 1970/07/16  Today's Date: 07/14/2013 Time: 1308-6578 Time Calculation (min): 57 min  Short Term Goals: Week 1:  OT Short Term Goal 1 (Week 1): Pt. will feed self with mod I OT Short Term Goal 2 (Week 1): Pt will bathe with mod assist OT Short Term Goal 3 (Week 1): Pt. will dress UB with minimal assist OT Short Term Goal 4 (Week 1): Pt. will dress LB with mod assist OT Short Term Goal 5 (Week 1): Pt will transfer to [facility] with mod assist  Skilled Therapeutic Interventions/Progress Updates:    Patient seen this am for OT intervention to address LE strength, sustained motor activity in bilateral LE's, stand tolerance, and initiation of self care activities.  Patient reporting need to void, and took 10 min to void once on commode.  Spoke with RN about family's concerns of possible UTI - patient has had difficulty with urinary retention in the past.  Patient with limited ability to weight shift forward in sitting, and actually pushes herself backward in chair, which gives her the sensation that she is sliding out of her wheelchair.   Therapy Documentation Precautions:  Precautions Precautions: Fall Restrictions Weight Bearing Restrictions: No   Pain: Pain Assessment Pain Assessment: No/denies pain Pain Score: 0-No pain  See FIM for current functional status  Therapy/Group: Individual Therapy  Collier Salina 07/14/2013, 11:14 AM

## 2013-07-14 NOTE — Progress Notes (Addendum)
Physical Therapy Session Note  Patient Details  Name: [name] [name] MRN: [id number] Date of Birth: 07/04/1970  Today's Date: 07/14/2013 Time: 1120-1210 Time Calculation (min): 50 min  Short Term Goals: Week 1:  PT Short Term Goal 1 (Week 1): Pt will perform supine<>sit with max A and 75% cueing for initiation and sequencing. PT Short Term Goal 2 (Week 1): Pt will perform bed<>w/c transfer with Max A and 75% cueing for initiation and sequencing. PT Short Term Goal 3 (Week 1): Pt will perform w/c mobility x20' in controlled environment with max A and 75% cueing. PT Short Term Goal 4 (Week 1): Pt will perform sit<>stand transfer from w/c with max A and 75% cueing.  Skilled Therapeutic Interventions/Progress Updates:    Pt received seated in w/c with quick release belt on, son present. Pt agreeable to therapy. Interactive discussion between patient, son, and this PT concerning planned means of functional mobility at D/C. Educated pt on choice between discharging at ambulatory vs w/c level; if pt planning on being ambulatory, pt must participate in standing and ambulation during PT and OT sessions. Pt agreeable to attempting gait. ACE bandages donned at bilat LE's for ankle dorsiflexion assist. Pt performed gait x2' in controlled environment with rolling walker, max encouragement, increased time, and +2A for w/c follow; pt performed ~25% of gait. Gait characterized by flexion of bilat hips, knees, and trunk with ineffective return demonstration of cueing for extension/upright posture. With max encouragement, attempted another gait trial; however, pt unable to initiate gait and repositioned self in w/c without warning. Following attempted gait trials, pt requests to plan on discharging at w/c level. Pt, son, and this therapist in agreement of plan. CSW LawrenceBecky and OT notified. Retrieved alternative w/c (16"x16" hemi height) to facilitate pt independence with AP/PA scooting in chair.   Also discussed  home setup (13 stairs to enter) with patient and son. Based on current functional level and pt plan to D/C at w/c level, explained that it is not plausible to expect pt will be able to negotiate 13 steps to enter home. Son verbalized understanding. CSW AripekaBecky notified of session and discussions with pt and son. Therapist departed with pt seated in w/c with son present and quick release belt on for safety.  Addendum: Long term goals addressing standing, gait, and stair negotiation discharged secondary to plan for pt to D/C at w/c level.  Therapy Documentation Precautions:  Precautions Precautions: Fall Restrictions Weight Bearing Restrictions: No Pain: Pain Assessment Pain Assessment: No/denies pain Locomotion : Ambulation Ambulation/Gait Assistance: 1: +2 Total assist (+2A for w/c follow)   See FIM for current functional status  Therapy/Group: Individual Therapy  Hobble, Lorenda IshiharaBlair A 07/14/2013, 12:42 PM

## 2013-07-14 NOTE — Progress Notes (Signed)
Occupational Therapy Session Note  Patient Details  Name: [name] [name] MRN: [id number] Date of Birth: October 29, 1970  Today's Date: 07/14/2013 Time: 1430-1500 Time Calculation (min): 30 min  Short Term Goals: Week 1:  OT Short Term Goal 1 (Week 1): Pt. will feed self with mod I OT Short Term Goal 2 (Week 1): Pt will bathe with mod assist OT Short Term Goal 3 (Week 1): Pt. will dress UB with minimal assist OT Short Term Goal 4 (Week 1): Pt. will dress LB with mod assist OT Short Term Goal 5 (Week 1): Pt will transfer to [facility] with mod assist  Skilled Therapeutic Interventions/Progress Updates:    Patient seen this pm to address level surface transfers as needed for toilet and tub.  Patient very slow to change positions, yet able to shift trunk weight forward over feet to lift off surface with mod cueing.  With increased time and very clear/ simple verbal instruction, patient able to stand step to new surface with min assist.  Patient consistently needing cues to manage forward weight shift.  Patient extremely fearful of falling.  Patient limited in her ability to roll onto left side, stating "I have vertigo" and demonstrating increased muscle tone, inability to isolate joint motion throughout body.  Patient with report of neck pain - which also may be related to inability to isolate joint motion and overactive neck and shoulder muscle activity to guard head movement.  Awaiting vestibular eval.    Therapy Documentation Precautions:  Precautions Precautions: Fall Restrictions Weight Bearing Restrictions: No  Pain: Reporting neck pain and stiffness.  RN had recently given Tylenol for pain  See FIM for current functional status  Therapy/Group: Individual Therapy  Collier Salina 07/14/2013, 4:19 PM

## 2013-07-14 NOTE — Progress Notes (Signed)
Speech Language Pathology Daily Session Note  Patient Details  Name: [name] [name] MRN: [id number] Date of Birth: 10-06-1970  Today's Date: 07/14/2013 Time: 0816-0920 Time Calculation (min): 64 min  Short Term Goals: Week 1: SLP Short Term Goal 1 (Week 1): Pt will improve short term memory for recall of daily events and information for 75% accuracy with mod assist  SLP Short Term Goal 2 (Week 1): Pt will improve executive function for sequencing, organization, and error awareness during basic tasks for 75% accuracy with mod assist.  SLP Short Term Goal 3 (Week 1): Pt will tolerate her currently prescribed diet with no overt s/s of aspiration with modified independence  SLP Short Term Goal 4 (Week 1): Pt will selectively attend to functional tasks in a minimally distracting environment for 7-10 minutes with min cuing for redirection.   SLP Short Term Goal 5 (Week 1): Pt will improve her speech intelligibility to 80% accurate at the conversational level with min cuing for use of compensatory strategies.    Skilled Therapeutic Interventions:  Pt was seen for skilled speech therapy targeting short term memory, executive function, and selective attention.  Upon arrival pt was seated upright in bed finishing breakfast.  No overt s/s of aspiration noted with sips via straw of thin liquids.  Pt was agreeable to get up out of bed for therapy to facilitate improved alertness and processing speed for functional tasks.  SLP provided pt with set up of grooming items such as toothpaste, toothbrush, wash cloth, and towel to complete basic sinkside ADLs to target functional problem solving.  Pt required min assist verbal cues to initiate and complete task in a timely manner as well as recognition of basic problems such as wiping mouth, putting down one item to pick another item up, and leaning forward in wheelchair to reach needed items.  Upon completion of basic self care tasks, SLP initiated trials of  structured tasks in a moderately distracting environment outside of the room to target selective attention.  Pt was 100% accurate for sorting coins and calculating values with supervision verbal cues; min assist for identifying safety issues in pictures, mod-max assist to generate solutions to problems in pictures due to decreased mental flexibility.  Pt recalled card game addressed in previous therapy session with supervision question cues and requested to incorporate more card games into therapy.  She required max assist for working memory and problem solving during a new card game and encouragement for participation in the setting of increased fatigue.  Continue per current plan of care.    FIM:  Comprehension Comprehension Mode: Auditory Comprehension: 4-Understands basic 75 - 89% of the time/requires cueing 10 - 24% of the time Expression Expression Mode: Verbal Expression: 4-Expresses basic 75 - 89% of the time/requires cueing 10 - 24% of the time. Needs helper to occlude trach/needs to repeat words. Social Interaction Social Interaction: 3-Interacts appropriately 50 - 74% of the time - May be physically or verbally inappropriate. Problem Solving Problem Solving: 2-Solves basic 25 - 49% of the time - needs direction more than half the time to initiate, plan or complete simple activities Memory Memory: 2-Recognizes or recalls 25 - 49% of the time/requires cueing 51 - 75% of the time  Pain Pain Assessment Pain Assessment: No/denies pain  Therapy/Group: Individual Therapy  Jackalyn Lombard, M.A. CCC-SLP  Page, Melanee Spry 07/14/2013, 9:31 AM

## 2013-07-14 NOTE — Progress Notes (Signed)
[name] [name] is a 43 y.o. right-handed female with history of hypertension, diabetes mellitus with peripheral neuropathy as well as multiple sclerosis diagnosed 2007. Patient lives with her son and used a Youth worker prior to admission. Presented 07/03/2013 with increasing weakness over the last 3-4 days as well as mild nausea with vomiting. There was report of a witnessed fall by her son striking her face without loss of consciousness. Cranial CT scan with no acute intracranial abnormalities. Severe changes of small vessel disease of the white matter consistent with multiple sclerosis. Placed on intravenous Solu-Medrol x5 days for suspect MS exacerbation completed 07/08/2013. Psychiatry was consulted in relation to depression placed on Wellbutrin  Subjective/Complaints:   Oriented to rehab  Review of Systems - limited by cognition  Objective: Vital Signs: Blood pressure 115/80, pulse 59, temperature 97.5 F (36.4 C), temperature source Oral, resp. rate 18, height 5\' 1"  (1.549 m), weight 57.8 kg (127 lb 6.8 oz), last menstrual period 08/23/2012, SpO2 100.00%. No results found. Results for orders placed during the hospital encounter of 07/09/13 (from the past 72 hour(s))  GLUCOSE, CAPILLARY     Status: None   Collection Time    07/11/13  7:20 AM      Result Value Ref Range   Glucose-Capillary 93  70 - 99 mg/dL   Comment 1 Notify RN    GLUCOSE, CAPILLARY     Status: Abnormal   Collection Time    07/11/13 11:32 AM      Result Value Ref Range   Glucose-Capillary 112 (*) 70 - 99 mg/dL   Comment 1 Notify RN    GLUCOSE, CAPILLARY     Status: Abnormal   Collection Time    07/11/13  4:33 PM      Result Value Ref Range   Glucose-Capillary 122 (*) 70 - 99 mg/dL   Comment 1 Notify RN    GLUCOSE, CAPILLARY     Status: Abnormal   Collection Time    07/11/13  8:45 PM      Result Value Ref Range   Glucose-Capillary 144 (*) 70 - 99 mg/dL   Comment 1 Notify RN    GLUCOSE, CAPILLARY      Status: Abnormal   Collection Time    07/12/13  8:07 AM      Result Value Ref Range   Glucose-Capillary 100 (*) 70 - 99 mg/dL   Comment 1 Notify RN     Comment 2 Documented in Chart    GLUCOSE, CAPILLARY     Status: Abnormal   Collection Time    07/12/13 11:21 AM      Result Value Ref Range   Glucose-Capillary 118 (*) 70 - 99 mg/dL   Comment 1 Notify RN     Comment 2 Documented in Chart    GLUCOSE, CAPILLARY     Status: None   Collection Time    07/12/13  5:19 PM      Result Value Ref Range   Glucose-Capillary 98  70 - 99 mg/dL   Comment 1 Documented in Chart    GLUCOSE, CAPILLARY     Status: Abnormal   Collection Time    07/12/13  8:55 PM      Result Value Ref Range   Glucose-Capillary 144 (*) 70 - 99 mg/dL   Comment 1 Notify RN    GLUCOSE, CAPILLARY     Status: None   Collection Time    07/13/13  7:29 AM      Result Value Ref  Range   Glucose-Capillary 99  70 - 99 mg/dL   Comment 1 Notify RN    GLUCOSE, CAPILLARY     Status: Abnormal   Collection Time    07/13/13 11:16 AM      Result Value Ref Range   Glucose-Capillary 118 (*) 70 - 99 mg/dL   Comment 1 Notify RN    GLUCOSE, CAPILLARY     Status: Abnormal   Collection Time    07/13/13  4:41 PM      Result Value Ref Range   Glucose-Capillary 115 (*) 70 - 99 mg/dL  GLUCOSE, CAPILLARY     Status: Abnormal   Collection Time    07/13/13  9:45 PM      Result Value Ref Range   Glucose-Capillary 114 (*) 70 - 99 mg/dL     HEENT: normal Cardio: RRR and no murmurs Resp: CTA B/L GI: BS positive and NT,ND Extremity:  No Edema Skin:   Intact Neuro:   more alert today. Delayed processing, Abnormal Sensory decreaswed sensation in both hands and feet to LT, Abnormal Motor 4- Left delt bi, tri, grip, 4+ on Right sided, 3-/5 LLE, 4/5 RLE, Abnormal FMC Ataxic/ dec FMC and Tone  Hypertonia and Dysarthric Musc/Skel:  Other no pain with AAROM of limbs Gen NAD Psych: flat   Assessment/Plan: 1. Functional deficits secondary to  MS exacerbation which require 3+ hours per day of interdisciplinary therapy in a comprehensive inpatient rehab setting. Physiatrist is providing close team supervision and 24 hour management of active medical problems listed below. Physiatrist and rehab team continue to assess barriers to discharge/monitor patient progress toward functional and medical goals. FIM: FIM - Bathing Bathing Steps Patient Completed: Chest;Right Arm;Left Arm;Abdomen;Right upper leg;Left upper leg Bathing: 3: Mod-Patient completes 5-7 58f 10 parts or 50-74%  FIM - Upper Body Dressing/Undressing Upper body dressing/undressing steps patient completed: Thread/unthread right sleeve of pullover shirt/dresss;Thread/unthread left sleeve of pullover shirt/dress Upper body dressing/undressing: 3: Mod-Patient completed 50-74% of tasks FIM - Lower Body Dressing/Undressing Lower body dressing/undressing: 1: Total-Patient completed less than 25% of tasks  FIM - Toileting Toileting steps completed by patient: Performs perineal hygiene Toileting: 0: Activity did not occur  FIM - Diplomatic Services operational officer Devices: Psychiatrist Transfers: 1-Two helpers  FIM - Banker Devices: Arm rests;Bed rails;HOB elevated Bed/Chair Transfer: 3: Supine > Sit: Mod A (lifting assist/Pt. 50-74%/lift 2 legs;3: Bed > Chair or W/C: Mod A (lift or lower assist);2: Chair or W/C > Bed: Max A (lift and lower assist)  FIM - Locomotion: Wheelchair Distance: 20 Locomotion: Wheelchair: 1: Travels less than 50 ft with maximal assistance (Pt: 25 - 49%) FIM - Locomotion: Ambulation Locomotion: Ambulation: 0: Activity did not occur  Comprehension Comprehension Mode: Auditory Comprehension: 4-Understands basic 75 - 89% of the time/requires cueing 10 - 24% of the time  Expression Expression Mode: Verbal Expression: 3-Expresses basic 50 - 74% of the time/requires cueing 25 - 50% of the  time. Needs to repeat parts of sentences.  Social Interaction Social Interaction: 3-Interacts appropriately 50 - 74% of the time - May be physically or verbally inappropriate.  Problem Solving Problem Solving: 2-Solves basic 25 - 49% of the time - needs direction more than half the time to initiate, plan or complete simple activities  Memory Memory: 2-Recognizes or recalls 25 - 49% of the time/requires cueing 51 - 75% of the time  Medical Problem List and Plan:  1. Functional deficits secondary to MS  exacerbation.Solumedrol 5 day course completed  2. DVT Prophylaxis/Anticoagulation: Subcutaneous Lovenox.Monitor platelet count any signs of bleeding  3. Pain Management: tylenol as needed  4. Mood: Wellbutrin 150 mg daily. Followup per psychiatry services.provide emotional support  5. Neuropsych: This patient is not capable of making decisions on her own behalf.  6.Hypertension.Norvasc 5 mg daily,Lopressor 25 mg twice daily. bp's acceptably elevated at present 7.Diabetes Mellitus with peripheral neuropathy. Latest hemoglobin A1c 5.2.   -Patient on Glucophage 500 mg daily prior to admission.  -no meds at present  -reasonable control  LOS (Days) 5 A FACE TO FACE EVALUATION WAS PERFORMED  KIRSTEINS,ANDREW E 07/14/2013, 6:35 AM

## 2013-07-15 ENCOUNTER — Encounter (HOSPITAL_COMMUNITY): Payer: Medicare Other | Admitting: Occupational Therapy

## 2013-07-15 ENCOUNTER — Inpatient Hospital Stay (HOSPITAL_COMMUNITY): Payer: Self-pay | Admitting: Physical Therapy

## 2013-07-15 ENCOUNTER — Inpatient Hospital Stay (HOSPITAL_COMMUNITY): Payer: Self-pay | Admitting: Speech Pathology

## 2013-07-15 ENCOUNTER — Inpatient Hospital Stay (HOSPITAL_COMMUNITY): Payer: Medicare Other | Admitting: *Deleted

## 2013-07-15 LAB — URINE CULTURE: Colony Count: >=100000

## 2013-07-15 LAB — GLUCOSE, CAPILLARY
GLUCOSE-CAPILLARY: 115 mg/dL — AB (ref 70–99)
Glucose-Capillary: 96 mg/dL (ref 70–99)
Glucose-Capillary: 90 mg/dL (ref 70–99)
Glucose-Capillary: 124 mg/dL — ABNORMAL HIGH (ref 70–99)

## 2013-07-15 NOTE — Progress Notes (Signed)
Occupational Therapy Session Note  Patient Details  Name: [name] [name] MRN: [id number] Date of Birth: Jul 26, 1970  Today's Date: 07/15/2013 Time: 0930-1030 Time Calculation (min): 60 min  Short Term Goals: Week 1:  OT Short Term Goal 1 (Week 1): Pt. will feed self with mod I OT Short Term Goal 2 (Week 1): Pt will bathe with mod assist OT Short Term Goal 3 (Week 1): Pt. will dress UB with minimal assist OT Short Term Goal 4 (Week 1): Pt. will dress LB with mod assist OT Short Term Goal 5 (Week 1): Pt will transfer to [facility] with mod assist  Skilled Therapeutic Interventions/Progress Updates:   Patient seen this am for OT intervention to address increased participation in her own self care.  Patient lacks adequate postural control to sit at edge of bed without significant UE support, and strong right bias.   Patient is inactive with support of wheelchair. Patient with limited functional use of all 4 extremities, left side >right, limited postural control, and limited cognitive ability to be aware of how rehab process will help her improve above.  Patient able to bathe herself with less assistance at bed level, and actively participated versus directing tasks to be done.  Patient even initiated movement in a much more reasonable amount of time when safely supported by bed.  Patient may benefit from hospital bed at home to increase participation and therefore independence with ADL.    Therapy Documentation Precautions:  Precautions Precautions: Fall Restrictions Weight Bearing Restrictions: No   Pain: Pain Assessment Pain Assessment: No/denies pain Pain Score: 0-No pain  See FIM for current functional status  Therapy/Group: Individual Therapy  Collier Salina 07/15/2013, 3:59 PM

## 2013-07-15 NOTE — Progress Notes (Signed)
Speech Language Pathology Daily Session Note  Patient Details  Name: [name] [name] MRN: [id number] Date of Birth: Dec 22, 1970  Today's Date: 07/15/2013 Time: 1305-1405 Time Calculation (min): 60 min  Short Term Goals: Week 1: SLP Short Term Goal 1 (Week 1): Pt will improve short term memory for recall of daily events and information for 75% accuracy with mod assist  SLP Short Term Goal 2 (Week 1): Pt will improve executive function for sequencing, organization, and error awareness during basic tasks for 75% accuracy with mod assist.  SLP Short Term Goal 3 (Week 1): Pt will tolerate her currently prescribed diet with no overt s/s of aspiration with modified independence  SLP Short Term Goal 4 (Week 1): Pt will selectively attend to functional tasks in a minimally distracting environment for 7-10 minutes with min cuing for redirection.   SLP Short Term Goal 5 (Week 1): Pt will improve her speech intelligibility to 80% accurate at the conversational level with min cuing for use of compensatory strategies.    Skilled Therapeutic Interventions:  Pt was seen for skilled speech therapy targeting selective attention, memory, and executive function.  Pt recalled 1 detail from yesterday's structured new learning activity with max assist verbal cuing.  Pt required max visual and verbal cues for sequencing, organization, and error awareness during a basic card game and benefited from structured external aids for working memory to facilitate improved recall of task parameters.  Pt was able to recall 1 rule of the game after completing activity with max assist verbal cues.  Pt requested to use the bathroom at the end of the session and while SLP was setting up bedside commode, pt had forgotten that she had asked to go to the bathroom or that she had been working with this therapist for approximately 45 minutes and required max assist to reorient to therapist and situation.  Continue per current plan of care.     FIM:  Comprehension Comprehension Mode: Auditory Comprehension: 3-Understands basic 50 - 74% of the time/requires cueing 25 - 50%  of the time Expression Expression: 3-Expresses basic 50 - 74% of the time/requires cueing 25 - 50% of the time. Needs to repeat parts of sentences. Social Interaction Social Interaction: 3-Interacts appropriately 50 - 74% of the time - May be physically or verbally inappropriate. Problem Solving Problem Solving: 2-Solves basic 25 - 49% of the time - needs direction more than half the time to initiate, plan or complete simple activities Memory Memory: 1-Recognizes or recalls less than 25% of the time/requires cueing greater than 75% of the time  Pain Pain Assessment Pain Assessment: No/denies pain Pain Score: 0-No pain  Therapy/Group: Individual Therapy  Jackalyn Lombard, M.A. CCC-SLP  Page, Melanee Spry 07/15/2013, 4:28 PM

## 2013-07-15 NOTE — Progress Notes (Addendum)
Physical Therapy Session Note  Patient Details  Name: [name] [name] MRN: [id number] Date of Birth: 09-03-1970  Today's Date: 07/15/2013 Time: 1450-1530 Time Calculation (min): 40 min  Short Term Goals: Week 1:  PT Short Term Goal 1 (Week 1): Pt will perform supine<>sit with max A and 75% cueing for initiation and sequencing. PT Short Term Goal 2 (Week 1): Pt will perform bed<>w/c transfer with Max A and 75% cueing for initiation and sequencing. PT Short Term Goal 3 (Week 1): Pt will perform w/c mobility x20' in controlled environment with max A and 75% cueing. PT Short Term Goal 4 (Week 1): Pt will perform sit<>stand transfer from w/c with max A and 75% cueing.  Skilled Therapeutic Interventions/Progress Updates:   Pt received seated in w/c; agreeable to therapy. Session focused on w/c mobility, furniture transfers. Donned bilat shoes to increase M/L stability of bilat ankles during transfers. Performed w/c mobility x90' in controlled environment with bilat UE's and mod A, increased time, max encouragement. Bilat HOH assist required to increase excursion of hands on bilat hand rims. W/c mobility trial ended secondary to pt fatigue. Transported pt remaining distance to rehab apartment with Total A. Pt stated feeling "woozy" and "like I'm going to throw up" when pushing w/c around turns, obstacles.  In rehab apartment, pt performed stand pivot transfer from w/c>couch with max A, from couch>w/c with min A, increased time. Max multimodal cueing for setup, technique, safe hand placement, and safety awareness (focus on pt tendency to attempt to sit down without being in close proximity to chair). Session ended in pt room, where pt was left seated in w/c with quick release belt on for safety and all needs within reach.  Addendum: Long term goal addressing w/c mobility in controlled environment upgraded secondary to pt performance during this session. Long term goal added to address pt's family  bumping w/c up/down 13 stairs to enter home.  Therapy Documentation Precautions:  Precautions Precautions: Fall Restrictions Weight Bearing Restrictions: No Vital Signs: Therapy Vitals Temp: 98.1 F (36.7 C) Temp src: Oral Pulse Rate: 69 Resp: 17 BP: 129/88 mmHg Patient Position (if appropriate): Sitting Oxygen Therapy SpO2: 98 % O2 Device: None (Room air) Pain: Pain Assessment Pain Assessment: No/denies pain Pain Score: 0-No pain Locomotion : Ambulation Ambulation/Gait Assistance: Not tested (comment)   See FIM for current functional status  Therapy/Group: Individual Therapy  Hobble, Lorenda Ishihara 07/15/2013, 3:19 PM

## 2013-07-15 NOTE — Progress Notes (Signed)
Social Work Lucy Chris, LCSW Social Worker Signed  Patient Care Conference Service date: 07/15/2013 12:56 PM  Inpatient RehabilitationTeam Conference and Plan of Care Update Date: 07/15/2013   Time: 10;45 AM     Patient Name: [name] [name]       Medical Record Number: [id number]   Date of Birth: 1970-05-28 Sex: Female         Room/Bed: 4M08C/4M08C-01 Payor Info: Payor: MEDICARE / Plan: MEDICARE PART A AND B / Product Type: *No Product type* /   Admitting Diagnosis: MS exac  yes SLP   Admit Date/Time:  07/09/2013  4:36 PM Admission Comments: No comment available   Primary Diagnosis:  <principal problem not specified> Principal Problem: <principal problem not specified>    Patient Active Problem List     Diagnosis  Date Noted   .  Multiple sclerosis exacerbation  07/03/2013   .  Sepsis  06/08/2013   .  Aspiration pneumonia  06/03/2013   .  Depression  07/21/2012   .  Constipation  06/03/2012   .  Cutaneous abscess of buttock  06/03/2012   .  TIA (transient ischemic attack)  05/31/2012   .  Multiple sclerosis, relapsing-remitting  05/29/2012   .  Right leg weakness  05/28/2012   .  Fall  05/28/2012   .  Leukocytosis  05/28/2012   .  Hypokalemia  05/28/2012   .  BENIGN POSITIONAL VERTIGO  01/27/2010   .  CARPAL TUNNEL SYNDROME, LEFT  04/14/2009   .  SHOULDER STRAIN, LEFT  03/03/2009   .  DIABETES MELLITUS, TYPE II  09/30/2006   .  DYSLIPIDEMIA  09/30/2006   .  TOBACCO USER  09/30/2006   .  HYPERTENSION  09/30/2006   .  Multiple sclerosis  12/05/2005     Expected Discharge Date: Expected Discharge Date: 07/24/13  Team Members Present: Physician leading conference: Dr. Claudette Laws Social Worker Present: Dossie Der, LCSW Nurse Present: Carlean Purl, RN PT Present: Wanda Plump, PT;Blair Hobble, PT OT Present: Bretta Bang, OT SLP Present: Other (comment) Joni Reining Page-SP) PPS Coordinator present : Tora Duck, RN, CRRN        Current Status/Progress   Goal  Weekly Team Focus   Medical     cognitive def, fearful of movement  improve fear of falling  establish fxnl baseline   Bowel/Bladder     cont of bowel and bladder  cont of bowel and bladder  remain cont of bowel and bladder   Swallow/Nutrition/ Hydration     regular/thin liquids       ADL's     total assist  min / mod  assist  improve trunk control, initiation, sustained attention, and decrease fear with transitional movements   Mobility     Max-Total A  Goals downgraded to Min-Mod A at w/c level  Participation in therapy, intellectual awareness, sustained attention, postural stability, initiate family training,  pursue bilat AFO consult and w/c evaluation   Communication     min-mod assist   supervision   carryover of compensatory strategies for dysarthria   Safety/Cognition/ Behavioral Observations    Max assist   min assist   skilled family education, compensatory strategies.    Pain     no pain  les than 2  assess for pain q4h   Skin     N/a  free of skin breakdown  assess skin q shift     *See Care Plan and progress notes for long and short-term  goals.    Barriers to Discharge:  need to establish baseline      Possible Resolutions to Barriers:    therapists to meet family      Discharge Planning/Teaching Needs:    Home with son and son's girlfriend who can provide care-was prior to admission.  Will have son come and attend therapies with pt      Team Discussion:    Focus on family education-pt has severe deficits with movement, little control over body,severe cognitive deficits.  Consult for AFO's and will need w/c eval   Revisions to Treatment Plan:    Downgraded goals to mod level-wheelchair level    Continued Need for Acute Rehabilitation Level of Care: The patient requires daily medical management by a physician with specialized training in physical medicine and rehabilitation for the following conditions: Daily direction of a multidisciplinary physical  rehabilitation program to ensure safe treatment while eliciting the highest outcome that is of practical value to the patient.: Yes Daily medical management of patient stability for increased activity during participation in an intensive rehabilitation regime.: Yes Daily analysis of laboratory values and/or radiology reports with any subsequent need for medication adjustment of medical intervention for : Neurological problems  [name], [name] [name] 07/15/2013, 12:56 PM          Patient ID: [name] [name], female   DOB: [birth year], 43 y.o.   MRN: [id number]

## 2013-07-15 NOTE — Progress Notes (Addendum)
[name] [name] is a 43 y.o. right-handed female with history of hypertension, diabetes mellitus with peripheral neuropathy as well as multiple sclerosis diagnosed 2007. Patient lives with her son and used a Programmer, multimedia prior to admission. Presented 07/03/2013 with increasing weakness over the last 3-4 days as well as mild nausea with vomiting. There was report of a witnessed fall by her son striking her face without loss of consciousness. Cranial CT scan with no acute intracranial abnormalities. Severe changes of small vessel disease of the white matter consistent with multiple sclerosis. Placed on intravenous Solu-Medrol x5 days for suspect MS exacerbation completed 07/08/2013. Psychiatry was consulted in relation to depression placed on Wellbutrin  Subjective/Complaints: Cont of bowel and bladder  Review of Systems - limited by cognition  Objective: Vital Signs: Blood pressure 117/81, pulse 61, temperature 98 F (36.7 C), temperature source Oral, resp. rate 19, height _0  (1.549 m), weight 57.8 kg (127 lb 6.8 oz), last menstrual period 08/23/2012, SpO2 100.00%. No results found. Results for orders placed during the hospital encounter of 07/09/13 (from the past 72 hour(s))  GLUCOSE, CAPILLARY     Status: Abnormal   Collection Time    07/12/13  8:07 AM      Result Value Ref Range   Glucose-Capillary 100 (*) 70 - 99 mg/dL   Comment 1 Notify RN     Comment 2 Documented in Chart    GLUCOSE, CAPILLARY     Status: Abnormal   Collection Time    07/12/13 11:21 AM      Result Value Ref Range   Glucose-Capillary 118 (*) 70 - 99 mg/dL   Comment 1 Notify RN     Comment 2 Documented in Chart    GLUCOSE, CAPILLARY     Status: None   Collection Time    07/12/13  5:19 PM      Result Value Ref Range   Glucose-Capillary 98  70 - 99 mg/dL   Comment 1 Documented in Chart    GLUCOSE, CAPILLARY     Status: Abnormal   Collection Time    07/12/13  8:55 PM      Result Value Ref Range    Glucose-Capillary 144 (*) 70 - 99 mg/dL   Comment 1 Notify RN    GLUCOSE, CAPILLARY     Status: None   Collection Time    07/13/13  7:29 AM      Result Value Ref Range   Glucose-Capillary 99  70 - 99 mg/dL   Comment 1 Notify RN    GLUCOSE, CAPILLARY     Status: Abnormal   Collection Time    07/13/13 11:16 AM      Result Value Ref Range   Glucose-Capillary 118 (*) 70 - 99 mg/dL   Comment 1 Notify RN    GLUCOSE, CAPILLARY     Status: Abnormal   Collection Time    07/13/13  4:41 PM      Result Value Ref Range   Glucose-Capillary 115 (*) 70 - 99 mg/dL  GLUCOSE, CAPILLARY     Status: Abnormal   Collection Time    07/13/13  9:45 PM      Result Value Ref Range   Glucose-Capillary 114 (*) 70 - 99 mg/dL  GLUCOSE, CAPILLARY     Status: None   Collection Time    07/14/13  7:17 AM      Result Value Ref Range   Glucose-Capillary 91  70 - 99 mg/dL   Comment 1 Notify RN  GLUCOSE, CAPILLARY     Status: Abnormal   Collection Time    07/14/13 11:14 AM      Result Value Ref Range   Glucose-Capillary 106 (*) 70 - 99 mg/dL   Comment 1 Notify RN    URINALYSIS, ROUTINE W REFLEX MICROSCOPIC     Status: Abnormal   Collection Time    07/14/13  1:44 PM      Result Value Ref Range   Color, Urine YELLOW  YELLOW   APPearance CLEAR  CLEAR   Specific Gravity, Urine 1.016  1.005 - 1.030   pH 7.0  5.0 - 8.0   Glucose, UA NEGATIVE  NEGATIVE mg/dL   Hgb urine dipstick NEGATIVE  NEGATIVE   Bilirubin Urine NEGATIVE  NEGATIVE   Ketones, ur NEGATIVE  NEGATIVE mg/dL   Protein, ur NEGATIVE  NEGATIVE mg/dL   Urobilinogen, UA 1.0  0.0 - 1.0 mg/dL   Nitrite NEGATIVE  NEGATIVE   Leukocytes, UA SMALL (*) NEGATIVE  URINE MICROSCOPIC-ADD ON     Status: Abnormal   Collection Time    07/14/13  1:44 PM      Result Value Ref Range   Squamous Epithelial / LPF FEW (*) RARE   WBC, UA 3-6  <3 WBC/hpf   RBC / HPF 0-2  <3 RBC/hpf   Bacteria, UA FEW (*) RARE  GLUCOSE, CAPILLARY     Status: Abnormal   Collection  Time    07/14/13  4:32 PM      Result Value Ref Range   Glucose-Capillary 147 (*) 70 - 99 mg/dL  GLUCOSE, CAPILLARY     Status: Abnormal   Collection Time    07/14/13  9:03 PM      Result Value Ref Range   Glucose-Capillary 133 (*) 70 - 99 mg/dL     HEENT: normal Cardio: RRR and no murmurs Resp: CTA B/L GI: BS positive and NT,ND Extremity:  No Edema Skin:   Intact Neuro:   more alert today. Delayed processing, Abnormal Sensory decreaswed sensation in both hands and feet to LT, Abnormal Motor 4- Left delt bi, tri, grip, 4+ on Right sided, 3-/5 LLE, 4/5 RLE, Abnormal FMC Ataxic/ dec FMC and Tone  Hypertonia and Dysarthric Musc/Skel:  Other no pain with AAROM of limbs Gen NAD Psych: flat   Assessment/Plan: 1. Functional deficits secondary to MS exacerbation which require 3+ hours per day of interdisciplinary therapy in a comprehensive inpatient rehab setting. Physiatrist is providing close team supervision and 24 hour management of active medical problems listed below. Physiatrist and rehab team continue to assess barriers to discharge/monitor patient progress toward functional and medical goals. Team conference today please see physician documentation under team conference tab, met with team face-to-face to discuss problems,progress, and goals. Formulized individual treatment plan based on medical history, underlying problem and comorbidities. FIM: FIM - Bathing Bathing Steps Patient Completed: Chest;Right Arm;Left Arm;Abdomen;Right upper leg;Left upper leg Bathing: 3: Mod-Patient completes 5-7 63f10 parts or 50-74%  FIM - Upper Body Dressing/Undressing Upper body dressing/undressing steps patient completed: Thread/unthread right sleeve of pullover shirt/dresss;Thread/unthread left sleeve of pullover shirt/dress Upper body dressing/undressing: 1: Total-Patient completed less than 25% of tasks FIM - Lower Body Dressing/Undressing Lower body dressing/undressing: 1: Total-Patient  completed less than 25% of tasks  FIM - Toileting Toileting steps completed by patient: Performs perineal hygiene Toileting: 2: Max-Patient completed 1 of 3 steps  FIM - TRadio producerDevices: BRecruitment consultantTransfers: 2-To toilet/BSC: Max A (lift and  lower assist);2-From toilet/BSC: Max A (lift and lower assist)  FIM - Engineer, site Assistive Devices: Arm rests;Bed rails;HOB elevated Bed/Chair Transfer: 3: Supine > Sit: Mod A (lifting assist/Pt. 50-74%/lift 2 legs;3: Bed > Chair or W/C: Mod A (lift or lower assist);2: Chair or W/C > Bed: Max A (lift and lower assist)  FIM - Locomotion: Wheelchair Distance: 20 Locomotion: Wheelchair: 0: Activity did not occur FIM - Locomotion: Ambulation Locomotion: Ambulation Assistive Devices: Other (comment);Walker - Rolling (ACE bandage at bilat ankles for dorsiflexion assist) Ambulation/Gait Assistance: 1: +2 Total assist (+2A for w/c follow) Locomotion: Ambulation: 1: Two helpers  Comprehension Comprehension Mode: Auditory Comprehension: 3-Understands basic 50 - 74% of the time/requires cueing 25 - 50%  of the time  Expression Expression Mode: Verbal Expression: 3-Expresses basic 50 - 74% of the time/requires cueing 25 - 50% of the time. Needs to repeat parts of sentences.  Social Interaction Social Interaction: 3-Interacts appropriately 50 - 74% of the time - May be physically or verbally inappropriate.  Problem Solving Problem Solving: 2-Solves basic 25 - 49% of the time - needs direction more than half the time to initiate, plan or complete simple activities  Memory Memory: 2-Recognizes or recalls 25 - 49% of the time/requires cueing 51 - 75% of the time  Medical Problem List and Plan:  1. Functional deficits secondary to MS exacerbation.Solumedrol 5 day course completed  2. DVT Prophylaxis/Anticoagulation: Subcutaneous Lovenox.Monitor platelet count any signs of bleeding   3. Pain Management: tylenol as needed  4. Mood: Wellbutrin 150 mg daily. Followup per psychiatry services.provide emotional support  5. Neuropsych: This patient is not capable of making decisions on her own behalf.  6.Hypertension.Norvasc 5 mg daily,Lopressor 25 mg twice daily. bp's controlled 7.Diabetes Mellitus with peripheral neuropathy. Latest hemoglobin A1c 5.2.   -Patient on Glucophage 500 mg daily prior to admission.  -no meds at present  -reasonable control  LOS (Days) 6 A FACE TO FACE EVALUATION WAS PERFORMED  KIRSTEINS,ANDREW E 07/15/2013, 6:50 AM

## 2013-07-15 NOTE — Patient Care Conference (Signed)
Inpatient RehabilitationTeam Conference and Plan of Care Update Date: 07/15/2013   Time: 10;45 AM    Patient Name: [name] [name]      Medical Record Number: [id number]  Date of Birth: [birth year] Sex: Female         Room/Bed: 4M08C/4M08C-01 Payor Info: Payor: MEDICARE / Plan: MEDICARE PART A AND B / Product Type: *No Product type* /    Admitting Diagnosis: MS exac  yes SLP  Admit Date/Time:  07/09/2013  4:36 PM Admission Comments: No comment available   Primary Diagnosis:  <principal problem not specified> Principal Problem: <principal problem not specified>  Patient Active Problem List   Diagnosis Date Noted  . Multiple sclerosis exacerbation 07/03/2013  . Sepsis 06/08/2013  . Aspiration pneumonia 06/03/2013  . Depression 07/21/2012  . Constipation 06/03/2012  . Cutaneous abscess of buttock 06/03/2012  . TIA (transient ischemic attack) 05/31/2012  . Multiple sclerosis, relapsing-remitting 05/29/2012  . Right leg weakness 05/28/2012  . Fall 05/28/2012  . Leukocytosis 05/28/2012  . Hypokalemia 05/28/2012  . BENIGN POSITIONAL VERTIGO 01/27/2010  . CARPAL TUNNEL SYNDROME, LEFT 04/14/2009  . SHOULDER STRAIN, LEFT 03/03/2009  . DIABETES MELLITUS, TYPE II 09/30/2006  . DYSLIPIDEMIA 09/30/2006  . TOBACCO USER 09/30/2006  . HYPERTENSION 09/30/2006  . Multiple sclerosis 12/05/2005    Expected Discharge Date: Expected Discharge Date: 07/24/13  Team Members Present: Physician leading conference: Dr. Claudette LawsAndrew Kirsteins Social Worker Present: Dossie DerBecky [name], LCSW Nurse Present: Carlean PurlMaryann Barbour, RN PT Present: Wanda Plumparoline Cook, PT;Blair Hobble, PT OT Present: Bretta BangKris Gellert, OT SLP Present: Other (comment) Joni Reining(Nicole Page-SP) PPS Coordinator present : Tora DuckMarie Noel, RN, CRRN     Current Status/Progress Goal Weekly Team Focus  Medical   cognitive def, fearful of movement  improve fear of falling  establish fxnl baseline   Bowel/Bladder   cont of bowel and bladder  cont of bowel and  bladder  remain cont of bowel and bladder   Swallow/Nutrition/ Hydration   regular/thin liquids         ADL's   total assist  min / mod  assist  improve trunk control, initiation, sustained attention, and decrease fear with transitional movements   Mobility   Max-Total A  Goals downgraded to Min-Mod A at w/c level  Participation in therapy, intellectual awareness, sustained attention, postural stability, initiate family training,  pursue bilat AFO consult and w/c evaluation   Communication   min-mod assist   supervision   carryover of compensatory strategies for dysarthria   Safety/Cognition/ Behavioral Observations  Max assist   min assist   skilled family education, compensatory strategies.    Pain   no pain  les than 2  assess for pain q4h   Skin   N/a  free of skin breakdown  assess skin q shift      *See Care Plan and progress notes for long and short-term goals.  Barriers to Discharge: need to establish baseline    Possible Resolutions to Barriers:  therapists to meet family    Discharge Planning/Teaching Needs:  Home with son and son's girlfriend who can provide care-was prior to admission.  Will have son come and attend therapies with pt      Team Discussion:  Focus on family education-pt has severe deficits with movement, little control over body,severe cognitive deficits.  Consult for AFO's and will need w/c eval  Revisions to Treatment Plan:  Downgraded goals to mod level-wheelchair level   Continued Need for Acute Rehabilitation Level of Care: The patient requires daily  medical management by a physician with specialized training in physical medicine and rehabilitation for the following conditions: Daily direction of a multidisciplinary physical rehabilitation program to ensure safe treatment while eliciting the highest outcome that is of practical value to the patient.: Yes Daily medical management of patient stability for increased activity during participation  in an intensive rehabilitation regime.: Yes Daily analysis of laboratory values and/or radiology reports with any subsequent need for medication adjustment of medical intervention for : Neurological problems  [name], Lemar Livings 07/15/2013, 12:56 PM

## 2013-07-15 NOTE — Plan of Care (Signed)
Problem: RH Stairs Goal: LTG Patient will ambulate up and down stairs w/assist (PT) LTG: Patient will ambulate up and down # of stairs with assistance (PT)  Outcome: Not Applicable Date Met:  05/24/54 D/C'ed due to pt discharging at w/c level.

## 2013-07-15 NOTE — Progress Notes (Signed)
Orthopedic Tech Progress Note Patient Details:  Georgeanna LeaDeirdre K Wagar 1970-07-28 [id number]  Patient ID: Georgeanna Leaeirdre K Mcelwee, female   DOB: 1970-07-28, 43 y.o.   MRN: [id number] Called in [employer] brace order; spoke with Sharyne RichtersJeff  Crawford, Rembert 07/15/2013, 1:09 PM

## 2013-07-15 NOTE — Progress Notes (Signed)
Physical Therapy Session Note  Patient Details  Name: [name] [name] MRN: [id number] Date of Birth: January 23, 1970  Today's Date: 07/15/2013 Time: 1415-1430 Time Calculation (min): 15 min  Short Term Goals: Week 1:  PT Short Term Goal 1 (Week 1): Pt will perform supine<>sit with max A and 75% cueing for initiation and sequencing. PT Short Term Goal 2 (Week 1): Pt will perform bed<>w/c transfer with Max A and 75% cueing for initiation and sequencing. PT Short Term Goal 3 (Week 1): Pt will perform w/c mobility x20' in controlled environment with max A and 75% cueing. PT Short Term Goal 4 (Week 1): Pt will perform sit<>stand transfer from w/c with max A and 75% cueing.  Skilled Therapeutic Interventions/Progress Updates:    Patient received sitting in wheelchair. First 15 minutes of session spent coaxing patient to participate. With coaxing/motivation from primary therapist, patient agreeable to participate in transfer training. Patient performed stand pivot transfer wheelchair<>bed with modA, max cues for sequencing/technique and attention to task. Patient left sitting in wheelchair with seatbelt donned and all needs within reach.  Therapy Documentation Precautions:  Precautions Precautions: Fall Restrictions Weight Bearing Restrictions: No General: Amount of Missed PT Time (min): 15 Minutes Missed Time Reason: Patient unwilling/refused to participate without medical reason (15 minutes of session spent coaxing patient to participate) Pain: Pain Assessment Pain Assessment: No/denies pain Pain Score: 0-No pain Locomotion : Ambulation Ambulation/Gait Assistance: Not tested (comment)   See FIM for current functional status  Therapy/Group: Individual Therapy  Chipper HerbBridget S Ripa Bridget S. Ripa, PT, DPT 07/15/2013, 2:32 PM

## 2013-07-16 ENCOUNTER — Encounter (HOSPITAL_COMMUNITY): Payer: Self-pay | Admitting: Occupational Therapy

## 2013-07-16 ENCOUNTER — Inpatient Hospital Stay (HOSPITAL_COMMUNITY): Payer: Self-pay | Admitting: Occupational Therapy

## 2013-07-16 ENCOUNTER — Inpatient Hospital Stay (HOSPITAL_COMMUNITY): Payer: Self-pay

## 2013-07-16 ENCOUNTER — Inpatient Hospital Stay (HOSPITAL_COMMUNITY): Payer: Self-pay | Admitting: Speech Pathology

## 2013-07-16 DIAGNOSIS — G35 Multiple sclerosis: Secondary | ICD-10-CM

## 2013-07-16 DIAGNOSIS — G8250 Quadriplegia, unspecified: Secondary | ICD-10-CM

## 2013-07-16 LAB — CREATININE, SERUM
CREATININE: 0.72 mg/dL (ref 0.50–1.10)
GFR calc Af Amer: >90 mL/min (ref >90)
GFR calc non Af Amer: >90 mL/min (ref >90)

## 2013-07-16 LAB — GLUCOSE, CAPILLARY
Glucose-Capillary: 106 mg/dL — ABNORMAL HIGH (ref 70–99)
Glucose-Capillary: 117 mg/dL — ABNORMAL HIGH (ref 70–99)
Glucose-Capillary: 113 mg/dL — ABNORMAL HIGH (ref 70–99)
Glucose-Capillary: 112 mg/dL — ABNORMAL HIGH (ref 70–99)

## 2013-07-16 NOTE — Progress Notes (Signed)
[name] [name] is a 43 y.o. right-handed female with history of hypertension, diabetes mellitus with peripheral neuropathy as well as multiple sclerosis diagnosed 2007. Patient lives with her son and used a Programmer, multimedia prior to admission. Presented 07/03/2013 with increasing weakness over the last 3-4 days as well as mild nausea with vomiting. There was report of a witnessed fall by her son striking her face without loss of consciousness. Cranial CT scan with no acute intracranial abnormalities. Severe changes of small vessel disease of the white matter consistent with multiple sclerosis. Placed on intravenous Solu-Medrol x5 days for suspect MS exacerbation completed 07/08/2013. Psychiatry was consulted in relation to depression placed on Wellbutrin  Subjective/Complaints: Pt without c/os today, no new issues. Recognizes me as MD Uses calendar for orientation No Pains  Full ROM limited by cognitive def  Review of Systems - limited by cognition  Objective: Vital Signs: Blood pressure 139/91, pulse 69, temperature 98.1 F (36.7 C), temperature source Oral, resp. rate 18, height '5\' 1"'  (1.549 m), weight 54.341 kg (119 lb 12.8 oz), last menstrual period 08/23/2012, SpO2 100.00%. No results found. Results for orders placed during the hospital encounter of 07/09/13 (from the past 72 hour(s))  GLUCOSE, CAPILLARY     Status: None   Collection Time    07/13/13  7:29 AM      Result Value Ref Range   Glucose-Capillary 99  70 - 99 mg/dL   Comment 1 Notify RN    GLUCOSE, CAPILLARY     Status: Abnormal   Collection Time    07/13/13 11:16 AM      Result Value Ref Range   Glucose-Capillary 118 (*) 70 - 99 mg/dL   Comment 1 Notify RN    GLUCOSE, CAPILLARY     Status: Abnormal   Collection Time    07/13/13  4:41 PM      Result Value Ref Range   Glucose-Capillary 115 (*) 70 - 99 mg/dL  GLUCOSE, CAPILLARY     Status: Abnormal   Collection Time    07/13/13  9:45 PM      Result Value Ref Range   Glucose-Capillary 114 (*) 70 - 99 mg/dL  GLUCOSE, CAPILLARY     Status: None   Collection Time    07/14/13  7:17 AM      Result Value Ref Range   Glucose-Capillary 91  70 - 99 mg/dL   Comment 1 Notify RN    GLUCOSE, CAPILLARY     Status: Abnormal   Collection Time    07/14/13 11:14 AM      Result Value Ref Range   Glucose-Capillary 106 (*) 70 - 99 mg/dL   Comment 1 Notify RN    URINALYSIS, ROUTINE W REFLEX MICROSCOPIC     Status: Abnormal   Collection Time    07/14/13  1:44 PM      Result Value Ref Range   Color, Urine YELLOW  YELLOW   APPearance CLEAR  CLEAR   Specific Gravity, Urine 1.016  1.005 - 1.030   pH 7.0  5.0 - 8.0   Glucose, UA NEGATIVE  NEGATIVE mg/dL   Hgb urine dipstick NEGATIVE  NEGATIVE   Bilirubin Urine NEGATIVE  NEGATIVE   Ketones, ur NEGATIVE  NEGATIVE mg/dL   Protein, ur NEGATIVE  NEGATIVE mg/dL   Urobilinogen, UA 1.0  0.0 - 1.0 mg/dL   Nitrite NEGATIVE  NEGATIVE   Leukocytes, UA SMALL (*) NEGATIVE  URINE CULTURE     Status: None   Collection  Time    07/14/13  1:44 PM      Result Value Ref Range   Specimen Description URINE, CLEAN CATCH     Special Requests NONE     Culture  Setup Time       Value: 07/14/2013 19:52     Performed at [facility] Count       Value: >=100,000 COLONIES/ML     Performed at [facility]   Culture       Value: Multiple bacterial morphotypes present, none predominant. Suggest appropriate recollection if clinically indicated.     Performed at [facility]   Report Status 07/15/2013 FINAL    URINE MICROSCOPIC-ADD ON     Status: Abnormal   Collection Time    07/14/13  1:44 PM      Result Value Ref Range   Squamous Epithelial / LPF FEW (*) RARE   WBC, UA 3-6  <3 WBC/hpf   RBC / HPF 0-2  <3 RBC/hpf   Bacteria, UA FEW (*) RARE  GLUCOSE, CAPILLARY     Status: Abnormal   Collection Time    07/14/13  4:32 PM      Result Value Ref Range   Glucose-Capillary 147 (*) 70 - 99 mg/dL  GLUCOSE,  CAPILLARY     Status: Abnormal   Collection Time    07/14/13  9:03 PM      Result Value Ref Range   Glucose-Capillary 133 (*) 70 - 99 mg/dL  GLUCOSE, CAPILLARY     Status: None   Collection Time    07/15/13  7:20 AM      Result Value Ref Range   Glucose-Capillary 96  70 - 99 mg/dL   Comment 1 Notify RN    GLUCOSE, CAPILLARY     Status: None   Collection Time    07/15/13 11:28 AM      Result Value Ref Range   Glucose-Capillary 90  70 - 99 mg/dL   Comment 1 Notify RN    GLUCOSE, CAPILLARY     Status: Abnormal   Collection Time    07/15/13  4:32 PM      Result Value Ref Range   Glucose-Capillary 115 (*) 70 - 99 mg/dL  GLUCOSE, CAPILLARY     Status: Abnormal   Collection Time    07/15/13  9:01 PM      Result Value Ref Range   Glucose-Capillary 124 (*) 70 - 99 mg/dL  CREATININE, SERUM     Status: None   Collection Time    07/16/13  5:52 AM      Result Value Ref Range   Creatinine, Ser 0.72  0.50 - 1.10 mg/dL   GFR calc non Af Amer >90  >90 mL/min   GFR calc Af Amer >90  >90 mL/min   Comment: (NOTE)     The eGFR has been calculated using the CKD EPI equation.     This calculation has not been validated in all clinical situations.     eGFR's persistently <90 mL/min signify possible Chronic Kidney     Disease.     HEENT: normal Cardio: RRR and no murmurs Resp: CTA B/L GI: BS positive and NT,ND Extremity:  No Edema Skin:   Intact Neuro:   more alert today. Delayed processing, Abnormal Sensory decreaswed sensation in both hands and feet to LT, Abnormal Motor 4- Left delt bi, tri, grip, 4+ on Right sided, 3-/5 LLE, 4/5 RLE, Abnormal FMC Ataxic/ dec  [place] and Tone  Hypertonia and Dysarthric Musc/Skel:  Other no pain with AAROM of limbs Gen NAD Psych: flat   Assessment/Plan: 1. Functional deficits secondary to MS exacerbation which require 3+ hours per day of interdisciplinary therapy in a comprehensive inpatient rehab setting. Physiatrist is providing close team supervision  and 24 hour management of active medical problems listed below. Physiatrist and rehab team continue to assess barriers to discharge/monitor patient progress toward functional and medical goals.  FIM: FIM - Bathing Bathing Steps Patient Completed: Chest;Right Arm;Left Arm;Abdomen;Right upper leg;Left upper leg;Front perineal area;Buttocks;Right lower leg (including foot) (bed level - supine) Bathing: 4: Min-Patient completes 8-9 [id number] parts or 75+ percent  FIM - Upper Body Dressing/Undressing Upper body dressing/undressing steps patient completed: Thread/unthread right sleeve of front closure shirt/dress;Button/unbutton shirt Upper body dressing/undressing: 3: Mod-Patient completed 50-74% of tasks FIM - Lower Body Dressing/Undressing Lower body dressing/undressing: 1: Total-Patient completed less than 25% of tasks  FIM - Toileting Toileting steps completed by patient: Performs perineal hygiene Toileting: 2: Max-Patient completed 1 of 3 steps  FIM - TRadio producerDevices: BRecruitment consultantTransfers: 2-To toilet/BSC: Max A (lift and lower assist);2-From toilet/BSC: Max A (lift and lower assist)  FIM - BEngineer, siteAssistive Devices: Arm rests;Walker (w/c<>couch) Bed/Chair Transfer: 3: Sit > Supine: Mod A (lifting assist/Pt. 50-74%/lift 2 legs);3: Supine > Sit: Mod A (lifting assist/Pt. 50-74%/lift 2 legs;3: Bed > Chair or W/C: Mod A (lift or lower assist);3: Chair or W/C > Bed: Mod A (lift or lower assist)  FIM - Locomotion: Wheelchair Distance: 90 Locomotion: Wheelchair: 2: Travels 50 - 149 ft with moderate assistance (Pt: 50 - 74%) FIM - Locomotion: Ambulation Locomotion: Ambulation Assistive Devices: Other (comment);Walker - Rolling (ACE bandage at bilat ankles for dorsiflexion assist) Ambulation/Gait Assistance: Not tested (comment) Locomotion: Ambulation: 0: Activity did not occur  Comprehension Comprehension Mode:  Auditory Comprehension: 3-Understands basic 50 - 74% of the time/requires cueing 25 - 50%  of the time  Expression Expression Mode: Verbal Expression: 3-Expresses basic 50 - 74% of the time/requires cueing 25 - 50% of the time. Needs to repeat parts of sentences.  Social Interaction Social Interaction: 3-Interacts appropriately 50 - 74% of the time - May be physically or verbally inappropriate.  Problem Solving Problem Solving: 2-Solves basic 25 - 49% of the time - needs direction more than half the time to initiate, plan or complete simple activities  Memory Memory: 1-Recognizes or recalls less than 25% of the time/requires cueing greater than 75% of the time  Medical Problem List and Plan:  1. Functional deficits secondary to MS exacerbation.Solumedrol 5 day course completed  2. DVT Prophylaxis/Anticoagulation: Subcutaneous Lovenox.Monitor platelet count any signs of bleeding  3. Pain Management: tylenol as needed  4. Mood: Wellbutrin 150 mg daily. Followup per psychiatry services.provide emotional support  5. Neuropsych: This patient is not capable of making decisions on her own behalf.  6.Hypertension.Norvasc 5 mg daily,Lopressor 25 mg twice daily. bp's controlled 7.Diabetes Mellitus with peripheral neuropathy. Latest hemoglobin A1c 5.2.   -Patient on Glucophage 500 mg daily prior to admission.  -no meds at present  -reasonable control  LOS (Days) 7 A FACE TO FACE EVALUATION WAS PERFORMED  KIRSTEINS,ANDREW E 07/16/2013, 7:04 AM

## 2013-07-16 NOTE — Evaluation (Signed)
Physical Therapy Assessment and Plan  Patient Details  Name: [name] [name] MRN: [id number] Date of Birth: 09-21-1970  PT Diagnosis: Vertigo of central origin  Today's Date: 07/16/2013 Time: 1010-1105 Time Calculation (min): 55 min  Problem List:  Patient Active Problem List   Diagnosis Date Noted  . Multiple sclerosis exacerbation 07/03/2013  . Sepsis 06/08/2013  . Aspiration pneumonia 06/03/2013  . Depression 07/21/2012  . Constipation 06/03/2012  . Cutaneous abscess of buttock 06/03/2012  . TIA (transient ischemic attack) 05/31/2012  . Multiple sclerosis, relapsing-remitting 05/29/2012  . Right leg weakness 05/28/2012  . Fall 05/28/2012  . Leukocytosis 05/28/2012  . Hypokalemia 05/28/2012  . BENIGN POSITIONAL VERTIGO 01/27/2010  . CARPAL TUNNEL SYNDROME, LEFT 04/14/2009  . SHOULDER STRAIN, LEFT 03/03/2009  . DIABETES MELLITUS, TYPE II 09/30/2006  . DYSLIPIDEMIA 09/30/2006  . TOBACCO USER 09/30/2006  . HYPERTENSION 09/30/2006  . Multiple sclerosis 12/05/2005    Past Medical History:  Past Medical History  Diagnosis Date  . Multiple sclerosis   . HYPERTENSION 09/30/2006  . DIABETES MELLITUS, TYPE II 09/30/2006  . TIA (transient ischemic attack)   . TOBACCO USER 09/30/2006    Qualifier: Diagnosis of  By: Delrae AlfredMulberry MD, [name]    . Multiple sclerosis, relapsing-remitting 05/29/2012  . DYSLIPIDEMIA 09/30/2006    Qualifier: Diagnosis of  By: Delrae AlfredMulberry MD, [name]    . DIABETES MELLITUS, TYPE II 09/30/2006    Qualifier: Diagnosis of  By: Delrae AlfredMulberry MD, [name]    . BENIGN POSITIONAL VERTIGO 01/27/2010    Qualifier: Diagnosis of  By: Delrae AlfredMulberry MD, [name]    . Stroke   . Neuromuscular disorder     hx of ms  . Depression    Past Surgical History:  History reviewed. No pertinent past surgical history. Precautions/Restrictions Restrictions Weight Bearing Restrictions: No General   Vital SignsTherapy Vitals Pulse Rate: 78 BP: 112/80 mmHg Patient Position (if  appropriate): Sitting Pain Pain Assessment Pain Assessment: Faces Faces Pain Scale: Hurts little more Pain Type: Chronic pain Pain Location: Neck Pain Orientation: Right Pain Descriptors / Indicators: Sore Pain Onset: With Activity Pain Intervention(s): Massage;Other (Comment) (Light manual stretching) Multiple Pain Sites: No  Subjective Symptoms: "woozy" and "bouncing"  Eye Alignment Alignment appears WNL. However, R head tilt at rest suggestive of abnormal OTR; eye occulusion test not significant.  Spontaneous  Nystagmus Not observed  Gaze holding nystagmus Bilateral direction-changing nystagmus  Smooth pursuit Catch up saccades with horizontal tracking  Oculomotor - Convergence abnormal (unable to discern if test limited by attention).  - No diplopia.  Saccades Smoothed saccades in all directions  VOR slow (+) and symptomatic bilaterally with horizontal head turning - Vertical head turning not formally assessed  Head Thrust Test Unable to formally assess secondary to cervical muscle guarding with slow VOR.   Head Shaking Nystagmus No nystagmus noted; however, difficult to fully assess secondary to muscle guarding (due to pain in cervical spine vs motion sensitivity).  Modified Rt. Gilberto BetterHallpike Dix (-) for nystagmus; no vertiginous symptoms.  Modified Lt. Gilberto BetterHallpike Dix (-) for nystagmus; no vertiginous symptoms.  Rt. Roll Test Not tested  Lt. Roll Test  Not tested  Motion sensitivity (+) with supine<>sit, sit<>R side lying, sit<>L side lying, and bilat head turning  VOR Cancellation Not formally assessed  Other Hearing appears grossly intact bilaterally   Visual- Vestibular Interactions:  - Sitting: Increased symptoms with supine<>sit. When seated for >10 seconds, pt with tendency toward posterior pelvic tilt, pushing trunk posteriorly, and hips sliding forward in w/c seat. -  Standing: Pt perceives posterior LOB when standing upright, causing pt to perform bilat hip/knee flexion. -  Walking: Minimal ambulation performed in previous PT sessions secondary to pt fear of falling, perception of posterior LOB in standing.  Findings: Vestibular exam limited by decreased sustained attention and pt difficulty following multiple-step commands. Oculomotor signs suggestive of vertigo of central origin. Saccades impaired in all directions; VOR impaired with horizontal head turns. Positional vertigo ruled out by (-) Modified Dix-Hallpike Tests bilaterally.  Plan:  1.Habituation: by exposing pt to noxious stimuli (supine<>sit, sit<>side lying bilaterally, and horizontal head movements), symptoms caused by position/movement will decrease over time. Goal is production of mild, temporary symptoms (up to 6-7/10). 2. Compensation: cue pt to use compensatory strategies to increase pt tolerance to functional mobility. Examples: visual fixation, use of assistive device. When turning head, instruct pt to first move eyes to target, followed by head toward target. 3. In future sessions, will attempt to perform bilat Roll Test to rule out horizontal canal BPPV.     Calvert Cantor 07/16/2013, 11:16 AM

## 2013-07-16 NOTE — Progress Notes (Signed)
Occupational Therapy Session Note  Patient Details  Name: [name] [name] MRN: [id number] Date of Birth: 11/16/1970  Today's Date: 07/16/2013 Time: 1115-1200 Time Calculation (min): 45 min  Short Term Goals: Week 1:  OT Short Term Goal 1 (Week 1): Pt. will feed self with mod I OT Short Term Goal 1 - Progress (Week 1): Not met OT Short Term Goal 2 (Week 1): Pt will bathe with mod assist OT Short Term Goal 2 - Progress (Week 1): Not met OT Short Term Goal 3 (Week 1): Pt. will dress UB with minimal assist OT Short Term Goal 3 - Progress (Week 1): Not met OT Short Term Goal 4 (Week 1): Pt. will dress LB with mod assist OT Short Term Goal 4 - Progress (Week 1): Not met OT Short Term Goal 5 (Week 1): Pt will transfer to BSC with mod assist OT Short Term Goal 5 - Progress (Week 1): Not met  Skilled Therapeutic Interventions/Progress Updates:    Patient seen this am for OT intervention to address increased participation in self care activities.  Patient with improved sitting balance - static - yet unable to free upper extremities from support or weight shift to left to sit upright.  Patient with improved performance when allowed to dress lower extremities from supine position.  Patient able to roll left and right in bed with use of bed rails, increased time, and increased encouragement / physical guidance.  Once directed to / guided through a movement, patient able to repeat.    Therapy Documentation Precautions:  Precautions Precautions: Fall Restrictions Weight Bearing Restrictions: No  Pain: Pain Assessment Pain Assessment: No/denies pain Pain Score: 6  Pain Type: Acute pain Pain Location: Neck Pain Descriptors / Indicators: Aching Pain Onset: Gradual Pain Intervention(s): Medication (See eMAR)  See FIM for current functional status  Therapy/Group: Individual Therapy  ,  M 07/16/2013, 4:35 PM  

## 2013-07-16 NOTE — Progress Notes (Signed)
Speech Language Pathology Daily Session Note  Patient Details  Name: [name] [name] MRN: [id number] Date of Birth: Aug 07, 1970  Today's Date: 07/16/2013 Time: 7628-3151 Time Calculation (min): 40 min  Short Term Goals: Week 1: SLP Short Term Goal 1 (Week 1): Pt will improve short term memory for recall of daily events and information for 75% accuracy with mod assist  SLP Short Term Goal 2 (Week 1): Pt will improve executive function for sequencing, organization, and error awareness during basic tasks for 75% accuracy with mod assist.  SLP Short Term Goal 3 (Week 1): Pt will tolerate her currently prescribed diet with no overt s/s of aspiration with modified independence  SLP Short Term Goal 4 (Week 1): Pt will selectively attend to functional tasks in a minimally distracting environment for 7-10 minutes with min cuing for redirection.   SLP Short Term Goal 5 (Week 1): Pt will improve her speech intelligibility to 80% accurate at the conversational level with min cuing for use of compensatory strategies.    Skilled Therapeutic Interventions:  Pt was seen for skilled speech therapy targeting memory, selective attention, and basic problem solving during structured tasks.  All structured activities were completed in a minimally distracting environment for facilitate improved selective attention to tasks.  Pt attended to a basic word search for periods of approximately 5-7 minutes before requiring min assist verbal cues for redirection.  Pt was able to locate targeted items on the word search when named with mod-max faded to min assist verbal cues for sequencing and error awareness and exhibited some emerging insight into cognitive deficits asking "Why is this so hard," when task was initially more difficult.  Improvements were most notable with increased task familiarity and repetition of trials.   Furthermore, SLP initiated trials of spaced retrieval training to target carryover for task structure  with 100% recall of targeted words following periods of brief (30 seconds to 1 miniute) delay with min assist question cues.  Continue per current plan of care.    FIM:  Comprehension Comprehension Mode: Auditory Comprehension: 3-Understands basic 50 - 74% of the time/requires cueing 25 - 50%  of the time Expression Expression Mode: Verbal Expression: 3-Expresses basic 50 - 74% of the time/requires cueing 25 - 50% of the time. Needs to repeat parts of sentences. Social Interaction Social Interaction: 3-Interacts appropriately 50 - 74% of the time - May be physically or verbally inappropriate. Problem Solving Problem Solving: 2-Solves basic 25 - 49% of the time - needs direction more than half the time to initiate, plan or complete simple activities Memory Memory: 2-Recognizes or recalls 25 - 49% of the time/requires cueing 51 - 75% of the time  Pain Pain Assessment Pain Assessment: No/denies pain Pain Score: 6  Pain Type: Acute pain Pain Location: Neck Pain Descriptors / Indicators: Aching Pain Onset: Gradual Pain Intervention(s): Medication (See eMAR)  Therapy/Group: Individual Therapy  Jackalyn Lombard, M.A. CCC-SLP  Page, Melanee Spry 07/16/2013, 3:27 PM

## 2013-07-16 NOTE — Progress Notes (Signed)
Occupational Therapy Weekly Progress Note  Patient Details  Name: TIERRA THOMA MRN: [id number] Date of Birth: [birth year]  Beginning of progress report period: July 10, 2013 End of progress report period: July 16, 2013  Today's Date: 07/16/2013 Time: 1445-1530 Time Calculation (min): 45 min  Patient has met 0 of 5 short term goals.  See list of deficits below.    Patient continues to demonstrate the following deficits: spastic quadriparesis, limited awareness, decreased attention (sustained), decreased postural control, motion sensitivity, decrease oculomotor control, and limited mental flexibility and therefore will continue to benefit from skilled OT intervention to enhance overall performance with BADL.  Patient progressing toward long term goals..  Continue plan of care.  OT Short Term Goals Week 1:  OT Short Term Goal 1 (Week 1): Pt. will feed self with mod I OT Short Term Goal 1 - Progress (Week 1): Not met OT Short Term Goal 2 (Week 1): Pt will bathe with mod assist OT Short Term Goal 2 - Progress (Week 1): Not met OT Short Term Goal 3 (Week 1): Pt. will dress UB with minimal assist OT Short Term Goal 3 - Progress (Week 1): Not met OT Short Term Goal 4 (Week 1): Pt. will dress LB with mod assist OT Short Term Goal 4 - Progress (Week 1): Not met OT Short Term Goal 5 (Week 1): Pt will transfer to [facility] with mod assist OT Short Term Goal 5 - Progress (Week 1): Not met Week 2:  OT Short Term Goal 1 (Week 2): Patient will bathe self with mod asist OT Short Term Goal 2 (Week 2): Patient will dress upper body with mod assist OT Short Term Goal 3 (Week 2): Patient will dress lower body with mod assist OT Short Term Goal 4 (Week 2): Patient will transfer to bedside commode with mod assist OT Short Term Goal 5 (Week 2): Patient will sit unsupported at edge of bed or on commode with min assist  Skilled Therapeutic Interventions/Progress Updates:    Patient seen this pm for OT  intervention to address her ability to accept weight shift to left side, to increase postural control, and ranges patient is comfortable to weight shift important for basic self care skills.  Patient very fearful of left weight shift, and pulls strongly toward right side.  Patient with increased muscle co-activation in trunk which limit active weight shift in any direction.  Level surface transfer with modified squat / stand step transfer.  Patient offers bets response when allowed to initiate and direct own movement, although needs increased time for success.    Therapy Documentation Precautions:  Precautions Precautions: Fall Restrictions Weight Bearing Restrictions: No   Pain: Pain Assessment Pain Assessment: No/denies pain Pain Score: 6  Pain Type: Acute pain Pain Location: Neck Pain Descriptors / Indicators: Aching Pain Onset: Gradual Pain Intervention(s): Medication (See eMAR)  See FIM for current functional status  Therapy/Group: Individual Therapy  Mariah Milling 07/16/2013, 4:04 PM

## 2013-07-17 ENCOUNTER — Inpatient Hospital Stay (HOSPITAL_COMMUNITY): Payer: Self-pay | Admitting: Speech Pathology

## 2013-07-17 ENCOUNTER — Inpatient Hospital Stay (HOSPITAL_COMMUNITY): Payer: Medicare Other | Admitting: Occupational Therapy

## 2013-07-17 ENCOUNTER — Inpatient Hospital Stay (HOSPITAL_COMMUNITY): Payer: Self-pay | Admitting: Physical Therapy

## 2013-07-17 DIAGNOSIS — G35 Multiple sclerosis: Secondary | ICD-10-CM

## 2013-07-17 DIAGNOSIS — G8250 Quadriplegia, unspecified: Secondary | ICD-10-CM

## 2013-07-17 LAB — GLUCOSE, CAPILLARY
GLUCOSE-CAPILLARY: 99 mg/dL (ref 70–99)
GLUCOSE-CAPILLARY: 98 mg/dL (ref 70–99)
Glucose-Capillary: 92 mg/dL (ref 70–99)
Glucose-Capillary: 164 mg/dL — ABNORMAL HIGH (ref 70–99)

## 2013-07-17 NOTE — Progress Notes (Signed)
Physical Therapy Session Note  Patient Details  Name: [name] [name] MRN: [id number] Date of Birth: [birth year]  Today's Date: 07/17/2013 Time: 1300-1400 Time Calculation (min): 60 min  Short Term Goals: Week 1:  PT Short Term Goal 1 (Week 1): Pt will perform supine<>sit with max A and 75% cueing for initiation and sequencing. PT Short Term Goal 2 (Week 1): Pt will perform bed<>w/c transfer with Max A and 75% cueing for initiation and sequencing. PT Short Term Goal 3 (Week 1): Pt will perform w/c mobility x20' in controlled environment with max A and 75% cueing. PT Short Term Goal 4 (Week 1): Pt will perform sit<>stand transfer from w/c with max A and 75% cueing.  Skilled Therapeutic Interventions/Progress Updates:   Pt received sitting in w/c with quick release belt on, son present in room but departing before session. W/c mobility x 100 ft and x 50 ft using BUEs with HOH assist for LUE and max vc's for technique, mod A for steering/propulsion. Pt with improved efficiency/larger excursion with pushes during second trial. Pt performed stand pivot transfers w/c <> mat table using RW with min A, max vc's for sequencing and technique. Sitting EOM, pt reporting dizziness 1-2/10. Pt performed transitional movements for habituation to vestibular symptoms including head turns, sit <> propped on R/L forearm, sit <> sidelying, sit <> supine, and sit <> stand. Pt requires min-mod A for functional mobility on mat and is resistant to manual facilitation for upright posture, anterior weightshift, etc. Pt reporting slight increase in dizziness unchanged with rest or during mobility 4/10. Pt returned to room and left sitting in w/c with quick release belt on and OT present for next session.   Therapy Documentation Precautions:  Precautions Precautions: Fall Restrictions Weight Bearing Restrictions: No Pain: Pain Assessment Pain Assessment: No/denies pain Pain Score: 0-No pain Locomotion : Wheelchair  Mobility Distance: 100   See FIM for current functional status  Therapy/Group: Individual Therapy  Kerney Elbe 07/17/2013, 2:22 PM

## 2013-07-17 NOTE — Progress Notes (Signed)
Social Work Patient ID: [name] [name], female   DOB: 12/21/1970, 43 y.o.   MRN: [id number] Met with pt and spoke with son via telephone to inform of team conference goals-min wheelchair level and  the need for 24 hr Physical assist at discharge on 7/3.  Son reports he will doe what care mom needs.  He was hoping she would be ambulating by the time She leaves here but has been to therapy and can see what progress she is making.  He will come in next week to attend therapies with her and learn her Care.  Discussed applying for PCS services so she could get 3-4 hours of CNA per day.  Will proceed with PCS application and work on discharge needs.

## 2013-07-17 NOTE — Progress Notes (Signed)
[name] [name] is a 43 y.o. right-handed female with history of hypertension, diabetes mellitus with peripheral neuropathy as well as multiple sclerosis diagnosed 2007. Patient lives with her son and used a Programmer, multimedia prior to admission. Presented 07/03/2013 with increasing weakness over the last 3-4 days as well as mild nausea with vomiting. There was report of a witnessed fall by her son striking her face without loss of consciousness. Cranial CT scan with no acute intracranial abnormalities. Severe changes of small vessel disease of the white matter consistent with multiple sclerosis. Placed on intravenous Solu-Medrol x5 days for suspect MS exacerbation completed 07/08/2013. Psychiatry was consulted in relation to depression placed on Wellbutrin  Subjective/Complaints: Pt without c/os today, no new issues. Delayed responses, slept well, no issues in therapy per pt No Pains  Full ROM limited by cognitive def  Review of Systems - limited by cognition  Objective: Vital Signs: Blood pressure 103/70, pulse 60, temperature 98.1 F (36.7 C), temperature source Oral, resp. rate 20, height _0  (1.549 m), weight 54.341 kg (119 lb 12.8 oz), last menstrual period 08/23/2012, SpO2 100.00%. No results found. Results for orders placed during the hospital encounter of 07/09/13 (from the past 72 hour(s))  GLUCOSE, CAPILLARY     Status: None   Collection Time    07/14/13  7:17 AM      Result Value Ref Range   Glucose-Capillary 91  70 - 99 mg/dL   Comment 1 Notify RN    GLUCOSE, CAPILLARY     Status: Abnormal   Collection Time    07/14/13 11:14 AM      Result Value Ref Range   Glucose-Capillary 106 (*) 70 - 99 mg/dL   Comment 1 Notify RN    URINALYSIS, ROUTINE W REFLEX MICROSCOPIC     Status: Abnormal   Collection Time    07/14/13  1:44 PM      Result Value Ref Range   Color, Urine YELLOW  YELLOW   APPearance CLEAR  CLEAR   Specific Gravity, Urine 1.016  1.005 - 1.030   pH 7.0  5.0 - 8.0   Glucose, UA NEGATIVE  NEGATIVE mg/dL   Hgb urine dipstick NEGATIVE  NEGATIVE   Bilirubin Urine NEGATIVE  NEGATIVE   Ketones, ur NEGATIVE  NEGATIVE mg/dL   Protein, ur NEGATIVE  NEGATIVE mg/dL   Urobilinogen, UA 1.0  0.0 - 1.0 mg/dL   Nitrite NEGATIVE  NEGATIVE   Leukocytes, UA SMALL (*) NEGATIVE  URINE CULTURE     Status: None   Collection Time    07/14/13  1:44 PM      Result Value Ref Range   Specimen Description URINE, CLEAN CATCH     Special Requests NONE     Culture  Setup Time       Value: 07/14/2013 19:52     Performed at [facility]       Value: >=100,000 COLONIES/ML     Performed at [facility]   Culture       Value: Multiple bacterial morphotypes present, none predominant. Suggest appropriate recollection if clinically indicated.     Performed at [facility]   Report Status 07/15/2013 FINAL    URINE MICROSCOPIC-ADD ON     Status: Abnormal   Collection Time    07/14/13  1:44 PM      Result Value Ref Range   Squamous Epithelial / LPF FEW (*) RARE   WBC, UA 3-6  <3 WBC/hpf  RBC / HPF 0-2  <3 RBC/hpf   Bacteria, UA FEW (*) RARE  GLUCOSE, CAPILLARY     Status: Abnormal   Collection Time    07/14/13  4:32 PM      Result Value Ref Range   Glucose-Capillary 147 (*) 70 - 99 mg/dL  GLUCOSE, CAPILLARY     Status: Abnormal   Collection Time    07/14/13  9:03 PM      Result Value Ref Range   Glucose-Capillary 133 (*) 70 - 99 mg/dL  GLUCOSE, CAPILLARY     Status: None   Collection Time    07/15/13  7:20 AM      Result Value Ref Range   Glucose-Capillary 96  70 - 99 mg/dL   Comment 1 Notify RN    GLUCOSE, CAPILLARY     Status: None   Collection Time    07/15/13 11:28 AM      Result Value Ref Range   Glucose-Capillary 90  70 - 99 mg/dL   Comment 1 Notify RN    GLUCOSE, CAPILLARY     Status: Abnormal   Collection Time    07/15/13  4:32 PM      Result Value Ref Range   Glucose-Capillary 115 (*) 70 - 99 mg/dL  GLUCOSE,  CAPILLARY     Status: Abnormal   Collection Time    07/15/13  9:01 PM      Result Value Ref Range   Glucose-Capillary 124 (*) 70 - 99 mg/dL  CREATININE, SERUM     Status: None   Collection Time    07/16/13  5:52 AM      Result Value Ref Range   Creatinine, Ser 0.72  0.50 - 1.10 mg/dL   GFR calc non Af Amer >90  >90 mL/min   GFR calc Af Amer >90  >90 mL/min   Comment: (NOTE)     The eGFR has been calculated using the CKD EPI equation.     This calculation has not been validated in all clinical situations.     eGFR's persistently <90 mL/min signify possible Chronic Kidney     Disease.  GLUCOSE, CAPILLARY     Status: Abnormal   Collection Time    07/16/13  7:24 AM      Result Value Ref Range   Glucose-Capillary 106 (*) 70 - 99 mg/dL   Comment 1 Notify RN    GLUCOSE, CAPILLARY     Status: Abnormal   Collection Time    07/16/13 11:16 AM      Result Value Ref Range   Glucose-Capillary 117 (*) 70 - 99 mg/dL   Comment 1 Notify RN    GLUCOSE, CAPILLARY     Status: Abnormal   Collection Time    07/16/13  4:23 PM      Result Value Ref Range   Glucose-Capillary 113 (*) 70 - 99 mg/dL  GLUCOSE, CAPILLARY     Status: Abnormal   Collection Time    07/16/13  9:52 PM      Result Value Ref Range   Glucose-Capillary 112 (*) 70 - 99 mg/dL     HEENT: normal Cardio: RRR and no murmurs Resp: CTA B/L GI: BS positive and NT,ND Extremity:  No Edema Skin:   Intact Neuro:   more alert today. Delayed processing, Abnormal Sensory decreaswed sensation in both hands and feet to LT, Abnormal Motor 4- Left delt bi, tri, grip, 4+ on Right sided, 3-/5 LLE, 4/5 RLE, Abnormal FMC Ataxic/ dec  [place] and Tone  Hypertonia and Dysarthric Musc/Skel:  Other no pain with AAROM of limbs Gen NAD Psych: flat   Assessment/Plan: 1. Functional deficits secondary to MS exacerbation which require 3+ hours per day of interdisciplinary therapy in a comprehensive inpatient rehab setting. Physiatrist is providing close  team supervision and 24 hour management of active medical problems listed below. Physiatrist and rehab team continue to assess barriers to discharge/monitor patient progress toward functional and medical goals.  FIM: FIM - Bathing Bathing Steps Patient Completed: Chest;Right Arm;Left Arm;Abdomen;Right upper leg;Left upper leg;Front perineal area;Buttocks;Right lower leg (including foot) (bed level - supine) Bathing: 4: Min-Patient completes 8-9 [id number] parts or 75+ percent  FIM - Upper Body Dressing/Undressing Upper body dressing/undressing steps patient completed: Thread/unthread right sleeve of pullover shirt/dresss Upper body dressing/undressing: 2: Max-Patient completed 25-49% of tasks FIM - Lower Body Dressing/Undressing Lower body dressing/undressing steps patient completed: Thread/unthread right pants leg;Don/Doff right sock Lower body dressing/undressing: 2: Max-Patient completed 25-49% of tasks  FIM - Toileting Toileting steps completed by patient: Performs perineal hygiene Toileting: 2: Max-Patient completed 1 of 3 steps  FIM - TRadio producerDevices: BRecruitment consultantTransfers: 1-Two helpers  FIM - BControl and instrumentation engineerDevices: Arm rests Bed/Chair Transfer: 4: Supine > Sit: Min A (steadying Pt. > 75%/lift 1 leg);3: Bed > Chair or W/C: Mod A (lift or lower assist)  FIM - Locomotion: Wheelchair Distance: 90 Locomotion: Wheelchair: 0: Activity did not occur FIM - Locomotion: Ambulation Locomotion: Ambulation Assistive Devices: Other (comment);Walker - Rolling (ACE bandage at bilat ankles for dorsiflexion assist) Ambulation/Gait Assistance: Not tested (comment) Locomotion: Ambulation: 0: Activity did not occur  Comprehension Comprehension Mode: Auditory Comprehension: 3-Understands basic 50 - 74% of the time/requires cueing 25 - 50%  of the time  Expression Expression Mode: Verbal Expression: 3-Expresses basic  50 - 74% of the time/requires cueing 25 - 50% of the time. Needs to repeat parts of sentences.  Social Interaction Social Interaction: 3-Interacts appropriately 50 - 74% of the time - May be physically or verbally inappropriate.  Problem Solving Problem Solving: 2-Solves basic 25 - 49% of the time - needs direction more than half the time to initiate, plan or complete simple activities  Memory Memory: 1-Recognizes or recalls less than 25% of the time/requires cueing greater than 75% of the time  Medical Problem List and Plan:  1. Functional deficits secondary to MS exacerbation.Solumedrol 5 day course completed  2. DVT Prophylaxis/Anticoagulation: Subcutaneous Lovenox.Monitor platelet count any signs of bleeding  3. Pain Management: tylenol as needed  4. Mood: Wellbutrin 150 mg daily. Followup per psychiatry services.provide emotional support  5. Neuropsych: This patient is not capable of making decisions on her own behalf.  6.Hypertension.Norvasc 5 mg daily,Lopressor 25 mg twice daily. bp's controlled 7.Diabetes Mellitus with peripheral neuropathy. Latest hemoglobin A1c 5.2.   -Patient on Glucophage 500 mg daily prior to admission.  -no meds at present  -reasonable control  LOS (Days) 8 A FACE TO FACE EVALUATION WAS PERFORMED  KIRSTEINS,ANDREW E 07/17/2013, 6:40 AM

## 2013-07-17 NOTE — Plan of Care (Signed)
Problem: RH Tub/Shower Transfers Goal: LTG Patient will perform tub/shower transfers w/assist (OT) LTG: Patient will perform tub/shower transfers with assist, with/without cues using equipment (OT) Outcome: Not Applicable Date Met:  12/39/35 Goal discharged secondary to PTA, patient took sponge bathes

## 2013-07-17 NOTE — Progress Notes (Signed)
Speech Language Pathology Weekly Progress and Session Note  Patient Details  Name: [name] [name] MRN: [id number] Date of Birth: 02-08-1970  Beginning of progress report period: July 10, 2013 End of progress report period: July 17, 2013  Today's Date: 07/17/2013 Time: 5916-3846 Time Calculation (min): 40 min  Short Term Goals: Week 1: SLP Short Term Goal 1 (Week 1): Pt will improve short term memory for recall of daily events and information for 75% accuracy with mod assist  SLP Short Term Goal 1 - Progress (Week 1): Progressing toward goal SLP Short Term Goal 2 (Week 1): Pt will improve executive function for sequencing, organization, and error awareness during basic tasks for 75% accuracy with mod assist.  SLP Short Term Goal 2 - Progress (Week 1): Met SLP Short Term Goal 3 (Week 1): Pt will tolerate her currently prescribed diet with no overt s/s of aspiration with modified independence  SLP Short Term Goal 3 - Progress (Week 1): Progressing toward goal SLP Short Term Goal 4 (Week 1): Pt will selectively attend to functional tasks in a minimally distracting environment for 7-10 minutes with min cuing for redirection.   SLP Short Term Goal 4 - Progress (Week 1): Met SLP Short Term Goal 5 (Week 1): Pt will improve her speech intelligibility to 80% accurate at the conversational level with min cuing for use of compensatory strategies.   SLP Short Term Goal 5 - Progress (Week 1): Met    New Short Term Goals: Week 2: SLP Short Term Goal 1 (Week 2): Pt will improve short term memory for recall of daily events and information for 75% accuracy with mod assist  SLP Short Term Goal 2 (Week 2): Pt will improve executive function for sequencing, organization, and error awareness during basic tasks for 75% accuracy with min-mod assist.  SLP Short Term Goal 3 (Week 2): Pt will selectively attend to functional tasks in a minimally distracting environment for 7-10 minutes with supervision cues for  redirection.   SLP Short Term Goal 4 (Week 2): Pt will tolerate her currently prescribed diet with no overt s/s of aspiration with modified independence  SLP Short Term Goal 5 (Week 2): Pt will improve her speech intelligibility to 80% accurate at the conversational level with supervision cues for use of compensatory strategies.    Weekly Progress Updates:  Pt made functional gains this reporting period and met 3 out of 5 short term goals due to improved sustained attention, basic problem solving, and speech intelligibility.  Pt also presents with improvements for processing speed during both structured and unstructured therapeutic tasks. Overall, pt currently requires mod assist for cognitive tasks and is tolerating her current diet with no overt s/s of aspiration.  Pt would continue to benefit from skilled speech therapy while inpatient to improve cognitive-linguistic function to maximize functional independence and reduce burden of care upon discharge.     Intensity: Minumum of 1-2 x/day, 30 to 90 minutes Frequency: 5 out of 7 days Duration/Length of Stay: 14-21 days Treatment/Interventions: Cognitive remediation/compensation;Cueing hierarchy;Patient/family education;Internal/external aids;Oral motor exercises;Dysphagia/aspiration precaution training   Daily Session  Skilled Therapeutic Interventions: Pt was seen for skilled speech therapy targeting recall and executive function.  Pt recalled at least one detail from yesterday's therapy session with min questioning cues targeting delayed recall of daily events and information. During a structured generative naming task, pt benefited from mod-max assist verbal cues for thought organization and mental flexibility; however, pt presented with modest improvements for processing speed which facilitated improved  executive functioning.  Pt's also presents with improved speech intelligibility during functional conversations and was approximately 90%  intelligible in context, with breakdown occurring due to decreased volume or fast rate of speech.   Goals updated to reflect current progress and plan of care.       FIM:  Comprehension Comprehension Mode: Auditory Comprehension: 3-Understands basic 50 - 74% of the time/requires cueing 25 - 50%  of the time Expression Expression Mode: Verbal Expression: 3-Expresses basic 50 - 74% of the time/requires cueing 25 - 50% of the time. Needs to repeat parts of sentences. Social Interaction Social Interaction: 3-Interacts appropriately 50 - 74% of the time - May be physically or verbally inappropriate. Problem Solving Problem Solving: 2-Solves basic 25 - 49% of the time - needs direction more than half the time to initiate, plan or complete simple activities Memory Memory: 1-Recognizes or recalls less than 25% of the time/requires cueing greater than 75% of the time General  Amount of Missed SLP Time (min): 5 Minutes Pain Pain Assessment Pain Assessment: No/denies pain Pain Score: 0-No pain  Therapy/Group: Individual Therapy  Windell Moulding, M.A. CCC-SLP  Page, [name] 07/17/2013, 1:06 PM

## 2013-07-17 NOTE — Progress Notes (Signed)
Occupational Therapy Session Notes  Patient Details  Name: [name] [name] MRN: [id number] Date of Birth: 05/01/1970  Today's Date: 07/17/2013 Time: 1100-1200 and 200-230 Time Calculation (min): 60 min and 30 min  Short Term Goals: Week 2:  OT Short Term Goal 1 (Week 2): Patient will bathe self with mod asist OT Short Term Goal 2 (Week 2): Patient will dress upper body with mod assist OT Short Term Goal 3 (Week 2): Patient will dress lower body with mod assist OT Short Term Goal 4 (Week 2): Patient will transfer to bedside commode with mod assist OT Short Term Goal 5 (Week 2): Patient will sit unsupported at edge of bed or on commode with min assist  Skilled Therapeutic Interventions/Progress Updates:  1)  Patient resting in w/c upon arrival.  Engaged in self care retraining to include sponge bath at sink and dressing.  Focused session on activity tolerance, sit><stand, standing tolerance and static and dynamic standing balance.  Patient did not have any clothes therefore a pair of pants and a shirt were obtained from our closet of new clothes. Patient with difficulty sustaining stand to wash bottom and to pull up pants therefore had to sit back down several times.  Patient states that HHA comes to assist her with a bath however, she could not remember the days of the week that helper comes.  Near end of session, patient's son arrived and confirmed that patient only takes sponge baths and that a HHA comes 3 days a week to assist with a sponge bath.  2)  Patient resting in w/c upon arrival and having just completed her physical therapy session.  Reports feeling very tired and wanted to go to the bathroom then get in bed.  Focused session on activity tolerance and endurance, w/c>BSC>bed stand/squat pivot transfers, standing tolerance and balance.  Patient unable to perform clothing management and required max assist for transfers.  Therapy Documentation Precautions:  Precautions Precautions:  Fall Restrictions Weight Bearing Restrictions: No Pain: No report of pain in either session ADL: See FIM for current functional status  Therapy/Group: Individual Therapy both sessions  SHAFFER, CHRISTINA 07/17/2013, 4:33 PM

## 2013-07-18 ENCOUNTER — Inpatient Hospital Stay (HOSPITAL_COMMUNITY): Payer: Medicare Other | Admitting: Occupational Therapy

## 2013-07-18 ENCOUNTER — Inpatient Hospital Stay (HOSPITAL_COMMUNITY): Payer: Self-pay | Admitting: Physical Therapy

## 2013-07-18 ENCOUNTER — Inpatient Hospital Stay (HOSPITAL_COMMUNITY): Payer: Medicare Other | Admitting: *Deleted

## 2013-07-18 DIAGNOSIS — E876 Hypokalemia: Secondary | ICD-10-CM | POA: Diagnosis not present

## 2013-07-18 DIAGNOSIS — G35 Multiple sclerosis: Secondary | ICD-10-CM | POA: Diagnosis not present

## 2013-07-18 DIAGNOSIS — F3289 Other specified depressive episodes: Secondary | ICD-10-CM

## 2013-07-18 DIAGNOSIS — K5900 Constipation, unspecified: Secondary | ICD-10-CM

## 2013-07-18 DIAGNOSIS — F329 Major depressive disorder, single episode, unspecified: Secondary | ICD-10-CM | POA: Diagnosis not present

## 2013-07-18 LAB — GLUCOSE, CAPILLARY
Glucose-Capillary: 92 mg/dL (ref 70–99)
Glucose-Capillary: 90 mg/dL (ref 70–99)
Glucose-Capillary: 102 mg/dL — ABNORMAL HIGH (ref 70–99)
Glucose-Capillary: 108 mg/dL — ABNORMAL HIGH (ref 70–99)

## 2013-07-18 NOTE — Progress Notes (Signed)
Occupational Therapy Session Note  Patient Details  Name: [name] [name] MRN: [id number] Date of Birth: Jan 24, 1970  Today's Date: 07/18/2013 [id number] Time calculation=30 minutes  Short Term Goals: Week 2:  OT Short Term Goal 1 (Week 2): Patient will bathe self with mod asist OT Short Term Goal 2 (Week 2): Patient will dress upper body with mod assist OT Short Term Goal 3 (Week 2): Patient will dress lower body with mod assist OT Short Term Goal 4 (Week 2): Patient will transfer to bedside commode with mod assist OT Short Term Goal 5 (Week 2): Patient will sit unsupported at edge of bed or on commode with min assist  Skilled Therapeutic Interventions/Progress Updates: Offered tub/shower transfer via tub transfer bench.   Educated patient on its use, but she stated she was afraid of falling and would not try the transfer.   She elected to practice sit to stand at sink x 1 with CGA.   KPad placed on patient's mid cerival neck and bilateral shoulders following session due to c/o constant pain.   (RN gave Tylenol)  Patient was left in her room with safety belt and call belt in place.    Therapy Documentation Precautions:  Precautions Precautions: Fall Restrictions Weight Bearing Restrictions: No   Pain: 4/10 mid cervical neck and shoulder.  RN gave Tylenol and this clinician applied KPad  See FIM for current functional status  Therapy/Group: Individual Therapy  Bud Face [facility] 07/18/2013, 2:56 PM

## 2013-07-18 NOTE — Progress Notes (Signed)
[name] [name] is a 43 y.o. female [birth year] [id number]  Subjective: No new complaints. No new problems. Slept well. Feeling OK.  Objective: Vital signs in last 24 hours: Temp:  [97.5 F (36.4 C)-98.1 F (36.7 C)] 97.5 F (36.4 C) (06/27 0500) Pulse Rate:  [58-87] 87 (06/27 0913) Resp:  [16-20] 16 (06/27 0500) BP: (112-129)/(76-85) 112/76 mmHg (06/27 0913) SpO2:  [100 %] 100 % (06/27 0500) Weight change:  Last BM Date: 07/17/13  Intake/Output from previous day: 06/26 0701 - 06/27 0700 In: 720 [P.O.:720] Out: -  Last cbgs: CBG (last 3)   Recent Labs  07/17/13 2103 07/18/13 0736 07/18/13 1156  GLUCAP 164* 92 90     Physical Exam General: No apparent distress   HEENT: not dry Lungs: Normal effort. Lungs clear to auscultation, no crackles or wheezes. Cardiovascular: Regular rate and rhythm, no edema Abdomen: S/NT/ND; BS(+) Musculoskeletal:  unchanged Neurological: No new neurological deficits Wounds: N/A    Skin: clear   Mental state: Alert, oriented, cooperative, dysarthric    Lab Results: BMET    Component Value Date/Time   NA 142 07/10/2013 0625   K 3.7 07/10/2013 0625   CL 104 07/10/2013 0625   CO2 27 07/10/2013 0625   GLUCOSE 100* 07/10/2013 0625   BUN 15 07/10/2013 0625   CREATININE 0.72 07/16/2013 0552   CREATININE 0.76 09/17/2012 1415   CALCIUM 8.7 07/10/2013 0625   GFRNONAA >90 07/16/2013 0552   GFRAA >90 07/16/2013 0552   CBC    Component Value Date/Time   WBC 10.6* 07/10/2013 0625   RBC 4.45 07/10/2013 0625   HGB 14.3 07/10/2013 0625   HCT 40.4 07/10/2013 0625   PLT 269 07/10/2013 0625   MCV 90.8 07/10/2013 0625   MCH 32.1 07/10/2013 0625   MCHC 35.4 07/10/2013 0625   RDW 12.2 07/10/2013 0625   LYMPHSABS 3.0 07/10/2013 0625   MONOABS 1.3* 07/10/2013 0625   EOSABS 0.0 07/10/2013 0625   BASOSABS 0.0 07/10/2013 0625    Studies/Results: No results found.  Medications: I have reviewed the patient's current medications.  Assessment/Plan:   1.  Functional deficits secondary to MS exacerbation.Solumedrol 5 day course completed  2. DVT Prophylaxis/Anticoagulation: Subcutaneous Lovenox.Monitor platelet count any signs of bleeding  3. Pain Management: tylenol as needed  4. Mood: Wellbutrin 150 mg daily. Followup per psychiatry services.provide emotional support  5. Neuropsych: This patient is not capable of making decisions on her own behalf.  6.Hypertension.Norvasc 5 mg daily,Lopressor 25 mg twice daily. bp's controlled  7.Diabetes Mellitus with peripheral neuropathy. Latest hemoglobin A1c 5.2.  -Patient on Glucophage 500 mg daily prior to admission.  -no meds at present  -reasonable control   Cont current Rx     Length of stay, days: 9  [name] [name] , MD 07/18/2013, 3:35 PM

## 2013-07-18 NOTE — Progress Notes (Signed)
Physical Therapy Session Note  Patient Details  Name: [name] [name] Livingstone MRN: [id number] Date of Birth: 04-18-1970  Today's Date: 07/18/2013 Time: [id number] Time Calculation (min): 23 min  Short Term Goals: Week 1:  PT Short Term Goal 1 (Week 1): Pt will perform supine<>sit with max A and 75% cueing for initiation and sequencing. PT Short Term Goal 2 (Week 1): Pt will perform bed<>w/c transfer with Max A and 75% cueing for initiation and sequencing. PT Short Term Goal 3 (Week 1): Pt will perform w/c mobility x20' in controlled environment with max A and 75% cueing. PT Short Term Goal 4 (Week 1): Pt will perform sit<>stand transfer from w/c with max A and 75% cueing.  Skilled Therapeutic Interventions/Progress Updates:    Patient received sitting in wheelchair. Session focused on initiation and sustained attention during simulated car transfer to car of low height (sedan-like). Patient performed simulated car transfer via stand pivot transfer, requires maxA for wheelchair>car (going L), modA for car>wheelchair (going R). Emphasis on anterior weight shift during transfer, patient expressing fear of falling. Patient requires minA for L LE management in/out of car. Patient left sitting in wheelchair with all needs within reach and seatbelt donned. Hot pack applied to cervical region secondary to c/o neck pain at end of session.  Therapy Documentation Precautions:  Precautions Precautions: Fall Restrictions Weight Bearing Restrictions: No Pain: Pain Assessment Pain Assessment: No/denies pain Pain Score: 0-No pain Locomotion : Ambulation Ambulation/Gait Assistance: Not tested (comment)   See FIM for current functional status  Therapy/Group: Individual Therapy  Chipper HerbBridget S Duriel Deery S. Ramyah Pankowski, PT, DPT 07/18/2013, 2:37 PM

## 2013-07-19 ENCOUNTER — Inpatient Hospital Stay (HOSPITAL_COMMUNITY): Payer: Self-pay | Admitting: Physical Therapy

## 2013-07-19 DIAGNOSIS — K5901 Slow transit constipation: Secondary | ICD-10-CM | POA: Diagnosis not present

## 2013-07-19 DIAGNOSIS — E119 Type 2 diabetes mellitus without complications: Secondary | ICD-10-CM | POA: Diagnosis not present

## 2013-07-19 DIAGNOSIS — F329 Major depressive disorder, single episode, unspecified: Secondary | ICD-10-CM | POA: Diagnosis not present

## 2013-07-19 DIAGNOSIS — F3289 Other specified depressive episodes: Secondary | ICD-10-CM | POA: Diagnosis not present

## 2013-07-19 DIAGNOSIS — G35 Multiple sclerosis: Secondary | ICD-10-CM | POA: Diagnosis not present

## 2013-07-19 LAB — GLUCOSE, CAPILLARY
GLUCOSE-CAPILLARY: 100 mg/dL — AB (ref 70–99)
GLUCOSE-CAPILLARY: 130 mg/dL — AB (ref 70–99)
Glucose-Capillary: 96 mg/dL (ref 70–99)
Glucose-Capillary: 105 mg/dL — ABNORMAL HIGH (ref 70–99)

## 2013-07-19 NOTE — Progress Notes (Signed)
[name] [name] is a 43 y.o. female 02/19/1970 [id number]  Subjective: No new complaints. No new problems. Slept well. Feeling OK. Eating breakfast  Objective: Vital signs in last 24 hours: Temp:  [97.6 F (36.4 C)-98 F (36.7 C)] 97.7 F (36.5 C) (06/28 0548) Pulse Rate:  [62-87] 62 (06/28 0807) Resp:  [17-18] 18 (06/28 0548) BP: (111-126)/(76-88) 126/81 mmHg (06/28 0807) SpO2:  [100 %] 100 % (06/28 0548) Weight change:  Last BM Date: 07/17/13  Intake/Output from previous day:   Last cbgs: CBG (last 3)   Recent Labs  07/18/13 1623 07/18/13 2043 07/19/13 0725  GLUCAP 102* 108* 100*     Physical Exam General: No apparent distress   HEENT: not dry Lungs: Normal effort. Lungs clear to auscultation, no crackles or wheezes. Cardiovascular: Regular rate and rhythm, no edema Abdomen: S/NT/ND; BS(+) Musculoskeletal:  unchanged Neurological: No new neurological deficits Wounds: N/A    Skin: clear   Mental state: Alert, oriented, cooperative, dysarthric    Lab Results: BMET    Component Value Date/Time   NA 142 07/10/2013 0625   K 3.7 07/10/2013 0625   CL 104 07/10/2013 0625   CO2 27 07/10/2013 0625   GLUCOSE 100* 07/10/2013 0625   BUN 15 07/10/2013 0625   CREATININE 0.72 07/16/2013 0552   CREATININE 0.76 09/17/2012 1415   CALCIUM 8.7 07/10/2013 0625   GFRNONAA >90 07/16/2013 0552   GFRAA >90 07/16/2013 0552   CBC    Component Value Date/Time   WBC 10.6* 07/10/2013 0625   RBC 4.45 07/10/2013 0625   HGB 14.3 07/10/2013 0625   HCT 40.4 07/10/2013 0625   PLT 269 07/10/2013 0625   MCV 90.8 07/10/2013 0625   MCH 32.1 07/10/2013 0625   MCHC 35.4 07/10/2013 0625   RDW 12.2 07/10/2013 0625   LYMPHSABS 3.0 07/10/2013 0625   MONOABS 1.3* 07/10/2013 0625   EOSABS 0.0 07/10/2013 0625   BASOSABS 0.0 07/10/2013 0625    Studies/Results: No results found.  Medications: I have reviewed the patient's current medications.  Assessment/Plan:   1. Functional deficits secondary to MS  exacerbation.Solumedrol 5 day course completed  2. DVT Prophylaxis/Anticoagulation: Subcutaneous Lovenox.Monitor platelet count any signs of bleeding  3. Pain Management: tylenol as needed  4. Mood: Wellbutrin 150 mg daily. Followup per psychiatry services.provide emotional support  5. Neuropsych: This patient is not capable of making decisions on her own behalf.  6.Hypertension.Norvasc 5 mg daily,Lopressor 25 mg twice daily. bp's controlled  7.Diabetes Mellitus with peripheral neuropathy. Latest hemoglobin A1c 5.2.  -Patient on Glucophage 500 mg daily prior to admission.  -no meds at present  -reasonable control   Cont current Rx     Length of stay, days: 10  Sonda PrimesAlex Plotnikov , MD 07/19/2013, 8:31 AM

## 2013-07-19 NOTE — Progress Notes (Signed)
Physical Therapy Session Note  Patient Details  Name: [name] [name] MRN: [id number] Date of Birth: 1971/01/16  Today's Date: 07/19/2013 Time: [id number] Time Calculation (min): 65 min  Short Term Goals: Week 1: PT Short Term Goal 1 (Week 1): Pt will perform supine<>sit with max A and 75% cueing for initiation and sequencing.  PT Short Term Goal 2 (Week 1): Pt will perform bed<>w/c transfer with Max A and 75% cueing for initiation and sequencing.  PT Short Term Goal 3 (Week 1): Pt will perform w/c mobility x20' in controlled environment with max A and 75% cueing.  PT Short Term Goal 4 (Week 1): Pt will perform sit<>stand transfer from w/c with max A and 75% cueing.  Skilled Therapeutic Interventions/Progress Updates:    Pt perseverative throughout session on toileting activities despite attempts prior to and during session with no bowel movement performed. Pt with increased flatulence noted s/p education and positioning to promote bowel movement. Pt anxious about falling anteriorly, actively resisting facilitation of anterior weight shift despite it being a limiting factor in transfers. Pt would continue to benefit from skilled PT services to increase functional mobility.  Therapy Documentation Precautions:  Precautions Precautions: Fall Restrictions Weight Bearing Restrictions: No Pain: Pain Assessment Pain Assessment: No/denies pain Mobility:  Pt is Mod A for sit to stand, toilet, and squat pivot transfers with cues for weight shift, techniques, and hand placement. Balance:  Sitting balance MinGuard to Min A static and Min A/Mod A reaching to min and mod excursion. Pt Max A in dynamic standing tasks reaching to min excursion   Other Treatments:  Pt performs anterior weight shifts, reaching anterior and L 3x10 in session. Pt performs repeated transfers x10 in session. Pt educated on using trunk and hip flexion for bowel expulsion with ventilatory cues and assisted into position.  Lumbar spine soft tissue stretch performed 3x30" in sitting. Facilitation of upright posture and midline performed multiple times in session. Pt performs bridging 2x10. Insight and safety awareness training performed in session. Pt left sitting in bedside chair with needs in reach.  See FIM for current functional status  Therapy/Group: Individual Therapy  Christia ReadingKinney, Jaire Pinkham G 07/19/2013, 10:58 AM

## 2013-07-20 ENCOUNTER — Encounter (HOSPITAL_COMMUNITY): Payer: Self-pay | Admitting: Occupational Therapy

## 2013-07-20 ENCOUNTER — Inpatient Hospital Stay (HOSPITAL_COMMUNITY): Payer: Self-pay | Admitting: Physical Therapy

## 2013-07-20 ENCOUNTER — Inpatient Hospital Stay (HOSPITAL_COMMUNITY): Payer: Self-pay | Admitting: Speech Pathology

## 2013-07-20 DIAGNOSIS — G35 Multiple sclerosis: Secondary | ICD-10-CM

## 2013-07-20 DIAGNOSIS — G8250 Quadriplegia, unspecified: Secondary | ICD-10-CM

## 2013-07-20 LAB — GLUCOSE, CAPILLARY
GLUCOSE-CAPILLARY: 102 mg/dL — AB (ref 70–99)
Glucose-Capillary: 88 mg/dL (ref 70–99)
Glucose-Capillary: 94 mg/dL (ref 70–99)
Glucose-Capillary: 105 mg/dL — ABNORMAL HIGH (ref 70–99)

## 2013-07-20 NOTE — Progress Notes (Signed)
Social Work Patient ID: [name] [name], female   DOB: Feb 08, 1970, 43 y.o.   MRN: [id number] Contacted [facility] to schedule a wheelchair eval for pt.  Lorin Picket can be here tomorrow at 9;00.  Will let team know.

## 2013-07-20 NOTE — Progress Notes (Signed)
Physical Therapy Weekly Progress Note  Patient Details  Name: [name] [name] MRN: [id number] Date of Birth: 19-Dec-1970  Beginning of progress report period: July 11, 2013 End of progress report period: July 20, 2013  Today's Date: 07/20/2013 Time: 1030-1120 and 0093-8182 Time Calculation (min): 50 min and 28 min  Patient has met 4 of 4 short term goals.  Pt demonstrates significant improvement in participation, postural stability, and independence with bed mobility, functional transfers, and w/c mobility.  Patient continues to demonstrate the following deficits: muscle weakness, abnormal tone and unbalanced muscle activation, decreased midline orientation and decreased motor planning, decreased initiation, decreased attention, decreased awareness, decreased problem solving, decreased memory and delayed processing, decreased standing balance, and decreased balance strategies and dizziness/disequilibrium of central origin and therefore will continue to benefit from skilled PT intervention to enhance overall performance with activity tolerance, balance, postural control, ability to compensate for deficits, attention, awareness and coordination.  Patient progressing toward long term goals.   Continue plan of care.  PT Short Term Goals Week 1:  PT Short Term Goal 1 (Week 1): Pt will perform supine<>sit with max A and 75% cueing for initiation and sequencing. PT Short Term Goal 1 - Progress (Week 1): Met PT Short Term Goal 2 (Week 1): Pt will perform bed<>w/c transfer with Max A and 75% cueing for initiation and sequencing. PT Short Term Goal 2 - Progress (Week 1): Met PT Short Term Goal 3 (Week 1): Pt will perform w/c mobility x20' in controlled environment with max A and 75% cueing. PT Short Term Goal 3 - Progress (Week 1): Met PT Short Term Goal 4 (Week 1): Pt will perform sit<>stand transfer from w/c with max A and 75% cueing. PT Short Term Goal 4 - Progress (Week 1): Met Week 2:  PT Short  Term Goal 1 (Week 2): STG's=LTG's secondary to anticipated LOS  Skilled Therapeutic Interventions/Progress Updates:    Treatment Session 1: Pt performed w/c mobility x130', x100' in controlled environment with min A, increased time, HOH assist at L hand for effective obstacle negotiation during turning. In rehab apartment, pt performed stand pivot transfer from w/c>bed with rolling walker and min A, increased time, from bed>w/c with rolling walker and supervision. Performed sit>supine with increased time, HOB flat, no rails, verbal cueing for sequencing/technique, and min A for LLE management; supine>sit with supervision, increased time. Pt with no c/o dizziness/disequilibrium during bed mobility or w/c mobility. Per pt request to urinate, returned to room, where pt performed stand pivot transfer from w/c>bedside commode with rolling walker and min A, bedside commode>w/c with rolling walker and max A secondary to onset of significant fatigue. Discussed midline head posture with pt seated in front of mirror for visual feedback; pt able to achieve head at midline without pain but did report "it's really hard to keep it there," per pt. Therapist departed with pt seated in w/c with quick release belt on for safety and all needs within reach.  Treatment Session 2: Pt received seated in w/c; agreeable to therapy. Session focused on increasing pt independence with car transfers. Transported pt from room<>ortho gym in w/c with Total A secondary to pt fatigue. Performed stand pivot transfer from w/c<>simulated car with mod A, increased time, and no assistive device. Once seated in simulated car seat, pt able to manage bilat LE's without assist. Modified w/c brake locks with rubber tubing to increase pt independence with brake management. Session ended in pt room, where pt was left seated in w/c with  quick release belt on for safety and all needs within reach.  Therapy Documentation Precautions:   Precautions Precautions: Fall Restrictions Weight Bearing Restrictions: No Pain: Pain Assessment Pain Score: 0-No pain Locomotion : Wheelchair Mobility Distance: 1x130, 1x100   See FIM for current functional status  Therapy/Group: Individual Therapy  Hobble, Malva Cogan 07/20/2013, 12:47 PM

## 2013-07-20 NOTE — Progress Notes (Signed)
Occupational Therapy Session Note  Patient Details  Name: [name] [name] MRN: [id number] Date of Birth: 1970/10/09  Today's Date: 07/20/2013 Time: 2641-5830 Time Calculation (min): 60 min  Short Term Goals: Week 2:  OT Short Term Goal 1 (Week 2): Patient will bathe self with mod asist OT Short Term Goal 2 (Week 2): Patient will dress upper body with mod assist OT Short Term Goal 3 (Week 2): Patient will dress lower body with mod assist OT Short Term Goal 4 (Week 2): Patient will transfer to bedside commode with mod assist OT Short Term Goal 5 (Week 2): Patient will sit unsupported at edge of bed or on commode with min assist  Skilled Therapeutic Interventions/Progress Updates:    Patient seen this am for skilled OT intervention to address increased participation in ADL.  Patient clearly recognized this therapist by face this am.  Patient indicated need to use commode, and had continent episode.  Patient today demonstrating sufficient sitting balance to sit safely on BSC.  Patient lacking unsupported sitting balance to sit on edge of bed to don pants.  Able to don LB clothing with minimal assist using bridging, rolling combination while supine in bed.    Therapy Documentation Precautions:  Precautions Precautions: Fall Restrictions Weight Bearing Restrictions: No   Pain: Pain Assessment Pain Assessment: No/denies pain    See FIM for current functional status  Therapy/Group: Individual Therapy  Collier Salina 07/20/2013, 9:44 AM

## 2013-07-20 NOTE — Progress Notes (Signed)
Speech Language Pathology Daily Session Note  Patient Details  Name: [name] [name] MRN: [id number] Date of Birth: 15-May-1970  Today's Date: 07/20/2013 Time: 1305-1400 Time Calculation (min): 55 min  Short Term Goals: Week 2: SLP Short Term Goal 1 (Week 2): Pt will improve short term memory for recall of daily events and information for 75% accuracy with mod assist  SLP Short Term Goal 2 (Week 2): Pt will improve executive function for sequencing, organization, and error awareness during basic tasks for 75% accuracy with min-mod assist.  SLP Short Term Goal 3 (Week 2): Pt will improve executive function for sequencing, organization, and error awareness during basic tasks for 75% accuracy with min-mod assist.  SLP Short Term Goal 4 (Week 2): Pt will tolerate her currently prescribed diet with no overt s/s of aspiration with modified independence  SLP Short Term Goal 5 (Week 2): Pt will improve her speech intelligibility to 80% accurate at the conversational level with supervision cues for use of compensatory strategies.    Skilled Therapeutic Interventions:  Pt was seen for skilled speech therapy targeting family education and short term memory for daily events and information.  Pt's son was present for the duration of today's session.  As a result, SLP initiated skilled education related to pt's current goals and progress in therapy.  Pt's son verified that he provided assistance for medication and financial management in addition to all household tasks and ADLs.  Pt's son also reported that pt remained altered from her cognitive baseline; however she has made improvements towards baseline while inpatient.  Pt recognized SLP and independently recalled at least 1 activity from previous speech therapy sessions.  Additionally, pt recalled her physical therapist's name and at least 2 details from her daily therapy schedule with supervision question cues .  SLP reinforced use of schedule as an external  aid to maximize functional independence for recall of daily information.  Following reorientation to daily schedule, pt immediately recalled 3/3 therapists' names as well as their associated disciplines with question cues.  Continue per current plan of care.    FIM:  Comprehension Comprehension Mode: Auditory Comprehension: 4-Understands basic 75 - 89% of the time/requires cueing 10 - 24% of the time Expression Expression Mode: Verbal Expression: 4-Expresses basic 75 - 89% of the time/requires cueing 10 - 24% of the time. Needs helper to occlude trach/needs to repeat words. Social Interaction Social Interaction: 4-Interacts appropriately 75 - 89% of the time - Needs redirection for appropriate language or to initiate interaction. Problem Solving Problem Solving: 2-Solves basic 25 - 49% of the time - needs direction more than half the time to initiate, plan or complete simple activities Memory Memory: 2-Recognizes or recalls 25 - 49% of the time/requires cueing 51 - 75% of the time  Pain Pain Assessment Pain Assessment: No/denies pain  Therapy/Group: Individual Therapy  Jackalyn Lombard, M.A. CCC-SLP  Page, Melanee Spry 07/20/2013, 3:43 PM

## 2013-07-20 NOTE — Progress Notes (Signed)
[name] [name] is a 43 y.o. right-handed female with history of hypertension, diabetes mellitus with peripheral neuropathy as well as multiple sclerosis diagnosed 2007. Patient lives with her son and used a [employer] workercane/walker prior to admission. Presented 07/03/2013 with increasing weakness over the last 3-4 days as well as mild nausea with vomiting. There was report of a witnessed fall by her son striking her face without loss of consciousness. Cranial CT scan with no acute intracranial abnormalities. Severe changes of small vessel disease of the white matter consistent with multiple sclerosis. Placed on intravenous Solu-Medrol x5 days for suspect MS exacerbation completed 07/08/2013. Psychiatry was consulted in relation to depression placed on Wellbutrin  Subjective/Complaints: Mad about socks Wants to wear her own rather than hospital gripper socks Denies bowel or bladder issues no inc per RN Appetite fair 30-60% Full ROs limited by cognitive def  Review of Systems - limited by cognition  Objective: Vital Signs: Blood pressure 114/85, pulse 57, temperature 98 F (36.7 C), temperature source Oral, resp. rate 19, height 5\' 1"  (1.549 m), weight 54.341 kg (119 lb 12.8 oz), last menstrual period 08/23/2012, SpO2 100.00%. No results found. Results for orders placed during the hospital encounter of 07/09/13 (from the past 72 hour(s))  GLUCOSE, CAPILLARY     Status: None   Collection Time    07/17/13  7:41 AM      Result Value Ref Range   Glucose-Capillary 99  70 - 99 mg/dL   Comment 1 Notify RN    GLUCOSE, CAPILLARY     Status: None   Collection Time    07/17/13 12:25 PM      Result Value Ref Range   Glucose-Capillary 92  70 - 99 mg/dL   Comment 1 Notify RN    GLUCOSE, CAPILLARY     Status: None   Collection Time    07/17/13  4:21 PM      Result Value Ref Range   Glucose-Capillary 98  70 - 99 mg/dL   Comment 1 Notify RN    GLUCOSE, CAPILLARY     Status: Abnormal   Collection Time     07/17/13  9:03 PM      Result Value Ref Range   Glucose-Capillary 164 (*) 70 - 99 mg/dL  GLUCOSE, CAPILLARY     Status: None   Collection Time    07/18/13  7:36 AM      Result Value Ref Range   Glucose-Capillary 92  70 - 99 mg/dL  GLUCOSE, CAPILLARY     Status: None   Collection Time    07/18/13 11:56 AM      Result Value Ref Range   Glucose-Capillary 90  70 - 99 mg/dL  GLUCOSE, CAPILLARY     Status: Abnormal   Collection Time    07/18/13  4:23 PM      Result Value Ref Range   Glucose-Capillary 102 (*) 70 - 99 mg/dL  GLUCOSE, CAPILLARY     Status: Abnormal   Collection Time    07/18/13  8:43 PM      Result Value Ref Range   Glucose-Capillary 108 (*) 70 - 99 mg/dL  GLUCOSE, CAPILLARY     Status: Abnormal   Collection Time    07/19/13  7:25 AM      Result Value Ref Range   Glucose-Capillary 100 (*) 70 - 99 mg/dL  GLUCOSE, CAPILLARY     Status: None   Collection Time    07/19/13 11:46 AM  Result Value Ref Range   Glucose-Capillary 96  70 - 99 mg/dL  GLUCOSE, CAPILLARY     Status: Abnormal   Collection Time    07/19/13  4:41 PM      Result Value Ref Range   Glucose-Capillary 105 (*) 70 - 99 mg/dL  GLUCOSE, CAPILLARY     Status: Abnormal   Collection Time    07/19/13  9:10 PM      Result Value Ref Range   Glucose-Capillary 130 (*) 70 - 99 mg/dL     HEENT: normal Cardio: RRR and no murmurs Resp: CTA B/L GI: BS positive and NT,ND Extremity:  No Edema Skin:   Intact Neuro:   more alert today. Delayed processing, Abnormal Sensory decreaswed sensation in both hands and feet to LT, Abnormal Motor 4- Left delt bi, tri, grip, 4+ on Right sided, 3-/5 LLE, 4/5 RLE, Abnormal FMC Ataxic/ dec FMC and Tone  Hypertonia and Dysarthric Musc/Skel:  Other no pain with AAROM of limbs Gen NAD Psych: flat   Assessment/Plan: 1. Functional deficits secondary to MS exacerbation which require 3+ hours per day of interdisciplinary therapy in a comprehensive inpatient rehab  setting. Physiatrist is providing close team supervision and 24 hour management of active medical problems listed below. Physiatrist and rehab team continue to assess barriers to discharge/monitor patient progress toward functional and medical goals.  FIM: FIM - Bathing Bathing Steps Patient Completed: Chest;Right Arm;Left Arm;Abdomen;Right upper leg;Left upper leg;Front perineal area;Buttocks;Right lower leg (including foot) (bed level - supine) Bathing: 4: Min-Patient completes 8-9 40f 10 parts or 75+ percent  FIM - Upper Body Dressing/Undressing Upper body dressing/undressing steps patient completed: Thread/unthread right sleeve of pullover shirt/dresss Upper body dressing/undressing: 2: Max-Patient completed 25-49% of tasks FIM - Lower Body Dressing/Undressing Lower body dressing/undressing steps patient completed: Thread/unthread right pants leg;Don/Doff right sock Lower body dressing/undressing: 2: Max-Patient completed 25-49% of tasks  FIM - Toileting Toileting steps completed by patient: Performs perineal hygiene Toileting Assistive Devices: Grab bar or rail for support;Toilet Aid/prosthesis/orthosis Toileting: 1: Total-Patient completed zero steps, helper did all 3  FIM - Diplomatic Services operational officer Devices: Bedside commode Toilet Transfers: 2-To toilet/BSC: Max A (lift and lower assist);2-From toilet/BSC: Max A (lift and lower assist)  FIM - Press photographer Assistive Devices: Bed rails;Arm rests Bed/Chair Transfer: 3: Sit > Supine: Mod A (lifting assist/Pt. 50-74%/lift 2 legs);3: Chair or W/C > Bed: Mod A (lift or lower assist)  FIM - Locomotion: Wheelchair Distance: 100 Locomotion: Wheelchair: 0: Activity did not occur FIM - Locomotion: Ambulation Locomotion: Ambulation Assistive Devices: Other (comment);Walker - Rolling (ACE bandage at bilat ankles for dorsiflexion assist) Ambulation/Gait Assistance: Not tested (comment) Locomotion:  Ambulation: 0: Activity did not occur  Comprehension Comprehension Mode: Auditory Comprehension: 3-Understands basic 50 - 74% of the time/requires cueing 25 - 50%  of the time  Expression Expression Mode: Verbal Expression: 3-Expresses basic 50 - 74% of the time/requires cueing 25 - 50% of the time. Needs to repeat parts of sentences.  Social Interaction Social Interaction: 3-Interacts appropriately 50 - 74% of the time - May be physically or verbally inappropriate.  Problem Solving Problem Solving: 3-Solves basic 50 - 74% of the time/requires cueing 25 - 49% of the time  Memory Memory: 3-Recognizes or recalls 50 - 74% of the time/requires cueing 25 - 49% of the time  Medical Problem List and Plan:  1. Functional deficits secondary to MS exacerbation.Solumedrol 5 day course completed  2. DVT Prophylaxis/Anticoagulation: Subcutaneous Lovenox.Monitor platelet  count any signs of bleeding  3. Pain Management: tylenol as needed  4. Mood: Wellbutrin 150 mg daily. Followup per psychiatry services.provide emotional support  5. Neuropsych: This patient is not capable of making decisions on her own behalf.  6.Hypertension.Norvasc 5 mg daily,Lopressor 25 mg twice daily. bp's controlled 7.Diabetes Mellitus with peripheral neuropathy. Latest hemoglobin A1c 5.2.   -Patient on Glucophage 500 mg daily prior to admission.  -no meds at present  -reasonable control  LOS (Days) 11 A FACE TO FACE EVALUATION WAS PERFORMED  [name] [name] E 07/20/2013, 7:27 AM

## 2013-07-21 ENCOUNTER — Encounter (HOSPITAL_COMMUNITY): Payer: Self-pay | Admitting: Occupational Therapy

## 2013-07-21 ENCOUNTER — Inpatient Hospital Stay (HOSPITAL_COMMUNITY): Payer: Self-pay | Admitting: Speech Pathology

## 2013-07-21 ENCOUNTER — Inpatient Hospital Stay (HOSPITAL_COMMUNITY): Payer: Self-pay | Admitting: Occupational Therapy

## 2013-07-21 ENCOUNTER — Inpatient Hospital Stay (HOSPITAL_COMMUNITY): Payer: Self-pay | Admitting: Physical Therapy

## 2013-07-21 DIAGNOSIS — G35 Multiple sclerosis: Secondary | ICD-10-CM

## 2013-07-21 DIAGNOSIS — G8250 Quadriplegia, unspecified: Secondary | ICD-10-CM

## 2013-07-21 LAB — GLUCOSE, CAPILLARY
GLUCOSE-CAPILLARY: 114 mg/dL — AB (ref 70–99)
Glucose-Capillary: 91 mg/dL (ref 70–99)
Glucose-Capillary: 82 mg/dL (ref 70–99)

## 2013-07-21 NOTE — Progress Notes (Signed)
Occupational Therapy Session Note  Patient Details  Name: RAYNELL STRITE MRN: [id number] Date of Birth: 1970/09/29  Today's Date: 07/21/2013 Time: 1430-1500 Time Calculation (min): 30 min  Short Term Goals: Week 2:  OT Short Term Goal 1 (Week 2): Patient will bathe self with mod asist OT Short Term Goal 2 (Week 2): Patient will dress upper body with mod assist OT Short Term Goal 3 (Week 2): Patient will dress lower body with mod assist OT Short Term Goal 4 (Week 2): Patient will transfer to bedside commode with mod assist OT Short Term Goal 5 (Week 2): Patient will sit unsupported at edge of bed or on commode with min assist  Skilled Therapeutic Interventions/Progress Updates:    Patient seen this pm for OT intervention to address functional mobility.  Patient required increased assistance to transfer from bed to wheelchair on carpet with slipper socks and rolling walker.  Patient with increased difficulty lifting legs into bed.  Attempted to use leg lifter to assist with this transition, ineffective on this attempt.    Therapy Documentation Precautions:  Precautions Precautions: Fall Restrictions Weight Bearing Restrictions: No   Pain:  Neck, right arm  See FIM for current functional status  Therapy/Group: Individual Therapy  Collier Salina 07/21/2013, 3:27 PM

## 2013-07-21 NOTE — Progress Notes (Signed)
Speech Language Pathology Daily Session Note  Patient Details  Name: [name] [name] MRN: [id number] Date of Birth: 10-07-1970  Today's Date: 07/21/2013 Time: 7510-2585 Time Calculation (min): 40 min  Short Term Goals: Week 2: SLP Short Term Goal 1 (Week 2): Pt will improve short term memory for recall of daily events and information for 75% accuracy with mod assist  SLP Short Term Goal 2 (Week 2): Pt will improve executive function for sequencing, organization, and error awareness during basic tasks for 75% accuracy with min-mod assist.  SLP Short Term Goal 3 (Week 2): Pt will selectively attend to functional tasks in a minimally distracting environment for 7-10 minutes with supervision cues for redirection.    SLP Short Term Goal 4 (Week 2): Pt will tolerate her currently prescribed diet with no overt s/s of aspiration with modified independence  SLP Short Term Goal 5 (Week 2): Pt will improve her speech intelligibility to 80% accurate at the conversational level with supervision cues for use of compensatory strategies.    Skilled Therapeutic Interventions:  Pt was seen for skilled speech therapy targeting executive function and memory.  Following reorientation to schedule with SLP, pt answered questions related to her therapy team and daily schedule via spaced retrieval methods for 75% accuracy with min assist.  Additionally, pt benefited from overall mod assist verbal cues and written visual aids to facilitate improved functional problem solving during a structured task.  Pt did not recall attempts to complete the aforementioned task over 2 previous therapy sessions last week; however, she exhibited significantly improved sustained attention, mental flexibility, and thought organization in comparison to previous attempts.  Continue per current plan of care.    FIM:  Comprehension Comprehension Mode: Auditory Comprehension: 4-Understands basic 75 - 89% of the time/requires cueing 10 - 24%  of the time Expression Expression Mode: Verbal Expression: 4-Expresses basic 75 - 89% of the time/requires cueing 10 - 24% of the time. Needs helper to occlude trach/needs to repeat words. Social Interaction Social Interaction: 4-Interacts appropriately 75 - 89% of the time - Needs redirection for appropriate language or to initiate interaction. Problem Solving Problem Solving: 4-Solves basic 75 - 89% of the time/requires cueing 10 - 24% of the time Memory Memory: 4-Recognizes or recalls 75 - 89% of the time/requires cueing 10 - 24% of the time FIM - Eating Eating Activity: 6: More than reasonable amount of time  Pain Pain Assessment Pain Assessment: No/denies pain  Therapy/Group: Individual Therapy  Jackalyn Lombard, M.A. CCC-SLP  Page, Melanee Spry 07/21/2013, 3:48 PM

## 2013-07-21 NOTE — Progress Notes (Signed)
Physical Therapy Session Note  Patient Details  Name: [name] [name] MRN: [id number] Date of Birth: [birth year]  Today's Date: 07/21/2013 Time: 0900-1000 Time Calculation (min): 60 min  Short Term Goals: Week 2:  PT Short Term Goal 1 (Week 2): STG's=LTG's secondary to anticipated LOS  Skilled Therapeutic Interventions/Progress Updates:    Session focused on evaluation for custom w/c to increase postural stability, prevent contracture/deformity, preserve skin integrity, facilitate safety, and maximize independence functional mobility. Mobility and seating specialist, Scott (ATP), present to perform evaluation during session, which focused on functional transfer training, w/c mobility, and increasing cervical spine muscle extensibility to promote more normalized/symmetrical head posture. After discussion with pt and ATP, all agreed on tentative plan to pursue Tilt-In-Space manual w/c.This therapist recommend that ATP contact pt's son (with consent from pt) to confirm w/c recommendation.  Performed contract-relax PNF alternating with prolonged passive stretch of R levator scapulae, R sternocleidomastoid, and R scalenes (x3 minutes each). With pt seated in w/c and mirror anterior to pt for visual feedback, provided multimodal cueing to increase L lateral flexion. Pt able to maintain midline head alignment for <10 seconds prior to reverting back to baseline position secondary to onset of muscle fatigue. Session ended in pt room, where pt was left seated in w/c with quick release belt on and all needs within reach.  Therapy Documentation Precautions:  Precautions Precautions: Fall Restrictions Weight Bearing Restrictions: No Vital Signs: Therapy Vitals Pulse Rate: 71 BP: 131/88 mmHg Pain: Pain Assessment Pain Assessment: No/denies pain Locomotion : Wheelchair Mobility Distance: 75   See FIM for current functional status  Therapy/Group: Individual Therapy  Hobble, [name] [name] 07/21/2013,  12:52 PM

## 2013-07-21 NOTE — Progress Notes (Signed)
[name] [name] is a 43 y.o. right-handed female with history of hypertension, diabetes mellitus with peripheral neuropathy as well as multiple sclerosis diagnosed 2007. Patient lives with her son and used a Youth worker prior to admission. Presented 07/03/2013 with increasing weakness over the last 3-4 days as well as mild nausea with vomiting. There was report of a witnessed fall by her son striking her face without loss of consciousness. Cranial CT scan with no acute intracranial abnormalities. Severe changes of small vessel disease of the white matter consistent with multiple sclerosis. Placed on intravenous Solu-Medrol x5 days for suspect MS exacerbation completed 07/08/2013. Psychiatry was consulted in relation to depression placed on Wellbutrin  Subjective/Complaints:  No problems reported overnite Cannot tell me who braided her hair  Full ROs limited by cognitive def  Review of Systems - limited by cognition  Objective: Vital Signs: Blood pressure 138/88, pulse 72, temperature 98.3 F (36.8 C), temperature source Oral, resp. rate 19, height 5\' 1"  (1.549 m), weight 54.341 kg (119 lb 12.8 oz), last menstrual period 08/23/2012, SpO2 100.00%. No results found. Results for orders placed during the hospital encounter of 07/09/13 (from the past 72 hour(s))  GLUCOSE, CAPILLARY     Status: None   Collection Time    07/18/13  7:36 AM      Result Value Ref Range   Glucose-Capillary 92  70 - 99 mg/dL  GLUCOSE, CAPILLARY     Status: None   Collection Time    07/18/13 11:56 AM      Result Value Ref Range   Glucose-Capillary 90  70 - 99 mg/dL  GLUCOSE, CAPILLARY     Status: Abnormal   Collection Time    07/18/13  4:23 PM      Result Value Ref Range   Glucose-Capillary 102 (*) 70 - 99 mg/dL  GLUCOSE, CAPILLARY     Status: Abnormal   Collection Time    07/18/13  8:43 PM      Result Value Ref Range   Glucose-Capillary 108 (*) 70 - 99 mg/dL  GLUCOSE, CAPILLARY     Status: Abnormal   Collection Time    07/19/13  7:25 AM      Result Value Ref Range   Glucose-Capillary 100 (*) 70 - 99 mg/dL  GLUCOSE, CAPILLARY     Status: None   Collection Time    07/19/13 11:46 AM      Result Value Ref Range   Glucose-Capillary 96  70 - 99 mg/dL  GLUCOSE, CAPILLARY     Status: Abnormal   Collection Time    07/19/13  4:41 PM      Result Value Ref Range   Glucose-Capillary 105 (*) 70 - 99 mg/dL  GLUCOSE, CAPILLARY     Status: Abnormal   Collection Time    07/19/13  9:10 PM      Result Value Ref Range   Glucose-Capillary 130 (*) 70 - 99 mg/dL  GLUCOSE, CAPILLARY     Status: Abnormal   Collection Time    07/20/13  7:21 AM      Result Value Ref Range   Glucose-Capillary 102 (*) 70 - 99 mg/dL   Comment 1 Notify RN    GLUCOSE, CAPILLARY     Status: None   Collection Time    07/20/13 11:24 AM      Result Value Ref Range   Glucose-Capillary 88  70 - 99 mg/dL   Comment 1 Notify RN    GLUCOSE, CAPILLARY  Status: None   Collection Time    07/20/13  4:03 PM      Result Value Ref Range   Glucose-Capillary 94  70 - 99 mg/dL  GLUCOSE, CAPILLARY     Status: Abnormal   Collection Time    07/20/13  8:57 PM      Result Value Ref Range   Glucose-Capillary 105 (*) 70 - 99 mg/dL     HEENT: normal Cardio: RRR and no murmurs Resp: CTA B/L GI: BS positive and NT,ND Extremity:  No Edema Skin:   Intact Neuro:   more alert today. Delayed processing, Abnormal Sensory decreaswed sensation in both hands and feet to LT, Abnormal Motor 4- Left delt bi, tri, grip, 4+ on Right sided, 3-/5 LLE, 4/5 RLE, Abnormal FMC Ataxic/ dec FMC and Tone  Hypertonia and Dysarthric Musc/Skel:  Other no pain with AAROM of limbs Gen NAD Psych: flat   Assessment/Plan: 1. Functional deficits secondary to MS exacerbation which require 3+ hours per day of interdisciplinary therapy in a comprehensive inpatient rehab setting. Physiatrist is providing close team supervision and 24 hour management of active medical  problems listed below. Physiatrist and rehab team continue to assess barriers to discharge/monitor patient progress toward functional and medical goals.  FIM: FIM - Bathing Bathing Steps Patient Completed: Chest;Right Arm;Left Arm;Abdomen;Front perineal area;Buttocks;Right upper leg;Left upper leg Bathing: 4: Min-Patient completes 8-9 [id number] 10 parts or 75+ percent  FIM - Upper Body Dressing/Undressing Upper body dressing/undressing steps patient completed: Thread/unthread right sleeve of pullover shirt/dresss;Pull shirt over trunk Upper body dressing/undressing: 3: Mod-Patient completed 50-74% of tasks FIM - Lower Body Dressing/Undressing Lower body dressing/undressing steps patient completed: Thread/unthread right pants leg;Thread/unthread left pants leg;Fasten/unfasten pants;Pull pants up/down Lower body dressing/undressing: 4: Min-Patient completed 75 plus % of tasks  FIM - Toileting Toileting steps completed by patient: Adjust clothing prior to toileting;Performs perineal hygiene Toileting Assistive Devices: Grab bar or rail for support;Toilet Aid/prosthesis/orthosis Toileting: 3: Mod-Patient completed 2 of 3 steps  FIM - Diplomatic Services operational officerToilet Transfers Toilet Transfers Assistive Devices: PsychiatristBedside commode Toilet Transfers: 4-To toilet/BSC: Min A (steadying Pt. > 75%);2-From toilet/BSC: Max A (lift and lower assist)  FIM - BankerBed/Chair Transfer Bed/Chair Transfer Assistive Devices: Therapist, occupationalWalker Bed/Chair Transfer: 5: Supine > Sit: Supervision (verbal cues/safety issues);4: Chair or W/C > Bed: Min A (steadying Pt. > 75%);4: Sit > Supine: Min A (steadying pt. > 75%/lift 1 leg);5: Bed > Chair or W/C: Supervision (verbal cues/safety issues)  FIM - Locomotion: Wheelchair Distance: 1x130, 1x100 Locomotion: Wheelchair: 2: Travels 50 - 149 ft with minimal assistance (Pt.>75%) FIM - Locomotion: Ambulation Locomotion: Ambulation Assistive Devices: Other (comment);Walker - Rolling (ACE bandage at bilat ankles for  dorsiflexion assist) Ambulation/Gait Assistance: Not tested (comment) Locomotion: Ambulation: 0: Activity did not occur  Comprehension Comprehension Mode: Auditory Comprehension: 4-Understands basic 75 - 89% of the time/requires cueing 10 - 24% of the time  Expression Expression Mode: Verbal Expression: 4-Expresses basic 75 - 89% of the time/requires cueing 10 - 24% of the time. Needs helper to occlude trach/needs to repeat words.  Social Interaction Social Interaction: 4-Interacts appropriately 75 - 89% of the time - Needs redirection for appropriate language or to initiate interaction.  Problem Solving Problem Solving: 2-Solves basic 25 - 49% of the time - needs direction more than half the time to initiate, plan or complete simple activities  Memory Memory: 2-Recognizes or recalls 25 - 49% of the time/requires cueing 51 - 75% of the time  Medical Problem List and Plan:  1. Functional  deficits secondary to MS exacerbation.Solumedrol 5 day course completed  2. DVT Prophylaxis/Anticoagulation: Subcutaneous Lovenox.Monitor platelet count any signs of bleeding  3. Pain Management: tylenol as needed  4. Mood: Wellbutrin 150 mg daily. Followup per psychiatry services.provide emotional support  5. Neuropsych: This patient is not capable of making decisions on her own behalf.  6.Hypertension.Norvasc 5 mg daily,Lopressor 25 mg twice daily. bp's controlled 7.Diabetes Mellitus with peripheral neuropathy. Latest hemoglobin A1c 5.2.   -Patient on Glucophage 500 mg daily prior to admission.  -no meds at present  -reasonable control  LOS (Days) 12 A FACE TO FACE EVALUATION WAS PERFORMED  KIRSTEINS,ANDREW E 07/21/2013, 6:40 AM

## 2013-07-21 NOTE — Progress Notes (Signed)
Occupational Therapy Session Note  Patient Details  Name: [name] [name] MRN: [id number] Date of Birth: 27-Dec-1970  Today's Date: 07/21/2013 Time: 0803-0900 Time Calculation (min): 57 min  Short Term Goals: Week 2:  OT Short Term Goal 1 (Week 2): Patient will bathe self with mod asist OT Short Term Goal 2 (Week 2): Patient will dress upper body with mod assist OT Short Term Goal 3 (Week 2): Patient will dress lower body with mod assist OT Short Term Goal 4 (Week 2): Patient will transfer to bedside commode with mod assist OT Short Term Goal 5 (Week 2): Patient will sit unsupported at edge of bed or on commode with min assist  Skilled Therapeutic Interventions/Progress Updates:    Patient seen this am for skilled OT intervention to address increased independence with basic self care skills.  Patient able to identify need to void, and safely transfer to bedside commode.  Patient continues to report "vertigo" with sudden movements, including turning wheelchair within room.  Patient is transitioning from sit to stand much more safely, however lacks strength and muscle endurance to stand long enough to safely dress lower body, therefore for increased independence, lower body dressing completed at bed level.    Therapy Documentation Precautions:  Precautions Precautions: Fall Restrictions Weight Bearing Restrictions: No Pain: Pain Assessment Pain Assessment: No/denies pain  See FIM for current functional status  Therapy/Group: Individual Therapy  Collier Salina 07/21/2013, 12:46 PM

## 2013-07-22 ENCOUNTER — Inpatient Hospital Stay (HOSPITAL_COMMUNITY): Payer: Self-pay | Admitting: Physical Therapy

## 2013-07-22 ENCOUNTER — Inpatient Hospital Stay (HOSPITAL_COMMUNITY): Payer: Self-pay | Admitting: Occupational Therapy

## 2013-07-22 ENCOUNTER — Inpatient Hospital Stay (HOSPITAL_COMMUNITY): Payer: Medicare Other | Admitting: Speech Pathology

## 2013-07-22 ENCOUNTER — Encounter (HOSPITAL_COMMUNITY): Payer: Self-pay | Admitting: Occupational Therapy

## 2013-07-22 LAB — GLUCOSE, CAPILLARY
GLUCOSE-CAPILLARY: 86 mg/dL (ref 70–99)
Glucose-Capillary: 93 mg/dL (ref 70–99)
Glucose-Capillary: 105 mg/dL — ABNORMAL HIGH (ref 70–99)
Glucose-Capillary: 81 mg/dL (ref 70–99)

## 2013-07-22 NOTE — Progress Notes (Signed)
Social Work Patient ID: [name] [name], female   DOB: September 15, 1970, 43 y.o.   MRN: [id number] Met with pt and spoke with son via telephone to discuss team conference progression toward goals and discharge 7/3. Have scheduled family education for tomorrow at 1;00 for the afternoon.  He also will have his witnesses here to do HCPOA forms. Will try to get notorary here at 2;00pm.  Pt feels [name] and thinks she is doing [name], but still requires care.  Son is aware of this and Will provide the care.  PT to address stairs at home and come up with a safe plan to get pt up them with son.  Work on discharge for Friday, see how family education goes.

## 2013-07-22 NOTE — Plan of Care (Signed)
Problem: RH Wheelchair Mobility Goal: LTG Patient will propel w/c in home environment (PT) LTG: Patient will propel wheelchair in home environment, # of feet with assistance (PT).  N/A, as pt will be discharging home with Tilt-in-Space w/c as pt is unable to functionally propel w/c in home environment.

## 2013-07-22 NOTE — Progress Notes (Signed)
Speech Language Pathology Daily Session Note  Patient Details  Name: [name] [name] MRN: [id number] Date of Birth: 11-23-1970  Today's Date: 07/22/2013 Time: 2458-0998 Time Calculation (min): 60 min  Short Term Goals: Week 2: SLP Short Term Goal 1 (Week 2): Pt will improve short term memory for recall of daily events and information for 75% accuracy with mod assist  SLP Short Term Goal 2 (Week 2): Pt will improve executive function for sequencing, organization, and error awareness during basic tasks for 75% accuracy with min-mod assist.  SLP Short Term Goal 3 (Week 2): Pt will selectively attend to functional tasks in a minimally distracting environment for 7-10 minutes with supervision cues for redirection.    SLP Short Term Goal 4 (Week 2): Pt will tolerate her currently prescribed diet with no overt s/s of aspiration with modified independence  SLP Short Term Goal 5 (Week 2): Pt will improve her speech intelligibility to 80% accurate at the conversational level with supervision cues for use of compensatory strategies.    Skilled Therapeutic Interventions: Skilled treatment session focused on addressing cognitive goals. Patient was asked to recall daily events and recalled 0; Mod cues to utilize external aids were effective at patient recalling general daily events.  Patient interrupted conversation with poor transition with need to use the bathroom.  Patient refused bedside commode and required Min verbal to sequence and organize task.  Continue plan of care.   FIM:  Comprehension Comprehension Mode: Auditory Comprehension: 4-Understands basic 75 - 89% of the time/requires cueing 10 - 24% of the time Expression Expression Mode: Verbal Expression: 4-Expresses basic 75 - 89% of the time/requires cueing 10 - 24% of the time. Needs helper to occlude trach/needs to repeat words. Social Interaction Social Interaction: 4-Interacts appropriately 75 - 89% of the time - Needs redirection for  appropriate language or to initiate interaction. Problem Solving Problem Solving: 4-Solves basic 75 - 89% of the time/requires cueing 10 - 24% of the time Memory Memory: 4-Recognizes or recalls 75 - 89% of the time/requires cueing 10 - 24% of the time FIM - Eating Eating Activity: 5: Set-up assist for open containers  Pain Pain Assessment Pain Assessment: No/denies pain  Therapy/Group: Individual Therapy  [name] [name]., CCC-SLP [phone]  [name] [name] 07/22/2013, 4:43 PM

## 2013-07-22 NOTE — Progress Notes (Signed)
Occupational Therapy Session Note  Patient Details  Name: [name] [name] MRN: [id number] Date of Birth: December 07, 1970  Today's Date: 07/22/2013 Time: 1115-1200 Time Calculation (min): 45 min  Skilled Therapeutic Interventions/Progress Updates:    Patient seen this am to address activity participation and functional mobility with basic self care skills.  Patient crying at the beginning of session, stating that she was sad, although could not verbalize what was upsetting to her.  Patient difficult to console initially, but did agree to work toward therapy goals.  Patient needing significantly  increased assistance for sitting balance, transfers, and bed mobility this am.  Strong posterior preference, decreased safety on bedside commode.  Spoke with nurse tech, and RN to determine if change in status noted by others.  Will monitor throughout the day.    Therapy Documentation Precautions:  Precautions Precautions: Fall Restrictions Weight Bearing Restrictions: No   Pain:  Neck pain    See FIM for current functional status  Therapy/Group: Individual Therapy  Collier Salina 07/22/2013, 3:59 PM

## 2013-07-22 NOTE — Progress Notes (Addendum)
Physical Therapy Session Note  Patient Details  Name: [name] [name] MRN: [id number] Date of Birth: 08-19-1970  Today's Date: 07/22/2013 Time:1345-1445 Time Calculation (min): 60 min  Short Term Goals: Week 2:  PT Short Term Goal 1 (Week 2): STG's=LTG's secondary to anticipated LOS  Skilled Therapeutic Interventions/Progress Updates:    Pt received seated in w/c; agreeable to therapy. Session focused on hands-on family training with son (primary caregiver) and son's fiance. In past sessions, this therapist has reiterated that 13 steps are significant barrier to safe D/C home secondary to pt being non-ambulatory and refusing to attempt stair negotiation. Son reporting that pt was able to negotiate stairs with hands-on assist PTA. With max pt coaxing, pt attempted negotiation of 4 stairs with bilat rails and assist of son. Pt forward-facing to ascend and backwards to descend. Son provided mod-max A during ascent of initial 2 stairs, total A of final 2 stairs. During backwards descent with +2A of son and this PT, pt refusing to continue, demonstrating significant bilat hip/knee flexion and stating "I'm going to fall; just let go." PT/son slowly lowered pt to sit of thighs of PT and son for rest break, attempt to calm pt. Within 1 minute of sitting, pt agreeable to complete descent of final 2 stairs, which pt performed with +2A. Explained, demonstrated two-person arm carry technique to son/fiance, who gave effective return demonstration of technique carrying pt while negotiating 3 stairs with +2Total A. Per son, carrying technique perceived to be much easier and safer than assisting with stair negotiation even PTA.   Pt performed w/c mobility x75' in controlled environment with supervision to min A for obstacle negotiation on L side. Son gave effective return demonstration of providing min A with stand pivot transfers using rolling walker. Son declined training with car transfer secondary to pt fatigue  and son feeling comfortable with transfer.  Final 30 minutes of session focused on education concerning custom w/c options. Seated and positioning specialist, [name] [name], present with manual Tilt-In-Space trial w/c. [name] [name], this therapist, pt, and son engaged in interactive conversation concerning rationale for selection of w/c type, management of w/c parts, and breakdown of w/c for transport. Adjustments made concurrently with education/discussion. When given selection between manual Breezy and manual Tilt-In-Space w/c, both pt and son verbally selected Tilt-in-Space. Session ended in pt room, where pt was left seated in Tilt-In-Space w/c with seat belt on, son/fiance present, and all needs within reach.  Therapy Documentation Precautions:  Precautions Precautions: Fall Restrictions Weight Bearing Restrictions: No Vital Signs: Therapy Vitals BP: 112/82 mmHg Pain: Pain Assessment Pain Assessment: No/denies pain Pain Score: 0-No pain  Modified LTG addressing negotiation of 13 stairs to +2A secondary to change in planned method of stair negotiation. Addendum: The following long term goals were downgraded: day to day recall/carryover; w/c distance in controlled environment. D/C'ed home w/c goal, as pt unable to functionally propel w/c in home environment.  See FIM for current functional status  Therapy/Group: Individual Therapy  [name] [name], [name] [name] 07/22/2013, 10:54 AM

## 2013-07-22 NOTE — Progress Notes (Signed)
Occupational Therapy Session Note  Patient Details  Name: [name] [name] MRN: [id number] Date of Birth: [birth year]  Today's Date: 07/22/2013 Time: [id number] Time Calculation (min): 30 min   Skilled Therapeutic Interventions/Progress Updates:    Patient seen this pm for OT intervention.  Patient's son present and patient laughing and joking with family.  Family education with son, patient transferred to bedside commode with min assist - significantly improved from am session.  Son indicates that variation in performance is common and is exaggerated by change in schedules / team members.    Therapy Documentation Precautions:  Precautions Precautions: Fall Restrictions Weight Bearing Restrictions: No   Pain:   Neck pain - discussed potential for injection in neck to address spasticity in neck muscles     See FIM for current functional status  Therapy/Group: Individual Therapy  [name] [name], [name] [name] 07/22/2013, 4:12 PM

## 2013-07-22 NOTE — Progress Notes (Signed)
Social Work Lucy Chrisebecca G Kyarah Enamorado, LCSW Social Worker Signed  Patient Care Conference Service date: 07/22/2013 1:49 PM  Inpatient RehabilitationTeam Conference and Plan of Care Update Date: 07/22/2013   Time: 11;00 Am     Patient Name: [name] [name]       Medical Record Number: [id number]   Date of Birth: August 31, 1970 Sex: Female         Room/Bed: 4M08C/4M08C-01 Payor Info: Payor: MEDICARE / Plan: MEDICARE PART A AND B / Product Type: *No Product type* /   Admitting Diagnosis: MS exac  yes SLP   Admit Date/Time:  07/09/2013  4:36 PM Admission Comments: No comment available   Primary Diagnosis:  <principal problem not specified> Principal Problem: <principal problem not specified>    Patient Active Problem List     Diagnosis  Date Noted   .  Multiple sclerosis exacerbation  07/03/2013   .  Sepsis  06/08/2013   .  Aspiration pneumonia  06/03/2013   .  Depression  07/21/2012   .  Constipation  06/03/2012   .  Cutaneous abscess of buttock  06/03/2012   .  TIA (transient ischemic attack)  05/31/2012   .  Multiple sclerosis, relapsing-remitting  05/29/2012   .  Right leg weakness  05/28/2012   .  Fall  05/28/2012   .  Leukocytosis  05/28/2012   .  Hypokalemia  05/28/2012   .  BENIGN POSITIONAL VERTIGO  01/27/2010   .  CARPAL TUNNEL SYNDROME, LEFT  04/14/2009   .  SHOULDER STRAIN, LEFT  03/03/2009   .  DIABETES MELLITUS, TYPE II  09/30/2006   .  DYSLIPIDEMIA  09/30/2006   .  TOBACCO USER  09/30/2006   .  HYPERTENSION  09/30/2006   .  Multiple sclerosis  12/05/2005     Expected Discharge Date: Expected Discharge Date: 07/24/13  Team Members Present: Physician leading conference: Dr. Claudette LawsAndrew Kirsteins Social Worker Present: Dossie DerBecky Sonnie Pawloski, LCSW Nurse Present: Ronny BaconWhitney Reardon, RN PT Present: Wanda Plumparoline Cook, PT;Blair Hobble, PT OT Present: Bretta BangKris Gellert, OT SLP Present: Other (comment) Joni Reining(Nicole Page-SP) PPS Coordinator present : Edson SnowballBecky Windsor, Chapman FitchPT;Marie Noel, RN, CRRN        Current  Status/Progress  Goal  Weekly Team Focus   Medical     fearful of movement  improve fear of falling  Family training   Bowel/Bladder     Patient is continent of bowel and bladder. LBM: 07/21/2013  Patient to remain continent of B&B  Monitor for constipation/diarrhea   Swallow/Nutrition/ Hydration     wfl       ADL's     mod assist and increased time  min / mod  assist  improve LE strength and endurance, decrease fear with weight shift forwards   Mobility     Min-Mod A at w/c level  Min A at w/c level  postural stability, activity tolerance, hands-on family training, D/C planning, obtain DME needed for safe D/C home   Communication     min assist-supervision   supervision   use of dysarthria strategies during conversations    Safety/Cognition/ Behavioral Observations    mod-max assist   min assist   complete education prior to discharge.    Pain     Patient denied pain  Pain managed at 2 or less  Assess and monitor pain and provide appropriate intervention   Skin     No skin issues  Free of skin breakdown  Assess skin q shift     *See  Care Plan and progress notes for long and short-term goals.    Barriers to Discharge:  needs to establish comfort level with caregiver      Possible Resolutions to Barriers:    consistent caregiver      Discharge Planning/Teaching Needs:    Home with son and son's girlfriend who can provide 24 hr care and was PTA.  SOn will need to come in for family education      Team Discussion:    Pt is making progress with consistent therapists-building repore and trust with them.  Son coming in for family education tomorrow, will work on safe way to get up stairs with pt. Severe cognition impairments-need 24 hr care   Revisions to Treatment Plan:    None    Continued Need for Acute Rehabilitation Level of Care: The patient requires daily medical management by a physician with specialized training in physical medicine and rehabilitation for the following  conditions: Daily direction of a multidisciplinary physical rehabilitation program to ensure safe treatment while eliciting the highest outcome that is of practical value to the patient.: Yes Daily medical management of patient stability for increased activity during participation in an intensive rehabilitation regime.: Yes Daily analysis of laboratory values and/or radiology reports with any subsequent need for medication adjustment of medical intervention for : Neurological problems  Granite Godman, Lemar Livings 07/22/2013, 1:49 PM         Lucy Chris, LCSW Social Worker Signed  Patient Care Conference Service date: 07/15/2013 12:56 PM  Inpatient RehabilitationTeam Conference and Plan of Care Update Date: 07/15/2013   Time: 10;45 AM     Patient Name: [name] [name]       Medical Record Number: [id number]   Date of Birth: 11/28/1970 Sex: Female         Room/Bed: 4M08C/4M08C-01 Payor Info: Payor: MEDICARE / Plan: MEDICARE PART A AND B / Product Type: *No Product type* /   Admitting Diagnosis: MS exac  yes SLP   Admit Date/Time:  07/09/2013  4:36 PM Admission Comments: No comment available   Primary Diagnosis:  <principal problem not specified> Principal Problem: <principal problem not specified>    Patient Active Problem List     Diagnosis  Date Noted   .  Multiple sclerosis exacerbation  07/03/2013   .  Sepsis  06/08/2013   .  Aspiration pneumonia  06/03/2013   .  Depression  07/21/2012   .  Constipation  06/03/2012   .  Cutaneous abscess of buttock  06/03/2012   .  TIA (transient ischemic attack)  05/31/2012   .  Multiple sclerosis, relapsing-remitting  05/29/2012   .  Right leg weakness  05/28/2012   .  Fall  05/28/2012   .  Leukocytosis  05/28/2012   .  Hypokalemia  05/28/2012   .  BENIGN POSITIONAL VERTIGO  01/27/2010   .  CARPAL TUNNEL SYNDROME, LEFT  04/14/2009   .  SHOULDER STRAIN, LEFT  03/03/2009   .  DIABETES MELLITUS, TYPE II  09/30/2006   .  DYSLIPIDEMIA  09/30/2006    .  TOBACCO USER  09/30/2006   .  HYPERTENSION  09/30/2006   .  Multiple sclerosis  12/05/2005     Expected Discharge Date: Expected Discharge Date: 07/24/13  Team Members Present: Physician leading conference: Dr. Claudette Laws Social Worker Present: Dossie Der, LCSW Nurse Present: Carlean Purl, RN PT Present: Wanda Plump, PT;Blair Hobble, PT OT Present: Bretta Bang, OT SLP Present: Other (comment) Joni Reining Page-SP)  PPS Coordinator present : Tora Duck, RN, CRRN        Current Status/Progress  Goal  Weekly Team Focus   Medical     cognitive def, fearful of movement  improve fear of falling  establish fxnl baseline   Bowel/Bladder     cont of bowel and bladder  cont of bowel and bladder  remain cont of bowel and bladder   Swallow/Nutrition/ Hydration     regular/thin liquids       ADL's     total assist  min / mod  assist  improve trunk control, initiation, sustained attention, and decrease fear with transitional movements   Mobility     Max-Total A  Goals downgraded to Min-Mod A at w/c level  Participation in therapy, intellectual awareness, sustained attention, postural stability, initiate family training,  pursue bilat AFO consult and w/c evaluation   Communication     min-mod assist   supervision   carryover of compensatory strategies for dysarthria   Safety/Cognition/ Behavioral Observations    Max assist   min assist   skilled family education, compensatory strategies.    Pain     no pain  les than 2  assess for pain q4h   Skin     N/a  free of skin breakdown  assess skin q shift     *See Care Plan and progress notes for long and short-term goals.    Barriers to Discharge:  need to establish baseline      Possible Resolutions to Barriers:    therapists to meet family      Discharge Planning/Teaching Needs:    Home with son and son's girlfriend who can provide care-was prior to admission.  Will have son come and attend therapies with pt       Team Discussion:    Focus on family education-pt has severe deficits with movement, little control over body,severe cognitive deficits.  Consult for AFO's and will need w/c eval   Revisions to Treatment Plan:    Downgraded goals to mod level-wheelchair level    Continued Need for Acute Rehabilitation Level of Care: The patient requires daily medical management by a physician with specialized training in physical medicine and rehabilitation for the following conditions: Daily direction of a multidisciplinary physical rehabilitation program to ensure safe treatment while eliciting the highest outcome that is of practical value to the patient.: Yes Daily medical management of patient stability for increased activity during participation in an intensive rehabilitation regime.: Yes Daily analysis of laboratory values and/or radiology reports with any subsequent need for medication adjustment of medical intervention for : Neurological problems  Lucy Chris 07/15/2013, 12:56 PM          Patient ID: [name] Lea, female   DOB: 08-20-1970, 43 y.o.   MRN: [id number]

## 2013-07-22 NOTE — Patient Care Conference (Signed)
Inpatient RehabilitationTeam Conference and Plan of Care Update Date: 07/22/2013   Time: 11;00 Am    Patient Name: [name] [name]      Medical Record Number: [id number]  Date of Birth: 12-26-1970 Sex: Female         Room/Bed: 4M08C/4M08C-01 Payor Info: Payor: MEDICARE / Plan: MEDICARE PART A AND B / Product Type: *No Product type* /    Admitting Diagnosis: MS exac  yes SLP  Admit Date/Time:  07/09/2013  4:36 PM Admission Comments: No comment available   Primary Diagnosis:  <principal problem not specified> Principal Problem: <principal problem not specified>  Patient Active Problem List   Diagnosis Date Noted  . Multiple sclerosis exacerbation 07/03/2013  . Sepsis 06/08/2013  . Aspiration pneumonia 06/03/2013  . Depression 07/21/2012  . Constipation 06/03/2012  . Cutaneous abscess of buttock 06/03/2012  . TIA (transient ischemic attack) 05/31/2012  . Multiple sclerosis, relapsing-remitting 05/29/2012  . Right leg weakness 05/28/2012  . Fall 05/28/2012  . Leukocytosis 05/28/2012  . Hypokalemia 05/28/2012  . BENIGN POSITIONAL VERTIGO 01/27/2010  . CARPAL TUNNEL SYNDROME, LEFT 04/14/2009  . SHOULDER STRAIN, LEFT 03/03/2009  . DIABETES MELLITUS, TYPE II 09/30/2006  . DYSLIPIDEMIA 09/30/2006  . TOBACCO USER 09/30/2006  . HYPERTENSION 09/30/2006  . Multiple sclerosis 12/05/2005    Expected Discharge Date: Expected Discharge Date: 07/24/13  Team Members Present: Physician leading conference: Dr. Claudette LawsAndrew Kirsteins Social Worker Present: Dossie DerBecky Author Hatlestad, LCSW Nurse Present: Ronny BaconWhitney Reardon, RN PT Present: Wanda Plumparoline Cook, PT;Blair Hobble, PT OT Present: Bretta BangKris Gellert, OT SLP Present: Other (comment) Joni Reining(Nicole Page-SP) PPS Coordinator present : Edson SnowballBecky Windsor, Chapman FitchPT;Marie Noel, RN, CRRN     Current Status/Progress Goal Weekly Team Focus  Medical   fearful of movement  improve fear of falling  Family training   Bowel/Bladder   Patient is continent of bowel and bladder. LBM:  07/21/2013  Patient to remain continent of B&B  Monitor for constipation/diarrhea   Swallow/Nutrition/ Hydration     wfl        ADL's   mod assist and increased time  min / mod  assist  improve LE strength and endurance, decrease fear with weight shift forwards   Mobility   Min-Mod A at w/c level  Min A at w/c level  postural stability, activity tolerance, hands-on family training, D/C planning, obtain DME needed for safe D/C home   Communication   min assist-supervision   supervision   use of dysarthria strategies during conversations    Safety/Cognition/ Behavioral Observations  mod-max assist   min assist   complete education prior to discharge.    Pain   Patient denied pain  Pain managed at 2 or less  Assess and monitor pain and provide appropriate intervention   Skin   No skin issues  Free of skin breakdown  Assess skin q shift      *See Care Plan and progress notes for long and short-term goals.  Barriers to Discharge: needs to establish comfort level with caregiver    Possible Resolutions to Barriers:  consistent caregiver    Discharge Planning/Teaching Needs:  Home with son and son's girlfriend who can provide 24 hr care and was PTA.  SOn will need to come in for family education      Team Discussion:  Pt is making progress with consistent therapists-building repore and trust with them.  Son coming in for family education tomorrow, will work on safe way to get up stairs with pt. Severe cognition impairments-need 24 hr care  Revisions to Treatment Plan:  None   Continued Need for Acute Rehabilitation Level of Care: The patient requires daily medical management by a physician with specialized training in physical medicine and rehabilitation for the following conditions: Daily direction of a multidisciplinary physical rehabilitation program to ensure safe treatment while eliciting the highest outcome that is of practical value to the patient.: Yes Daily medical  management of patient stability for increased activity during participation in an intensive rehabilitation regime.: Yes Daily analysis of laboratory values and/or radiology reports with any subsequent need for medication adjustment of medical intervention for : Neurological problems  [name] [name], [name] [name] 07/22/2013, 1:49 PM

## 2013-07-22 NOTE — Progress Notes (Signed)
[name] [name] is a 43 y.o. right-handed female with history of hypertension, diabetes mellitus with peripheral neuropathy as well as multiple sclerosis diagnosed 2007. Patient lives with her son and used a Youth worker prior to admission. Presented 07/03/2013 with increasing weakness over the last 3-4 days as well as mild nausea with vomiting. There was report of a witnessed fall by her son striking her face without loss of consciousness. Cranial CT scan with no acute intracranial abnormalities. Severe changes of small vessel disease of the white matter consistent with multiple sclerosis. Placed on intravenous Solu-Medrol x5 days for suspect MS exacerbation completed 07/08/2013. Psychiatry was consulted in relation to depression placed on Wellbutrin  Subjective/Complaints: Pt denies pain, no issues overnite   Full ROS limited by cognitive def  Review of Systems - limited by cognition  Objective: Vital Signs: Blood pressure 107/87, pulse 82, temperature 98.7 F (37.1 C), temperature source Oral, resp. rate 17, height 5\' 1"  (1.549 m), weight 54.341 kg (119 lb 12.8 oz), last menstrual period 08/23/2012, SpO2 99.00%. No results found. Results for orders placed during the hospital encounter of 07/09/13 (from the past 72 hour(s))  GLUCOSE, CAPILLARY     Status: Abnormal   Collection Time    07/19/13  7:25 AM      Result Value Ref Range   Glucose-Capillary 100 (*) 70 - 99 mg/dL  GLUCOSE, CAPILLARY     Status: None   Collection Time    07/19/13 11:46 AM      Result Value Ref Range   Glucose-Capillary 96  70 - 99 mg/dL  GLUCOSE, CAPILLARY     Status: Abnormal   Collection Time    07/19/13  4:41 PM      Result Value Ref Range   Glucose-Capillary 105 (*) 70 - 99 mg/dL  GLUCOSE, CAPILLARY     Status: Abnormal   Collection Time    07/19/13  9:10 PM      Result Value Ref Range   Glucose-Capillary 130 (*) 70 - 99 mg/dL  GLUCOSE, CAPILLARY     Status: Abnormal   Collection Time    07/20/13   7:21 AM      Result Value Ref Range   Glucose-Capillary 102 (*) 70 - 99 mg/dL   Comment 1 Notify RN    GLUCOSE, CAPILLARY     Status: None   Collection Time    07/20/13 11:24 AM      Result Value Ref Range   Glucose-Capillary 88  70 - 99 mg/dL   Comment 1 Notify RN    GLUCOSE, CAPILLARY     Status: None   Collection Time    07/20/13  4:03 PM      Result Value Ref Range   Glucose-Capillary 94  70 - 99 mg/dL  GLUCOSE, CAPILLARY     Status: Abnormal   Collection Time    07/20/13  8:57 PM      Result Value Ref Range   Glucose-Capillary 105 (*) 70 - 99 mg/dL  GLUCOSE, CAPILLARY     Status: None   Collection Time    07/21/13  7:27 AM      Result Value Ref Range   Glucose-Capillary 91  70 - 99 mg/dL   Comment 1 Notify RN    GLUCOSE, CAPILLARY     Status: None   Collection Time    07/21/13 11:41 AM      Result Value Ref Range   Glucose-Capillary 82  70 - 99 mg/dL   Comment  1 Notify RN    GLUCOSE, CAPILLARY     Status: Abnormal   Collection Time    07/21/13  4:35 PM      Result Value Ref Range   Glucose-Capillary 114 (*) 70 - 99 mg/dL   Comment 1 Notify RN       HEENT: normal Cardio: RRR and no murmurs Resp: CTA B/L GI: BS positive and NT,ND Extremity:  No Edema Skin:   Intact Neuro:   more alert today. Delayed processing, Abnormal Sensory decreaswed sensation in both hands and feet to LT, Abnormal Motor 4- Left delt bi, tri, grip, 4+ on Right sided, 3-/5 LLE, 4/5 RLE, Abnormal FMC Ataxic/ dec FMC and Tone  Hypertonia and Dysarthric Musc/Skel:  Other no pain with AAROM of limbs Gen NAD Psych: flat   Assessment/Plan: 1. Functional deficits secondary to MS exacerbation which require 3+ hours per day of interdisciplinary therapy in a comprehensive inpatient rehab setting. Physiatrist is providing close team supervision and 24 hour management of active medical problems listed below. Physiatrist and rehab team continue to assess barriers to discharge/monitor patient progress  toward functional and medical goals.  FIM: FIM - Bathing Bathing Steps Patient Completed: Chest;Right Arm;Left Arm;Abdomen;Front perineal area;Buttocks;Right upper leg;Left upper leg;Right lower leg (including foot);Left lower leg (including foot) Bathing: 4: Steadying assist  FIM - Upper Body Dressing/Undressing Upper body dressing/undressing steps patient completed: Thread/unthread right sleeve of pullover shirt/dresss;Thread/unthread left sleeve of pullover shirt/dress;Put head through opening of pull over shirt/dress;Pull shirt over trunk Upper body dressing/undressing: 5: Set-up assist to: Obtain clothing/put away FIM - Lower Body Dressing/Undressing Lower body dressing/undressing steps patient completed: Thread/unthread right pants leg Lower body dressing/undressing: 2: Max-Patient completed 25-49% of tasks  FIM - Toileting Toileting steps completed by patient: Performs perineal hygiene Toileting Assistive Devices: Grab bar or rail for support Toileting: 2: Max-Patient completed 1 of 3 steps  FIM - Diplomatic Services operational officerToilet Transfers Toilet Transfers Assistive Devices: Grab bars;Bedside commode Toilet Transfers: 3-From toilet/BSC: Mod A (lift or lower assist);3-To toilet/BSC: Mod A (lift or lower assist)  FIM - Bed/Chair Transfer Bed/Chair Transfer Assistive Devices: Therapist, occupationalWalker Bed/Chair Transfer: 5: Supine > Sit: Supervision (verbal cues/safety issues);5: Bed > Chair or W/C: Supervision (verbal cues/safety issues);4: Chair or W/C > Bed: Min A (steadying Pt. > 75%)  FIM - Locomotion: Wheelchair Distance: 75 Locomotion: Wheelchair: 2: Travels 50 - 149 ft with minimal assistance (Pt.>75%) FIM - Locomotion: Ambulation Locomotion: Ambulation Assistive Devices: Other (comment);Walker - Rolling (ACE bandage at bilat ankles for dorsiflexion assist) Ambulation/Gait Assistance: Not tested (comment) Locomotion: Ambulation: 0: Activity did not occur  Comprehension Comprehension Mode: Auditory Comprehension:  4-Understands basic 75 - 89% of the time/requires cueing 10 - 24% of the time  Expression Expression Mode: Verbal Expression: 4-Expresses basic 75 - 89% of the time/requires cueing 10 - 24% of the time. Needs helper to occlude trach/needs to repeat words.  Social Interaction Social Interaction: 4-Interacts appropriately 75 - 89% of the time - Needs redirection for appropriate language or to initiate interaction.  Problem Solving Problem Solving: 2-Solves basic 25 - 49% of the time - needs direction more than half the time to initiate, plan or complete simple activities  Memory Memory: 2-Recognizes or recalls 25 - 49% of the time/requires cueing 51 - 75% of the time  Medical Problem List and Plan:  1. Functional deficits secondary to MS exacerbation.Solumedrol 5 day course completed  2. DVT Prophylaxis/Anticoagulation: Subcutaneous Lovenox.Monitor platelet count any signs of bleeding  3. Pain Management: tylenol as needed  4. Mood: Wellbutrin 150 mg daily. Followup per psychiatry services.provide emotional support  5. Neuropsych: This patient is not capable of making decisions on her own behalf.  6.Hypertension.Norvasc 5 mg daily,Lopressor 25 mg twice daily. bp's controlled 7.Diabetes Mellitus with peripheral neuropathy. Latest hemoglobin A1c 5.2.   -Patient on Glucophage 500 mg daily prior to admission.  -no meds at present  -reasonable control  LOS (Days) 13 A FACE TO FACE EVALUATION WAS PERFORMED  Aryan Sparks E 07/22/2013, 7:01 AM

## 2013-07-22 NOTE — Progress Notes (Signed)
Occupational Therapy Note  Patient Details  Name: [name] [name] MRN: [id number] Date of Birth: Mar 17, 1970 Today's Date: 07/22/2013  Time In:  0920  Time Out:  0950.  Individual session, no c/o pain.  Treatment session focused on bed mobility, bed to wheelchair transfers, toilet transfers, toilet hygiene, sustained attention, motor planning, following one step directions, sequencing, basic problem solving.  Patient with significant difficulty initiating tasks both cognitively and motorically and requires increased time to perform all activities.  Patient left in wheelchair, safety belt on and call light and phone in reach.   Norton Pastelulaski, Kersti Scavone Halliday 07/22/2013, 9:55 AM

## 2013-07-23 ENCOUNTER — Inpatient Hospital Stay (HOSPITAL_COMMUNITY): Payer: Self-pay | Admitting: Occupational Therapy

## 2013-07-23 ENCOUNTER — Encounter (HOSPITAL_COMMUNITY): Payer: Self-pay | Admitting: Speech Pathology

## 2013-07-23 ENCOUNTER — Inpatient Hospital Stay (HOSPITAL_COMMUNITY): Payer: Self-pay | Admitting: Physical Therapy

## 2013-07-23 ENCOUNTER — Encounter (HOSPITAL_COMMUNITY): Payer: Self-pay

## 2013-07-23 DIAGNOSIS — G35 Multiple sclerosis: Secondary | ICD-10-CM

## 2013-07-23 DIAGNOSIS — G8250 Quadriplegia, unspecified: Secondary | ICD-10-CM

## 2013-07-23 LAB — URINALYSIS, ROUTINE W REFLEX MICROSCOPIC
Bilirubin Urine: NEGATIVE
GLUCOSE, UA: NEGATIVE mg/dL
Ketones, ur: NEGATIVE mg/dL
LEUKOCYTES UA: NEGATIVE
NITRITE: NEGATIVE
PH: 6.5 (ref 5.0–8.0)
Protein, ur: NEGATIVE mg/dL
Specific Gravity, Urine: 1.011 (ref 1.005–1.030)
Urobilinogen, UA: 0.2 mg/dL (ref 0.0–1.0)

## 2013-07-23 LAB — URINE MICROSCOPIC-ADD ON

## 2013-07-23 LAB — GLUCOSE, CAPILLARY
Glucose-Capillary: 108 mg/dL — ABNORMAL HIGH (ref 70–99)
Glucose-Capillary: 87 mg/dL (ref 70–99)
Glucose-Capillary: 103 mg/dL — ABNORMAL HIGH (ref 70–99)
Glucose-Capillary: 136 mg/dL — ABNORMAL HIGH (ref 70–99)

## 2013-07-23 LAB — CREATININE, SERUM
Creatinine, Ser: 0.55 mg/dL (ref 0.50–1.10)
GFR calc non Af Amer: >90 mL/min (ref >90)

## 2013-07-23 NOTE — Progress Notes (Addendum)
Physical Therapy Session Note  Patient Details  Name: [name] [name] MRN: [id number] Date of Birth: 07-Apr-1970  Today's Date: 07/23/2013 Time: 0930-1010 Time Calculation (min): 40 min  Short Term Goals: Week 2:  PT Short Term Goal 1 (Week 2): STG's=LTG's secondary to anticipated LOS  Skilled Therapeutic Interventions/Progress Updates:    Pt received semi-reclined in bed. Required min coaxing to agree to therapy. Pt oriented x1 to self only. After reoriented, pt with onset of tearful episode but easily calmed and redirected to D/C home. Pt reporting needing to urinate but asking to utilize bed pan. With max coaxing, pt agreeable to getting up to use bedside commode. Pt performed supine>sit with mod [name] with HOB elevated using bed rail. Upon sitting EOB, noted pt pushing trunk posteriorly, reporting "spinning" and "vertigo". No nystagmus noted. Pt repositioned self into supine with cervical spine flexion ~15-20 degrees and ~30 degrees R rotation (position on R Roll Test); noted short duration pure horizontal R-beating nystagmus.   With pt consent, maintained cervical spine flexion and rotated cervical spine 30 degrees to L side (position of L Roll Test), which caused more intense pure horizontal L-beating nystagmus accompanied by more intense vertiginous symptoms. Pt reporting feeling sick. Assisted pt in repositioning self into seated, at which time pt vomited. RN called and present soon thereafter to assist. Therapist departed with pt semi-reclined in bed with RN present, 3 bed rails up, bed alarm on, and pt in no apparent distress.  Unable to rule out horizontal canal BPPV in previous sessions, as pt was not amenable to being placed in Roll Test position. However, pt did place self into testing position today. 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  Will attempt to see pt later today to  treat suspected BPPV.  Addendum: 2 hours after treatment session described above, attempted to perform Apiani maneuver to address suspected BPPV; however, unable to position pt in full R side lying secondary to pt fear, perception of falling.  Therapy Documentation Precautions:  Precautions Precautions: Fall Restrictions Weight Bearing Restrictions: No General: Amount of Missed PT Time (min): 5 Minutes Missed Time Reason: Other (comment) (pt vomiting) Pain: Pain Assessment Pain Assessment: No/denies pain Pain Score: 0-No pain  See FIM for current functional status  Therapy/Group: Individual Therapy  [name] [name], [name] [name] 07/23/2013, 10:20 AM

## 2013-07-23 NOTE — Progress Notes (Addendum)
[name] [name] is a 43 y.o. right-handed female with history of hypertension, diabetes mellitus with peripheral neuropathy as well as multiple sclerosis diagnosed 2007. Patient lives with her son and used a Programmer, multimedia prior to admission. Presented 07/03/2013 with increasing weakness over the last 3-4 days as well as mild nausea with vomiting. There was report of a witnessed fall by her son striking her face without loss of consciousness. Cranial CT scan with no acute intracranial abnormalities. Severe changes of small vessel disease of the white matter consistent with multiple sclerosis. Placed on intravenous Solu-Medrol x5 days for suspect MS exacerbation completed 07/08/2013. Psychiatry was consulted in relation to depression placed on Wellbutrin  Subjective/Complaints: Not oriented to time Unaware of D/C in am   Full ROS limited by cognitive def  Review of Systems - limited by cognition  Objective: Vital Signs: Blood pressure 133/79, pulse 78, temperature 99.5 F (37.5 C), temperature source Oral, resp. rate 17, height _0  (1.549 m), weight 54.296 kg (119 lb 11.2 oz), last menstrual period 08/23/2012, SpO2 96.00%. No results found. Results for orders placed during the hospital encounter of 07/09/13 (from the past 72 hour(s))  GLUCOSE, CAPILLARY     Status: None   Collection Time    07/20/13 11:24 AM      Result Value Ref Range   Glucose-Capillary 88  70 - 99 mg/dL   Comment 1 Notify RN    GLUCOSE, CAPILLARY     Status: None   Collection Time    07/20/13  4:03 PM      Result Value Ref Range   Glucose-Capillary 94  70 - 99 mg/dL  GLUCOSE, CAPILLARY     Status: Abnormal   Collection Time    07/20/13  8:57 PM      Result Value Ref Range   Glucose-Capillary 105 (*) 70 - 99 mg/dL  GLUCOSE, CAPILLARY     Status: None   Collection Time    07/21/13  7:27 AM      Result Value Ref Range   Glucose-Capillary 91  70 - 99 mg/dL   Comment 1 Notify RN    GLUCOSE, CAPILLARY     Status:  None   Collection Time    07/21/13 11:41 AM      Result Value Ref Range   Glucose-Capillary 82  70 - 99 mg/dL   Comment 1 Notify RN    GLUCOSE, CAPILLARY     Status: Abnormal   Collection Time    07/21/13  4:35 PM      Result Value Ref Range   Glucose-Capillary 114 (*) 70 - 99 mg/dL   Comment 1 Notify RN    GLUCOSE, CAPILLARY     Status: None   Collection Time    07/22/13  7:17 AM      Result Value Ref Range   Glucose-Capillary 93  70 - 99 mg/dL   Comment 1 Notify RN    GLUCOSE, CAPILLARY     Status: Abnormal   Collection Time    07/22/13 12:06 PM      Result Value Ref Range   Glucose-Capillary 105 (*) 70 - 99 mg/dL   Comment 1 Notify RN    GLUCOSE, CAPILLARY     Status: None   Collection Time    07/22/13  4:00 PM      Result Value Ref Range   Glucose-Capillary 86  70 - 99 mg/dL  GLUCOSE, CAPILLARY     Status: None   Collection Time  07/22/13  8:44 PM      Result Value Ref Range   Glucose-Capillary 81  70 - 99 mg/dL  CREATININE, SERUM     Status: None   Collection Time    07/23/13  4:30 AM      Result Value Ref Range   Creatinine, Ser 0.55  0.50 - 1.10 mg/dL   GFR calc non Af Amer >90  >90 mL/min   GFR calc Af Amer >90  >90 mL/min   Comment: (NOTE)     The eGFR has been calculated using the CKD EPI equation.     This calculation has not been validated in all clinical situations.     eGFR's persistently <90 mL/min signify possible Chronic Kidney     Disease.  GLUCOSE, CAPILLARY     Status: Abnormal   Collection Time    07/23/13  7:10 AM      Result Value Ref Range   Glucose-Capillary 108 (*) 70 - 99 mg/dL     HEENT: normal Cardio: RRR and no murmurs Resp: CTA B/L GI: BS positive and NT,ND Extremity:  No Edema Skin:   Intact Neuro:   more alert today. Delayed processing, Abnormal Sensory decreaswed sensation in both hands and feet to LT, Abnormal Motor 4- Left delt bi, tri, grip, 4+ on Right sided, 3-/5 LLE, 4/5 RLE, Abnormal FMC Ataxic/ dec FMC and Tone   Hypertonia and Dysarthric Musc/Skel:  Other no pain with AAROM of limbs Gen NAD Psych: flat   Assessment/Plan: 1. Functional deficits secondary to MS exacerbation which require 3+ hours per day of interdisciplinary therapy in a comprehensive inpatient rehab setting. Physiatrist is providing close team supervision and 24 hour management of active medical problems listed below. Physiatrist and rehab team continue to assess barriers to discharge/monitor patient progress toward functional and medical goals. D/C in am FIM: FIM - Bathing Bathing Steps Patient Completed: Chest;Right Arm;Left Arm;Abdomen;Front perineal area;Buttocks;Right upper leg;Left upper leg;Right lower leg (including foot);Left lower leg (including foot) Bathing: 4: Min-Patient completes 8-9 50f10 parts or 75+ percent  FIM - Upper Body Dressing/Undressing Upper body dressing/undressing steps patient completed: Thread/unthread right sleeve of pullover shirt/dresss;Thread/unthread left sleeve of pullover shirt/dress;Put head through opening of pull over shirt/dress;Pull shirt over trunk Upper body dressing/undressing: 5: Supervision: Safety issues/verbal cues FIM - Lower Body Dressing/Undressing Lower body dressing/undressing steps patient completed: Thread/unthread right pants leg Lower body dressing/undressing: 1: Total-Patient completed less than 25% of tasks  FIM - Toileting Toileting steps completed by patient: Adjust clothing prior to toileting;Performs perineal hygiene Toileting Assistive Devices: Grab bar or rail for support Toileting: 3: Mod-Patient completed 2 of 3 steps  FIM - TRadio producerDevices: WEnvironmental consultantBedside commode Toilet Transfers: 2-To toilet/BSC: Max A (lift and lower assist);2-From toilet/BSC: Max A (lift and lower assist)  FIM - BControl and instrumentation engineerDevices: WAdult nurseTransfer: 2: Supine > Sit: Max A (lifting assist/Pt. 25-49%);2: Bed  > Chair or W/C: Max A (lift and lower assist);2: Chair or W/C > Bed: Max A (lift and lower assist);2: Sit > Supine: Max A (lifting assist/Pt. 25-49%)  FIM - Locomotion: Wheelchair Distance: 75 Locomotion: Wheelchair: 2: Travels 50 - 149 ft with minimal assistance (Pt.>75%) FIM - Locomotion: Ambulation Locomotion: Ambulation Assistive Devices: Other (comment);Walker - Rolling (ACE bandage at bilat ankles for dorsiflexion assist) Ambulation/Gait Assistance: Not tested (comment) Locomotion: Ambulation: 0: Activity did not occur  Comprehension Comprehension Mode: Auditory Comprehension: 4-Understands basic 75 - 89% of the time/requires cueing 10 -  24% of the time  Expression Expression Mode: Verbal Expression: 4-Expresses basic 75 - 89% of the time/requires cueing 10 - 24% of the time. Needs helper to occlude trach/needs to repeat words.  Social Interaction Social Interaction: 4-Interacts appropriately 75 - 89% of the time - Needs redirection for appropriate language or to initiate interaction.  Problem Solving Problem Solving: 4-Solves basic 75 - 89% of the time/requires cueing 10 - 24% of the time  Memory Memory: 4-Recognizes or recalls 75 - 89% of the time/requires cueing 10 - 24% of the time  Medical Problem List and Plan:  1. Functional deficits secondary to MS exacerbation.Solumedrol 5 day course completed  2. DVT Prophylaxis/Anticoagulation: Subcutaneous Lovenox.Monitor platelet count any signs of bleeding  3. Pain Management: tylenol as needed  4. Mood: Wellbutrin 150 mg daily. Followup per psychiatry services.provide emotional support  5. Neuropsych: This patient is not capable of making decisions on her own behalf.  6.Hypertension.Norvasc 5 mg daily,Lopressor 25 mg twice daily. bp's controlled 7.Diabetes Mellitus with peripheral neuropathy. Latest hemoglobin A1c 5.2.   -Patient on Glucophage 500 mg daily prior to admission.  -no meds at present  -reasonable control  LOS  (Days) 14 A FACE TO FACE EVALUATION WAS PERFORMED  KIRSTEINS,ANDREW E 07/23/2013, 7:29 AM

## 2013-07-23 NOTE — Progress Notes (Signed)
Occupational Therapy Session Note  Patient Details  Name: [name] [name] MRN: [id number] Date of Birth: 01-09-1971  Today's Date: 07/23/2013 Time: 1120-1126 Time Calculation (min): 6 min  Short Term Goals: Week 2:  OT Short Term Goal 1 (Week 2): Patient will bathe self with mod asist OT Short Term Goal 2 (Week 2): Patient will dress upper body with mod assist OT Short Term Goal 3 (Week 2): Patient will dress lower body with mod assist OT Short Term Goal 4 (Week 2): Patient will transfer to bedside commode with mod assist OT Short Term Goal 5 (Week 2): Patient will sit unsupported at edge of bed or on commode with min assist  Skilled Therapeutic Interventions/Progress Updates: ADL-retraining suspended due to patient illness.   Patient fearful of rising from supine to sit d/t recent episode of vertigo w/vomitting.   Patient requested RN tech assist with toileting but rejected OT assist for bed transfer to [facility] and elected to use only bedpan.   Patient expressed concern over return home d/t continued nausea.  OT offered to alert SW to decline in status with possible need for extended stay as patient is scheduled for discharge on 07/24/13.    Therapy Documentation Precautions:  Precautions Precautions: Fall Restrictions Weight Bearing Restrictions: No  General: General Amount of Missed OT Time (min): 39 Minutes Missed Time Reason: Other (comment) (pt remained nauseous from vomiting)  Pain: Pain Assessment Pain Assessment: No/denies pain  See FIM for current functional status  Therapy/Group: Individual Therapy  Rahsaan Weakland 07/23/2013, 12:13 PM

## 2013-07-23 NOTE — Progress Notes (Signed)
Occupational Therapy Session Note  Patient Details  Name: [name] [name] MRN: [id number] Date of Birth: Apr 11, 1970  Today's Date: 07/23/2013 Time: 7616-0737 Time Calculation (min): 40 min  Skilled Therapeutic Interventions/Progress Updates:    Pt performed toilet transfer stand pivot to the bedside commode over the toilet with min assist.  She also needed min assist while standing to manage her clothing.  Ms. Mertins was able to perform her toilet hygiene in sitting with supervision.  Once she finished transferred back to the wheelchair and then back to the EOB to perform Appiani maneuver for repositioning of otoconia crystals.  Pt with no report of dizziness during maneuver but therapist was unable to have pt fully rotate her head to the right for second maneuver in sidelying and pt was resistant to allowing therapist to turn her body to help positing the head down as needed.  Pt returned to sitting position once maneuver was completed and then back to the wheelchair.  No report of dizziness noted with these tasks.  Finished session by performing simple PROM/AAROM exercises to her neck with emphasis on stretches to the scalenes and levator scapulae on the right side as pt tends to keep her head in slight rotation and lateral flexion to the right.  Handouts and demonstration provided to pt's family.    Therapy Documentation Precautions:  Precautions Precautions: Fall Restrictions Weight Bearing Restrictions: No  Pain: Pain Assessment Pain Assessment: Faces (Simultaneous filing. User may not have seen previous data.) Pain Score: 2  Faces Pain Scale: Hurts little more Pain Type: Acute pain Pain Location: Neck Pain Orientation: Posterior Pain Descriptors / Indicators: Aching Pain Frequency: Occasional Pain Onset: Gradual Patients Stated Pain Goal: 3 Pain Intervention(s): Medication (See eMAR) ADL: See FIM for current functional status  Therapy/Group: Individual  Therapy  Ekin Pilar OTR/L 07/23/2013, 4:14 PM

## 2013-07-23 NOTE — Discharge Instructions (Signed)
Inpatient Rehab Discharge Instructions  Pia K Favata Discharge date and time: No discharge date for patient encounter.   Activities/Precautions/ Functional Status: Activity: activity as tolerated Diet: diabetic diet Wound Care: none needed Functional status:  ___ No restrictions     ___ Walk up steps independently ___ 24/7 supervision/assistance   ___ Walk up steps with assistance ___ Intermittent supervision/assistance  ___ Bathe/dress independently ___ Walk with walker     ___ Bathe/dress with assistance ___ Walk Independently    ___ Shower independently _x__ Walk with assistance    ___ Shower with assistance ___ No alcohol     ___ Return to work/school ________  Special Instructions:   COMMUNITY REFERRALS UPON DISCHARGE:    Home Health:   PT, OT, RN, AIDE    Agency:[facility] [phone]:[phone] Date of last service:07/24/2013   Medical Equipment/Items Ordered:WHEELCHAIR  Agency/Supplier:[employer]  [phone] Other:PCS SERVICES-PAP[facility]  GENERAL COMMUNITY RESOURCES FOR PATIENT/FAMILY: Support Groups:MS SUPPORT GROUP- [phone] [facility]  My questions have been answered and I understand these instructions. I will adhere to these goals and the provided educational materials after my discharge from the hospital.  Patient/Caregiver Signature _______________________________ Date __________  Clinician Signature _______________________________________ Date __________  Please bring this form and your medication list with you to all your follow-up doctor's appointments.

## 2013-07-23 NOTE — Progress Notes (Signed)
Called to room by [name] [name], PT due to patient vomiting.  Patient has vertigo and was attempting to stand to transfer when she got extremely dizzy causing her to vomit partially digested food.  Patient assisted back to lying position, says the dizziness has subsided.  Administered meclizine via MD order and gave ginger ale for comfort.  Patient now resting in bed.  Will continue to monitor.  Dani Gobble, RN

## 2013-07-23 NOTE — Progress Notes (Signed)
Speech Language Pathology Daily Session Note  Patient Details  Name: KALIYHA ZAMARRIPA MRN: [id number] Date of Birth: [birth year]  Today's Date: 07/23/2013 Time: 3710-6269 Time Calculation (min): 40 min  Short Term Goals: Week 2: SLP Short Term Goal 1 (Week 2): Pt will improve short term memory for recall of daily events and information for 75% accuracy with mod assist  SLP Short Term Goal 2 (Week 2): Pt will improve executive function for sequencing, organization, and error awareness during basic tasks for 75% accuracy with min-mod assist.  SLP Short Term Goal 3 (Week 2): Pt will selectively attend to functional tasks in a minimally distracting environment for 7-10 minutes with supervision cues for redirection.    SLP Short Term Goal 4 (Week 2): Pt will tolerate her currently prescribed diet with no overt s/s of aspiration with modified independence  SLP Short Term Goal 5 (Week 2): Pt will improve her speech intelligibility to 80% accurate at the conversational level with supervision cues for use of compensatory strategies.    Skilled Therapeutic Interventions:  Pt was seen for skilled speech therapy targeting memory, attention, and executive function.  Pt was initially hesitant to participate in therapy due to episode of vertigo this afternoon which resulted in nausea and vomiting.  However, pt was easily engaged in structured therapeutic tasks while seated upright in bed with min encouragement.  Pt required mod assist verbal and visual cues for working memory during a structured new learning task, min cuing for thought organization and error awareness.  Pt benefited from written aids during task to compensate for decreased recall of game rules.  Pt also required increased encouragement to sit edge of bed for transfer to bedside commode versus using bedpan secondary to anxiety from vertigo.  Pt is ready for d/c tomorrow.    FIM:  Comprehension Comprehension Mode: Auditory Comprehension:  4-Understands basic 75 - 89% of the time/requires cueing 10 - 24% of the time Expression Expression Mode: Verbal Expression: 4-Expresses basic 75 - 89% of the time/requires cueing 10 - 24% of the time. Needs helper to occlude trach/needs to repeat words. Social Interaction Social Interaction: 4-Interacts appropriately 75 - 89% of the time - Needs redirection for appropriate language or to initiate interaction. Problem Solving Problem Solving: 3-Solves basic 50 - 74% of the time/requires cueing 25 - 49% of the time Memory Memory: 3-Recognizes or recalls 50 - 74% of the time/requires cueing 25 - 49% of the time FIM - Eating Eating Activity: 5: Set-up assist for open containers  Pain Pain Assessment Pain Assessment: No/denies pain  Therapy/Group: Individual Therapy  Jackalyn Lombard, M.A. CCC-SLP   Cai Anfinson, Melanee Spry 07/23/2013, 4:15 PM

## 2013-07-23 NOTE — Discharge Summary (Signed)
Discharge summary job [id number]

## 2013-07-23 NOTE — Discharge Summary (Signed)
NAMCandice [name]:  [name], [name]             ACCOUNT NO.:  [id number]  MEDICAL RECORD NO.:  [id number]  LOCATION:  4M08C                        FACILITY:  [facility]  PHYSICIAN:  Erick ColaceAndrew E. Ghislaine Harcum, M.D.DATE OF BIRTH:  04-25-1970  DATE OF ADMISSION:  07/09/2013 DATE OF DISCHARGE:                              DISCHARGE SUMMARY   DISCHARGE DIAGNOSES: 1. Functional deficits secondary to exacerbation of multiple     sclerosis. 2. Subcutaneous Lovenox for deep vein thrombosis prophylaxis. 3. Pain management. 4. Depression. 5. Hypertension. 6. Diabetes mellitus with peripheral neuropathy.  HISTORY OF PRESENT ILLNESS:  This is a 43 year old right-handed female with history of multiple sclerosis diagnosed in 2007, who lives with her son and used a cane and walker prior to admission.  Presented July 03, 2013, with increasing weakness over the last 3-4 days as well as mild nausea with vomiting.  There is report of a witnessed fall by her son striking her face without loss of consciousness.  Cranial CT scan with no acute intracranial abnormalities.  Severe change of a small-vessel disease of the white matter consistent with multiple sclerosis.  Placed on intravenous Solu-Medrol x5 days for MS exacerbation completed July 08, 2013.  Psychiatry Services consulted in relation to depression, placed on Wellbutrin.  Subcutaneous Lovenox for DVT prophylaxis. Physical and occupational therapy ongoing.  The patient was admitted for a comprehensive rehab program.  PAST MEDICAL HISTORY:  See discharge diagnoses.  SOCIAL HISTORY:  Lives with family.  FUNCTIONAL HISTORY:  Prior to admission used a cane, walker prior to admission.  FUNCTIONAL STATUS:  Upon admission to rehab service, she was moderate assist to ambulate a few steps using a rolling walker +2 physical assist sit to stand, min to mod assist activities of daily living.  PHYSICAL EXAMINATION:  VITAL SIGNS:  Blood pressure 145/87, pulse  72, temperature 97, respirations 17. GENERAL:  This was an alert female, no acute distress, flat affect, poor historian.  She was able to provide her name, date and birth place, [facility]. LUNGS:  Clear to auscultation. CARDIAC:  Regular rate and rhythm. ABDOMEN:  Soft, nontender.  Good bowel sounds.  REHABILITATION HOSPITAL COURSE:  The patient was admitted to inpatient rehab services with therapies initiated on a 3-hour daily basis consisting of physical therapy, occupational therapy, and rehabilitation nursing.  The following issues were addressed during the patient's rehabilitation stay.  Pertaining to Ms. [name] [name]'s functional deficits secondary to MS exacerbation, she completed a 5-day course of Solu- Medrol completed July 08, 2013, progressing nicely.  She would follow up with [facility].  Subcutaneous Lovenox for DVT prophylaxis.  No bleeding episode.  She also remained on a low-dose aspirin.  Maintained on Wellbutrin for depression per Psychiatry Services.  Emotional support provided.  She was participative with her therapies.  Blood pressures remained controlled on Norvasc as well as Lopressor.  No orthostatic changes.  She would not follow up with her primary MD.  She did have a history of diabetes mellitus, peripheral neuropathy.  Hemoglobin A1c of 5.2, blood sugars remained good.  She had been on Glucophage 500 mg daily prior to admission and this was resumed at time of discharge..  The patient received  weekly collaborative interdisciplinary team conferences to discuss estimated length of stay, family teaching, and any barriers to her discharge.  Sessions focused on hands on family teaching.  Son with family teaching focused on negotiating stairs, needing mod max assist, performed wheelchair mobility with supervision.  Son gave effective written demonstration of providing min assist with stand pivot transfers using a rolling walker.  Son  declined training with car transfers secondary to the patient fatigue.  The patient transferred to bedside commode min assist.  Full family teaching was completed with her son.  Anticipation was for discharge to home with home health physical and occupational therapy.  DISCHARGE MEDICATIONS: 1. Norvasc 5 mg p.o. daily. 2. Aspirin 81 mg p.o. daily. 3. Wellbutrin 150 mg p.o. daily. 4. Antivert 25 mg p.o. t.i.d. as needed for dizziness. 5. Lopressor 25 mg p.o. b.i.d. 6. Glucophage 500 mg daily DIET:  Diabetic diet.  SPECIAL INSTRUCTIONS:  The patient would follow up with Dr. Claudette Laws at the outpatient rehab service office as needed.  [facility] 2 weeks call for appointment.  Dr. Hyman Hopes medical management.  Home Health physical and occupational therapy had been arranged.     Mariam Dollar, P.A.   ______________________________ Erick Colace, M.D.    DA/MEDQ  D:  07/23/2013  T:  07/23/2013  Job:  [id number]  cc:   Erick Colace, M.D.

## 2013-07-23 NOTE — Progress Notes (Signed)
Social Work Patient ID: Hughes Better, female   DOB: [birth year], 43 y.o.   MRN: [id number] Met with pt,  son and son's girlfriend to have HCPOA notorized and finish up family education.  Pt informed son sick today and not having a good day. Son encouraged her and told her the staff is just trying to help her.  Aware discharge still planned for tomorrow, unless hear otherwise.  He feels prepared to Take pt home.  Follow up arranged via [place] had them before.  Gave son two copies of HCPOA.  Plan on discharge tomorrow.

## 2013-07-24 ENCOUNTER — Inpatient Hospital Stay (HOSPITAL_COMMUNITY): Payer: Self-pay | Admitting: Physical Therapy

## 2013-07-24 ENCOUNTER — Inpatient Hospital Stay (HOSPITAL_COMMUNITY): Payer: Medicare Other

## 2013-07-24 LAB — URINE CULTURE: Colony Count: 10000

## 2013-07-24 LAB — GLUCOSE, CAPILLARY
Glucose-Capillary: 100 mg/dL — ABNORMAL HIGH (ref 70–99)
Glucose-Capillary: 80 mg/dL (ref 70–99)

## 2013-07-24 MED ORDER — MECLIZINE HCL 25 MG PO TABS: 25.0000 mg | ORAL_TABLET | Freq: Three times a day (TID) | ORAL | Status: DC | PRN

## 2013-07-24 MED ORDER — METFORMIN HCL 500 MG PO TABS: 500.0000 mg | ORAL_TABLET | Freq: Every day | ORAL | Status: AC

## 2013-07-24 MED ORDER — AMLODIPINE BESYLATE 5 MG PO TABS: 5.0000 mg | ORAL_TABLET | Freq: Every day | ORAL | Status: AC

## 2013-07-24 MED ORDER — BUPROPION HCL ER (XL) 150 MG PO TB24: 150.0000 mg | ORAL_TABLET | Freq: Every day | ORAL | Status: DC

## 2013-07-24 MED ORDER — ASPIRIN EC 81 MG PO TBEC: 81.0000 mg | DELAYED_RELEASE_TABLET | Freq: Every day | ORAL | Status: AC

## 2013-07-24 MED ORDER — METOPROLOL TARTRATE 25 MG PO TABS: 25.0000 mg | ORAL_TABLET | Freq: Two times a day (BID) | ORAL | Status: AC

## 2013-07-24 NOTE — Progress Notes (Signed)
Occupational Therapy Session Note  Patient Details  Name: [name] MRN: [id number] Date of Birth: 1970-03-05  Today's Date: 07/24/2013 Time: 0930-1030 Time Calculation (min): 60 min  Short Term Goals: Week 2:  OT Short Term Goal 1 (Week 2): Patient will bathe self with mod asist OT Short Term Goal 2 (Week 2): Patient will dress upper body with mod assist OT Short Term Goal 3 (Week 2): Patient will dress lower body with mod assist OT Short Term Goal 4 (Week 2): Patient will transfer to bedside commode with mod assist OT Short Term Goal 5 (Week 2): Patient will sit unsupported at edge of bed or on commode with min assist  Skilled Therapeutic Interventions/Progress Updates: ADL-retraining with focus on improved awareness and attention during grad day ADL.   [facility] with [facility] tech attending, initially.   Patient completing toileting with OT present and required mod assist for clothing management after she performed hygiene.   Patient completed stand pivot transfer to w/c with steadying assist and was escorted to sink for bathing/dressing/grooming session.   Patient was initially passively compliant with directions but improved with re-orientation to task and demonstrated ability to direct caregiver, although she had no clean clothes for session.   Patient performed seated bathing with overall min assist and moderate cues for progression through task due to delayed processing of instructions/prompts.   Patient required extra time and setup assist to dress but did incorporate LUE during task at diminished level  with assist required to pull up her brief and pants as she stood, using UE to support herself at sink countertop.   Patient required assist to don shoes and was left in w/c with call light within reach at end of session.    Therapy Documentation Precautions:  Precautions Precautions: Fall Precaution Comments: Vertigo Restrictions Weight Bearing Restrictions:  No   Pain: Pain Assessment Pain Assessment: No/denies pain Pain Score: 0-No pain  See FIM for current functional status  Therapy/Group: Individual Therapy  Zorian Gunderman 07/24/2013, 12:33 PM

## 2013-07-24 NOTE — Progress Notes (Addendum)
Physical Therapy Discharge Summary  Patient Details  Name: [name] [name] MRN: [id number] Date of Birth: 1970-09-05  Today's Date: 07/24/2013 Time:  1100-1200 Time Calculation (min): 60 min  Patient has met 9 of 9 long term goals due to improved activity tolerance, improved balance, improved postural control, increased strength, ability to compensate for deficits, improved attention and improved awareness.  Patient to discharge at a wheelchair level [place].   Patient's care partner is independent to provide the necessary physical and cognitive assistance at discharge. PT reiterated recommendation for alternative D/C destination secondary to 13 stairs to enter current home. Son verbalized understanding of recommendation but unable to move to home with level entrance. Therefore, therapist educated son, fiance on 2-person arm carry technique.   Reasons goals not met: N/A; all goals met or surpassed.  Recommendation:  Patient will benefit from ongoing skilled PT services in home health setting to continue to advance safe functional mobility, address ongoing impairments in stability/independence with functional mobility, and minimize fall risk.  Equipment: Tilt-in-Space w/c and cushion  Reasons for discharge: treatment goals met and discharge from hospital  Patient/family agrees with progress made and goals achieved: Yes  Skilled Therapeutic Interventions/Progress Updates: Pt received seated in w/c; agreeable to therapy. Session focused on assessing/addressing stability/independence with functional mobility. See D/C evaluation below for detailed description of assist/cueing required with all mobility. Session ended in pt room, where pt was left seated in Tilt-in-Space w/c with seatbelt on and all needs within reach.  Per OT note, Appiani maneuver reattempted yesterday PM in attempt to address suspected L horizontal canalithiasis (posterior limb); however, OT unable to correctly position  pt for maneuver secondary to pt fear/resistance. Pt refusing maneuver during this session. Pt does appear safe with functional mobility and is asymptomatic with bed mobility. RN educated son on BPPV and conveyed this PT's recommendation that pt be seen in outpatient setting by therapist specializing in vestibular rehabilitation if vertiginous symptoms persist.   PT Discharge Precautions/Restrictions Precautions Precautions: Fall Precaution Comments: Vertigo Restrictions Weight Bearing Restrictions: No Vital Signs Therapy Vitals Temp: 98.8 F (37.1 C) Temp src: Oral Pulse Rate: 78 Resp: 18 BP: 130/78 mmHg Patient Position (if appropriate): Sitting Oxygen Therapy SpO2: 99 % O2 Device: None (Room air) Pain Pain Assessment Pain Assessment: No/denies pain Pain Score: 0-No pain Cognition Overall Cognitive Status: Impaired/Different from baseline Arousal/Alertness: Awake/alert Orientation Level: Oriented to person;Oriented to place;Oriented to situation;Disoriented to time Attention: Sustained;Selective;Alternating Sustained Attention: Appears intact Selective Attention: Impaired Selective Attention Impairment: Functional basic;Verbal basic Alternating Attention: Impaired Alternating Attention Impairment: Functional basic;Verbal basic Memory: Impaired Memory Impairment: Storage deficit;Retrieval deficit;Decreased short term memory;Decreased recall of new information Decreased Short Term Memory: Verbal basic Awareness: Impaired Awareness Impairment: Emergent impairment Problem Solving: Impaired Problem Solving Impairment: Functional basic Executive Function: Sequencing;Initiating;Organizing Sequencing: Impaired Sequencing Impairment: Functional basic Organizing: Impaired Organizing Impairment: Functional basic Initiating: Impaired Initiating Impairment: Functional basic Behaviors: Perseveration;Lability Safety/Judgment: Impaired Sensation Sensation Light Touch: Appears  Intact Proprioception: Impaired Detail Proprioception Impaired Details: Impaired LLE Coordination Gross Motor Movements are Fluid and Coordinated: No Fine Motor Movements are Fluid and Coordinated: No Heel Shin Test: Pt now able to attempt bilaterally; excursion and coordination of movement on RLE>LLE Motor  Motor Motor: Abnormal tone;Motor impersistence;Abnormal postural alignment and control  Mobility Bed Mobility Bed Mobility: Supine to Sit;Sit to Supine Supine to Sit: 5: Supervision;HOB flat Supine to Sit Details (indicate cue type and reason): Supervision without rails on D/C eval but intermittently requires min-mod A secondary to fatigue, vertiginous  symptom. Sitting - Scoot to Edge of Bed: 5: Supervision Sit to Supine: 5: Supervision Sit to Supine - Details (indicate cue type and reason): Supervision without rails on D/C eval but intermittently requires min-mod A secondary to fatigue, vertiginous symptom. Transfers Transfers: Yes Stand Pivot Transfers: 5: Supervision;4: Min guard;4: Min Psychologist, occupational Details: Verbal cues for precautions/safety Stand Pivot Transfer Details (indicate cue type and reason): using rolling walker Locomotion  Ambulation Ambulation: No (Pt non-ambulatory) Gait Gait: No (Pt non-ambulatory) Stairs / Additional Locomotion Stairs: No (Pt non-ambulatory) Wheelchair Mobility Wheelchair Mobility: Yes Wheelchair Assistance: 4: Min assist;4: Min guard;5: Investment banker, operational Details: Verbal cues for precautions/safety;Other (comment) (Verbal cueing for obstacle negotiation) Wheelchair Propulsion: Both upper extremities Wheelchair Parts Management: Needs assistance;Supervision/cueing;Other (comment) (Requires supervision/cueing for management of brakes; requires assist for management of all other w/c parts) Distance: 75 (Pt currently utilizing Tilt-In-Space w/c; however, able to perform self-propulsion of w/c x75')  Trunk/Postural  Assessment  Cervical Assessment Cervical Assessment: Exceptions to [facility] Cervical AROM Overall Cervical AROM: Deficits Overall Cervical AROM Comments: Limited lower cervical spine extension; decreased L lateral flexion, L rotation secondary to limited muscle extensibility. Thoracic Assessment Thoracic Assessment: Exceptions to [facility] Thoracic AROM Overall Thoracic AROM: Deficits Overall Thoracic AROM Comments: Thoracic kyphosis in seated; limited active thoracic spine extension Lumbar Assessment Lumbar Assessment: Exceptions to [facility] Lumbar AROM Overall Lumbar AROM: Deficits Lumbar Strength Overall Lumbar Strength: Deficits Postural Control Postural Control: Deficits on evaluation Trunk Control: Pt demonstrates posterior preference in seated but able to maintain stability without hands-on assist. Postural Limitations:  R lateral flexion of cervical spine at rest. Posterior pelvic tilt in seated. In standing, pt with limited bilat hip/knee extension (increasingly more pronounced with increased fatigue).  Balance Static Sitting Balance Static Sitting - Balance Support: Bilateral upper extremity supported;Feet supported Static Sitting - Level of Assistance: 5: Stand by assistance Static Sitting - Comment/# of Minutes: Static sitting EOB x3 minutes with bilat UE support; no LOB. Dynamic Sitting Balance Dynamic Sitting - Balance Support: Feet supported;Bilateral upper extremity supported Dynamic Sitting - Level of Assistance: 4: Min assist;5: Stand by assistance Dynamic Sitting - Balance Activities: Forward lean/weight shifting Sitting balance - Comments: Posterior scooting with bilat UE support with close supervision to min guard. Static Standing Balance Static Standing - Balance Support: During functional activity;Bilateral upper extremity supported Static Standing - Level of Assistance: 5: Stand by assistance Static Standing - Comment/# of Minutes: Static standing with bilat UE support at  rolling walker as therapist pulled up pants; no LOB. Extremity Assessment  RLE Strength RLE Overall Strength: Deficits Right Hip Flexion: 4/5 Right Knee Flexion: 3+/5 Right Knee Extension: 4/5 Right Ankle Dorsiflexion: 3/5 Right Ankle Plantar Flexion: 4/5 Right Ankle Inversion: 3-/5 Right Ankle Eversion: 0/5 RLE Tone RLE Tone: Within Functional Limits LLE Assessment LLE Assessment: Exceptions to [facility] LLE Strength LLE Overall Strength: Deficits Left Hip Flexion: 4/5 Left Knee Flexion: 3+/5 Left Knee Extension: 4/5 Left Ankle Dorsiflexion: 3-/5 Left Ankle Plantar Flexion: 2-/5 Left Ankle Inversion: 2/5 Left Ankle Eversion: 0/5 LLE Tone LLE Tone: Mild LLE Tone Comments: Clonus (3 beats) in L ankle plantarflexors. Unable to detect tone in L quadriceps.  See FIM for current functional status  [place], Malva Cogan 07/24/2013, 5:22 PM

## 2013-07-24 NOTE — Progress Notes (Signed)
Social Work Discharge Note Discharge Note  The overall goal for the admission was met for:   Discharge location: [place]  Length of Stay: Yes-15 DAYS  Discharge activity level: Yes-MIN LEVEL  Home/community participation: Yes  Services provided included: MD, RD, PT, OT, SLP, RN, CM, TR, Pharmacy and SW  Financial Services: Medicare and Medicaid  Follow-up services arranged: Home Health: [place], DME: [facility] and Patient/Family request agency HH: PREF HAD PRIOR TO ADMISSION, DME: NO PREF  Comments (or additional information):SON AND FINANCE IN FOR EDUCATION AND FEEL COMFORTABLE WITH PT'S CARE.  HAVE BEEN PROVIDING CARE PRIOR TO ADMISSION SO KNOW HOW TO MOVE AND HANDLE PT.  PT HAPPY TO BE GOING HOME.  PAPERWORK SENT IN FOR [facility]  Patient/Family verbalized understanding of follow-up arrangements: Yes  Individual responsible for coordination of the follow-up plan: QUENTIN-SON  Confirmed correct DME delivered: Elease Hashimoto 07/24/2013    Elease Hashimoto

## 2013-07-24 NOTE — Plan of Care (Signed)
Problem: RH Other (Specify) Goal: RH LTG Other (Specify) Outcome: Completed/Met Date Met:  07/24/13 PT reiterated recommendation for alternative D/C destination secondary to 13 stairs to enter current home. Son verbalized understanding of recommendation but unable to move to home with unlevel entrance. Therefore, therapist educated pt/fiance on 2-person arm carry with effective return demonstration.     

## 2013-07-24 NOTE — Progress Notes (Signed)
[name] [name] is a 43 y.o. right-handed female with history of hypertension, diabetes mellitus with peripheral neuropathy as well as multiple sclerosis diagnosed 2007. Patient lives with her son and used a Programmer, multimedia prior to admission. Presented 07/03/2013 with increasing weakness over the last 3-4 days as well as mild nausea with vomiting. There was report of a witnessed fall by her son striking her face without loss of consciousness. Cranial CT scan with no acute intracranial abnormalities. Severe changes of small vessel disease of the white matter consistent with multiple sclerosis. Placed on intravenous Solu-Medrol x5 days for suspect MS exacerbation completed 07/08/2013. Psychiatry was consulted in relation to depression placed on Wellbutrin  Subjective/Complaints:    Full ROS limited by cognitive def  Review of Systems - limited by cognition  Objective: Vital Signs: Blood pressure 109/77, pulse 64, temperature 98.4 F (36.9 C), temperature source Oral, resp. rate 19, height _0  (1.549 m), weight 54.296 kg (119 lb 11.2 oz), last menstrual period 08/23/2012, SpO2 98.00%. No results found. Results for orders placed during the hospital encounter of 07/09/13 (from the past 72 hour(s))  GLUCOSE, CAPILLARY     Status: None   Collection Time    07/21/13 11:41 AM      Result Value Ref Range   Glucose-Capillary 82  70 - 99 mg/dL   Comment 1 Notify RN    GLUCOSE, CAPILLARY     Status: Abnormal   Collection Time    07/21/13  4:35 PM      Result Value Ref Range   Glucose-Capillary 114 (*) 70 - 99 mg/dL   Comment 1 Notify RN    GLUCOSE, CAPILLARY     Status: None   Collection Time    07/22/13  7:17 AM      Result Value Ref Range   Glucose-Capillary 93  70 - 99 mg/dL   Comment 1 Notify RN    GLUCOSE, CAPILLARY     Status: Abnormal   Collection Time    07/22/13 12:06 PM      Result Value Ref Range   Glucose-Capillary 105 (*) 70 - 99 mg/dL   Comment 1 Notify RN    GLUCOSE,  CAPILLARY     Status: None   Collection Time    07/22/13  4:00 PM      Result Value Ref Range   Glucose-Capillary 86  70 - 99 mg/dL  GLUCOSE, CAPILLARY     Status: None   Collection Time    07/22/13  8:44 PM      Result Value Ref Range   Glucose-Capillary 81  70 - 99 mg/dL  CREATININE, SERUM     Status: None   Collection Time    07/23/13  4:30 AM      Result Value Ref Range   Creatinine, Ser 0.55  0.50 - 1.10 mg/dL   GFR calc non Af Amer >90  >90 mL/min   GFR calc Af Amer >90  >90 mL/min   Comment: (NOTE)     The eGFR has been calculated using the CKD EPI equation.     This calculation has not been validated in all clinical situations.     eGFR's persistently <90 mL/min signify possible Chronic Kidney     Disease.  GLUCOSE, CAPILLARY     Status: Abnormal   Collection Time    07/23/13  7:10 AM      Result Value Ref Range   Glucose-Capillary 108 (*) 70 - 99 mg/dL  GLUCOSE, CAPILLARY  Status: None   Collection Time    07/23/13 11:17 AM      Result Value Ref Range   Glucose-Capillary 87  70 - 99 mg/dL   Comment 1 Notify RN    URINALYSIS, ROUTINE W REFLEX MICROSCOPIC     Status: Abnormal   Collection Time    07/23/13 11:46 AM      Result Value Ref Range   Color, Urine YELLOW  YELLOW   APPearance CLEAR  CLEAR   Specific Gravity, Urine 1.011  1.005 - 1.030   pH 6.5  5.0 - 8.0   Glucose, UA NEGATIVE  NEGATIVE mg/dL   Hgb urine dipstick MODERATE (*) NEGATIVE   Bilirubin Urine NEGATIVE  NEGATIVE   Ketones, ur NEGATIVE  NEGATIVE mg/dL   Protein, ur NEGATIVE  NEGATIVE mg/dL   Urobilinogen, UA 0.2  0.0 - 1.0 mg/dL   Nitrite NEGATIVE  NEGATIVE   Leukocytes, UA NEGATIVE  NEGATIVE  URINE MICROSCOPIC-ADD ON     Status: None   Collection Time    07/23/13 11:46 AM      Result Value Ref Range   Squamous Epithelial / LPF RARE  RARE   WBC, UA 0-2  <3 WBC/hpf   RBC / HPF 3-6  <3 RBC/hpf   Bacteria, UA RARE  RARE  GLUCOSE, CAPILLARY     Status: Abnormal   Collection Time     07/23/13  4:20 PM      Result Value Ref Range   Glucose-Capillary 103 (*) 70 - 99 mg/dL  GLUCOSE, CAPILLARY     Status: Abnormal   Collection Time    07/23/13  9:18 PM      Result Value Ref Range   Glucose-Capillary 136 (*) 70 - 99 mg/dL  GLUCOSE, CAPILLARY     Status: Abnormal   Collection Time    07/24/13  7:25 AM      Result Value Ref Range   Glucose-Capillary 100 (*) 70 - 99 mg/dL   Comment 1 Notify RN       HEENT: normal Cardio: RRR and no murmurs Resp: CTA B/L GI: BS positive and NT,ND Extremity:  No Edema Skin:   Intact Neuro:   more alert today. Delayed processing, Abnormal Sensory decreaswed sensation in both hands and feet to LT, Abnormal Motor 4- Left delt bi, tri, grip, 4+ on Right sided, 3-/5 LLE, 4/5 RLE, Abnormal FMC Ataxic/ dec FMC and Tone  Hypertonia and Dysarthric Musc/Skel:  Other no pain with AAROM of limbs Gen NAD Psych: flat   Assessment/Plan: 1. Functional deficits secondary to MS exacerbation  Stable for D/C today F/u PCP in 1-2 weeks F/u PM&R 6 weeks F/u neuro 1-2 mo See D/C summary See D/C instructions FIM: FIM - Bathing Bathing Steps Patient Completed: Chest;Right Arm;Left Arm;Abdomen;Front perineal area;Buttocks;Right upper leg;Left upper leg;Right lower leg (including foot);Left lower leg (including foot) Bathing: 4: Min-Patient completes 8-9 [id number] parts or 75+ percent  FIM - Upper Body Dressing/Undressing Upper body dressing/undressing steps patient completed: Thread/unthread right sleeve of pullover shirt/dresss;Thread/unthread left sleeve of pullover shirt/dress;Put head through opening of pull over shirt/dress;Pull shirt over trunk Upper body dressing/undressing: 5: Supervision: Safety issues/verbal cues FIM - Lower Body Dressing/Undressing Lower body dressing/undressing steps patient completed: Thread/unthread right pants leg Lower body dressing/undressing: 1: Total-Patient completed less than 25% of tasks  FIM - Toileting Toileting  steps completed by patient: Adjust clothing prior to toileting;Performs perineal hygiene Toileting Assistive Devices: Grab bar or rail for support Toileting: 3: Mod-Patient completed  2 of 3 steps  FIM - Radio producer Devices: Environmental consultant;Bedside commode Toilet Transfers: 2-To toilet/BSC: Max A (lift and lower assist);2-From toilet/BSC: Max A (lift and lower assist)  FIM - Engineer, site Assistive Devices: Bed rails;HOB elevated Bed/Chair Transfer: 3: Supine > Sit: Mod A (lifting assist/Pt. 50-74%/lift 2 legs;2: Sit > Supine: Max A (lifting assist/Pt. 25-49%)  FIM - Locomotion: Wheelchair Distance: 75 Locomotion: Wheelchair: 0: Activity did not occur FIM - Locomotion: Ambulation Locomotion: Ambulation Assistive Devices: Other (comment);Walker - Rolling (ACE bandage at bilat ankles for dorsiflexion assist) Ambulation/Gait Assistance: Not tested (comment) Locomotion: Ambulation: 0: Activity did not occur  Comprehension Comprehension Mode: Auditory Comprehension: 4-Understands basic 75 - 89% of the time/requires cueing 10 - 24% of the time  Expression Expression Mode: Verbal Expression: 4-Expresses basic 75 - 89% of the time/requires cueing 10 - 24% of the time. Needs helper to occlude trach/needs to repeat words.  Social Interaction Social Interaction: 4-Interacts appropriately 75 - 89% of the time - Needs redirection for appropriate language or to initiate interaction.  Problem Solving Problem Solving: 3-Solves basic 50 - 74% of the time/requires cueing 25 - 49% of the time  Memory Memory: 3-Recognizes or recalls 50 - 74% of the time/requires cueing 25 - 49% of the time  Medical Problem List and Plan:  1. Functional deficits secondary to MS exacerbation.Solumedrol 5 day course completed  2. DVT Prophylaxis/Anticoagulation: Subcutaneous Lovenox.Monitor platelet count any signs of bleeding  3. Pain Management: tylenol as needed  4.  Mood: Wellbutrin 150 mg daily. Followup per psychiatry services.provide emotional support  5. Neuropsych: This patient is not capable of making decisions on her own behalf.  6.Hypertension.Norvasc 5 mg daily,Lopressor 25 mg twice daily. bp's controlled 7.Diabetes Mellitus with peripheral neuropathy. Latest hemoglobin A1c 5.2.   -Patient on Glucophage 500 mg daily prior to admission.  -no meds at present  -reasonable control  LOS (Days) 15 A FACE TO FACE EVALUATION WAS PERFORMED  Ridgely Anastacio E 07/24/2013, 8:14 AM

## 2013-07-24 NOTE — Progress Notes (Signed)
Speech Language Pathology Discharge Summary  Patient Details  Name: [name] [name] MRN: [id number] Date of Birth: [birth year]  Today's Date: 07/24/2013   Patient has met 9 of 9 long term goals.  Patient to discharge at [facility] level.  Reasons goals not met:  n/a   Clinical Impression/Discharge Summary:  Patient has made functional gains and has met 9 of 9 long term goals this admission due to improved speech intelligibility, awareness, problem solving, and attention. Patient is currently an overall min assist for cognitive tasks and self manages her diet with no clinical s/s of aspiration for regular solids and thin liquids.  Patient  and family education is complete and patient will discharge home with 24/7 supervision from family.  Patient would benefit from home health follow up SLP services to maximize cognitive function in order to maximize her functional independence.   Care Partner:  Caregiver Able to Provide Assistance: Yes  Type of Caregiver Assistance: Physical;Cognitive  Recommendation:  Home Health SLP;24 hour supervision/assistance  Rationale for SLP Follow Up: Reduce caregiver burden;Maximize cognitive function and independence   Equipment: none recommended by SLP   Reasons for discharge: Discharged from hospital   Patient/Family Agrees with Progress Made and Goals Achieved: Yes   See FIM for current functional status  Windell Moulding, M.A. CCC-SLP  [name] [name], [name] 07/24/2013, 4:26 PM

## 2013-07-24 NOTE — Progress Notes (Signed)
Patient discharged home with son.  Son verbalized understanding of discharge information, no additional questions asked.  Left floor via wheelchair, escorted by nursing staff.  All patient belongings sent with family.  Patient appears to be in no immediate distress at this time.  Dani Gobble, RN

## 2013-08-05 ENCOUNTER — Inpatient Hospital Stay: Payer: Self-pay

## 2013-08-10 NOTE — Progress Notes (Signed)
Occupational Therapy Discharge Summary  Patient Details  Name: [name] [name] MRN: [id number] Date of Birth: 08/31/1970  Today's Date: 08/10/2013    Patient has met 11 of 13 long term goals due to improved activity tolerance, improved balance, postural control, functional use of  LEFT upper extremity and improved attention.  Patient to discharge at [facility] Assist level.  Patient's care partner is independent to provide the necessary physical and cognitive assistance at discharge.    Reasons goals not met: Patient inconsistently abler to dress lower body at min assist level.    Recommendation:  Patient will benefit from ongoing skilled OT services in home health setting to continue to advance functional skills in the area of BADL.  Equipment: No equipment provided  Reasons for discharge: treatment goals met and discharge from hospital  Patient/family agrees with progress made and goals achieved: Yes  See FIM for current functional status  [name] [name] 08/10/2013, 8:01 AM

## 2013-08-13 ENCOUNTER — Ambulatory Visit (INDEPENDENT_AMBULATORY_CARE_PROVIDER_SITE_OTHER): Payer: Medicare Other | Admitting: Neurology

## 2013-08-13 ENCOUNTER — Encounter: Payer: Self-pay | Admitting: Neurology

## 2013-08-13 VITALS — BP 140/84 | HR 88 | Temp 98.2°F | Wt 117.7 lb

## 2013-08-13 DIAGNOSIS — F329 Major depressive disorder, single episode, unspecified: Secondary | ICD-10-CM

## 2013-08-13 DIAGNOSIS — G35 Multiple sclerosis: Secondary | ICD-10-CM

## 2013-08-13 DIAGNOSIS — G35C0 Secondary progressive multiple sclerosis, unspecified: Secondary | ICD-10-CM

## 2013-08-13 DIAGNOSIS — G459 Transient cerebral ischemic attack, unspecified: Secondary | ICD-10-CM

## 2013-08-13 DIAGNOSIS — F0390 Unspecified dementia without behavioral disturbance: Secondary | ICD-10-CM

## 2013-08-13 DIAGNOSIS — F3289 Other specified depressive episodes: Secondary | ICD-10-CM | POA: Diagnosis not present

## 2013-08-13 DIAGNOSIS — F32A Depression, unspecified: Secondary | ICD-10-CM

## 2013-08-13 MED ORDER — BACLOFEN 10 MG PO TABS: 5.0000 mg | ORAL_TABLET | Freq: Three times a day (TID) | ORAL | Status: DC

## 2013-08-13 MED ORDER — CITALOPRAM HYDROBROMIDE 10 MG PO TABS: 10.0000 mg | ORAL_TABLET | Freq: Every day | ORAL | Status: AC

## 2013-08-13 NOTE — Patient Instructions (Signed)
1.  Stop bupropion.  Instead we will start citalopram 10mg  daily for depression. 2.  For the rigidity, we will start baclofen 5mg  three times daily 3.  We will check a blood test to see if she qualifies for Tysabri, as well as vitamin D level. 4.  Continue physical therapy 5.  Need home health assessment. 6  Follow up in 3 months.

## 2013-08-13 NOTE — Progress Notes (Signed)
NEUROLOGY CONSULTATION NOTE  [name] MRN: [id number] DOB: Oct 28, 1970  Referring provider: Dr. Hyman Hopes Primary care provider: Dr. Hyman Hopes  Reason for consult:  MS  HISTORY OF PRESENT ILLNESS: [name] [name] is a 43 year old right-handed African American woman with history of hypertension, diabetes, and tobacco abuse who presents for history of aggressive multiple sclerosis.  Records and images reviewed. She is accompanied by her son.  She was diagnosed with MS in 2005-05-23.  Reportedly, she had right optic neuritis at that time.  She had an MRI of the brain from 02/04/05, which reportedly revealed "multifocal white matter lesions with enhancement in the right posterior frontal lobe, suggestive of optic neuritis and a demyelinating process at the origin".  She never followed up with a neurologist until 05-24-07, when she saw Dr. Vickey Huger at [facility].  Since she had only one clinical attack, treatment with a disease-modifying agent was withheld at that point.  She subsequently developed several attacks and worsening symptoms over the years.  Symptomatology includes frequent falls, vertigo, right leg weakness, paresthesias, cognitive deficits and mood disorder.  Beginning in 05/24/2010, she required use of a cane.  She didn't follow up again with Dr. Vickey Huger until 2011/05/24.  She has not had any follow ups with a neurologist since then.  Over the years, she has had multiple hospital admissions for symptomatic MS, requiring steroid therapy.  It particularly started getting severe in May 23, 2008, following the death of her brothers.  She became more depressed and wasn't really taking care of herself medically.  Her most recent hospitalization for MS exacerbation was 07/03/13, presenting as nausea, vomiting and dizziness. Prior to this hospitalization, she was able to ambulate short distances with a cane.  She has pretty much been wheelchair-bound.  She requires assistance with all ADLs.  She has bowel and bladder incontinence,  some of it may be behavioral due to depression.  She suffers from significant depression and was only just started on an antidepressant, Wellbutrin, at her last hospitalization.  She was also started on meclizine for vertigo.  Since her last hospitalization, she has visual hallucinations, seeing people in the house that aren't there.  This occurs when she is half asleep at night.  Her son thinks it is related to the Wellbutrin.  Sometimes she is combative and agitated.  She has word-finding difficulties sometimes.  She is somewhat fatigued, but her son thinks it is more likely related to depression.  She lives with her son, who tends to her care.  She does have a physical therapist who comes to the home twice a week.  Home health is currently scheduled to make a visit.  She has never been started on a disease-modifying agent.  Last round of steroids was June 2015.  07/03/13 MRI BRAIN W/WO (compared to scan from 06/04/13):  multiple enhancing white matter lesions within the supratentorial and infratentorial white matter, punctate foci of enhancement within the upper cervical cord at C2 and C3 levels, consistent with active demyelination. 06/04/13 MRI BRAIN W/WO (compared to scan from 09/06/12):  no new areas of demyelination present. 09/06/12 MRI BRAIN W/WO (compared to 05/28/12):  multiple newly seen active foci of demyelination throughout cerebral white matter and cerebellum. 05/28/12 MRI BRAIN WO (compared to 03/18/10):  interval progression of confluent supratentorial and infratentorial white matter lesions 05/28/12 MRI CERVICAL SPINE WO (compared to 03/18/10):  minimal change of signal within cord from C1 through C5 level. 03/18/10 MRI BRAIN W/WO (compared to 06/03/09):  slightly more extensive  white matter hyperintensities  03/18/10 MRI CERVICAL SPINE W/WO (compared to 06/03/09):  signal abnormality in cord from C3-5, stable. 06/03/09 MRI BRAIN W/WO:  widespread white matter disease involving the cerebral hemispheres  and cefrebellum.  07/03/13 LABS:  TSH 0.083 07/10/13 LABS:  WBC 10.6, HGB 14.3, HCT 40.4, MCV 90.8, PLT 269, Na 142, K 3.7, BUN 15, Cr 0.60, TP 6.4, ALP 78, TB 1.2, AST 14, ALT 23 03/16/10 LABS:  Sed Rate 25, CRP 0.5, HIV negative, TSH 0.202, B12 416, ACE 16, RPR non-reactive, ANA negative, B burgdorferi antibodies negative, SSA/SSB antibodies negative  PAST MEDICAL HISTORY: Past Medical History  Diagnosis Date  . Multiple sclerosis   . HYPERTENSION 09/30/2006  . DIABETES MELLITUS, TYPE II 09/30/2006  . TIA (transient ischemic attack)   . TOBACCO USER 09/30/2006    Qualifier: Diagnosis of  By: Delrae AlfredMulberry MD, [name]    . Multiple sclerosis, relapsing-remitting 05/29/2012  . DYSLIPIDEMIA 09/30/2006    Qualifier: Diagnosis of  By: Delrae AlfredMulberry MD, [name]    . DIABETES MELLITUS, TYPE II 09/30/2006    Qualifier: Diagnosis of  By: Delrae AlfredMulberry MD, [name]    . BENIGN POSITIONAL VERTIGO 01/27/2010    Qualifier: Diagnosis of  By: Delrae AlfredMulberry MD, [name]    . Stroke   . Neuromuscular disorder     hx of ms  . Depression     PAST SURGICAL HISTORY: No past surgical history on file.  MEDICATIONS: Current Outpatient Prescriptions on File Prior to Visit  Medication Sig Dispense Refill  . amLODipine (NORVASC) 5 MG tablet Take 1 tablet (5 mg total) by mouth daily.  30 tablet  1  . aspirin EC 81 MG tablet Take 1 tablet (81 mg total) by mouth daily.      . meclizine (ANTIVERT) 25 MG tablet Take 1 tablet (25 mg total) by mouth 3 (three) times daily as needed for dizziness or nausea.  90 tablet  11  . metFORMIN (GLUCOPHAGE) 500 MG tablet Take 1 tablet (500 mg total) by mouth daily with breakfast.  60 tablet  0  . metoprolol tartrate (LOPRESSOR) 25 MG tablet Take 1 tablet (25 mg total) by mouth 2 (two) times daily.  60 tablet  1   No current facility-administered medications on file prior to visit.    ALLERGIES: No Known Allergies  FAMILY HISTORY: Family History  Problem Relation Age of Onset  . Hypertension  Mother   . Kidney failure Mother   . Hypertension Father   . Gout Father   . Hypertension Sister   . Diabetes Sister   . Hypertension Brother     SOCIAL HISTORY: History   Social History  . Marital Status: Single    Spouse Name: N/A    Number of Children: N/A  . Years of Education: N/A   Occupational History  . Not on file.   Social History Main Topics  . Smoking status: Former Smoker -- 0.25 packs/day    Quit date: 07/07/2011  . Smokeless tobacco: Never Used     Comment: quit smoking 2013  some time   . Alcohol Use: No  . Drug Use: No  . Sexual Activity: No   Other Topics Concern  . Not on file   Social History Narrative  . No narrative on file    REVIEW OF SYSTEMS: Constitutional: No fevers, chills, or sweats, no generalized fatigue, change in appetite Eyes: No visual changes, double vision, eye pain Ear, nose and throat: No hearing loss, ear pain, nasal congestion, sore  throat Cardiovascular: No chest pain, palpitations Respiratory:  No shortness of breath at rest or with exertion, wheezes GastrointestinaI: No nausea, vomiting, diarrhea, abdominal pain, fecal incontinence Genitourinary:  No dysuria, urinary retention or frequency Musculoskeletal:  No neck pain, back pain Integumentary: No rash, pruritus, skin lesions Neurological: as above Psychiatric: No depression, insomnia, anxiety Endocrine: No palpitations, fatigue, diaphoresis, mood swings, change in appetite, change in weight, increased thirst Hematologic/Lymphatic:  No anemia, purpura, petechiae. Allergic/Immunologic: no itchy/runny eyes, nasal congestion, recent allergic reactions, rashes  PHYSICAL EXAM: Filed Vitals:   08/13/13 0926  BP: 140/84  Pulse: 88  Temp: 98.2 F (36.8 C)   General: No acute distress, depressed Head:  Normocephalic/atraumatic Neck: stiff, no paraspinal tenderness, reduced range of motion Back: flexed posture.  No paraspinal tenderness Heart: regular rate and  rhythm Lungs: Clear to auscultation bilaterally. Vascular: No carotid bruits. Neurological Exam: Mental status: depressed affect, alert and oriented to person, place, (not time), delayed recall mildly impaired and remote memory intact, fund of knowledge intact, attention and concentration intact, reduced verbal output but speech fluent and not dysarthric, language intact. Cranial nerves: CN I: not tested CN II: pupils equal, round and reactive to light, visual fields intact, fundi unremarkable, without vessel changes, exudates, hemorrhages or papilledema. CN III, IV, VI:  Does not track well but appears to have full range of motion, no nystagmus, no ptosis CN V: facial sensation intact CN VII: upper and lower face symmetric CN VIII: hearing intact CN IX, X: gag intact, uvula midline CN XI: sternocleidomastoid and trapezius muscles intact CN XII: tongue midline Bulk & Tone: increased tone in all extremities, no fasciculations. Motor: 5-/5 in upper extremities, 4+/5 in lower extremities except 4-/5 in left hip flexion. Sensation: Endorses reduced pinprick sensation in right hand and left foot. Deep Tendon Reflexes: 3+ throughout, toes upgoing. Finger to nose testing: slow.  No dysmetria but with past pointing. Heel to shin: unable to assess Gait: in wheelchair.  Cannot stand.  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  Prognosis is poor and all we can hope for is to try and prevent further deterioration.  PLAN: 1.  Would like to start Tysabri.  Discussed side effects with patient and her son.  Provided information on available medications.  Will check JCV antibodies first. 2.  Will initiate baclofen 5mg  three times daily for spasticity 3.  Will stop Wellbutrin and start Celexa 10mg  at bedtime.  May need to establish care  with psychiatry. 4.  Continue PT 5.  Awaiting home health visit. 6.  Check vit D level 7.  Follow up in 3 months.  Thank you for allowing me to take part in the care of this patient.  Shon Millet, DO  CC:  Jeanann Lewandowsky, MD

## 2013-08-14 LAB — VITAMIN D 25 HYDROXY (VIT D DEFICIENCY, FRACTURES): VIT D 25 HYDROXY: 10 ng/mL — AB (ref 30–89)

## 2013-08-16 LAB — STRATIFY JCV ANTIBODY ELISA W/RFLX TO INHIBITION ASSAY

## 2013-08-21 ENCOUNTER — Ambulatory Visit: Payer: Self-pay | Admitting: Neurology

## 2013-08-24 ENCOUNTER — Telehealth: Payer: Self-pay | Admitting: Neurology

## 2013-08-24 NOTE — Telephone Encounter (Signed)
Pt no showed 08/21/13 appt w/ Dr. Everlena Cooper. i did not send letter to pt as our office added this appt on for the pt / Sherri S.

## 2013-09-01 ENCOUNTER — Inpatient Hospital Stay (HOSPITAL_COMMUNITY)
Admission: EM | Admit: 2013-09-01 | Discharge: 2013-09-07 | DRG: 871 | Disposition: A | Discharge status: Skilled Nursing Facility | Payer: Medicare Other | Attending: Internal Medicine | Admitting: Internal Medicine

## 2013-09-01 ENCOUNTER — Encounter (HOSPITAL_COMMUNITY): Payer: Self-pay | Admitting: Emergency Medicine

## 2013-09-01 ENCOUNTER — Emergency Department (HOSPITAL_COMMUNITY): Payer: Medicare Other

## 2013-09-01 DIAGNOSIS — L03319 Cellulitis of trunk, unspecified: Secondary | ICD-10-CM | POA: Diagnosis present

## 2013-09-01 DIAGNOSIS — G9341 Metabolic encephalopathy: Secondary | ICD-10-CM | POA: Diagnosis present

## 2013-09-01 DIAGNOSIS — F0390 Unspecified dementia without behavioral disturbance: Secondary | ICD-10-CM | POA: Diagnosis present

## 2013-09-01 DIAGNOSIS — L8993 Pressure ulcer of unspecified site, stage 3: Secondary | ICD-10-CM | POA: Diagnosis present

## 2013-09-01 DIAGNOSIS — D72829 Elevated white blood cell count, unspecified: Secondary | ICD-10-CM

## 2013-09-01 DIAGNOSIS — I10 Essential (primary) hypertension: Secondary | ICD-10-CM | POA: Diagnosis present

## 2013-09-01 DIAGNOSIS — Z8249 Family history of ischemic heart disease and other diseases of the circulatory system: Secondary | ICD-10-CM | POA: Diagnosis not present

## 2013-09-01 DIAGNOSIS — L02219 Cutaneous abscess of trunk, unspecified: Secondary | ICD-10-CM | POA: Diagnosis present

## 2013-09-01 DIAGNOSIS — Z87891 Personal history of nicotine dependence: Secondary | ICD-10-CM | POA: Diagnosis not present

## 2013-09-01 DIAGNOSIS — G35 Multiple sclerosis: Secondary | ICD-10-CM | POA: Diagnosis present

## 2013-09-01 DIAGNOSIS — R5383 Other fatigue: Secondary | ICD-10-CM | POA: Diagnosis present

## 2013-09-01 DIAGNOSIS — L0231 Cutaneous abscess of buttock: Secondary | ICD-10-CM

## 2013-09-01 DIAGNOSIS — R5381 Other malaise: Secondary | ICD-10-CM | POA: Diagnosis not present

## 2013-09-01 DIAGNOSIS — L8990 Pressure ulcer of unspecified site, unspecified stage: Secondary | ICD-10-CM

## 2013-09-01 DIAGNOSIS — L89109 Pressure ulcer of unspecified part of back, unspecified stage: Secondary | ICD-10-CM

## 2013-09-01 DIAGNOSIS — Z833 Family history of diabetes mellitus: Secondary | ICD-10-CM

## 2013-09-01 DIAGNOSIS — Z7401 Bed confinement status: Secondary | ICD-10-CM | POA: Diagnosis not present

## 2013-09-01 DIAGNOSIS — L0390 Cellulitis, unspecified: Secondary | ICD-10-CM | POA: Diagnosis present

## 2013-09-01 DIAGNOSIS — E119 Type 2 diabetes mellitus without complications: Secondary | ICD-10-CM | POA: Diagnosis present

## 2013-09-01 DIAGNOSIS — F32A Depression, unspecified: Secondary | ICD-10-CM

## 2013-09-01 DIAGNOSIS — R4182 Altered mental status, unspecified: Secondary | ICD-10-CM

## 2013-09-01 DIAGNOSIS — L89159 Pressure ulcer of sacral region, unspecified stage: Secondary | ICD-10-CM

## 2013-09-01 DIAGNOSIS — Z8673 Personal history of transient ischemic attack (TIA), and cerebral infarction without residual deficits: Secondary | ICD-10-CM

## 2013-09-01 DIAGNOSIS — G9340 Encephalopathy, unspecified: Secondary | ICD-10-CM | POA: Diagnosis present

## 2013-09-01 DIAGNOSIS — E785 Hyperlipidemia, unspecified: Secondary | ICD-10-CM | POA: Diagnosis present

## 2013-09-01 DIAGNOSIS — Z993 Dependence on wheelchair: Secondary | ICD-10-CM | POA: Diagnosis not present

## 2013-09-01 DIAGNOSIS — E876 Hypokalemia: Secondary | ICD-10-CM | POA: Diagnosis present

## 2013-09-01 DIAGNOSIS — A419 Sepsis, unspecified organism: Secondary | ICD-10-CM | POA: Diagnosis not present

## 2013-09-01 DIAGNOSIS — Z7982 Long term (current) use of aspirin: Secondary | ICD-10-CM | POA: Diagnosis not present

## 2013-09-01 DIAGNOSIS — F329 Major depressive disorder, single episode, unspecified: Secondary | ICD-10-CM

## 2013-09-01 LAB — URINALYSIS, ROUTINE W REFLEX MICROSCOPIC
Bilirubin Urine: NEGATIVE
Glucose, UA: NEGATIVE mg/dL
HGB URINE DIPSTICK: NEGATIVE
Ketones, ur: NEGATIVE mg/dL
NITRITE: NEGATIVE
PROTEIN: NEGATIVE mg/dL
SPECIFIC GRAVITY, URINE: 1.015 (ref 1.005–1.030)
UROBILINOGEN UA: 1 mg/dL (ref 0.0–1.0)
pH: 6.5 (ref 5.0–8.0)

## 2013-09-01 LAB — CBC WITH DIFFERENTIAL/PLATELET
BASOS ABS: 0 10*3/uL (ref 0.0–0.1)
BASOS PCT: 0 % (ref 0–1)
EOS ABS: 0.1 10*3/uL (ref 0.0–0.7)
EOS PCT: 1 % (ref 0–5)
HEMATOCRIT: 34.8 % — AB (ref 36.0–46.0)
Hemoglobin: 12 g/dL (ref 12.0–15.0)
Lymphocytes Relative: 9 % — ABNORMAL LOW (ref 12–46)
Lymphs Abs: 1.2 10*3/uL (ref 0.7–4.0)
MCH: 31.9 pg (ref 26.0–34.0)
MCHC: 34.5 g/dL (ref 30.0–36.0)
MCV: 92.6 fL (ref 78.0–100.0)
MONO ABS: 0.8 10*3/uL (ref 0.1–1.0)
Monocytes Relative: 6 % (ref 3–12)
Neutro Abs: 10.8 10*3/uL — ABNORMAL HIGH (ref 1.7–7.7)
Neutrophils Relative %: 84 % — ABNORMAL HIGH (ref 43–77)
Platelets: 362 10*3/uL (ref 150–400)
RBC: 3.76 MIL/uL — ABNORMAL LOW (ref 3.87–5.11)
RDW: 13.3 % (ref 11.5–15.5)
WBC: 12.8 10*3/uL — ABNORMAL HIGH (ref 4.0–10.5)

## 2013-09-01 LAB — RAPID URINE DRUG SCREEN, HOSP PERFORMED
AMPHETAMINES: NOT DETECTED
BARBITURATES: NOT DETECTED
BENZODIAZEPINES: NOT DETECTED
COCAINE: NOT DETECTED
OPIATES: NOT DETECTED
TETRAHYDROCANNABINOL: NOT DETECTED

## 2013-09-01 LAB — COMPREHENSIVE METABOLIC PANEL
ALK PHOS: 92 U/L (ref 39–117)
ALT: 24 U/L (ref 0–35)
AST: 102 U/L — AB (ref 0–37)
Albumin: 2.7 g/dL — ABNORMAL LOW (ref 3.5–5.2)
Anion gap: 11 (ref 5–15)
BILIRUBIN TOTAL: 0.8 mg/dL (ref 0.3–1.2)
BUN: 4 mg/dL — AB (ref 6–23)
CHLORIDE: 103 meq/L (ref 96–112)
CO2: 24 meq/L (ref 19–32)
CREATININE: 0.21 mg/dL — AB (ref 0.50–1.10)
Calcium: 8.2 mg/dL — ABNORMAL LOW (ref 8.4–10.5)
GFR calc Af Amer: >90 mL/min (ref >90)
GFR calc non Af Amer: >90 mL/min (ref >90)
Glucose, Bld: 117 mg/dL — ABNORMAL HIGH (ref 70–99)
Potassium: 7.1 mEq/L (ref 3.7–5.3)
Sodium: 138 mEq/L (ref 137–147)
Total Protein: 7.1 g/dL (ref 6.0–8.3)

## 2013-09-01 LAB — I-STAT TROPONIN, ED: Troponin i, poc: 0 ng/mL (ref 0.00–0.08)

## 2013-09-01 LAB — POTASSIUM: Potassium: 2.9 mEq/L — CL (ref 3.7–5.3)

## 2013-09-01 LAB — POC URINE PREG, ED: PREG TEST UR: NEGATIVE

## 2013-09-01 LAB — URINE MICROSCOPIC-ADD ON

## 2013-09-01 LAB — MAGNESIUM: Magnesium: 1.8 mg/dL (ref 1.5–2.5)

## 2013-09-01 LAB — LACTIC ACID, PLASMA: Lactic Acid, Venous: 0.2 mmol/L — ABNORMAL LOW (ref 0.5–2.2)

## 2013-09-01 MED ORDER — METOPROLOL TARTRATE 25 MG PO TABS
25.0000 mg | ORAL_TABLET | Freq: Two times a day (BID) | ORAL | Status: DC
  Administered 2013-09-02 – 2013-09-07 (×12): 25 mg via ORAL
  Filled 2013-09-01 (×12): qty 1

## 2013-09-01 MED ORDER — HEPARIN SODIUM (PORCINE) 5000 UNIT/ML IJ SOLN
5000.0000 [IU] | Freq: Three times a day (TID) | INTRAMUSCULAR | Status: DC
  Administered 2013-09-02 – 2013-09-07 (×17): 5000 [IU] via SUBCUTANEOUS
  Filled 2013-09-01 (×21): qty 1

## 2013-09-01 MED ORDER — IOHEXOL 300 MG/ML  SOLN
25.0000 mL | Freq: Once | INTRAMUSCULAR | Status: AC | PRN
  Administered 2013-09-01: 25 mL via ORAL

## 2013-09-01 MED ORDER — ASPIRIN EC 81 MG PO TBEC
81.0000 mg | DELAYED_RELEASE_TABLET | Freq: Every day | ORAL | Status: DC
  Administered 2013-09-02 – 2013-09-07 (×6): 81 mg via ORAL
  Filled 2013-09-01 (×6): qty 1

## 2013-09-01 MED ORDER — BACLOFEN 5 MG HALF TABLET
5.0000 mg | ORAL_TABLET | Freq: Three times a day (TID) | ORAL | Status: DC
  Administered 2013-09-02 – 2013-09-07 (×16): 5 mg via ORAL
  Filled 2013-09-01 (×19): qty 1

## 2013-09-01 MED ORDER — PIPERACILLIN-TAZOBACTAM 3.375 G IVPB
3.3750 g | Freq: Three times a day (TID) | INTRAVENOUS | Status: DC
  Administered 2013-09-02 – 2013-09-04 (×8): 3.375 g via INTRAVENOUS
  Filled 2013-09-01 (×9): qty 50

## 2013-09-01 MED ORDER — VANCOMYCIN HCL IN DEXTROSE 1-5 GM/200ML-% IV SOLN
1000.0000 mg | Freq: Once | INTRAVENOUS | Status: AC
  Administered 2013-09-01: 1000 mg via INTRAVENOUS
  Filled 2013-09-01: qty 200

## 2013-09-01 MED ORDER — CITALOPRAM HYDROBROMIDE 10 MG PO TABS
10.0000 mg | ORAL_TABLET | Freq: Every day | ORAL | Status: DC
  Administered 2013-09-02 – 2013-09-07 (×6): 10 mg via ORAL
  Filled 2013-09-01 (×6): qty 1

## 2013-09-01 MED ORDER — INSULIN ASPART 100 UNIT/ML ~~LOC~~ SOLN
0.0000 [IU] | SUBCUTANEOUS | Status: DC
  Administered 2013-09-02 – 2013-09-07 (×6): 1 [IU] via SUBCUTANEOUS

## 2013-09-01 MED ORDER — SODIUM CHLORIDE 0.9 % IV SOLN
1000.0000 mL | INTRAVENOUS | Status: DC
  Administered 2013-09-01: 1000 mL via INTRAVENOUS

## 2013-09-01 MED ORDER — SODIUM CHLORIDE 0.9 % IV BOLUS (SEPSIS)
30.0000 mL/kg | Freq: Once | INTRAVENOUS | Status: AC
  Administered 2013-09-01: 1000 mL via INTRAVENOUS

## 2013-09-01 MED ORDER — AMLODIPINE BESYLATE 5 MG PO TABS
5.0000 mg | ORAL_TABLET | Freq: Every day | ORAL | Status: DC
  Administered 2013-09-02 – 2013-09-07 (×6): 5 mg via ORAL
  Filled 2013-09-01 (×6): qty 1

## 2013-09-01 MED ORDER — MECLIZINE HCL 25 MG PO TABS
25.0000 mg | ORAL_TABLET | Freq: Three times a day (TID) | ORAL | Status: DC | PRN
  Filled 2013-09-01: qty 1

## 2013-09-01 MED ORDER — POTASSIUM CHLORIDE IN NACL 20-0.9 MEQ/L-% IV SOLN
INTRAVENOUS | Status: DC
  Administered 2013-09-01 – 2013-09-02 (×2): via INTRAVENOUS
  Administered 2013-09-03: 50 mL/h via INTRAVENOUS
  Administered 2013-09-04: 05:00:00 via INTRAVENOUS
  Filled 2013-09-01 (×5): qty 1000

## 2013-09-01 MED ORDER — VANCOMYCIN HCL IN DEXTROSE 750-5 MG/150ML-% IV SOLN
750.0000 mg | Freq: Two times a day (BID) | INTRAVENOUS | Status: DC
  Administered 2013-09-02 – 2013-09-03 (×4): 750 mg via INTRAVENOUS
  Filled 2013-09-01 (×5): qty 150

## 2013-09-01 MED ORDER — PIPERACILLIN-TAZOBACTAM 3.375 G IVPB 30 MIN
3.3750 g | Freq: Once | INTRAVENOUS | Status: AC
  Administered 2013-09-01: 3.375 g via INTRAVENOUS
  Filled 2013-09-01: qty 50

## 2013-09-01 MED ORDER — IOHEXOL 300 MG/ML  SOLN
100.0000 mL | Freq: Once | INTRAMUSCULAR | Status: AC | PRN
  Administered 2013-09-01: 100 mL via INTRAVENOUS

## 2013-09-01 NOTE — Progress Notes (Addendum)
ED CM noted patient to have had 2 ED visits and 4 hospitalizations in  the past 6 months. Patient presents directly from home via EMS, called by son (primary caregiver) for gradual worsening altered mental status over past 1 week, associated with worsening weakness (in setting of chronic debilitation from MS) and increasing bowel / bladder incontinence. CSW spoke with son Mena GoesQuinton at [phone] . Patient is being cared for at home by her son. He states, that he, his fiance,along with 71 hours of HHA per month. [facility]. Plan for disposition discussed with CSW, son verbalized his care goal is a short term rehab facility. ED evaluation afebrile but tachycardia HR-108, K+2.7, WBC 12.8 Patient is being admitted and started on IV Zosyn and Vanco with urine and bld cltx. Unit CSW/ CM will follow up.

## 2013-09-01 NOTE — ED Provider Notes (Signed)
CSN: [id number]     Arrival date & time 09/01/13  1435 History   First MD Initiated Contact with Patient 09/01/13 1435   Chief Complaint  Patient presents with  . Weakness   HPI  Patient presents directly from home via EMS, called by son (primary caregiver) for gradual worsening altered mental status over past 1 week, associated with worsening weakness (in setting of chronic debilitation from MS) and increasing bowel / bladder incontinence.  Limited history provided by patient, unable to provide accurate history in setting of chronic dementia. Currently patient denies any complaints of pain, CP, SOB, abdominal pain, fever/chills. She admits to being "comfortable" and is "requesting ice cream".  Majority of history provided by patient's son Mena Goes) via phone, and he plans to come in to ED and will be at bedside later today. Patient's son reports that she has demonstrated increased confusion gradually over past 1 week, with significant worsening today, stated she did not recognize him as her son and did not know his name. Also reported that she has been talking in her sleep and staying awake at night. Recently worsened incontinence requiring depends for past 1-2 weeks, no longer able to tell when needs to go to bathroom. Increasingly bedridden with worsening weakness. Reported developing and worsened buttock bedsore (without drainage or bleeding, does not seem to be healing). Associated symptoms per son, increased odor and dark color to urine, denies dysuria, tolerating increased PO hydration but some decreased appetite. Denies fevers, cough, nausea, vomiting, diarrhea, bloody stool, CP, abdominal pain.  Lives at home with son-Quinton (primary caregiver and HCPOA), significant history of aggressive multiple sclerosis, chronic dementia. Baseline functional status (>2-3 weeks ago), patient mostly non-ambulatory and wheelchair bound, able to stand with assistance and make it to toilet.  Past Medical  History  Diagnosis Date  . Multiple sclerosis   . HYPERTENSION 09/30/2006  . DIABETES MELLITUS, TYPE II 09/30/2006  . TIA (transient ischemic attack)   . TOBACCO USER 09/30/2006    Qualifier: Diagnosis of  By: Delrae Alfred MD, Lanora Manis    . Multiple sclerosis, relapsing-remitting 05/29/2012  . DYSLIPIDEMIA 09/30/2006    Qualifier: Diagnosis of  By: Delrae Alfred MD, Lanora Manis    . DIABETES MELLITUS, TYPE II 09/30/2006    Qualifier: Diagnosis of  By: Delrae Alfred MD, Lanora Manis    . BENIGN POSITIONAL VERTIGO 01/27/2010    Qualifier: Diagnosis of  By: Delrae Alfred MD, Lanora Manis    . Stroke   . Neuromuscular disorder     hx of ms  . Depression    History reviewed. No pertinent past surgical history. Family History  Problem Relation Age of Onset  . Hypertension Mother   . Kidney failure Mother   . Hypertension Father   . Gout Father   . Hypertension Sister   . Diabetes Sister   . Hypertension Brother    History  Substance Use Topics  . Smoking status: Former Smoker -- 0.25 packs/day    Quit date: 07/07/2011  . Smokeless tobacco: Never Used     Comment: quit smoking 2013  some time   . Alcohol Use: No   OB History   Grav Para Term Preterm Abortions TAB SAB Ect Mult Living                 Review of Systems  See above HPI  Allergies  Review of patient's allergies indicates no known allergies.  Home Medications   Prior to Admission medications   Medication Sig Start Date End Date Taking? Authorizing  Provider  amLODipine (NORVASC) 5 MG tablet Take 1 tablet (5 mg total) by mouth daily. 07/24/13   Mcarthur Rossettianiel J Angiulli, PA-C  aspirin EC 81 MG tablet Take 1 tablet (81 mg total) by mouth daily. 07/24/13   Mcarthur Rossettianiel J Angiulli, PA-C  baclofen (LIORESAL) 10 MG tablet Take 0.5 tablets (5 mg total) by mouth 3 (three) times daily. 08/13/13   Adam Gus Rankinobert Jaffe, DO  citalopram (CELEXA) 10 MG tablet Take 1 tablet (10 mg total) by mouth daily. 08/13/13   Adam Gus Rankinobert Jaffe, DO  meclizine (ANTIVERT) 25 MG tablet Take 1  tablet (25 mg total) by mouth 3 (three) times daily as needed for dizziness or nausea. 07/24/13   Mcarthur Rossettianiel J Angiulli, PA-C  metFORMIN (GLUCOPHAGE) 500 MG tablet Take 1 tablet (500 mg total) by mouth daily with breakfast. 07/24/13   Mcarthur Rossettianiel J Angiulli, PA-C  metoprolol tartrate (LOPRESSOR) 25 MG tablet Take 1 tablet (25 mg total) by mouth 2 (two) times daily. 07/24/13   Daniel J Angiulli, PA-C   BP 140/87  Pulse 109  Temp(Src) 98.6 F (37 C) (Oral)  Resp 17  SpO2 100%  LMP 08/23/2012 Physical Exam  Gen - chronically ill-appearing, NAD HEENT - NCAT, PERRL, EOMI with poor eye tracking on exam, oropharynx clear, mild dry MM Neck - supple, non-tender Heart - tachycardic, no murmurs heard Lungs - CTAB. Normal work of breathing. Abd - soft, NTND, no masses, +active BS Ext - non-tender, no edema, peripheral pulses intact +2 b/l Skin - sacral decubitus ulcer extending across bilateral buttocks > 6 inches across large area with circumferential breakdown with some open pockets with purulent foul smelling drainage Neuro - awake, alert, oriented (person, place, not year), CN-II-XII intact except poor EOMI tracking with visual field and difficulty with visual acuity (does not have glasses), no nystagmus, increased muscle tone in bilateral upper extremities, positive left UE pronator drift with weakness, motor strength 5/5 bilateral Right UE/LE, and 4/5 bilateral Left UE/LE, +2/4 DTR intact and symmetrical. Unable to sit or stand without assistance.   ED Course  Procedures (including critical care time) Labs Review Labs Reviewed  LACTIC ACID, PLASMA - Abnormal; Notable for the following:    Lactic Acid, Venous 0.2 (*)    All other components within normal limits  COMPREHENSIVE METABOLIC PANEL - Abnormal; Notable for the following:    Potassium 7.1 (*)    Glucose, Bld 117 (*)    BUN 4 (*)    Creatinine, Ser 0.21 (*)    Calcium 8.2 (*)    Albumin 2.7 (*)    AST 102 (*)    All other components within  normal limits  CBC WITH DIFFERENTIAL - Abnormal; Notable for the following:    WBC 12.8 (*)    RBC 3.76 (*)    HCT 34.8 (*)    Neutrophils Relative % 84 (*)    Neutro Abs 10.8 (*)    Lymphocytes Relative 9 (*)    All other components within normal limits  URINE CULTURE  CULTURE, BLOOD (ROUTINE X 2)  CULTURE, BLOOD (ROUTINE X 2)  WOUND CULTURE  URINALYSIS, ROUTINE W REFLEX MICROSCOPIC  URINE RAPID DRUG SCREEN (HOSP PERFORMED)  POTASSIUM  I-STAT TROPOININ, ED  POC URINE PREG, ED    Imaging Review Dg Chest 2 View  09/01/2013   CLINICAL DATA:  Weakness  EXAM: CHEST  2 VIEW  COMPARISON:  07/03/2013  FINDINGS: The heart size and mediastinal contours are within normal limits. Both lungs are clear. The visualized  skeletal structures are unremarkable.  IMPRESSION: No active cardiopulmonary disease.   Electronically Signed   By: Ruel Favors M.D.   On: 09/01/2013 16:25   Ct Head Wo Contrast  09/01/2013   CLINICAL DATA:  Altered mental status ; multiple sclerosis  EXAM: CT HEAD WITHOUT CONTRAST  TECHNIQUE: Contiguous axial images were obtained from the base of the skull through the vertex without intravenous contrast.  COMPARISON:  Brain MRI July 03, 2013 and brain CT July 03, 2013  FINDINGS: There is mild generalized atrophy, stable. There is no demonstrable mass, hemorrhage, extra-axial fluid collection, or midline shift. Widespread decreased attenuation throughout the supratentorial white matter is consistent with known multiple sclerosis. No acute infarct is appreciable on this study. There is no obvious is new gray-white compartment lesion. Bony calvarium appears intact. The mastoid air cells are clear.  IMPRESSION: Atrophy with widespread decreased attenuation in the supratentorial white matter consistent with extensive changes of demyelination. No hemorrhage or well-defined mass is seen. There is no extra-axial fluid. Note that intravenous contrast is necessary to assess for plaque activity.    Electronically Signed   By: Bretta Bang M.D.   On: 09/01/2013 16:41     EKG Interpretation None      MDM   Final diagnoses:  Sacral decubitus ulcer, unspecified pressure ulcer stage   89 yr F with complex PMH aggressive worsening MS (with documented poor prognosis, recently started new DMARD to delay progression per Neurology 07/2013), chronic dementia, hx CVA, DM, HTN, HLD presents with gradual worsening altered mental status with confusion, incontinence, worsening generalized weakness (in setting of chronic weakness and debilitation in setting of MS), recently admitted in 06/2013 for MS flare and completed 1 week inpatient rehab. Currently patient arrives to ED from home via EMS, currently appears comfortable with stable vitals and NAD.   Proceed with generalized work-up with Head CT w/o contrast, CXR, CMET, CBC, Lactate, UA, UDS. Evaluate for any metabolic or possible infectious etiology for recent decline. Concern for infection in setting with sacral decub ulcer. Consult CSW for evaluation for SNF placement.  UPDATE @ 1458 - Patient returned from Head CT (no acute intracranial hemorrhage, noted chronic extensive demyelination changes), CXR (no acute findings), CMET with falsely elevated K 7.1 (hemolyzed specimen, ordered repeat), glucose 117, Cr 0.21, AST 102 (elevated), CBC significant for elevated WBC 12.8. Lactate 0.2, POCT-Trop 0.00. Son arrived to bedside for updates. Close inspection of sacral decub ulcer, highly suspicious for infection, unclear how extensive abscess is and concern for underlying osteo. Infected sacral decub ulcer is most likely etiology for AMS. Ordered further septic work-up per protocol, IV bolus per wt, blood culture x2, empiric antibiotics with Vanc / Zosyn IV, Foley cath placement for urine studies add Upreg. Ordered CT Abd/Pelvis w/ IV contrast to eval sacral decub.  Care transferred from attending Dr. Fonnie Jarvis to Dr. Loretha Stapler.  Saralyn Pilar,  DO 09/01/13 1713

## 2013-09-01 NOTE — Consult Note (Signed)
Reason for Consult:sacral decub Referring Physician: Dr Ernestina Patches, [facility]  [name] [name] is an 43 y.o. female.  HPI: *No family at bedside; patient can't participate in history so history obtained from previous documentation.   This is a 43 y.o. year old female with significant past medical history of progressive debilitating MS with secondary dementia, type 2 DM, depression presenting with encephalopathy, sepsis, sacral ulcer cellulitis. Level V caveat as pt is confused on exam. No family at bedside. Per report, pt was noted to have worsening progressive confusion over the past week by her son. Pt currently lives at home under the care of her son. Per report, pt has become progressively bedridden. Is usually wheelchair-bound. Also with worsening sacral wound with foul smell as well as increased dark urine with worsening odor.    Past Medical History  Diagnosis Date  . Multiple sclerosis   . HYPERTENSION 09/30/2006  . DIABETES MELLITUS, TYPE II 09/30/2006  . TIA (transient ischemic attack)   . TOBACCO USER 09/30/2006    Qualifier: Diagnosis of  By: Amil Amen MD, Benjamine Mola    . Multiple sclerosis, relapsing-remitting 05/29/2012  . DYSLIPIDEMIA 09/30/2006    Qualifier: Diagnosis of  By: Amil Amen MD, Benjamine Mola    . DIABETES MELLITUS, TYPE II 09/30/2006    Qualifier: Diagnosis of  By: Amil Amen MD, Benjamine Mola    . BENIGN POSITIONAL VERTIGO 01/27/2010    Qualifier: Diagnosis of  By: Amil Amen MD, Benjamine Mola    . Stroke   . Neuromuscular disorder     hx of ms  . Depression     History reviewed. No pertinent past surgical history.  Family History  Problem Relation Age of Onset  . Hypertension Mother   . Kidney failure Mother   . Hypertension Father   . Gout Father   . Hypertension Sister   . Diabetes Sister   . Hypertension Brother     Social History:  reports that she quit smoking about 2 years ago. She has never used smokeless tobacco. She reports that she does not drink alcohol or use illicit  drugs.  Allergies: No Known Allergies  Medications: I have reviewed the patient's current medications.  Results for orders placed during the hospital encounter of 09/01/13 (from the past 48 hour(s))  LACTIC ACID, PLASMA     Status: Abnormal   Collection Time    09/01/13  3:30 PM      Result Value Ref Range   Lactic Acid, Venous 0.2 (*) 0.5 - 2.2 mmol/L  COMPREHENSIVE METABOLIC PANEL     Status: Abnormal   Collection Time    09/01/13  3:30 PM      Result Value Ref Range   Sodium 138  137 - 147 mEq/L   Potassium 7.1 (*) 3.7 - 5.3 mEq/L   Comment: HEMOLYSIS AT THIS LEVEL MAY AFFECT RESULT     CRITICAL RESULT CALLED TO, READ BACK BY AND VERIFIED WITH:     A FIX,RN 1645 09/01/13 D BRADLEY   Chloride 103  96 - 112 mEq/L   CO2 24  19 - 32 mEq/L   Comment: HEMOLYSIS AT THIS LEVEL MAY AFFECT RESULT   Glucose, Bld 117 (*) 70 - 99 mg/dL   BUN 4 (*) 6 - 23 mg/dL   Creatinine, Ser 0.21 (*) 0.50 - 1.10 mg/dL   Calcium 8.2 (*) 8.4 - 10.5 mg/dL   Total Protein 7.1  6.0 - 8.3 g/dL   Albumin 2.7 (*) 3.5 - 5.2 g/dL   AST 102 (*)  0 - 37 U/L   ALT 24  0 - 35 U/L   Alkaline Phosphatase 92  39 - 117 U/L   Comment: HEMOLYSIS AT THIS LEVEL MAY AFFECT RESULT   Total Bilirubin 0.8  0.3 - 1.2 mg/dL   GFR calc non Af Amer >90  >90 mL/min   GFR calc Af Amer >90  >90 mL/min   Comment: (NOTE)     The eGFR has been calculated using the CKD EPI equation.     This calculation has not been validated in all clinical situations.     eGFR's persistently <90 mL/min signify possible Chronic Kidney     Disease.   Anion gap 11  5 - 15  CBC WITH DIFFERENTIAL     Status: Abnormal   Collection Time    09/01/13  3:30 PM      Result Value Ref Range   WBC 12.8 (*) 4.0 - 10.5 K/uL   RBC 3.76 (*) 3.87 - 5.11 MIL/uL   Hemoglobin 12.0  12.0 - 15.0 g/dL   HCT 34.8 (*) 36.0 - 46.0 %   MCV 92.6  78.0 - 100.0 fL   MCH 31.9  26.0 - 34.0 pg   MCHC 34.5  30.0 - 36.0 g/dL   RDW 13.3  11.5 - 15.5 %   Platelets 362  150 -  400 K/uL   Neutrophils Relative % 84 (*) 43 - 77 %   Neutro Abs 10.8 (*) 1.7 - 7.7 K/uL   Lymphocytes Relative 9 (*) 12 - 46 %   Lymphs Abs 1.2  0.7 - 4.0 K/uL   Monocytes Relative 6  3 - 12 %   Monocytes Absolute 0.8  0.1 - 1.0 K/uL   Eosinophils Relative 1  0 - 5 %   Eosinophils Absolute 0.1  0.0 - 0.7 K/uL   Basophils Relative 0  0 - 1 %   Basophils Absolute 0.0  0.0 - 0.1 K/uL  I-STAT TROPOININ, ED     Status: None   Collection Time    09/01/13  4:11 PM      Result Value Ref Range   Troponin i, poc 0.00  0.00 - 0.08 ng/mL   Comment 3            Comment: Due to the release kinetics of cTnI,     a negative result within the first hours     of the onset of symptoms does not rule out     myocardial infarction with certainty.     If myocardial infarction is still suspected,     repeat the test at appropriate intervals.  URINALYSIS, ROUTINE W REFLEX MICROSCOPIC     Status: Abnormal   Collection Time    09/01/13  5:35 PM      Result Value Ref Range   Color, Urine YELLOW  YELLOW   APPearance HAZY (*) CLEAR   Specific Gravity, Urine 1.015  1.005 - 1.030   pH 6.5  5.0 - 8.0   Glucose, UA NEGATIVE  NEGATIVE mg/dL   Hgb urine dipstick NEGATIVE  NEGATIVE   Bilirubin Urine NEGATIVE  NEGATIVE   Ketones, ur NEGATIVE  NEGATIVE mg/dL   Protein, ur NEGATIVE  NEGATIVE mg/dL   Urobilinogen, UA 1.0  0.0 - 1.0 mg/dL   Nitrite NEGATIVE  NEGATIVE   Leukocytes, UA TRACE (*) NEGATIVE  URINE RAPID DRUG SCREEN (HOSP PERFORMED)     Status: None   Collection Time    09/01/13  5:35 PM  Result Value Ref Range   Opiates NONE DETECTED  NONE DETECTED   Cocaine NONE DETECTED  NONE DETECTED   Benzodiazepines NONE DETECTED  NONE DETECTED   Amphetamines NONE DETECTED  NONE DETECTED   Tetrahydrocannabinol NONE DETECTED  NONE DETECTED   Barbiturates NONE DETECTED  NONE DETECTED   Comment:            DRUG SCREEN FOR MEDICAL PURPOSES     ONLY.  IF CONFIRMATION IS NEEDED     FOR ANY PURPOSE, NOTIFY  LAB     WITHIN 5 DAYS.                LOWEST DETECTABLE LIMITS     FOR URINE DRUG SCREEN     Drug Class       Cutoff (ng/mL)     Amphetamine      1000     Barbiturate      200     Benzodiazepine   607     Tricyclics       371     Opiates          300     Cocaine          300     THC              50  URINE MICROSCOPIC-ADD ON     Status: Abnormal   Collection Time    09/01/13  5:35 PM      Result Value Ref Range   Squamous Epithelial / LPF MANY (*) RARE   WBC, UA 0-2  <3 WBC/hpf   Bacteria, UA FEW (*) RARE   Urine-Other MUCOUS PRESENT    POC URINE PREG, ED     Status: None   Collection Time    09/01/13  6:01 PM      Result Value Ref Range   Preg Test, Ur NEGATIVE  NEGATIVE   Comment:            THE SENSITIVITY OF THIS     METHODOLOGY IS >24 mIU/mL  POTASSIUM     Status: Abnormal   Collection Time    09/01/13  6:10 PM      Result Value Ref Range   Potassium 2.9 (*) 3.7 - 5.3 mEq/L   Comment: NO VISIBLE HEMOLYSIS     CRITICAL RESULT CALLED TO, READ BACK BY AND VERIFIED WITH:     Donnie Mesa 1905 09/01/13 D BRADLEY  MAGNESIUM     Status: None   Collection Time    09/01/13  6:10 PM      Result Value Ref Range   Magnesium 1.8  1.5 - 2.5 mg/dL    Dg Chest 2 View  09/01/2013   CLINICAL DATA:  Weakness  EXAM: CHEST  2 VIEW  COMPARISON:  07/03/2013  FINDINGS: The heart size and mediastinal contours are within normal limits. Both lungs are clear. The visualized skeletal structures are unremarkable.  IMPRESSION: No active cardiopulmonary disease.   Electronically Signed   By: Daryll Brod M.D.   On: 09/01/2013 16:25   Ct Head Wo Contrast  09/01/2013   CLINICAL DATA:  Altered mental status ; multiple sclerosis  EXAM: CT HEAD WITHOUT CONTRAST  TECHNIQUE: Contiguous axial images were obtained from the base of the skull through the vertex without intravenous contrast.  COMPARISON:  Brain MRI July 03, 2013 and brain CT July 03, 2013  FINDINGS: There is mild generalized atrophy, stable.  There is no demonstrable mass,  hemorrhage, extra-axial fluid collection, or midline shift. Widespread decreased attenuation throughout the supratentorial white matter is consistent with known multiple sclerosis. No acute infarct is appreciable on this study. There is no obvious is new gray-white compartment lesion. Bony calvarium appears intact. The mastoid air cells are clear.  IMPRESSION: Atrophy with widespread decreased attenuation in the supratentorial white matter consistent with extensive changes of demyelination. No hemorrhage or well-defined mass is seen. There is no extra-axial fluid. Note that intravenous contrast is necessary to assess for plaque activity.   Electronically Signed   By: Lowella Grip M.D.   On: 09/01/2013 16:41   Ct Abdomen Pelvis W Contrast  09/01/2013   CLINICAL DATA:  Sacral ulcer ; possible abscess  EXAM: CT ABDOMEN AND PELVIS WITH CONTRAST  TECHNIQUE: Multidetector CT imaging of the abdomen and pelvis was performed using the standard protocol following bolus administration of intravenous contrast.  CONTRAST:  15m OMNIPAQUE IOHEXOL 300 MG/ML  SOLN  COMPARISON:  None.  FINDINGS: There is increased density within the subcutaneous and deeper soft tissues at the level of the lower sacrum and coccyx. There is no discrete fluid collection. No definite sinus tract communicating with the anus is demonstrated. Within the pelvis proper the urinary bladder contains a Foley catheter. The uterus and adnexal structures are grossly normal. The sigmoid colon and rectum contain a moderate amount of stool and gas. There is no free pelvic fluid.  The liver, gallbladder, pancreas, spleen, adrenal glands, and kidneys exhibit no acute abnormalities. There is mild fullness of the pancreatic head without evidence of a mass or inflammation. There is an 8 mm diameter hypodensity in the midpole of the left kidney most compatible with a cyst. The abdominal aorta exhibits normal caliber. The stomach and  small and large bowel exhibit no acute abnormality. The lung bases are clear. The lumbar spine and bony pelvis exhibit no acute abnormalities.  IMPRESSION: 1. There are inflammatory changes consistent with cellulitis in the soft tissues over the tip of the sacrum and coccyx. No abscess is demonstrated. 2. No acute intra-abdominal or pelvic abnormality is demonstrated.   Electronically Signed   By: David  JMartinique  On: 09/01/2013 19:49    Review of Systems  Unable to perform ROS: dementia   Blood pressure 143/91, pulse 101, temperature 98.6 F (37 C), temperature source Oral, resp. rate 14, weight 117 lb 11.6 oz (53.4 kg), last menstrual period 08/23/2012, SpO2 100.00%. Physical Exam  Vitals reviewed. Constitutional: She appears well-developed. No distress.  HENT:  Head: Normocephalic and atraumatic.  Right Ear: External ear normal.  Left Ear: External ear normal.  Eyes: Conjunctivae are normal. No scleral icterus.  Neck: Normal range of motion. Neck supple. No tracheal deviation present.  Cardiovascular: Normal rate and normal heart sounds.   Respiratory: Effort normal and breath sounds normal. No stridor. No respiratory distress. She has no wheezes.  GI: Soft. She exhibits no distension. There is no tenderness. There is no rebound.  Musculoskeletal: She exhibits no edema and no tenderness.  Lymphadenopathy:    She has no cervical adenopathy.  Neurological: She is alert. She exhibits normal muscle tone.  Not oriented - 2000- can't remember; season - winter; [place] Skin: Skin is warm and dry. No rash noted. She is not diaphoretic. No erythema. No pallor.  Attempted to take photos via haiku app - pic saved -for some reason-will not load. Large 8 in wide b/l sacral pressure ulcer in shape of figure of 8;  has thick leathery eschar intact; no real cellulitis. In midportion of ulcer at base near coccyx, small open in skin with some nonviable tissue; cant express any purulent  material. Old gauze has 'pseudomonas' odor but not covered in purulent drainage. Don't appreciate any significant fluctuance.   Psychiatric: She has a normal mood and affect. Her behavior is normal. Cognition and memory are impaired.      Assessment/Plan: MS Dementia/encephaopathy DM II HTN Leukocytosis Stage III sacral pressure ulcer  No significant abscess. No significant fluctuance on physical exam. CT doesn't show any significant fluid collection. Has 1 small opening at base of ulcer in midportion that could probably stand some bedside debridement.  Cont abx Wound care consult Will discuss with day team.  Leighton Ruff. Redmond Pulling, MD, FACS General, Bariatric, & Minimally Invasive Surgery [facility], [place]   [facility] M 09/01/2013, 10:37 PM

## 2013-09-01 NOTE — ED Notes (Signed)
Patient transported to X-ray 

## 2013-09-01 NOTE — ED Notes (Signed)
Patient transported to CT 

## 2013-09-01 NOTE — H&P (Signed)
Hospitalist Admission History and Physical  Patient name: [name] [name] Medical record number: [id number] Date of birth: 09/21/1970 Age: 43 y.o. Gender: female  Primary Care Provider: Jeanann Lewandowsky, MD  Chief Complaint: encephalopathy, sepsis, sacral ulcer cellulitis   History of Present Illness:This is a 43 y.o. year old female with significant past medical history of progressive debilitating MS with secondary dementia, type 2 DM, depression presenting with encephalopathy, sepsis, sacral ulcer cellulitis. Level V caveat as pt is confused on exam. No family at bedside. Per report, pt was noted to have worsening progressive confusion over the past week by her son. Pt currently lives at home under the care of her son. Per report, pt has become progressively bedridden. Is usually wheelchair-bound. Also with worsening sacral wound with foul smell as well as increased dark urine with worsening odor.  On presentation to the ER, afebrile. Heart rate in the 90s to 110s, blood pressure in the 110s to 150 systolic. Saturating 100% on room air. White blood cell count 12.8, hemoglobin 12, creatinine 0.21 initial potassium 7.1 with homolysis. Potassium 2.9 a recheck. Head CT shows widespread demyelination but no acute findings. Chest x-ray with no acute findings. CT of the abdomen and pelvis shows inflammatory changes consistent with cellulitis with tissues over the tip of the sacrum and coccyx but no true abscess. Patient started on vancomycin and Zosyn for broad-spectrum coverage. Blood and urine cultures obtained.  Assessment and Plan: [name] [name] is a 43 y.o. year old female presenting with encephalopathy    Active Problems:   Cellulitis   Encephalopathy   Encephalopathy/Cellulitis -Encephalopathy likely multifactorial in the setting of sepsis and baseline dementia/multiple sclerosis. -Check ammonia level -cont vanc and zosyn -pan culture -Discussed case with surgery. Will consult  as pt may benefit from debridement.  -Consider MRI of the brain.  2-dementia/multiple sclerosis -progressive disease -Continue antispasticity meds -In review of chart, looks like SNF placement is in place -f/u son  -PT/OT   3-HTN -stable  -cont coreg   4-DM -SSI, A1C  FEN/GI: NPO PMN. Replete K. Check mag level.   Prophylaxis: sub q heparin  Disposition: pending further evaluation  Code Status:Full Code    Patient Active Problem List   Diagnosis Date Noted  . Cellulitis 09/01/2013  . Encephalopathy 09/01/2013  . Secondary progressive multiple sclerosis 08/13/2013  . Multiple sclerosis exacerbation 07/03/2013  . Sepsis 06/08/2013  . Aspiration pneumonia 06/03/2013  . Depression 07/21/2012  . Constipation 06/03/2012  . Cutaneous abscess of buttock 06/03/2012  . TIA (transient ischemic attack) 05/31/2012  . Multiple sclerosis, relapsing-remitting 05/29/2012  . Right leg weakness 05/28/2012  . Fall 05/28/2012  . Leukocytosis 05/28/2012  . Hypokalemia 05/28/2012  . BENIGN POSITIONAL VERTIGO 01/27/2010  . CARPAL TUNNEL SYNDROME, LEFT 04/14/2009  . SHOULDER STRAIN, LEFT 03/03/2009  . DIABETES MELLITUS, TYPE II 09/30/2006  . DYSLIPIDEMIA 09/30/2006  . TOBACCO USER 09/30/2006  . HYPERTENSION 09/30/2006  . Multiple sclerosis 12/05/2005   Past Medical History: Past Medical History  Diagnosis Date  . Multiple sclerosis   . HYPERTENSION 09/30/2006  . DIABETES MELLITUS, TYPE II 09/30/2006  . TIA (transient ischemic attack)   . TOBACCO USER 09/30/2006    Qualifier: Diagnosis of  By: Delrae Alfred MD, Lanora Manis    . Multiple sclerosis, relapsing-remitting 05/29/2012  . DYSLIPIDEMIA 09/30/2006    Qualifier: Diagnosis of  By: Delrae Alfred MD, Lanora Manis    . DIABETES MELLITUS, TYPE II 09/30/2006    Qualifier: Diagnosis of  By: Delrae Alfred MD, Lanora Manis    .  BENIGN POSITIONAL VERTIGO 01/27/2010    Qualifier: Diagnosis of  By: Delrae Alfred MD, Lanora Manis    . Stroke   . Neuromuscular disorder     hx  of ms  . Depression     Past Surgical History: History reviewed. No pertinent past surgical history.  Social History: History   Social History  . Marital Status: Single    Spouse Name: N/A    Number of Children: N/A  . Years of Education: N/A   Social History Main Topics  . Smoking status: Former Smoker -- 0.25 packs/day    Quit date: 07/07/2011  . Smokeless tobacco: Never Used     Comment: quit smoking 2013  some time   . Alcohol Use: No  . Drug Use: No  . Sexual Activity: No   Other Topics Concern  . None   Social History Narrative  . None    Family History: Family History  Problem Relation Age of Onset  . Hypertension Mother   . Kidney failure Mother   . Hypertension Father   . Gout Father   . Hypertension Sister   . Diabetes Sister   . Hypertension Brother     Allergies: No Known Allergies  Current Facility-Administered Medications  Medication Dose Route Frequency Provider Last Rate Last Dose  . 0.9 %  sodium chloride infusion  1,000 mL Intravenous Continuous Hurman Horn, MD 125 mL/hr at 09/01/13 2057 1,000 mL at 09/01/13 2057  . 0.9 % NaCl with KCl 20 mEq/ L  infusion   Intravenous Continuous Doree Albee, MD      . heparin injection 5,000 Units  5,000 Units Subcutaneous 3 times per day Doree Albee, MD      . piperacillin-tazobactam (ZOSYN) IVPB 3.375 g  3.375 g Intravenous Q8H Benny Lennert, [facility]      . [START ON 09/02/2013] vancomycin (VANCOCIN) IVPB 750 mg/150 ml premix  750 mg Intravenous Q12H Benny Lennert, [facility]       Current Outpatient Prescriptions  Medication Sig Dispense Refill  . amLODipine (NORVASC) 5 MG tablet Take 1 tablet (5 mg total) by mouth daily.  30 tablet  1  . aspirin EC 81 MG tablet Take 1 tablet (81 mg total) by mouth daily.      . baclofen (LIORESAL) 10 MG tablet Take 0.5 tablets (5 mg total) by mouth 3 (three) times daily.  30 each  0  . citalopram (CELEXA) 10 MG tablet Take 1 tablet (10 mg total) by mouth daily.   30 tablet  2  . meclizine (ANTIVERT) 25 MG tablet Take 1 tablet (25 mg total) by mouth 3 (three) times daily as needed for dizziness or nausea.  90 tablet  11  . metFORMIN (GLUCOPHAGE) 500 MG tablet Take 1 tablet (500 mg total) by mouth daily with breakfast.  60 tablet  0  . metoprolol tartrate (LOPRESSOR) 25 MG tablet Take 1 tablet (25 mg total) by mouth 2 (two) times daily.  60 tablet  1   Review Of Systems: 12 point ROS negative except as noted above in HPI.  Physical Exam: Filed Vitals:   09/01/13 2045  BP: 155/92  Pulse: 103  Temp:   Resp: 16    General: confused, cooperative to exam  HEENT: PERRLA and extra ocular movement intact Heart: S1, S2 normal, no murmur, rub or gallop, regular rate and rhythm Lungs: clear to auscultation, no wheezes or rales and unlabored breathing Abdomen: abdomen is soft without significant tenderness, masses, organomegaly or guarding, +  stage 3-4 sacral ulcer with open ulceration at tip of coccyx directly adjacent to rectum Extremities: baseline diffuse spasticity  Skin:no rashes, no ecchymoses Neurology: normal without focal findings  Labs and Imaging: Lab Results  Component Value Date/Time   NA 138 09/01/2013  3:30 PM   K 2.9* 09/01/2013  6:10 PM   CL 103 09/01/2013  3:30 PM   CO2 24 09/01/2013  3:30 PM   BUN 4* 09/01/2013  3:30 PM   CREATININE 0.21* 09/01/2013  3:30 PM   CREATININE 0.76 09/17/2012  2:15 PM   GLUCOSE 117* 09/01/2013  3:30 PM   Lab Results  Component Value Date   WBC 12.8* 09/01/2013   HGB 12.0 09/01/2013   HCT 34.8* 09/01/2013   MCV 92.6 09/01/2013   PLT 362 09/01/2013    Dg Chest 2 View  09/01/2013   CLINICAL DATA:  Weakness  EXAM: CHEST  2 VIEW  COMPARISON:  07/03/2013  FINDINGS: The heart size and mediastinal contours are within normal limits. Both lungs are clear. The visualized skeletal structures are unremarkable.  IMPRESSION: No active cardiopulmonary disease.   Electronically Signed   By: Ruel Favors M.D.   On:  09/01/2013 16:25   Ct Head Wo Contrast  09/01/2013   CLINICAL DATA:  Altered mental status ; multiple sclerosis  EXAM: CT HEAD WITHOUT CONTRAST  TECHNIQUE: Contiguous axial images were obtained from the base of the skull through the vertex without intravenous contrast.  COMPARISON:  Brain MRI July 03, 2013 and brain CT July 03, 2013  FINDINGS: There is mild generalized atrophy, stable. There is no demonstrable mass, hemorrhage, extra-axial fluid collection, or midline shift. Widespread decreased attenuation throughout the supratentorial white matter is consistent with known multiple sclerosis. No acute infarct is appreciable on this study. There is no obvious is new gray-white compartment lesion. Bony calvarium appears intact. The mastoid air cells are clear.  IMPRESSION: Atrophy with widespread decreased attenuation in the supratentorial white matter consistent with extensive changes of demyelination. No hemorrhage or well-defined mass is seen. There is no extra-axial fluid. Note that intravenous contrast is necessary to assess for plaque activity.   Electronically Signed   By: Bretta Bang M.D.   On: 09/01/2013 16:41   Ct Abdomen Pelvis W Contrast  09/01/2013   CLINICAL DATA:  Sacral ulcer ; possible abscess  EXAM: CT ABDOMEN AND PELVIS WITH CONTRAST  TECHNIQUE: Multidetector CT imaging of the abdomen and pelvis was performed using the standard protocol following bolus administration of intravenous contrast.  CONTRAST:  OMNIPAQUE IOHEXOL 300 MG/ML  SOLN  COMPARISON:  None.  FINDINGS: There is increased density within the subcutaneous and deeper soft tissues at the level of the lower sacrum and coccyx. There is no discrete fluid collection. No definite sinus tract communicating with the anus is demonstrated. Within the pelvis proper the urinary bladder contains a Foley catheter. The uterus and adnexal structures are grossly normal. The sigmoid colon and rectum contain a moderate amount of stool and  gas. There is no free pelvic fluid.  The liver, gallbladder, pancreas, spleen, adrenal glands, and kidneys exhibit no acute abnormalities. There is mild fullness of the pancreatic head without evidence of a mass or inflammation. There is an 8 mm diameter hypodensity in the midpole of the left kidney most compatible with a cyst. The abdominal aorta exhibits normal caliber. The stomach and small and large bowel exhibit no acute abnormality. The lung bases are clear. The lumbar spine and bony pelvis exhibit no  acute abnormalities.  IMPRESSION: 1. There are inflammatory changes consistent with cellulitis in the soft tissues over the tip of the sacrum and coccyx. No abscess is demonstrated. 2. No acute intra-abdominal or pelvic abnormality is demonstrated.   Electronically Signed   By: David  [place]   On: 09/01/2013 19:49           Doree AlbeeSteven Marcy Bogosian MD  Pager: [phone]

## 2013-09-01 NOTE — ED Provider Notes (Signed)
Care assumed from Dr. Fonnie JarvisBednar.  CT shows evidence of cellulitis without abscess.  Will call internal medicine for admission.    Clinical Impression: 1. Sacral decubitus ulcer, unspecified pressure ulcer stage   2. Altered mental status, unspecified altered mental status type       [name] ChurnJohn David Dehlia [name] III, MD 09/02/13 1400

## 2013-09-01 NOTE — Progress Notes (Signed)
Clinical Social Work Department BRIEF PSYCHOSOCIAL ASSESSMENT 09/01/2013  Patient:  [name] [name], [name] [name]     Account Number:  [id number]     Admit date:  09/01/2013  Clinical Social Worker:  Illene Silver  Date/Time:  09/01/2013 08:38 PM  Referred by:  Physician  Date Referred:  09/01/2013 Referred for  SNF Placement   Other Referral:   Interview type:  Family Other interview type:   Database review.    PSYCHOSOCIAL DATA Living Status:  FAMILY Admitted from facility:   Level of care:   Primary support name:  quinton Nile Primary support relationship to patient:  CHILD, ADULT Degree of support available:   Good.  Pt's son reports that he and his fiancee care for pt, along with assistance from [facility].  Pt has also been approved for 71 hours a month through [employer].    CURRENT CONCERNS Current Concerns  Post-Acute Placement   Other Concerns:    SOCIAL WORK ASSESSMENT / PLAN CSW spoke with pt's sonMena Goes) via telephone.  Role of CSW/discharge planning explained.  Pt lives at home with son/his fiancee and they together provide 24 hour care to pt.  Pt also has [employer]) and was recently approved for CNA services through the [employer] program.  Per her son, pt's physical condition has deteriorated over the past week and she has become increasingly difficult to care for. Pt's son is requesting pt go to SNF for rehab prior to d/c home.  CSW will follow for NHP, complete bed search, and facilitate NH tx as appropriate.   Assessment/plan status:  Psychosocial Support/Ongoing Assessment of Needs Other assessment/ plan:   Information/referral to community resources:    PATIENT'S/FAMILY'S RESPONSE TO PLAN OF CARE: Pt is hopeful that pt can regain her strength and return home after d/c.  Pt thankful for CSW call this pm and is planning to return to the hospital to be with mother tonight.

## 2013-09-01 NOTE — ED Notes (Signed)
To ED via [facility] from home with son stating patient has had increasing weakness and has become bedridden in the past week after becoming incontinent on a regular basis. Son states pt does not know his name.

## 2013-09-01 NOTE — Progress Notes (Signed)
ANTIBIOTIC CONSULT NOTE - INITIAL  Pharmacy Consult for vancomycin/zosyn Indication: sacral decubitis ulcer  No Known Allergies  Patient Measurements: Wt= 53kg  Vital Signs: Temp: 98.6 F (37 C) (08/11 1431) Temp src: Oral (08/11 1431) BP: 140/87 mmHg (08/11 1701) Pulse Rate: 109 (08/11 1701) Intake/Output from previous day:   Intake/Output from this shift:    Labs:  Recent Labs  09/01/13 1530  WBC 12.8*  HGB 12.0  PLT 362  CREATININE 0.21*   The CrCl is unknown because both a height and weight (above a minimum accepted value) are required for this calculation. No results found for this basename: VANCOTROUGH, VANCOPEAK, VANCORANDOM, GENTTROUGH, GENTPEAK, GENTRANDOM, TOBRATROUGH, TOBRAPEAK, TOBRARND, AMIKACINPEAK, AMIKACINTROU, AMIKACIN,  in the last 72 hours   Microbiology: No results found for this or any previous visit (from the past 720 hour(s)).  Medical History: Past Medical History  Diagnosis Date  . Multiple sclerosis   . HYPERTENSION 09/30/2006  . DIABETES MELLITUS, TYPE II 09/30/2006  . TIA (transient ischemic attack)   . TOBACCO USER 09/30/2006    Qualifier: Diagnosis of  By: Delrae Alfred MD, Lanora Manis    . Multiple sclerosis, relapsing-remitting 05/29/2012  . DYSLIPIDEMIA 09/30/2006    Qualifier: Diagnosis of  By: Delrae Alfred MD, Lanora Manis    . DIABETES MELLITUS, TYPE II 09/30/2006    Qualifier: Diagnosis of  By: Delrae Alfred MD, Lanora Manis    . BENIGN POSITIONAL VERTIGO 01/27/2010    Qualifier: Diagnosis of  By: Delrae Alfred MD, Lanora Manis    . Stroke   . Neuromuscular disorder     hx of ms  . Depression     Assessment: 43 yo female with sacral decubitis ulcer suspicious for infection and concern for osteo to begin vancomycin and zosyn.  WBC= 12.8, afebrile, and SCr= 0.21 (CrCl difficult to determine).  Zosyn 1000mg  x1 and Zosyn 3.375gm x1 have been ordered in the ED.  8/11 zosyn>> 8/11 vanc>>  8/11 urine 8/11 wound 8/11 blood x2  Goal of Therapy:  Vancomycin trough  level 15-20 mcg/ml  Plan:  -Vancomycin 750mg  IV q12h -Zosyn 3.375gm IV q8h -Will check a vancomycin level before the 3rd or 4th dose -Will follow renal function, cultures and clinical progress  Harland German, Pharm D 09/01/2013 5:47 PM

## 2013-09-02 ENCOUNTER — Encounter (HOSPITAL_COMMUNITY): Payer: Self-pay | Admitting: General Practice

## 2013-09-02 DIAGNOSIS — F3289 Other specified depressive episodes: Secondary | ICD-10-CM

## 2013-09-02 DIAGNOSIS — F329 Major depressive disorder, single episode, unspecified: Secondary | ICD-10-CM

## 2013-09-02 DIAGNOSIS — G35 Multiple sclerosis: Secondary | ICD-10-CM

## 2013-09-02 LAB — COMPREHENSIVE METABOLIC PANEL
ALBUMIN: 2.6 g/dL — AB (ref 3.5–5.2)
ALK PHOS: 108 U/L (ref 39–117)
ALT: 12 U/L (ref 0–35)
AST: 15 U/L (ref 0–37)
Anion gap: 14 (ref 5–15)
BILIRUBIN TOTAL: 0.9 mg/dL (ref 0.3–1.2)
BUN: <3 mg/dL — ABNORMAL LOW (ref 6–23)
CHLORIDE: 103 meq/L (ref 96–112)
CO2: 24 mEq/L (ref 19–32)
Calcium: 8.6 mg/dL (ref 8.4–10.5)
Creatinine, Ser: 0.28 mg/dL — ABNORMAL LOW (ref 0.50–1.10)
GFR calc Af Amer: >90 mL/min (ref >90)
GFR calc non Af Amer: >90 mL/min (ref >90)
Glucose, Bld: 97 mg/dL (ref 70–99)
Potassium: 2.7 mEq/L — CL (ref 3.7–5.3)
SODIUM: 141 meq/L (ref 137–147)
Total Protein: 6.2 g/dL (ref 6.0–8.3)

## 2013-09-02 LAB — CBC WITH DIFFERENTIAL/PLATELET
BASOS ABS: 0 10*3/uL (ref 0.0–0.1)
Basophils Relative: 0 % (ref 0–1)
EOS PCT: 2 % (ref 0–5)
Eosinophils Absolute: 0.3 10*3/uL (ref 0.0–0.7)
HEMATOCRIT: 36.7 % (ref 36.0–46.0)
HEMOGLOBIN: 12.4 g/dL (ref 12.0–15.0)
Lymphocytes Relative: 16 % (ref 12–46)
Lymphs Abs: 2.3 10*3/uL (ref 0.7–4.0)
MCH: 31.3 pg (ref 26.0–34.0)
MCHC: 33.8 g/dL (ref 30.0–36.0)
MCV: 92.7 fL (ref 78.0–100.0)
MONO ABS: 1.4 10*3/uL — AB (ref 0.1–1.0)
MONOS PCT: 9 % (ref 3–12)
Neutro Abs: 10.8 10*3/uL — ABNORMAL HIGH (ref 1.7–7.7)
Neutrophils Relative %: 73 % (ref 43–77)
Platelets: 568 10*3/uL — ABNORMAL HIGH (ref 150–400)
RBC: 3.96 MIL/uL (ref 3.87–5.11)
RDW: 12.7 % (ref 11.5–15.5)
WBC: 14.8 10*3/uL — ABNORMAL HIGH (ref 4.0–10.5)

## 2013-09-02 LAB — HEMOGLOBIN A1C
Hgb A1c MFr Bld: 5.5 % (ref <5.7)
MEAN PLASMA GLUCOSE: 111 mg/dL (ref <117)

## 2013-09-02 LAB — GLUCOSE, CAPILLARY
GLUCOSE-CAPILLARY: 101 mg/dL — AB (ref 70–99)
GLUCOSE-CAPILLARY: 110 mg/dL — AB (ref 70–99)
Glucose-Capillary: 98 mg/dL (ref 70–99)
Glucose-Capillary: 128 mg/dL — ABNORMAL HIGH (ref 70–99)
Glucose-Capillary: 97 mg/dL (ref 70–99)
Glucose-Capillary: 127 mg/dL — ABNORMAL HIGH (ref 70–99)

## 2013-09-02 LAB — URINE CULTURE: Colony Count: 40000

## 2013-09-02 MED ORDER — POTASSIUM CHLORIDE 10 MEQ/100ML IV SOLN
10.0000 meq | INTRAVENOUS | Status: AC
  Administered 2013-09-02 (×6): 10 meq via INTRAVENOUS
  Filled 2013-09-02 (×6): qty 100

## 2013-09-02 MED ORDER — HYDROMORPHONE HCL PF 1 MG/ML IJ SOLN
1.0000 mg | INTRAMUSCULAR | Status: DC | PRN
  Administered 2013-09-02 – 2013-09-07 (×10): 1 mg via INTRAVENOUS
  Filled 2013-09-02 (×11): qty 1

## 2013-09-02 MED ORDER — POTASSIUM CHLORIDE CRYS ER 20 MEQ PO TBCR
40.0000 meq | EXTENDED_RELEASE_TABLET | Freq: Every day | ORAL | Status: DC
  Administered 2013-09-02 – 2013-09-07 (×6): 40 meq via ORAL
  Filled 2013-09-02 (×8): qty 2

## 2013-09-02 MED ORDER — COLLAGENASE 250 UNIT/GM EX OINT
TOPICAL_OINTMENT | Freq: Every day | CUTANEOUS | Status: DC
  Administered 2013-09-02 – 2013-09-06 (×5): via TOPICAL
  Administered 2013-09-07: 1 via TOPICAL
  Filled 2013-09-02 (×3): qty 30

## 2013-09-02 NOTE — Progress Notes (Addendum)
CRITICAL VALUE ALERT  Critical value received:  k 2.7  Date of notification:  09/02/13  Time of notification: 0714  Critical value read back:Yes.    Nurse who received alert: Marlyne Beards  MD notified (1st page):  Dr. Jomarie Longs   Time of first page:  0718  MD notified (2nd page):  Time of second page:  Responding MD: Dr. Jomarie Longs Time MD responded: [id number]

## 2013-09-02 NOTE — Progress Notes (Signed)
Utilization review completed.  

## 2013-09-02 NOTE — Progress Notes (Signed)
[facility] PROGRESS NOTE  ALYLA PIETILA ZOX:[id number] DOB: 04-05-1970 DOA: 09/01/2013 PCP: Jeanann Lewandowsky, MD  Assessment/Plan: 1. Wound infection/sacral decubitus ulcers -COntinue hydrotherapy, Wound care -Empiric Abx -may need Debridement if not improving -FU cultures  2. Metabolic Encephalopathy  -Encephalopathy likely multifactorial in the setting of sepsis and baseline dementia/multiple sclerosis.  -improving, cont vanc and zosyn  -pan culture   3-dementia/multiple sclerosis  -progressive disease  -Continue antispasticity meds  -CSW consult, may need SNF placement  4-HTN  -stable  -cont coreg   5-DM  -SSI, A1C pending  6. Hypokalemia -replace  DVT proph: Hep SQ  Code Status: Full COde Family Communication: none at bedside Disposition Plan: Home when stable   Consultants:  CCS  Antibiotics:  Vanc/Zosyn  HPI/Subjective: No complaints  Objective: Filed Vitals:   09/02/13 1311  BP: 117/80  Pulse: 85  Temp: 99.2 F (37.3 C)  Resp: 18    Intake/Output Summary (Last 24 hours) at 09/02/13 1626 Last data filed at 09/02/13 1547  Gross per 24 hour  Intake      0 ml  Output   1600 ml  Net  -1600 ml   Filed Weights   09/01/13 1742 09/01/13 2353 09/02/13 0458  Weight: 53.4 kg (117 lb 11.6 oz) 53.8 kg (118 lb 9.7 oz) 53.8 kg (118 lb 9.7 oz)    Exam:   General:  AAOx to self, place, chronically ill appearing  Cardiovascular: S1S2/RRR  Respiratory: CTAB  Abdomen: soft, Nt, BS present  Musculoskeletal: no edema c/c   Decubitus ulcer with dressing  Data Reviewed: Basic Metabolic Panel:  Recent Labs Lab 09/01/13 1530 09/01/13 1810 09/02/13 0542  NA 138  --  141  K 7.1* 2.9* 2.7*  CL 103  --  103  CO2 24  --  24  GLUCOSE 117*  --  97  BUN 4*  --  <3*  CREATININE 0.21*  --  0.28*  CALCIUM 8.2*  --  8.6  MG  --  1.8  --    Liver Function Tests:  Recent Labs Lab 09/01/13 1530 09/02/13 0542  AST 102* 15  ALT 24  12  ALKPHOS 92 108  BILITOT 0.8 0.9  PROT 7.1 6.2  ALBUMIN 2.7* 2.6*   No results found for this basename: LIPASE, AMYLASE,  in the last 168 hours No results found for this basename: AMMONIA,  in the last 168 hours CBC:  Recent Labs Lab 09/01/13 1530 09/02/13 0542  WBC 12.8* 14.8*  NEUTROABS 10.8* 10.8*  HGB 12.0 12.4  HCT 34.8* 36.7  MCV 92.6 92.7  PLT 362 568*   Cardiac Enzymes: No results found for this basename: CKTOTAL, CKMB, CKMBINDEX, TROPONINI,  in the last 168 hours BNP (last 3 results) No results found for this basename: PROBNP,  in the last 8760 hours CBG:  Recent Labs Lab 09/02/13 0042 09/02/13 0625 09/02/13 0745 09/02/13 1207  GLUCAP 110* 98 128* 97    Recent Results (from the past 240 hour(s))  WOUND CULTURE     Status: None   Collection Time    09/01/13  5:30 PM      Result Value Ref Range Status   Specimen Description WOUND PERIRECTAL   Final   Special Requests NONE   Final   Gram Stain     Final   Value: NO     WBC NO SQUAMOUS EPITHELIAL CELLS SEEN     MODERATE GRAM POSITIVE COCCI     IN PAIRS FEW GRAM POSITIVE  RODS     Performed at [facility]     Final   Value: Culture reincubated for better growth     Performed at [facility]   Report Status PENDING   Incomplete  CULTURE, BLOOD (ROUTINE X 2)     Status: None   Collection Time    09/01/13  6:00 PM      Result Value Ref Range Status   Specimen Description BLOOD ARM LEFT   Final   Special Requests BOTTLES DRAWN AEROBIC AND ANAEROBIC 10CC   Final   Culture  Setup Time     Final   Value: 09/01/2013 23:02     Performed at [facility]   Culture     Final   Value:        BLOOD CULTURE RECEIVED NO GROWTH TO DATE CULTURE WILL BE HELD FOR 5 DAYS BEFORE ISSUING A FINAL NEGATIVE REPORT     Performed at [facility]   Report Status PENDING   Incomplete  CULTURE, BLOOD (ROUTINE X 2)     Status: None   Collection Time    09/01/13  6:10 PM      Result  Value Ref Range Status   Specimen Description BLOOD ARM LEFT   Final   Special Requests BOTTLES DRAWN AEROBIC ONLY 10CC   Final   Culture  Setup Time     Final   Value: 09/01/2013 23:01     Performed at [facility]   Culture     Final   Value:        BLOOD CULTURE RECEIVED NO GROWTH TO DATE CULTURE WILL BE HELD FOR 5 DAYS BEFORE ISSUING A FINAL NEGATIVE REPORT     Performed at [facility]   Report Status PENDING   Incomplete     Studies: Dg Chest 2 View  09/01/2013   CLINICAL DATA:  Weakness  EXAM: CHEST  2 VIEW  COMPARISON:  07/03/2013  FINDINGS: The heart size and mediastinal contours are within normal limits. Both lungs are clear. The visualized skeletal structures are unremarkable.  IMPRESSION: No active cardiopulmonary disease.   Electronically Signed   By: Ruel Favors M.D.   On: 09/01/2013 16:25   Ct Head Wo Contrast  09/01/2013   CLINICAL DATA:  Altered mental status ; multiple sclerosis  EXAM: CT HEAD WITHOUT CONTRAST  TECHNIQUE: Contiguous axial images were obtained from the base of the skull through the vertex without intravenous contrast.  COMPARISON:  Brain MRI July 03, 2013 and brain CT July 03, 2013  FINDINGS: There is mild generalized atrophy, stable. There is no demonstrable mass, hemorrhage, extra-axial fluid collection, or midline shift. Widespread decreased attenuation throughout the supratentorial white matter is consistent with known multiple sclerosis. No acute infarct is appreciable on this study. There is no obvious is new gray-white compartment lesion. Bony calvarium appears intact. The mastoid air cells are clear.  IMPRESSION: Atrophy with widespread decreased attenuation in the supratentorial white matter consistent with extensive changes of demyelination. No hemorrhage or well-defined mass is seen. There is no extra-axial fluid. Note that intravenous contrast is necessary to assess for plaque activity.   Electronically Signed   By: Bretta Bang  M.D.   On: 09/01/2013 16:41   Ct Abdomen Pelvis W Contrast  09/01/2013   CLINICAL DATA:  Sacral ulcer ; possible abscess  EXAM: CT ABDOMEN AND PELVIS WITH CONTRAST  TECHNIQUE: Multidetector CT imaging of the abdomen and pelvis  was performed using the standard protocol following bolus administration of intravenous contrast.  CONTRAST:  100mL OMNIPAQUE IOHEXOL 300 MG/ML  SOLN  COMPARISON:  None.  FINDINGS: There is increased density within the subcutaneous and deeper soft tissues at the level of the lower sacrum and coccyx. There is no discrete fluid collection. No definite sinus tract communicating with the anus is demonstrated. Within the pelvis proper the urinary bladder contains a Foley catheter. The uterus and adnexal structures are grossly normal. The sigmoid colon and rectum contain a moderate amount of stool and gas. There is no free pelvic fluid.  The liver, gallbladder, pancreas, spleen, adrenal glands, and kidneys exhibit no acute abnormalities. There is mild fullness of the pancreatic head without evidence of a mass or inflammation. There is an 8 mm diameter hypodensity in the midpole of the left kidney most compatible with a cyst. The abdominal aorta exhibits normal caliber. The stomach and small and large bowel exhibit no acute abnormality. The lung bases are clear. The lumbar spine and bony pelvis exhibit no acute abnormalities.  IMPRESSION: 1. There are inflammatory changes consistent with cellulitis in the soft tissues over the tip of the sacrum and coccyx. No abscess is demonstrated. 2. No acute intra-abdominal or pelvic abnormality is demonstrated.   Electronically Signed   By: David  [place]   On: 09/01/2013 19:49    Scheduled Meds: . amLODipine  5 mg Oral Daily  . aspirin EC  81 mg Oral Daily  . baclofen  5 mg Oral TID  . citalopram  10 mg Oral Daily  . collagenase   Topical Daily  . heparin  5,000 Units Subcutaneous 3 times per day  . insulin aspart  0-9 Units Subcutaneous 6 times per  day  . metoprolol tartrate  25 mg Oral BID  . piperacillin-tazobactam (ZOSYN)  IV  3.375 g Intravenous Q8H  . potassium chloride  10 mEq Intravenous Q1 Hr x 6  . potassium chloride  40 mEq Oral Daily  . vancomycin  750 mg Intravenous Q12H   Continuous Infusions: . 0.9 % NaCl with KCl 20 mEq / L 50 mL/hr at 09/02/13 1158   Antibiotics Given (last 72 hours)   Date/Time Action Medication Dose Rate   09/02/13 0546 Given   vancomycin (VANCOCIN) IVPB 750 mg/150 ml premix 750 mg 150 mL/hr   09/02/13 0858 Given   piperacillin-tazobactam (ZOSYN) IVPB 3.375 g 3.375 g 12.5 mL/hr   09/02/13 1548 Given   piperacillin-tazobactam (ZOSYN) IVPB 3.375 g 3.375 g 12.5 mL/hr      Active Problems:   Cellulitis   Encephalopathy    Time spent: 35min    [facility] HospitalJOSEPH,Moselle Rister  [facility] Pager [phone]. If 7PM-7AM, please contact night-coverage at [email or web address], password [email or web address] 09/02/2013, 4:26 PM  LOS: 1 day

## 2013-09-02 NOTE — Progress Notes (Signed)
Physical Therapy Wound Evaluation and Treatment Patient Details  Name: [name] [name] MRN: [id number] Date of Birth: 12/18/1970  Today's Date: 09/02/2013 Time: 6073-7106 Time Calculation (min): 39 min  Subjective  Subjective: please stop....it hurts Patient and Family Stated Goals: pt did not participate, but agreed needed to get healed up. Date of Onset:  (for a while leading up to admission)  Pain Score:    Wound Assessment  Pressure Ulcer 09/01/13 Unstageable - Full thickness tissue loss in which the base of the ulcer is covered by slough (yellow, tan, gray, green or brown) and/or eschar (tan, brown or black) in the wound bed. (Active)  Dressing Type ABD;Gauze (Comment);Barrier Film (skin prep) 09/02/2013  3:00 PM  Dressing Changed;Clean;Dry;Intact 09/02/2013  3:00 PM  Dressing Change Frequency Daily 09/02/2013  3:00 PM  State of Healing Eschar 09/02/2013  3:00 PM  Site / Wound Assessment Pink;Brown;Black;Yellow 09/02/2013  3:00 PM  % Wound base Red or Granulating 10% 09/02/2013  3:00 PM  % Wound base Yellow 10% 09/02/2013  3:00 PM  % Wound base Black 80% 09/02/2013  3:00 PM  Peri-wound Assessment Intact;Pink 09/02/2013  3:00 PM  Wound Length (cm) 10 cm 09/02/2013  3:00 PM  Wound Width (cm) 15 cm 09/02/2013  3:00 PM  Wound Depth (cm) 1 cm 09/02/2013  3:00 PM  Margins Unattached edges (unapproximated) 09/02/2013  3:00 PM  Drainage Amount Minimal 09/02/2013  3:00 PM  Drainage Description Serous;Serosanguineous;Purulent;Odor 09/02/2013  3:00 PM  Treatment Cleansed;Debridement (Selective);Hydrotherapy (Pulse lavage);Other (Comment) 09/02/2013  3:00 PM     Pressure Ulcer 09/01/13 Stage II -  Partial thickness loss of dermis presenting as a shallow open ulcer with a red, pink wound bed without slough. (Active)  Dressing Type Foam 09/02/2013  7:48 AM  Dressing Changed;Clean;Dry;Intact 09/02/2013  7:48 AM  Site / Wound Assessment Yellow;Pink 09/02/2013  7:48 AM  % Wound base Red or Granulating 75%  09/02/2013  7:48 AM  % Wound base Yellow 25% 09/02/2013  7:48 AM  Peri-wound Assessment Maceration 09/01/2013 11:45 AM  Wound Length (cm) 1.2 cm 09/02/2013  7:48 AM  Wound Width (cm) 1 cm 09/02/2013  7:48 AM  Wound Depth (cm) 0 cm 09/02/2013  7:48 AM  Tunneling (cm) 0 09/02/2013  7:48 AM  Undermining (cm) 0 09/02/2013  7:48 AM  Margins Unattached edges (unapproximated) 09/02/2013  7:48 AM  Drainage Amount None 09/02/2013  7:48 AM  Drainage Description Serosanguineous 09/01/2013 11:45 AM     Pressure Ulcer 09/01/13 Deep Tissue Injury - Purple or maroon localized area of discolored intact skin or blood-filled blister due to damage of underlying soft tissue from pressure and/or shear. (Active)  Dressing Type Foam 09/02/2013  7:48 AM  Dressing Changed;Clean;Dry;Intact 09/02/2013  7:48 AM  Site / Wound Assessment Purple 09/02/2013  7:48 AM  Peri-wound Assessment Intact 09/02/2013  7:48 AM  Wound Length (cm) 2.5 cm 09/02/2013  7:48 AM  Wound Width (cm) 3 cm 09/02/2013  7:48 AM  Wound Depth (cm) 0 cm 09/02/2013  7:48 AM  Tunneling (cm) 0 09/02/2013  7:48 AM  Undermining (cm) 0 09/02/2013  7:48 AM  Margins Attached edges (approximated) 09/02/2013  7:48 AM  Drainage Amount None 09/02/2013  7:48 AM     Pressure Ulcer 09/01/13 Stage II -  Partial thickness loss of dermis presenting as a shallow open ulcer with a red, pink wound bed without slough. (Active)  Dressing Type Foam 09/02/2013  7:48 AM  Dressing Changed;Clean;Intact;Dry 09/02/2013  7:48 AM  State of Healing  Early/partial granulation 09/01/2013 11:45 AM  Site / Wound Assessment Yellow 09/02/2013  7:48 AM  Peri-wound Assessment Intact 09/02/2013  7:48 AM  Wound Length (cm) 0.6 cm 09/02/2013  7:48 AM  Wound Width (cm) 0.6 cm 09/02/2013  7:48 AM  Wound Depth (cm) 0 cm 09/02/2013  7:48 AM  Tunneling (cm) 0 09/02/2013  7:48 AM  Undermining (cm) 0 09/02/2013  7:48 AM  Margins Unattached edges (unapproximated) 09/02/2013  7:48 AM  Drainage Amount None 09/02/2013  7:48 AM   Drainage Description Serosanguineous 09/01/2013 11:45 AM     Pressure Ulcer 09/01/13 Stage II -  Partial thickness loss of dermis presenting as a shallow open ulcer with a red, pink wound bed without slough. (Active)  Dressing Type Foam 09/02/2013  7:48 AM  Dressing Changed;Clean;Dry;Intact 09/02/2013  7:48 AM  State of Healing Early/partial granulation 09/01/2013 11:45 AM  Site / Wound Assessment Pink 09/02/2013  7:48 AM  % Wound base Red or Granulating 10% 09/01/2013 11:45 AM  Peri-wound Assessment Intact 09/02/2013  7:48 AM  Wound Length (cm) 1 cm 09/02/2013  7:48 AM  Wound Width (cm) 0.8 cm 09/02/2013  7:48 AM  Wound Depth (cm) 0 cm 09/02/2013  7:48 AM  Tunneling (cm) 0 09/02/2013  7:48 AM  Undermining (cm) 0 09/02/2013  7:48 AM  Margins Unattached edges (unapproximated) 09/02/2013  7:48 AM  Drainage Amount None 09/02/2013  7:48 AM  Drainage Description Sanguineous;Serosanguineous 09/01/2013 11:45 AM     Pressure Ulcer 09/02/13 Stage II -  Partial thickness loss of dermis presenting as a shallow open ulcer with a red, pink wound bed without slough. (Active)  Dressing Type Foam 09/02/2013  7:48 AM  Dressing Changed;Clean;Intact;Dry 09/02/2013  7:48 AM  Site / Wound Assessment Dry;Clean 09/02/2013  7:48 AM  Peri-wound Assessment Intact 09/02/2013  7:48 AM  Wound Length (cm) 0.8 cm 09/02/2013  7:48 AM  Wound Width (cm) 1.4 cm 09/02/2013  7:48 AM  Wound Depth (cm) 0 cm 09/02/2013  7:48 AM  Tunneling (cm) 0 09/02/2013  7:48 AM  Undermining (cm) 0 09/02/2013  7:48 AM  Margins Unattached edges (unapproximated) 09/02/2013  7:48 AM  Drainage Amount None 09/02/2013  7:48 AM     Wound / Incision (Open or Dehisced) 09/02/13 Other (Comment) Neck Mid;Lower skin tear (Active)  Dressing Type Other (Comment) 09/02/2013  7:48 AM  Site / Wound Assessment Pink 09/02/2013  7:48 AM  % Wound base Red or Granulating 100% 09/02/2013  7:48 AM  Peri-wound Assessment Intact 09/02/2013  7:48 AM  Wound Length (cm) 0.4 cm 09/02/2013   7:48 AM  Wound Width (cm) 1.5 cm 09/02/2013  7:48 AM  Wound Depth (cm) 0 cm 09/02/2013  7:48 AM  Tunneling (cm) 0 09/02/2013  7:48 AM  Undermining (cm) 0 09/02/2013  7:48 AM  Margins Attached edges (approximated) 09/02/2013  7:48 AM  Closure None 09/02/2013  7:48 AM  Drainage Amount None 09/02/2013  7:48 AM   Hydrotherapy Pulsed lavage therapy - wound location: sacrum Pulsed Lavage with Suction (psi): 8 psi (to 12) Pulsed Lavage with Suction - Normal Saline Used: 1000 mL Pulsed Lavage Tip: Tip with splash shield Selective Debridement Selective Debridement - Location: sacrum Selective Debridement - Tools Used: Scalpel;Forceps Selective Debridement - Tissue Removed: eschar (and cross-hatched the entire area.)   Wound Assessment and Plan  Wound Therapy - Assess/Plan/Recommendations Wound Therapy - Clinical Statement: Pt has alot of thick, tenacious eschar to remove before the wound can be fully staged, but pt can benefit from  PLS or [place] mist to cleanse, decrease bacterial load and hopefully allow debridement to get down to health y tissues. Wound Therapy - Functional Problem List: MS,  decreased ability to mobilize Factors Delaying/Impairing Wound Healing: Immobility;Infection - systemic/local Hydrotherapy Plan: Debridement;Dressing change;Patient/family education;Pulsatile lavage with suction;Ultrasonic wound therapy _0  KHz (+/- 3 KHz) Wound Therapy - Frequency: 6X / week Wound Therapy - Follow Up Recommendations: Home health RN Wound Plan: debridement and dressing change.  Perhaps will need to change between PLS and [place] mist  Wound Therapy Goals- Improve the function of patient's integumentary system by progressing the wound(s) through the phases of wound healing (inflammation - proliferation - remodeling) by: Decrease Necrotic Tissue to: 60 Decrease Necrotic Tissue - Progress: Goal set today Increase Granulation Tissue to: 40 Increase Granulation Tissue - Progress: Goal set today Improve  Drainage Characteristics: Min;Serous Improve Drainage Characteristics - Progress: Goal set today Goals/treatment plan/discharge plan were made with and agreed upon by patient/family: Yes Time For Goal Achievement: 7 days Wound Therapy - Potential for Goals: Good  Goals will be updated until maximal potential achieved or discharge criteria met.  Discharge criteria: when goals achieved, discharge from hospital, MD decision/surgical intervention, no progress towards goals, refusal/missing three consecutive treatments without notification or medical reason.  GP     Raja Liska, Tessie Fass 09/02/2013, 3:31 PM

## 2013-09-02 NOTE — Progress Notes (Signed)
Patient ID: [name] [name], female   DOB: 12/06/1970, 43 y.o.   MRN: [id number]     [address] [place], [place] [address]    Phone: [phone] FAX: [phone]     Subjective: Alert to person and place.  C/o pain with turning.  WBC up.  Afebrile.    Objective:  Vital signs:  Filed Vitals:   09/01/13 2215 09/01/13 2300 09/01/13 2353 09/02/13 0458  BP: 143/91 143/85 147/93 139/95  Pulse: 101 103 98 117  Temp:   98.9 F (37.2 C) 98.6 F (37 C)  TempSrc:   Oral Oral  Resp: _0 Height:   _1  (1.549 m)   Weight:   118 lb 9.7 oz (53.8 kg) 118 lb 9.7 oz (53.8 kg)  SpO2: 100% 100% 100% 100%    Last BM Date:  (PTA - pt poor historian and does not remember)  Intake/Output   Yesterday:    This shift:  Total I/O In: 0  Out: 1000 [Urine:1000]   Physical Exam: General: Pt awake/alert/oriented x2 in no acute distress Chest: CTA.  No chest wall pain w good excursion CV:  Pulses intact.  Regular rhythm Abdomen: Soft.  Nondistended. Non tender.  No evidence of peritonitis.  No incarcerated hernias. Skin: large sacral decub, Center of photo-small opening with some exudate, no palpable abscess.  Will try hydrotherapy and santyl to this area.      Problem List:   Active Problems:   Cellulitis   Encephalopathy    Results:   Labs: Results for orders placed during the hospital encounter of 09/01/13 (from the past 48 hour(s))  LACTIC ACID, PLASMA     Status: Abnormal   Collection Time    09/01/13  3:30 PM      Result Value Ref Range   Lactic Acid, Venous 0.2 (*) 0.5 - 2.2 mmol/L  COMPREHENSIVE METABOLIC PANEL     Status: Abnormal   Collection Time    09/01/13  3:30 PM      Result Value Ref Range   Sodium 138  137 - 147 mEq/L   Potassium 7.1 (*) 3.7 - 5.3 mEq/L   Comment: HEMOLYSIS AT THIS LEVEL MAY AFFECT RESULT     CRITICAL RESULT CALLED TO, READ BACK BY AND VERIFIED WITH:     A FIX,RN  1645 09/01/13 D BRADLEY   Chloride 103  96 - 112 mEq/L   CO2 24  19 - 32 mEq/L   Comment: HEMOLYSIS AT THIS LEVEL MAY AFFECT RESULT   Glucose, Bld 117 (*) 70 - 99 mg/dL   BUN 4 (*) 6 - 23 mg/dL   Creatinine, Ser 0.21 (*) 0.50 - 1.10 mg/dL   Calcium 8.2 (*) 8.4 - 10.5 mg/dL   Total Protein 7.1  6.0 - 8.3 g/dL   Albumin 2.7 (*) 3.5 - 5.2 g/dL   AST 102 (*) 0 - 37 U/L   ALT 24  0 - 35 U/L   Alkaline Phosphatase 92  39 - 117 U/L   Comment: HEMOLYSIS AT THIS LEVEL MAY AFFECT RESULT   Total Bilirubin 0.8  0.3 - 1.2 mg/dL   GFR calc non Af Amer >90  >90 mL/min   GFR calc Af Amer >90  >90 mL/min   Comment: (NOTE)     The eGFR has been calculated using the CKD EPI equation.     This calculation has not been  validated in all clinical situations.     eGFR's persistently <90 mL/min signify possible Chronic Kidney     Disease.   Anion gap 11  5 - 15  CBC WITH DIFFERENTIAL     Status: Abnormal   Collection Time    09/01/13  3:30 PM      Result Value Ref Range   WBC 12.8 (*) 4.0 - 10.5 K/uL   RBC 3.76 (*) 3.87 - 5.11 MIL/uL   Hemoglobin 12.0  12.0 - 15.0 g/dL   HCT 34.8 (*) 36.0 - 46.0 %   MCV 92.6  78.0 - 100.0 fL   MCH 31.9  26.0 - 34.0 pg   MCHC 34.5  30.0 - 36.0 g/dL   RDW 13.3  11.5 - 15.5 %   Platelets 362  150 - 400 K/uL   Neutrophils Relative % 84 (*) 43 - 77 %   Neutro Abs 10.8 (*) 1.7 - 7.7 K/uL   Lymphocytes Relative 9 (*) 12 - 46 %   Lymphs Abs 1.2  0.7 - 4.0 K/uL   Monocytes Relative 6  3 - 12 %   Monocytes Absolute 0.8  0.1 - 1.0 K/uL   Eosinophils Relative 1  0 - 5 %   Eosinophils Absolute 0.1  0.0 - 0.7 K/uL   Basophils Relative 0  0 - 1 %   Basophils Absolute 0.0  0.0 - 0.1 K/uL  I-STAT TROPOININ, ED     Status: None   Collection Time    09/01/13  4:11 PM      Result Value Ref Range   Troponin i, poc 0.00  0.00 - 0.08 ng/mL   Comment 3            Comment: Due to the release kinetics of cTnI,     a negative result within the first hours     of the onset of  symptoms does not rule out     myocardial infarction with certainty.     If myocardial infarction is still suspected,     repeat the test at appropriate intervals.  WOUND CULTURE     Status: None   Collection Time    09/01/13  5:30 PM      Result Value Ref Range   Specimen Description WOUND PERIRECTAL     Special Requests NONE     Gram Stain       Value: NO     WBC NO SQUAMOUS EPITHELIAL CELLS SEEN     MODERATE GRAM POSITIVE COCCI     IN PAIRS FEW GRAM POSITIVE RODS     Performed at [facility]   Culture PENDING     Report Status PENDING    URINALYSIS, ROUTINE W REFLEX MICROSCOPIC     Status: Abnormal   Collection Time    09/01/13  5:35 PM      Result Value Ref Range   Color, Urine YELLOW  YELLOW   APPearance HAZY (*) CLEAR   Specific Gravity, Urine 1.015  1.005 - 1.030   pH 6.5  5.0 - 8.0   Glucose, UA NEGATIVE  NEGATIVE mg/dL   Hgb urine dipstick NEGATIVE  NEGATIVE   Bilirubin Urine NEGATIVE  NEGATIVE   Ketones, ur NEGATIVE  NEGATIVE mg/dL   Protein, ur NEGATIVE  NEGATIVE mg/dL   Urobilinogen, UA 1.0  0.0 - 1.0 mg/dL   Nitrite NEGATIVE  NEGATIVE   Leukocytes, UA TRACE (*) NEGATIVE  URINE RAPID DRUG SCREEN (HOSP PERFORMED)  Status: None   Collection Time    09/01/13  5:35 PM      Result Value Ref Range   Opiates NONE DETECTED  NONE DETECTED   Cocaine NONE DETECTED  NONE DETECTED   Benzodiazepines NONE DETECTED  NONE DETECTED   Amphetamines NONE DETECTED  NONE DETECTED   Tetrahydrocannabinol NONE DETECTED  NONE DETECTED   Barbiturates NONE DETECTED  NONE DETECTED   Comment:            DRUG SCREEN FOR MEDICAL PURPOSES     ONLY.  IF CONFIRMATION IS NEEDED     FOR ANY PURPOSE, NOTIFY LAB     WITHIN 5 DAYS.                LOWEST DETECTABLE LIMITS     FOR URINE DRUG SCREEN     Drug Class       Cutoff (ng/mL)     Amphetamine      1000     Barbiturate      200     Benzodiazepine   211     Tricyclics       941     Opiates          300     Cocaine           300     THC              50  URINE MICROSCOPIC-ADD ON     Status: Abnormal   Collection Time    09/01/13  5:35 PM      Result Value Ref Range   Squamous Epithelial / LPF MANY (*) RARE   WBC, UA 0-2  <3 WBC/hpf   Bacteria, UA FEW (*) RARE   Urine-Other MUCOUS PRESENT    CULTURE, BLOOD (ROUTINE X 2)     Status: None   Collection Time    09/01/13  6:00 PM      Result Value Ref Range   Specimen Description BLOOD ARM LEFT     Special Requests BOTTLES DRAWN AEROBIC AND ANAEROBIC 10CC     Culture  Setup Time       Value: 09/01/2013 23:02     Performed at [facility]   Culture       Value:        BLOOD CULTURE RECEIVED NO GROWTH TO DATE CULTURE WILL BE HELD FOR 5 DAYS BEFORE ISSUING A FINAL NEGATIVE REPORT     Performed at [facility]   Report Status PENDING    POC URINE PREG, ED     Status: None   Collection Time    09/01/13  6:01 PM      Result Value Ref Range   Preg Test, Ur NEGATIVE  NEGATIVE   Comment:            THE SENSITIVITY OF THIS     METHODOLOGY IS >24 mIU/mL  CULTURE, BLOOD (ROUTINE X 2)     Status: None   Collection Time    09/01/13  6:10 PM      Result Value Ref Range   Specimen Description BLOOD ARM LEFT     Special Requests BOTTLES DRAWN AEROBIC ONLY 10CC     Culture  Setup Time       Value: 09/01/2013 23:01     Performed at [facility]   Culture       Value:        BLOOD CULTURE RECEIVED NO  GROWTH TO DATE CULTURE WILL BE HELD FOR 5 DAYS BEFORE ISSUING A FINAL NEGATIVE REPORT     Performed at [facility]   Report Status PENDING    POTASSIUM     Status: Abnormal   Collection Time    09/01/13  6:10 PM      Result Value Ref Range   Potassium 2.9 (*) 3.7 - 5.3 mEq/L   Comment: NO VISIBLE HEMOLYSIS     CRITICAL RESULT CALLED TO, READ BACK BY AND VERIFIED WITH:     Donnie Mesa 1905 09/01/13 D BRADLEY  MAGNESIUM     Status: None   Collection Time    09/01/13  6:10 PM      Result Value Ref Range   Magnesium 1.8  1.5 - 2.5 mg/dL   GLUCOSE, CAPILLARY     Status: Abnormal   Collection Time    09/02/13 12:42 AM      Result Value Ref Range   Glucose-Capillary 110 (*) 70 - 99 mg/dL  COMPREHENSIVE METABOLIC PANEL     Status: Abnormal   Collection Time    09/02/13  5:42 AM      Result Value Ref Range   Sodium 141  137 - 147 mEq/L   Potassium 2.7 (*) 3.7 - 5.3 mEq/L   Comment: CRITICAL RESULT CALLED TO, READ BACK BY AND VERIFIED WITH:     POLITE T RN 09/02/13 0712 COSTELLO B   Chloride 103  96 - 112 mEq/L   CO2 24  19 - 32 mEq/L   Glucose, Bld 97  70 - 99 mg/dL   BUN <3 (*) 6 - 23 mg/dL   Creatinine, Ser 0.28 (*) 0.50 - 1.10 mg/dL   Calcium 8.6  8.4 - 10.5 mg/dL   Total Protein 6.2  6.0 - 8.3 g/dL   Albumin 2.6 (*) 3.5 - 5.2 g/dL   AST 15  0 - 37 U/L   ALT 12  0 - 35 U/L   Alkaline Phosphatase 108  39 - 117 U/L   Total Bilirubin 0.9  0.3 - 1.2 mg/dL   GFR calc non Af Amer >90  >90 mL/min   GFR calc Af Amer >90  >90 mL/min   Comment: (NOTE)     The eGFR has been calculated using the CKD EPI equation.     This calculation has not been validated in all clinical situations.     eGFR's persistently <90 mL/min signify possible Chronic Kidney     Disease.   Anion gap 14  5 - 15  CBC WITH DIFFERENTIAL     Status: Abnormal   Collection Time    09/02/13  5:42 AM      Result Value Ref Range   WBC 14.8 (*) 4.0 - 10.5 K/uL   Comment: REPEATED TO VERIFY   RBC 3.96  3.87 - 5.11 MIL/uL   Hemoglobin 12.4  12.0 - 15.0 g/dL   Comment: CONSISTENT WITH PREVIOUS RESULT   HCT 36.7  36.0 - 46.0 %   MCV 92.7  78.0 - 100.0 fL   Comment: CONSISTENT WITH PREVIOUS RESULT   MCH 31.3  26.0 - 34.0 pg   MCHC 33.8  30.0 - 36.0 g/dL   RDW 12.7  11.5 - 15.5 %   Platelets 568 (*) 150 - 400 K/uL   Comment: REPEATED TO VERIFY   Neutrophils Relative % 73  43 - 77 %   Neutro Abs 10.8 (*) 1.7 - 7.7 K/uL   Lymphocytes Relative  16  12 - 46 %   Lymphs Abs 2.3  0.7 - 4.0 K/uL   Monocytes Relative 9  3 - 12 %   Monocytes Absolute 1.4 (*) 0.1  - 1.0 K/uL   Eosinophils Relative 2  0 - 5 %   Eosinophils Absolute 0.3  0.0 - 0.7 K/uL   Basophils Relative 0  0 - 1 %   Basophils Absolute 0.0  0.0 - 0.1 K/uL  GLUCOSE, CAPILLARY     Status: None   Collection Time    09/02/13  6:25 AM      Result Value Ref Range   Glucose-Capillary 98  70 - 99 mg/dL  GLUCOSE, CAPILLARY     Status: Abnormal   Collection Time    09/02/13  7:45 AM      Result Value Ref Range   Glucose-Capillary 128 (*) 70 - 99 mg/dL    Imaging / Studies: Dg Chest 2 View  09/01/2013   CLINICAL DATA:  Weakness  EXAM: CHEST  2 VIEW  COMPARISON:  07/03/2013  FINDINGS: The heart size and mediastinal contours are within normal limits. Both lungs are clear. The visualized skeletal structures are unremarkable.  IMPRESSION: No active cardiopulmonary disease.   Electronically Signed   By: Daryll Brod M.D.   On: 09/01/2013 16:25   Ct Head Wo Contrast  09/01/2013   CLINICAL DATA:  Altered mental status ; multiple sclerosis  EXAM: CT HEAD WITHOUT CONTRAST  TECHNIQUE: Contiguous axial images were obtained from the base of the skull through the vertex without intravenous contrast.  COMPARISON:  Brain MRI July 03, 2013 and brain CT July 03, 2013  FINDINGS: There is mild generalized atrophy, stable. There is no demonstrable mass, hemorrhage, extra-axial fluid collection, or midline shift. Widespread decreased attenuation throughout the supratentorial white matter is consistent with known multiple sclerosis. No acute infarct is appreciable on this study. There is no obvious is new gray-white compartment lesion. Bony calvarium appears intact. The mastoid air cells are clear.  IMPRESSION: Atrophy with widespread decreased attenuation in the supratentorial white matter consistent with extensive changes of demyelination. No hemorrhage or well-defined mass is seen. There is no extra-axial fluid. Note that intravenous contrast is necessary to assess for plaque activity.   Electronically Signed   By:  Lowella Grip M.D.   On: 09/01/2013 16:41   Ct Abdomen Pelvis W Contrast  09/01/2013   CLINICAL DATA:  Sacral ulcer ; possible abscess  EXAM: CT ABDOMEN AND PELVIS WITH CONTRAST  TECHNIQUE: Multidetector CT imaging of the abdomen and pelvis was performed using the standard protocol following bolus administration of intravenous contrast.  CONTRAST:  124m OMNIPAQUE IOHEXOL 300 MG/ML  SOLN  COMPARISON:  None.  FINDINGS: There is increased density within the subcutaneous and deeper soft tissues at the level of the lower sacrum and coccyx. There is no discrete fluid collection. No definite sinus tract communicating with the anus is demonstrated. Within the pelvis proper the urinary bladder contains a Foley catheter. The uterus and adnexal structures are grossly normal. The sigmoid colon and rectum contain a moderate amount of stool and gas. There is no free pelvic fluid.  The liver, gallbladder, pancreas, spleen, adrenal glands, and kidneys exhibit no acute abnormalities. There is mild fullness of the pancreatic head without evidence of a mass or inflammation. There is an 8 mm diameter hypodensity in the midpole of the left kidney most compatible with a cyst. The abdominal aorta exhibits normal caliber. The stomach and small and  large bowel exhibit no acute abnormality. The lung bases are clear. The lumbar spine and bony pelvis exhibit no acute abnormalities.  IMPRESSION: 1. There are inflammatory changes consistent with cellulitis in the soft tissues over the tip of the sacrum and coccyx. No abscess is demonstrated. 2. No acute intra-abdominal or pelvic abnormality is demonstrated.   Electronically Signed   By: David  Martinique   On: 09/01/2013 19:49    Medications / Allergies:  Scheduled Meds: . amLODipine  5 mg Oral Daily  . aspirin EC  81 mg Oral Daily  . baclofen  5 mg Oral TID  . citalopram  10 mg Oral Daily  . collagenase   Topical Daily  . heparin  5,000 Units Subcutaneous 3 times per day  .  insulin aspart  0-9 Units Subcutaneous 6 times per day  . metoprolol tartrate  25 mg Oral BID  . piperacillin-tazobactam (ZOSYN)  IV  3.375 g Intravenous Q8H  . potassium chloride  10 mEq Intravenous Q1 Hr x 6  . vancomycin  750 mg Intravenous Q12H   Continuous Infusions: . sodium chloride Stopped (09/01/13 2230)  . 0.9 % NaCl with KCl 20 mEq / L 100 mL/hr at 09/01/13 2229   PRN Meds:.meclizine  Antibiotics: Anti-infectives   Start     Dose/Rate Route Frequency Ordered Stop   09/02/13 0600  vancomycin (VANCOCIN) IVPB 750 mg/150 ml premix     750 mg 150 mL/hr over 60 Minutes Intravenous Every 12 hours 09/01/13 1753     09/01/13 2359  piperacillin-tazobactam (ZOSYN) IVPB 3.375 g     3.375 g 12.5 mL/hr over 240 Minutes Intravenous Every 8 hours 09/01/13 1753     09/01/13 1715  piperacillin-tazobactam (ZOSYN) IVPB 3.375 g     3.375 g 100 mL/hr over 30 Minutes Intravenous  Once 09/01/13 1703 09/01/13 1859   09/01/13 1715  vancomycin (VANCOCIN) IVPB 1000 mg/200 mL premix     1,000 mg 200 mL/hr over 60 Minutes Intravenous  Once 09/01/13 1703 09/01/13 2000      Assessment/Plan MS  Dementia/encephaopathy  DM II  HTN  Leukocytosis  Stage III sacral pressure ulcer   No significant abscess. Will start hydrotherapy to the 1 small opening at the base of the decub as well as santyl.  If no improvement, then we will consider surgical debridement to this area only.  Maximize nutrition, turn the patient frequently, consider a air mattress.  Agree with zosyn, await cultures.  Will follow.     Erby Pian, [facility] Pager [phone]) Office [phone]  09/02/2013 10:07 AM

## 2013-09-02 NOTE — Evaluation (Signed)
Clinical/Bedside Swallow Evaluation Patient Details  Name: [name] [name] MRN: [id number] Date of Birth: 1970/06/26  Today's Date: 09/02/2013 Time: 1340-1400 SLP Time Calculation (min): 20 min  Past Medical History:  Past Medical History  Diagnosis Date  . Multiple sclerosis   . HYPERTENSION 09/30/2006  . TIA (transient ischemic attack)   . TOBACCO USER 09/30/2006    Qualifier: Diagnosis of  By: Delrae AlfredMulberry MD, [name]    . Multiple sclerosis, relapsing-remitting 05/29/2012  . DYSLIPIDEMIA 09/30/2006    Qualifier: Diagnosis of  By: Delrae AlfredMulberry MD, [name]    . BENIGN POSITIONAL VERTIGO 01/27/2010    Qualifier: Diagnosis of  By: Delrae AlfredMulberry MD, [name]    . Depression   . DIABETES MELLITUS, TYPE II 09/30/2006    Qualifier: Diagnosis of  By: Delrae AlfredMulberry MD, [name]    . Neuromuscular disorder     MS  . Stroke     pt denies this hx on 09/02/2013   Past Surgical History:  Past Surgical History  Procedure Laterality Date  . No past surgeries     HPI:  This is a 43 y.o. year old female with significant past medical history of progressive debilitating MS with secondary dementia, type 2 DM, depression presenting with encephalopathy, sepsis, sacral ulcer cellulitis. Patient with worsening progressive confusion over the past week and has become progressively bedridden. Orders received for bedside swallow evaluation.     Assessment / Plan / Recommendation Clinical Impression  Bedside swallow Evaluation completed. Patient presents with moderate oral dysphagia which is impacted but her current cognition.  Patient's swallow is characterized by poor awareness of bolus with cues to continue to attend to mastication and manage pocketing.  Patient demonstrated no overt s/s of pharyngeal aspiration.  Recommend initiation of Dys 2 textures and thin liquids with full supervision to assist with self-feeding as well as to ensure carryover of recommend safe swallow strategies.  SLP to follow acutely.       Aspiration Risk  Mild    Diet Recommendation Dysphagia 2 (Fine chop);Thin liquid   Liquid Administration via: Cup;Straw Medication Administration: Whole meds with puree Supervision: Patient able to self feed;Full supervision/cueing for compensatory strategies Compensations: Slow rate;Small sips/bites;Check for pocketing Postural Changes and/or Swallow Maneuvers: Seated upright 90 degrees    Other  Recommendations Oral Care Recommendations: Oral care BID   Follow Up Recommendations  Skilled Nursing facility;24 hour supervision/assistance    Frequency and Duration min 2x/week  1 week   Pertinent Vitals/Pain None    SLP Swallow Goals  See care plan for details    Swallow Study Prior Functional Status   Regular and thin     General Date of Onset: 09/01/13 HPI: This is a 43 y.o. year old female with significant past medical history of progressive debilitating MS with secondary dementia, type 2 DM, depression presenting with encephalopathy, sepsis, sacral ulcer cellulitis. Patient with worsening progressive confusion over the past week and has become progressively bedridden. Orders received for bedside swallow evaluation.   Type of Study: Bedside swallow evaluation Previous Swallow Assessment: regular and thin from last admission  Diet Prior to this Study: Regular Temperature Spikes Noted: No Respiratory Status: Room air History of Recent Intubation: No Behavior/Cognition: Cooperative;Pleasant mood;Confused;Distractible;Requires cueing Oral Cavity - Dentition: Adequate natural dentition Self-Feeding Abilities: Able to feed self;Needs assist;Needs set up Patient Positioning: Upright in bed Baseline Vocal Quality: Clear;Low vocal intensity Volitional Cough: Weak Volitional Swallow: Able to elicit    Oral/Motor/Sensory Function Overall Oral Motor/Sensory Function: Appears within functional limits for  tasks assessed (grossly WFL)   Ice Chips Ice chips: Within functional  limits Presentation: Spoon   Thin Liquid Thin Liquid: Within functional limits Presentation: Cup;Straw;Self Fed    Nectar Thick Nectar Thick Liquid: Not tested   Honey Thick Honey Thick Liquid: Not tested   Puree Puree: Within functional limits Presentation: Spoon   Solid   GO    Solid: Impaired Presentation: Self Fed Oral Phase Impairments: Reduced lingual movement/coordination;Poor awareness of bolus;Impaired mastication Oral Phase Functional Implications: Oral residue Pharyngeal Phase Impairments: Multiple swallows      [name] [name]., CCC-SLP [phone]  [name] [name] 09/02/2013,3:14 PM

## 2013-09-02 NOTE — Progress Notes (Signed)
Agree with above try local therapy

## 2013-09-02 NOTE — Care Management Note (Signed)
    Page 1 of 1   09/02/2013     4:10:53 PM CARE MANAGEMENT NOTE 09/02/2013  Patient:  [name] [name], [name] [name]   Account Number:  [id number]  Date Initiated:  09/02/2013  Documentation initiated by:  Letha Cape  Subjective/Objective Assessment:   dx AMS, stage 3 sacral ulcer     Action/Plan:   pt for hydrotherapy   Anticipated DC Date:  09/04/2013   Anticipated DC Plan:        DC Planning Services  CM consult      Choice offered to / List presented to:             Status of service:  In process, will continue to follow Medicare Important Message given?   (If response is "NO", the following Medicare IM given date fields will be blank) Date Medicare IM given:   Medicare IM given by:   Date Additional Medicare IM given:   Additional Medicare IM given by:    Discharge Disposition:    Per UR Regulation:  Reviewed for med. necessity/level of care/duration of stay  If discussed at Long Length of Stay Meetings, dates discussed:    Comments:

## 2013-09-03 DIAGNOSIS — L0231 Cutaneous abscess of buttock: Secondary | ICD-10-CM

## 2013-09-03 DIAGNOSIS — L03317 Cellulitis of buttock: Secondary | ICD-10-CM

## 2013-09-03 DIAGNOSIS — G9340 Encephalopathy, unspecified: Secondary | ICD-10-CM

## 2013-09-03 LAB — GLUCOSE, CAPILLARY
GLUCOSE-CAPILLARY: 103 mg/dL — AB (ref 70–99)
GLUCOSE-CAPILLARY: 107 mg/dL — AB (ref 70–99)
GLUCOSE-CAPILLARY: 115 mg/dL — AB (ref 70–99)
GLUCOSE-CAPILLARY: 89 mg/dL (ref 70–99)
GLUCOSE-CAPILLARY: 97 mg/dL (ref 70–99)
Glucose-Capillary: 118 mg/dL — ABNORMAL HIGH (ref 70–99)
Glucose-Capillary: 112 mg/dL — ABNORMAL HIGH (ref 70–99)
Glucose-Capillary: 121 mg/dL — ABNORMAL HIGH (ref 70–99)

## 2013-09-03 LAB — COMPREHENSIVE METABOLIC PANEL
ALBUMIN: 2.5 g/dL — AB (ref 3.5–5.2)
ALT: 10 U/L (ref 0–35)
AST: 12 U/L (ref 0–37)
Alkaline Phosphatase: 100 U/L (ref 39–117)
Anion gap: 12 (ref 5–15)
BUN: 3 mg/dL — ABNORMAL LOW (ref 6–23)
CALCIUM: 8.8 mg/dL (ref 8.4–10.5)
CO2: 24 mEq/L (ref 19–32)
CREATININE: 0.34 mg/dL — AB (ref 0.50–1.10)
Chloride: 105 mEq/L (ref 96–112)
GFR calc Af Amer: >90 mL/min (ref >90)
Glucose, Bld: 98 mg/dL (ref 70–99)
Potassium: 3.5 mEq/L — ABNORMAL LOW (ref 3.7–5.3)
Sodium: 141 mEq/L (ref 137–147)
Total Bilirubin: 1 mg/dL (ref 0.3–1.2)
Total Protein: 5.9 g/dL — ABNORMAL LOW (ref 6.0–8.3)

## 2013-09-03 LAB — CBC WITH DIFFERENTIAL/PLATELET
Basophils Absolute: 0 10*3/uL (ref 0.0–0.1)
Basophils Relative: 0 % (ref 0–1)
Eosinophils Absolute: 0.2 10*3/uL (ref 0.0–0.7)
Eosinophils Relative: 2 % (ref 0–5)
HEMATOCRIT: 32.6 % — AB (ref 36.0–46.0)
Hemoglobin: 10.9 g/dL — ABNORMAL LOW (ref 12.0–15.0)
LYMPHS PCT: 17 % (ref 12–46)
Lymphs Abs: 2 10*3/uL (ref 0.7–4.0)
MCH: 31.6 pg (ref 26.0–34.0)
MCHC: 33.4 g/dL (ref 30.0–36.0)
MCV: 94.5 fL (ref 78.0–100.0)
Monocytes Absolute: 0.9 10*3/uL (ref 0.1–1.0)
Monocytes Relative: 8 % (ref 3–12)
NEUTROS ABS: 8.2 10*3/uL — AB (ref 1.7–7.7)
Neutrophils Relative %: 73 % (ref 43–77)
PLATELETS: 552 10*3/uL — AB (ref 150–400)
RBC: 3.45 MIL/uL — AB (ref 3.87–5.11)
RDW: 12.9 % (ref 11.5–15.5)
WBC: 11.3 10*3/uL — AB (ref 4.0–10.5)

## 2013-09-03 LAB — VANCOMYCIN, TROUGH: VANCOMYCIN TR: 7.4 ug/mL — AB (ref 10.0–20.0)

## 2013-09-03 MED ORDER — CETYLPYRIDINIUM CHLORIDE 0.05 % MT LIQD
7.0000 mL | Freq: Two times a day (BID) | OROMUCOSAL | Status: DC
  Administered 2013-09-04 – 2013-09-07 (×7): 7 mL via OROMUCOSAL

## 2013-09-03 MED ORDER — VANCOMYCIN HCL IN DEXTROSE 1-5 GM/200ML-% IV SOLN
1000.0000 mg | Freq: Two times a day (BID) | INTRAVENOUS | Status: DC
  Administered 2013-09-04: 1000 mg via INTRAVENOUS
  Filled 2013-09-03 (×2): qty 200

## 2013-09-03 NOTE — Progress Notes (Signed)
[facility] PROGRESS NOTE  [name] [name] GNF:[id number] DOB: 1970/03/20 DOA: 09/01/2013 PCP: Jeanann LewandowskyJEGEDE, [name], MD  Assessment/Plan: 1. Sepsis due to Wound infection/sacral decubitus ulcers -COntinue hydrotherapy, Wound care -Empiric Abx-Vanc/Zosyn, transition to Po if cultures stay negative -may need Debridement if not improving -FU Blood cultures negative thus far  2. Metabolic Encephalopathy  -Encephalopathy likely multifactorial in the setting of sepsis and baseline dementia/multiple sclerosis.  -improving, cont vanc and zosyn  -back to baseline  3-dementia/multiple sclerosis  -progressive disease  -Continue antispasticity meds  -CSW consult, will need SNF placement  4-HTN  -stable  -cont coreg   5-DM  -SSI, A1C 5.5  6. Hypokalemia -replace  DVT proph: Hep SQ  Code Status: Full COde Family Communication: none at bedside Disposition Plan: will need SNF   Consultants:  CCS  Antibiotics:  Vanc/Zosyn  HPI/Subjective: No complaints  Objective: Filed Vitals:   09/03/13 1039  BP: 118/84  Pulse: 109  Temp:   Resp:     Intake/Output Summary (Last 24 hours) at 09/03/13 1144 Last data filed at 09/03/13 1007  Gross per 24 hour  Intake 205.83 ml  Output   1250 ml  Net -1044.17 ml   Filed Weights   09/02/13 0458 09/03/13 0420 09/03/13 0500  Weight: 53.8 kg (118 lb 9.7 oz) 52.2 kg (115 lb 1.3 oz) 54.5 kg (120 lb 2.4 oz)    Exam:   General:  AAOx to self, place, chronically ill appearing  Cardiovascular: S1S2/RRR  Respiratory: CTAB  Abdomen: soft, Nt, BS present  Musculoskeletal: no edema c/c   Decubitus ulcer with dressing  Data Reviewed: Basic Metabolic Panel:  Recent Labs Lab 09/01/13 1530 09/01/13 1810 09/02/13 0542 09/03/13 0601  NA 138  --  141 141  K 7.1* 2.9* 2.7* 3.5*  CL 103  --  103 105  CO2 24  --  24 24  GLUCOSE 117*  --  97 98  BUN 4*  --  <3* 3*  CREATININE 0.21*  --  0.28* 0.34*  CALCIUM 8.2*  --  8.6  8.8  MG  --  1.8  --   --    Liver Function Tests:  Recent Labs Lab 09/01/13 1530 09/02/13 0542 09/03/13 0601  AST 102* 15 12  ALT 24 12 10   ALKPHOS 92 108 100  BILITOT 0.8 0.9 1.0  PROT 7.1 6.2 5.9*  ALBUMIN 2.7* 2.6* 2.5*   No results found for this basename: LIPASE, AMYLASE,  in the last 168 hours No results found for this basename: AMMONIA,  in the last 168 hours CBC:  Recent Labs Lab 09/01/13 1530 09/02/13 0542 09/03/13 0601  WBC 12.8* 14.8* 11.3*  NEUTROABS 10.8* 10.8* 8.2*  HGB 12.0 12.4 10.9*  HCT 34.8* 36.7 32.6*  MCV 92.6 92.7 94.5  PLT 362 568* 552*   Cardiac Enzymes: No results found for this basename: CKTOTAL, CKMB, CKMBINDEX, TROPONINI,  in the last 168 hours BNP (last 3 results) No results found for this basename: PROBNP,  in the last 8760 hours CBG:  Recent Labs Lab 09/02/13 2057 09/03/13 0009 09/03/13 0045 09/03/13 0414 09/03/13 0730  GLUCAP 101* 89 115* 97 118*    Recent Results (from the past 240 hour(s))  WOUND CULTURE     Status: None   Collection Time    09/01/13  5:30 PM      Result Value Ref Range Status   Specimen Description WOUND PERIRECTAL   Final   Special Requests NONE   Final   Gram  Stain     Final   Value: NO     WBC NO SQUAMOUS EPITHELIAL CELLS SEEN     MODERATE GRAM POSITIVE COCCI     IN PAIRS FEW GRAM POSITIVE RODS     Performed at [facility]   Culture     Final   Value: Culture reincubated for better growth     Performed at [facility]   Report Status PENDING   Incomplete  URINE CULTURE     Status: None   Collection Time    09/01/13  5:35 PM      Result Value Ref Range Status   Specimen Description URINE, CATHETERIZED   Final   Special Requests NONE   Final   Culture  Setup Time     Final   Value: 09/01/2013 23:22     Performed at [facility] Count     Final   Value: 40,000 COLONIES/ML     Performed at [facility]   Culture     Final   Value: Multiple  bacterial morphotypes present, none predominant. Suggest appropriate recollection if clinically indicated.     Performed at [facility]   Report Status 09/02/2013 FINAL   Final  CULTURE, BLOOD (ROUTINE X 2)     Status: None   Collection Time    09/01/13  6:00 PM      Result Value Ref Range Status   Specimen Description BLOOD ARM LEFT   Final   Special Requests BOTTLES DRAWN AEROBIC AND ANAEROBIC 10CC   Final   Culture  Setup Time     Final   Value: 09/01/2013 23:02     Performed at [facility]   Culture     Final   Value:        BLOOD CULTURE RECEIVED NO GROWTH TO DATE CULTURE WILL BE HELD FOR 5 DAYS BEFORE ISSUING A FINAL NEGATIVE REPORT     Performed at [facility]   Report Status PENDING   Incomplete  CULTURE, BLOOD (ROUTINE X 2)     Status: None   Collection Time    09/01/13  6:10 PM      Result Value Ref Range Status   Specimen Description BLOOD ARM LEFT   Final   Special Requests BOTTLES DRAWN AEROBIC ONLY 10CC   Final   Culture  Setup Time     Final   Value: 09/01/2013 23:01     Performed at [facility]   Culture     Final   Value:        BLOOD CULTURE RECEIVED NO GROWTH TO DATE CULTURE WILL BE HELD FOR 5 DAYS BEFORE ISSUING A FINAL NEGATIVE REPORT     Performed at [facility]   Report Status PENDING   Incomplete     Studies: Dg Chest 2 View  09/01/2013   CLINICAL DATA:  Weakness  EXAM: CHEST  2 VIEW  COMPARISON:  07/03/2013  FINDINGS: The heart size and mediastinal contours are within normal limits. Both lungs are clear. The visualized skeletal structures are unremarkable.  IMPRESSION: No active cardiopulmonary disease.   Electronically Signed   By: Ruel Favors M.D.   On: 09/01/2013 16:25   Ct Head Wo Contrast  09/01/2013   CLINICAL DATA:  Altered mental status ; multiple sclerosis  EXAM: CT HEAD WITHOUT CONTRAST  TECHNIQUE: Contiguous axial images were obtained from the base of the skull through the vertex  without intravenous  contrast.  COMPARISON:  Brain MRI July 03, 2013 and brain CT July 03, 2013  FINDINGS: There is mild generalized atrophy, stable. There is no demonstrable mass, hemorrhage, extra-axial fluid collection, or midline shift. Widespread decreased attenuation throughout the supratentorial white matter is consistent with known multiple sclerosis. No acute infarct is appreciable on this study. There is no obvious is new gray-white compartment lesion. Bony calvarium appears intact. The mastoid air cells are clear.  IMPRESSION: Atrophy with widespread decreased attenuation in the supratentorial white matter consistent with extensive changes of demyelination. No hemorrhage or well-defined mass is seen. There is no extra-axial fluid. Note that intravenous contrast is necessary to assess for plaque activity.   Electronically Signed   By: Bretta Bang M.D.   On: 09/01/2013 16:41   Ct Abdomen Pelvis W Contrast  09/01/2013   CLINICAL DATA:  Sacral ulcer ; possible abscess  EXAM: CT ABDOMEN AND PELVIS WITH CONTRAST  TECHNIQUE: Multidetector CT imaging of the abdomen and pelvis was performed using the standard protocol following bolus administration of intravenous contrast.  CONTRAST:  OMNIPAQUE IOHEXOL 300 MG/ML  SOLN  COMPARISON:  None.  FINDINGS: There is increased density within the subcutaneous and deeper soft tissues at the level of the lower sacrum and coccyx. There is no discrete fluid collection. No definite sinus tract communicating with the anus is demonstrated. Within the pelvis proper the urinary bladder contains a Foley catheter. The uterus and adnexal structures are grossly normal. The sigmoid colon and rectum contain a moderate amount of stool and gas. There is no free pelvic fluid.  The liver, gallbladder, pancreas, spleen, adrenal glands, and kidneys exhibit no acute abnormalities. There is mild fullness of the pancreatic head without evidence of a mass or inflammation. There is an 8 mm diameter  hypodensity in the midpole of the left kidney most compatible with a cyst. The abdominal aorta exhibits normal caliber. The stomach and small and large bowel exhibit no acute abnormality. The lung bases are clear. The lumbar spine and bony pelvis exhibit no acute abnormalities.  IMPRESSION: 1. There are inflammatory changes consistent with cellulitis in the soft tissues over the tip of the sacrum and coccyx. No abscess is demonstrated. 2. No acute intra-abdominal or pelvic abnormality is demonstrated.   Electronically Signed   By: David  [place]   On: 09/01/2013 19:49    Scheduled Meds: . amLODipine  5 mg Oral Daily  . aspirin EC  81 mg Oral Daily  . baclofen  5 mg Oral TID  . citalopram  10 mg Oral Daily  . collagenase   Topical Daily  . heparin  5,000 Units Subcutaneous 3 times per day  . insulin aspart  0-9 Units Subcutaneous 6 times per day  . metoprolol tartrate  25 mg Oral BID  . piperacillin-tazobactam (ZOSYN)  IV  3.375 g Intravenous Q8H  . potassium chloride  40 mEq Oral Daily  . vancomycin  750 mg Intravenous Q12H   Continuous Infusions: . 0.9 % NaCl with KCl 20 mEq / L 50 mL/hr (09/03/13 0805)   Antibiotics Given (last 72 hours)   Date/Time Action Medication Dose Rate   09/02/13 0546 Given   vancomycin (VANCOCIN) IVPB 750 mg/150 ml premix 750 mg 150 mL/hr   09/02/13 0858 Given   piperacillin-tazobactam (ZOSYN) IVPB 3.375 g 3.375 g 12.5 mL/hr   09/02/13 1548 Given   piperacillin-tazobactam (ZOSYN) IVPB 3.375 g 3.375 g 12.5 mL/hr   09/02/13 1815 Given   vancomycin (  VANCOCIN) IVPB 750 mg/150 ml premix 750 mg 150 mL/hr   09/03/13 0057 Given   piperacillin-tazobactam (ZOSYN) IVPB 3.375 g 3.375 g 12.5 mL/hr   09/03/13 0558 Given   vancomycin (VANCOCIN) IVPB 750 mg/150 ml premix 750 mg 150 mL/hr   09/03/13 0803 Given   piperacillin-tazobactam (ZOSYN) IVPB 3.375 g 3.375 g 12.5 mL/hr      Active Problems:   Cellulitis   Encephalopathy    Time spent:     [facility]  [facility] Pager [phone]. If 7PM-7AM, please contact night-coverage at [email or web address], password [facility] 09/03/2013, 11:44 AM  LOS: 2 days

## 2013-09-03 NOTE — Progress Notes (Signed)
Physical Therapy Wound Treatment Patient Details  Name: [name] [name] MRN: [id number] Date of Birth: March 21, 1970  Today's Date: 09/03/2013 Time: 4709-6283 Time Calculation (min): 40 min  Subjective  Subjective: OHH, it hurts....stop Patient and Family Stated Goals: pt did not participate, but agreed needed to get healed up. Date of Onset:  (for a while leading up to admission)  Pain Score:    Wound Assessment  Pressure Ulcer 09/01/13 Unstageable - Full thickness tissue loss in which the base of the ulcer is covered by slough (yellow, tan, gray, green or brown) and/or eschar (tan, brown or black) in the wound bed. (Active)  Dressing Type ABD;Gauze (Comment);Barrier Film (skin prep) 09/03/2013 10:47 AM  Dressing Changed;Clean;Dry;Intact 09/03/2013 10:47 AM  Dressing Change Frequency Daily 09/03/2013 10:47 AM  State of Healing Eschar 09/03/2013 10:47 AM  Site / Wound Assessment Pink;Brown;Black;Yellow 09/03/2013 10:47 AM  % Wound base Red or Granulating 10% 09/03/2013 10:47 AM  % Wound base Yellow 10% 09/03/2013 10:47 AM  % Wound base Black 80% 09/03/2013 10:47 AM  Peri-wound Assessment Intact;Pink 09/03/2013 10:47 AM  Wound Length (cm) 10 cm 09/02/2013  3:00 PM  Wound Width (cm) 15 cm 09/02/2013  3:00 PM  Wound Depth (cm) 1 cm 09/02/2013  3:00 PM  Margins Unattached edges (unapproximated) 09/03/2013 10:47 AM  Drainage Amount Scant 09/03/2013 12:00 AM  Drainage Description Serous;Serosanguineous;Purulent;Odor;Other (Comment) 09/03/2013 10:47 AM  Treatment Cleansed;Hydrotherapy (Pulse lavage);Debridement (Selective);Other (Comment) 09/03/2013 10:47 AM     Pressure Ulcer 09/01/13 Stage II -  Partial thickness loss of dermis presenting as a shallow open ulcer with a red, pink wound bed without slough. (Active)  Dressing Type Foam 09/03/2013  8:09 AM  Dressing Changed;Clean;Dry;Intact 09/03/2013  8:09 AM  Site / Wound Assessment Yellow;Pink 09/02/2013  7:48 AM  % Wound base Red or Granulating 75%  09/02/2013  7:48 AM  % Wound base Yellow 25% 09/02/2013  7:48 AM  Peri-wound Assessment Maceration 09/01/2013 11:45 AM  Wound Length (cm) 1.2 cm 09/02/2013  7:48 AM  Wound Width (cm) 1 cm 09/02/2013  7:48 AM  Wound Depth (cm) 0 cm 09/02/2013  7:48 AM  Tunneling (cm) 0 09/02/2013  7:48 AM  Undermining (cm) 0 09/02/2013  7:48 AM  Margins Unattached edges (unapproximated) 09/02/2013  7:48 AM  Drainage Amount None 09/02/2013  7:48 AM  Drainage Description Serosanguineous 09/01/2013 11:45 AM     Pressure Ulcer 09/01/13 Deep Tissue Injury - Purple or maroon localized area of discolored intact skin or blood-filled blister due to damage of underlying soft tissue from pressure and/or shear. (Active)  Dressing Type Foam 09/03/2013  8:09 AM  Dressing Changed;Clean;Dry;Intact 09/02/2013  7:48 AM  Site / Wound Assessment Dressing in place / Unable to assess 09/03/2013  8:09 AM  Peri-wound Assessment Intact 09/02/2013  7:48 AM  Wound Length (cm) 2.5 cm 09/02/2013  7:48 AM  Wound Width (cm) 3 cm 09/02/2013  7:48 AM  Wound Depth (cm) 0 cm 09/02/2013  7:48 AM  Tunneling (cm) 0 09/02/2013  7:48 AM  Undermining (cm) 0 09/02/2013  7:48 AM  Margins Attached edges (approximated) 09/02/2013  7:48 AM  Drainage Amount None 09/02/2013  7:48 AM     Pressure Ulcer 09/01/13 Stage II -  Partial thickness loss of dermis presenting as a shallow open ulcer with a red, pink wound bed without slough. (Active)  Dressing Type Foam 09/03/2013  8:09 AM  Dressing Changed 09/03/2013 10:46 AM  State of Healing Early/partial granulation 09/01/2013 11:45 AM  Site / Wound  Assessment Yellow 09/02/2013  7:48 AM  Peri-wound Assessment Intact 09/02/2013  7:48 AM  Wound Length (cm) 0.6 cm 09/02/2013  7:48 AM  Wound Width (cm) 0.6 cm 09/02/2013  7:48 AM  Wound Depth (cm) 0 cm 09/02/2013  7:48 AM  Tunneling (cm) 0 09/02/2013  7:48 AM  Undermining (cm) 0 09/02/2013  7:48 AM  Margins Unattached edges (unapproximated) 09/02/2013  7:48 AM  Drainage Amount None  09/02/2013  7:48 AM  Drainage Description Serosanguineous 09/01/2013 11:45 AM     Pressure Ulcer 09/01/13 Stage II -  Partial thickness loss of dermis presenting as a shallow open ulcer with a red, pink wound bed without slough. (Active)  Dressing Type Foam 09/03/2013  8:09 AM  Dressing Changed 09/03/2013 10:46 AM  State of Healing Early/partial granulation 09/01/2013 11:45 AM  Site / Wound Assessment Pink 09/02/2013  7:48 AM  % Wound base Red or Granulating 10% 09/01/2013 11:45 AM  Peri-wound Assessment Intact 09/02/2013  7:48 AM  Wound Length (cm) 1 cm 09/02/2013  7:48 AM  Wound Width (cm) 0.8 cm 09/02/2013  7:48 AM  Wound Depth (cm) 0 cm 09/02/2013  7:48 AM  Tunneling (cm) 0 09/02/2013  7:48 AM  Undermining (cm) 0 09/02/2013  7:48 AM  Margins Unattached edges (unapproximated) 09/02/2013  7:48 AM  Drainage Amount None 09/02/2013  7:48 AM  Drainage Description Sanguineous;Serosanguineous 09/01/2013 11:45 AM     Pressure Ulcer 09/02/13 Stage II -  Partial thickness loss of dermis presenting as a shallow open ulcer with a red, pink wound bed without slough. (Active)  Dressing Type Foam 09/03/2013  8:09 AM  Dressing Changed 09/03/2013 10:46 AM  Site / Wound Assessment Dry;Clean 09/02/2013  7:48 AM  Peri-wound Assessment Intact 09/02/2013  7:48 AM  Wound Length (cm) 0.8 cm 09/02/2013  7:48 AM  Wound Width (cm) 1.4 cm 09/02/2013  7:48 AM  Wound Depth (cm) 0 cm 09/02/2013  7:48 AM  Tunneling (cm) 0 09/02/2013  7:48 AM  Undermining (cm) 0 09/02/2013  7:48 AM  Margins Unattached edges (unapproximated) 09/02/2013  7:48 AM  Drainage Amount None 09/02/2013  7:48 AM     Wound / Incision (Open or Dehisced) 09/02/13 Other (Comment) Neck Mid;Lower skin tear (Active)  Dressing Type None 09/03/2013  8:09 AM  Site / Wound Assessment Pink 09/03/2013  8:09 AM  % Wound base Red or Granulating 100% 09/03/2013 12:00 AM  Peri-wound Assessment Intact 09/03/2013 12:00 AM  Wound Length (cm) 0.4 cm 09/02/2013  7:48 AM  Wound Width (cm)  1.5 cm 09/02/2013  7:48 AM  Wound Depth (cm) 0 cm 09/02/2013  7:48 AM  Tunneling (cm) 0 09/02/2013  7:48 AM  Undermining (cm) 0 09/02/2013  7:48 AM  Margins Attached edges (approximated) 09/03/2013 12:00 AM  Closure None 09/03/2013 12:00 AM  Drainage Amount None 09/03/2013 12:00 AM   Hydrotherapy Pulsed lavage therapy - wound location: sacrum Pulsed Lavage with Suction (psi): 8 psi (to 12) Pulsed Lavage with Suction - Normal Saline Used: 1000 mL Pulsed Lavage Tip: Tip with splash shield Selective Debridement Selective Debridement - Location: sacrum Selective Debridement - Tools Used: Scalpel;Forceps Selective Debridement - Tissue Removed: eschar (and cross-hatched the entire area.)   Wound Assessment and Plan  Wound Therapy - Assess/Plan/Recommendations Wound Therapy - Clinical Statement: The majority of the top leathery eschar layer has been removed with an unknown depth of non viable  tissue beneath.  Will continue PLS and selective debridement at this point to attain healthy granulation and adipose. Wound  Therapy - Functional Problem List: MS,  decreased ability to mobilize Factors Delaying/Impairing Wound Healing: Immobility;Infection - systemic/local Hydrotherapy Plan: Debridement;Dressing change;Patient/family education;Pulsatile lavage with suction;Ultrasonic wound therapy '@35'  KHz (+/- 3 KHz) Wound Therapy - Frequency: 6X / week Wound Therapy - Follow Up Recommendations: Home health RN Wound Plan: debridement and dressing change.  Wound Therapy Goals- Improve the function of patient's integumentary system by progressing the wound(s) through the phases of wound healing (inflammation - proliferation - remodeling) by: Decrease Necrotic Tissue to: 60 Decrease Necrotic Tissue - Progress: Progressing toward goal Increase Granulation Tissue to: 40 Increase Granulation Tissue - Progress: Progressing toward goal Improve Drainage Characteristics: Min;Serous Improve Drainage Characteristics -  Progress: Progressing toward goal Goals/treatment plan/discharge plan were made with and agreed upon by patient/family: Yes Time For Goal Achievement: 7 days Wound Therapy - Potential for Goals: Good  Goals will be updated until maximal potential achieved or discharge criteria met.  Discharge criteria: when goals achieved, discharge from hospital, MD decision/surgical intervention, no progress towards goals, refusal/missing three consecutive treatments without notification or medical reason.  GP     Melessa Cowell, Tessie Fass 09/03/2013, 10:55 AM 09/03/2013  Donnella Sham, PT [phone]  (pager)

## 2013-09-03 NOTE — Progress Notes (Signed)
Agree with above, dont think needs to be debrided now, dont think she is septic from it, at this point can do dressing changes, not infected and can ask wound care service to see her for eventual care.

## 2013-09-03 NOTE — Progress Notes (Signed)
Subjective: Frail woman, says she gets up at home but does not appear able to do this here according to her nurse.  Objective: Vital signs in last 24 hours: Temp:  [99.2 F (37.3 C)-99.5 F (37.5 C)] 99.5 F (37.5 C) (08/13 0420) Pulse Rate:  [85-113] 109 (08/13 1039) Resp:  [16-18] 16 (08/13 0420) BP: (117-142)/(80-95) 118/84 mmHg (08/13 1039) SpO2:  [100 %] 100 % (08/13 0420) Weight:  [52.2 kg (115 lb 1.3 oz)-54.5 kg (120 lb 2.4 oz)] 54.5 kg (120 lb 2.4 oz) (08/13 0500) Last BM Date:  (PTA) Dysphagia II diet Afebrile, VSS Labs OK WBC is down  Intake/Output from previous day: 08/12 0701 - 08/13 0700 In: 0  Out: 2250 [Urine:2250] Intake/Output this shift: Total I/O In: 205.8 [I.V.:155.8; IV Piggyback:50] Out: -     General appearance: alert and no distress Skin: Skin color, texture, turgor normal. No rashes or lesions or as you can see from the picture above the Eschar has been debrided by PT hydrotherapy.  Nothing else  to grossly debride at this point.  it does not appear infected. Site  Measures 8 cm in width at the top, and about 20 cm long, full skin loss and debridement. Lab Results:   Recent Labs  09/02/13 0542 09/03/13 0601  WBC 14.8* 11.3*  HGB 12.4 10.9*  HCT 36.7 32.6*  PLT 568* 552*    BMET  Recent Labs  09/02/13 0542 09/03/13 0601  NA 141 141  K 2.7* 3.5*  CL 103 105  CO2 24 24  GLUCOSE 97 98  BUN <3* 3*  CREATININE 0.28* 0.34*  CALCIUM 8.6 8.8   PT/INR No results found for this basename: LABPROT, INR,  in the last 72 hours   Recent Labs Lab 09/01/13 1530 09/02/13 0542 09/03/13 0601  AST 102* 15 12  ALT ALKPHOS 92 108 100  BILITOT 0.8 0.9 1.0  PROT 7.1 6.2 5.9*  ALBUMIN 2.7* 2.6* 2.5*     Lipase     Component Value Date/Time   LIPASE 46 07/03/2013 1919     Studies/Results: Dg Chest 2 View  09/01/2013   CLINICAL DATA:  Weakness  EXAM: CHEST  2 VIEW  COMPARISON:  07/03/2013  FINDINGS: The heart size and  mediastinal contours are within normal limits. Both lungs are clear. The visualized skeletal structures are unremarkable.  IMPRESSION: No active cardiopulmonary disease.   Electronically Signed   By: Ruel Favors M.D.   On: 09/01/2013 16:25   Ct Head Wo Contrast  09/01/2013   CLINICAL DATA:  Altered mental status ; multiple sclerosis  EXAM: CT HEAD WITHOUT CONTRAST  TECHNIQUE: Contiguous axial images were obtained from the base of the skull through the vertex without intravenous contrast.  COMPARISON:  Brain MRI July 03, 2013 and brain CT July 03, 2013  FINDINGS: There is mild generalized atrophy, stable. There is no demonstrable mass, hemorrhage, extra-axial fluid collection, or midline shift. Widespread decreased attenuation throughout the supratentorial white matter is consistent with known multiple sclerosis. No acute infarct is appreciable on this study. There is no obvious is new gray-white compartment lesion. Bony calvarium appears intact. The mastoid air cells are clear.  IMPRESSION: Atrophy with widespread decreased attenuation in the supratentorial white matter consistent with extensive changes of demyelination. No hemorrhage or well-defined mass is seen. There is no extra-axial fluid. Note that intravenous contrast is necessary to assess for plaque activity.   Electronically Signed   By: Bretta Bang M.D.  On: 09/01/2013 16:41   Ct Abdomen Pelvis W Contrast  09/01/2013   CLINICAL DATA:  Sacral ulcer ; possible abscess  EXAM: CT ABDOMEN AND PELVIS WITH CONTRAST  TECHNIQUE: Multidetector CT imaging of the abdomen and pelvis was performed using the standard protocol following bolus administration of intravenous contrast.  CONTRAST:  OMNIPAQUE IOHEXOL 300 MG/ML  SOLN  COMPARISON:  None.  FINDINGS: There is increased density within the subcutaneous and deeper soft tissues at the level of the lower sacrum and coccyx. There is no discrete fluid collection. No definite sinus tract communicating  with the anus is demonstrated. Within the pelvis proper the urinary bladder contains a Foley catheter. The uterus and adnexal structures are grossly normal. The sigmoid colon and rectum contain a moderate amount of stool and gas. There is no free pelvic fluid.  The liver, gallbladder, pancreas, spleen, adrenal glands, and kidneys exhibit no acute abnormalities. There is mild fullness of the pancreatic head without evidence of a mass or inflammation. There is an 8 mm diameter hypodensity in the midpole of the left kidney most compatible with a cyst. The abdominal aorta exhibits normal caliber. The stomach and small and large bowel exhibit no acute abnormality. The lung bases are clear. The lumbar spine and bony pelvis exhibit no acute abnormalities.  IMPRESSION: 1. There are inflammatory changes consistent with cellulitis in the soft tissues over the tip of the sacrum and coccyx. No abscess is demonstrated. 2. No acute intra-abdominal or pelvic abnormality is demonstrated.   Electronically Signed   By: David  [place]   On: 09/01/2013 19:49    Medications: . amLODipine  5 mg Oral Daily  . aspirin EC  81 mg Oral Daily  . baclofen  5 mg Oral TID  . citalopram  10 mg Oral Daily  . collagenase   Topical Daily  . heparin  5,000 Units Subcutaneous 3 times per day  . insulin aspart  0-9 Units Subcutaneous 6 times per day  . metoprolol tartrate  25 mg Oral BID  . piperacillin-tazobactam (ZOSYN)  IV  3.375 g Intravenous Q8H  . potassium chloride  40 mEq Oral Daily  . vancomycin  750 mg Intravenous Q12H    Assessment/Plan 1.  Stage III  8 cm sacral pressure ulcer currently being treated with santyl and  Hydrotherapy 2.  Admitted for sepsis 3.  Multiple sclerosis 4.  Dementia/encephalopathy 5.  AODM 6.  Hypertension  7.  Hx of tobacco use 8.  Hx of TIA's    Plan:  She has a large open decubitus now.  She will need frequent dressing changes and skin care for this area.  If we could get her up that would  be most helpful, but the nurse says she cannot do anything here on her own.  I have written for TID wet to dry dressing changes, hydrotherapy will help with some of the stuff that the wet to dry does not debride.  I have ask PT to see. GM + on culture, but no growth so far.  Blood cultures and urine negative so far.  LOS: 2 days    Melodi Happel 09/03/2013

## 2013-09-03 NOTE — Progress Notes (Signed)
Speech Language Pathology Treatment: Dysphagia  Patient Details Name: [name] [name] MRN: [id number] DOB: [birth year] Today's Date: 09/03/2013 Time: 0900-0909 SLP Time Calculation (min): 9 min  Assessment / Plan / Recommendation Clinical Impression  Skilled treatment session focused on addressing dysphagia goals.  SLP facilitated session with 1 bite of Dys 2 textures with slightly prolonged mastication.  Patient then refused any further trials except for applesauce she consued 4-5 bites of puree with Total assist for self-feeding, timely transit with trace oral residue.  Patient consumed small straw sips with cough x1 and then refused all further trials.  Recommend to continue with current orders for now; however, if patient continues to decline Dys 2 textures recommend downgrade to Dys 1 with continued full supervision.     HPI HPI: This is a 43 y.o. year old female with significant past medical history of progressive debilitating MS with secondary dementia, type 2 DM, depression presenting with encephalopathy, sepsis, sacral ulcer cellulitis. Patient with worsening progressive confusion over the past week and has become progressively bedridden. Orders received for bedside swallow evaluation.     Pertinent Vitals Pain Assessment: No/denies pain  SLP Plan  Continue with current plan of care    Recommendations Diet recommendations: Dysphagia 2 (fine chop);Thin liquid Liquids provided via: Straw Medication Administration: Whole meds with puree Supervision: Patient able to self feed;Full supervision/cueing for compensatory strategies;Staff to assist with self feeding Compensations: Slow rate;Small sips/bites;Check for pocketing Postural Changes and/or Swallow Maneuvers: Seated upright 90 degrees;Upright 30-60 min after meal              Oral Care Recommendations: Oral care BID Follow up Recommendations: Skilled Nursing facility;24 hour supervision/assistance Plan: Continue with current  plan of care    GO     Charlane FerrettiMelissa Lamis Behrmann, M.A., CCC-SLP [phone]  Mahina Salatino 09/03/2013, 10:24 AM

## 2013-09-03 NOTE — Progress Notes (Signed)
ANTIBIOTIC CONSULT NOTE - FOLLOW UP  Pharmacy Consult for Vancomycin Indication: sacral decutitis ulcer  No Known Allergies  Patient Measurements: Height: 5\' 1"  (154.9 cm) Weight: 120 lb 2.4 oz (54.5 kg) IBW/kg (Calculated) : 47.8 Adjusted Body Weight:   Vital Signs: Temp: 99.6 F (37.6 C) (08/13 1400) Temp src: Oral (08/13 1400) BP: 131/82 mmHg (08/13 1400) Pulse Rate: 101 (08/13 1400) Intake/Output from previous day: 08/12 0701 - 08/13 0700 In: 0  Out: 2250 [Urine:2250] Intake/Output from this shift:    Labs:  Recent Labs  09/01/13 1530 09/02/13 0542 09/03/13 0601  WBC 12.8* 14.8* 11.3*  HGB 12.0 12.4 10.9*  PLT 362 568* 552*  CREATININE 0.21* 0.28* 0.34*   Estimated Creatinine Clearance: 68.4 ml/min (by C-G formula based on Cr of 0.34).  Recent Labs  09/03/13 1837  VANCOTROUGH 7.4*     Microbiology: Recent Results (from the past 720 hour(s))  WOUND CULTURE     Status: None   Collection Time    09/01/13  5:30 PM      Result Value Ref Range Status   Specimen Description WOUND PERIRECTAL   Final   Special Requests NONE   Final   Gram Stain     Final   Value: NO     WBC NO SQUAMOUS EPITHELIAL CELLS SEEN     MODERATE GRAM POSITIVE COCCI     IN PAIRS FEW GRAM POSITIVE RODS     [facility]   Culture     Final   Value: Culture reincubated for better growth     Performed at [facility]   Report Status PENDING   Incomplete  URINE CULTURE     Status: None   Collection Time    09/01/13  5:35 PM      Result Value Ref Range Status   Specimen Description URINE, CATHETERIZED   Final   Special Requests NONE   Final   Culture  Setup Time     Final   Value: 09/01/2013 23:22     Performed at [facility]   Colony Count     Final   Value: 40,000 COLONIES/ML     Performed at [facility]   Culture     Final   Value: Multiple bacterial morphotypes present, none predominant. Suggest appropriate recollection if  clinically indicated.     Performed at [facility]   Report Status 09/02/2013 FINAL   Final  CULTURE, BLOOD (ROUTINE X 2)     Status: None   Collection Time    09/01/13  6:00 PM      Result Value Ref Range Status   Specimen Description BLOOD ARM LEFT   Final   Special Requests BOTTLES DRAWN AEROBIC AND ANAEROBIC 10CC   Final   Culture  Setup Time     Final   Value: 09/01/2013 23:02     Performed at [facility]   Culture     Final   Value:        BLOOD CULTURE RECEIVED NO GROWTH TO DATE CULTURE WILL BE HELD FOR 5 DAYS BEFORE ISSUING A FINAL NEGATIVE REPORT     Performed at [facility]   Report Status PENDING   Incomplete  CULTURE, BLOOD (ROUTINE X 2)     Status: None   Collection Time    09/01/13  6:10 PM      Result Value Ref Range Status   Specimen Description BLOOD ARM LEFT  Final   Special Requests BOTTLES DRAWN AEROBIC ONLY 10CC   Final   Culture  Setup Time     Final   Value: 09/01/2013 23:01     Performed at [facility]   Culture     Final   Value:        BLOOD CULTURE RECEIVED NO GROWTH TO DATE CULTURE WILL BE HELD FOR 5 DAYS BEFORE ISSUING A FINAL NEGATIVE REPORT     Performed at [facility]   Report Status PENDING   Incomplete    Anti-infectives   Start     Dose/Rate Route Frequency Ordered Stop   09/04/13 0600  vancomycin (VANCOCIN) IVPB 1000 mg/200 mL premix     1,000 mg 200 mL/hr over 60 Minutes Intravenous Every 12 hours 09/03/13 2017     09/02/13 0600  vancomycin (VANCOCIN) IVPB 750 mg/150 ml premix  Status:  Discontinued     750 mg 150 mL/hr over 60 Minutes Intravenous Every 12 hours 09/01/13 1753 09/03/13 2014   09/01/13 2359  piperacillin-tazobactam (ZOSYN) IVPB 3.375 g     3.375 g 12.5 mL/hr over 240 Minutes Intravenous Every 8 hours 09/01/13 1753     09/01/13 1715  piperacillin-tazobactam (ZOSYN) IVPB 3.375 g     3.375 g 100 mL/hr over 30 Minutes Intravenous  Once 09/01/13 1703 09/01/13 1859   09/01/13 1715   vancomycin (VANCOCIN) IVPB 1000 mg/200 mL premix     1,000 mg 200 mL/hr over 60 Minutes Intravenous  Once 09/01/13 1703 09/01/13 2000      Assessment:42 yr old female with sacral decbtitis ulcer suspirious for infection and concern for osteo being treated with vancomycin and zosyn. Trough today was 7.4 on vancomycin 750 mg q12h. Goal is 15-20.  Goal of Therapy:  Vancomycin trough level 15-20 mcg/ml  Plan:  Increase dose to vancomycin 1 Gm IV q12 hr. Follow up culture results Recheck vanc trough when appropriate.  [name] [name] 09/03/2013,8:25 PM

## 2013-09-04 DIAGNOSIS — E119 Type 2 diabetes mellitus without complications: Secondary | ICD-10-CM

## 2013-09-04 LAB — CBC WITH DIFFERENTIAL/PLATELET
BASOS PCT: 0 % (ref 0–1)
Basophils Absolute: 0 10*3/uL (ref 0.0–0.1)
EOS ABS: 0.1 10*3/uL (ref 0.0–0.7)
Eosinophils Relative: 1 % (ref 0–5)
HCT: 34.7 % — ABNORMAL LOW (ref 36.0–46.0)
HEMOGLOBIN: 11.7 g/dL — AB (ref 12.0–15.0)
Lymphocytes Relative: 10 % — ABNORMAL LOW (ref 12–46)
Lymphs Abs: 1.4 10*3/uL (ref 0.7–4.0)
MCH: 31.6 pg (ref 26.0–34.0)
MCHC: 33.7 g/dL (ref 30.0–36.0)
MCV: 93.8 fL (ref 78.0–100.0)
Monocytes Absolute: 0.6 10*3/uL (ref 0.1–1.0)
Monocytes Relative: 5 % (ref 3–12)
NEUTROS ABS: 11.3 10*3/uL — AB (ref 1.7–7.7)
NEUTROS PCT: 84 % — AB (ref 43–77)
Platelets: 570 10*3/uL — ABNORMAL HIGH (ref 150–400)
RBC: 3.7 MIL/uL — AB (ref 3.87–5.11)
RDW: 12.6 % (ref 11.5–15.5)
WBC: 13.4 10*3/uL — ABNORMAL HIGH (ref 4.0–10.5)

## 2013-09-04 LAB — GLUCOSE, CAPILLARY
Glucose-Capillary: 120 mg/dL — ABNORMAL HIGH (ref 70–99)
Glucose-Capillary: 130 mg/dL — ABNORMAL HIGH (ref 70–99)
Glucose-Capillary: 118 mg/dL — ABNORMAL HIGH (ref 70–99)
Glucose-Capillary: 115 mg/dL — ABNORMAL HIGH (ref 70–99)
Glucose-Capillary: 117 mg/dL — ABNORMAL HIGH (ref 70–99)

## 2013-09-04 LAB — COMPREHENSIVE METABOLIC PANEL
ALK PHOS: 113 U/L (ref 39–117)
ALT: 12 U/L (ref 0–35)
ANION GAP: 17 — AB (ref 5–15)
AST: 16 U/L (ref 0–37)
Albumin: 2.8 g/dL — ABNORMAL LOW (ref 3.5–5.2)
BUN: <3 mg/dL — ABNORMAL LOW (ref 6–23)
CALCIUM: 8.9 mg/dL (ref 8.4–10.5)
CO2: 22 meq/L (ref 19–32)
Chloride: 103 mEq/L (ref 96–112)
Creatinine, Ser: 0.3 mg/dL — ABNORMAL LOW (ref 0.50–1.10)
GFR calc Af Amer: >90 mL/min (ref >90)
GLUCOSE: 120 mg/dL — AB (ref 70–99)
Potassium: 3.3 mEq/L — ABNORMAL LOW (ref 3.7–5.3)
SODIUM: 142 meq/L (ref 137–147)
Total Bilirubin: 1 mg/dL (ref 0.3–1.2)
Total Protein: 6.6 g/dL (ref 6.0–8.3)

## 2013-09-04 MED ORDER — DOXYCYCLINE HYCLATE 100 MG PO TABS
100.0000 mg | ORAL_TABLET | Freq: Two times a day (BID) | ORAL | Status: DC
  Administered 2013-09-04 – 2013-09-07 (×7): 100 mg via ORAL
  Filled 2013-09-04 (×8): qty 1

## 2013-09-04 MED ORDER — AMOXICILLIN-POT CLAVULANATE 875-125 MG PO TABS
1.0000 | ORAL_TABLET | Freq: Two times a day (BID) | ORAL | Status: DC
  Administered 2013-09-04 – 2013-09-06 (×4): 1 via ORAL
  Filled 2013-09-04 (×6): qty 1

## 2013-09-04 MED ORDER — DOXYCYCLINE HYCLATE 100 MG PO TABS: 100.0000 mg | ORAL_TABLET | Freq: Two times a day (BID) | ORAL | Status: DC

## 2013-09-04 MED ORDER — SACCHAROMYCES BOULARDII 250 MG PO CAPS: 250.0000 mg | ORAL_CAPSULE | Freq: Two times a day (BID) | ORAL | Status: AC

## 2013-09-04 MED ORDER — SACCHAROMYCES BOULARDII 250 MG PO CAPS
250.0000 mg | ORAL_CAPSULE | Freq: Two times a day (BID) | ORAL | Status: DC
  Administered 2013-09-04 – 2013-09-07 (×7): 250 mg via ORAL
  Filled 2013-09-04 (×8): qty 1

## 2013-09-04 MED ORDER — AMOXICILLIN-POT CLAVULANATE 875-125 MG PO TABS: 1.0000 | ORAL_TABLET | Freq: Two times a day (BID) | ORAL | Status: DC

## 2013-09-04 NOTE — Discharge Summary (Addendum)
Physician Discharge Summary  [name] [name] BJY:[id number] DOB: [birth year] DOA: 09/01/2013  PCP: [name] [name]  Admit date: 09/01/2013 Discharge date: 09/04/2013  Time spent: 45 minutes  Recommendations for Outpatient Follow-up:  1. PCP in 1 week 2. Wound care-Wet to Dry, Santyl ointment, covered with ABD pads, BID 3. Follow up in Wound Center on 8/24 at 9am   Discharge Diagnoses:    Encephalopathy   Wound infection   Stage 3 decubitus wound   Advanced Multiple sclerosis   Dementia   HTN   DM   Tobacco use   H/o TIA   Hypokalemia  Discharge Condition: stable  Diet recommendation: DM/low sodium  Filed Weights   09/03/13 0420 09/03/13 0500 09/04/13 0403  Weight: 52.2 kg (115 lb 1.3 oz) 54.5 kg (120 lb 2.4 oz) 51.3 kg (113 lb 1.5 oz)    History of present illness:  Chief Complaint: encephalopathy, sepsis, sacral ulcer cellulitis  History of Present Illness:This is a 43 y.o. year old female with significant past medical history of progressive debilitating MS with secondary dementia, type 2 DM, depression presented with encephalopathy, sepsis, sacral ulcer cellulitis. Level V caveat as pt is confused on exam. No family at bedside. Per report, pt was noted to have worsening progressive confusion over the past week by her son. Per report, pt has become progressively bedridden, usually wheelchair-bound. Also with worsening sacral wound with foul smell as well as increased dark urine with worsening odor.    Hospital Course:  1. Wound infection/sacral decubitus ulcers  -Improved with Wound care, Empiric Abx-Vanc/Zosyn, transitioned to Po Abx, wound Cx polymicrobial with few pseudomonas and hence Augmentin changed to CIprofloxacin  -followed by Surgery/Wound RN and PT for hydrotherapy -Wound much improved with Hydrotherapy -needs continued wound care at this point, referral to the Wound Care center made   2. Metabolic Encephalopathy  -Encephalopathy likely multifactorial  in the setting of sepsis and baseline dementia/multiple sclerosis.  -improved -back to baseline with treatment of above  3-Dementia/Advanced multiple sclerosis  -progressive disease  -Continue antispasticity meds  -CSW following, needs SNF placement   4-HTN  -stable, cont coreg   5-DM  -SSI, A1C 5.5   6. Hypokalemia  -replaced     Procedures:  Hydrotherapy  Consultations:  CCS   Discharge Exam: Filed Vitals:   09/04/13 1335  BP: 126/90  Pulse: 89  Temp: 98.4 F (36.9 C)  Resp: 18    General: AAOx2, frail, chronically ill Cardiovascular: S1S2/RRR Respiratory: CTAB  Discharge Instructions You were cared for by a hospitalist during your hospital stay. If you have any questions about your discharge medications or the care you received while you were in the hospital after you are discharged, you can call the unit and asked to speak with the hospitalist on call if the hospitalist that took care of you is not available. Once you are discharged, your primary care physician will handle any further medical issues. Please note that NO REFILLS for any discharge medications will be authorized once you are discharged, as it is imperative that you return to your primary care physician (or establish a relationship with a primary care physician if you do not have one) for your aftercare needs so that they can reassess your need for medications and monitor your lab values.     Medication List         amLODipine 5 MG tablet  Commonly known as:  NORVASC  Take 1 tablet (5 mg total) by mouth daily.  amoxicillin-clavulanate 875-125 MG per tablet  Commonly known as:  AUGMENTIN  Take 1 tablet by mouth every 12 (twelve) hours. For 7days     aspirin EC 81 MG tablet  Take 1 tablet (81 mg total) by mouth daily.     baclofen 10 MG tablet  Commonly known as:  LIORESAL  Take 0.5 tablets (5 mg total) by mouth 3 (three) times daily.     citalopram 10 MG tablet  Commonly known as:   CELEXA  Take 1 tablet (10 mg total) by mouth daily.     doxycycline 100 MG tablet  Commonly known as:  VIBRA-TABS  Take 1 tablet (100 mg total) by mouth every 12 (twelve) hours. For 7 days     meclizine 25 MG tablet  Commonly known as:  ANTIVERT  Take 1 tablet (25 mg total) by mouth 3 (three) times daily as needed for dizziness or nausea.     metFORMIN 500 MG tablet  Commonly known as:  GLUCOPHAGE  Take 1 tablet (500 mg total) by mouth daily with breakfast.     metoprolol tartrate 25 MG tablet  Commonly known as:  LOPRESSOR  Take 1 tablet (25 mg total) by mouth 2 (two) times daily.     saccharomyces boulardii 250 MG capsule  Commonly known as:  FLORASTOR  Take 1 capsule (250 mg total) by mouth 2 (two) times daily.       No Known Allergies     Follow-up Information   Follow up with [name] On 09/14/2013. (at 9:15am, please arrive at 9am)    Specialty:  Wound Care   Contact information:   [address], [address] [place] [address] [phone]       The results of significant diagnostics from this hospitalization (including imaging, microbiology, ancillary and laboratory) are listed below for reference.    Significant Diagnostic Studies: Dg Chest 2 View  09/01/2013   CLINICAL DATA:  Weakness  EXAM: CHEST  2 VIEW  COMPARISON:  07/03/2013  FINDINGS: The heart size and mediastinal contours are within normal limits. Both lungs are clear. The visualized skeletal structures are unremarkable.  IMPRESSION: No active cardiopulmonary disease.   Electronically Signed   By: Ruel Favors M.D.   On: 09/01/2013 16:25   Ct Head Wo Contrast  09/01/2013   CLINICAL DATA:  Altered mental status ; multiple sclerosis  EXAM: CT HEAD WITHOUT CONTRAST  TECHNIQUE: Contiguous axial images were obtained from the base of the skull through the vertex without intravenous contrast.  COMPARISON:  Brain MRI July 03, 2013 and brain CT July 03, 2013  FINDINGS: There  is mild generalized atrophy, stable. There is no demonstrable mass, hemorrhage, extra-axial fluid collection, or midline shift. Widespread decreased attenuation throughout the supratentorial white matter is consistent with known multiple sclerosis. No acute infarct is appreciable on this study. There is no obvious is new gray-white compartment lesion. Bony calvarium appears intact. The mastoid air cells are clear.  IMPRESSION: Atrophy with widespread decreased attenuation in the supratentorial white matter consistent with extensive changes of demyelination. No hemorrhage or well-defined mass is seen. There is no extra-axial fluid. Note that intravenous contrast is necessary to assess for plaque activity.   Electronically Signed   By: Bretta Bang M.D.   On: 09/01/2013 16:41   Ct Abdomen Pelvis W Contrast  09/01/2013   CLINICAL DATA:  Sacral ulcer ; possible abscess  EXAM: CT ABDOMEN AND PELVIS WITH CONTRAST  TECHNIQUE:  Multidetector CT imaging of the abdomen and pelvis was performed using the standard protocol following bolus administration of intravenous contrast.  CONTRAST:  OMNIPAQUE IOHEXOL 300 MG/ML  SOLN  COMPARISON:  None.  FINDINGS: There is increased density within the subcutaneous and deeper soft tissues at the level of the lower sacrum and coccyx. There is no discrete fluid collection. No definite sinus tract communicating with the anus is demonstrated. Within the pelvis proper the urinary bladder contains a Foley catheter. The uterus and adnexal structures are grossly normal. The sigmoid colon and rectum contain a moderate amount of stool and gas. There is no free pelvic fluid.  The liver, gallbladder, pancreas, spleen, adrenal glands, and kidneys exhibit no acute abnormalities. There is mild fullness of the pancreatic head without evidence of a mass or inflammation. There is an 8 mm diameter hypodensity in the midpole of the left kidney most compatible with a cyst. The abdominal aorta  exhibits normal caliber. The stomach and small and large bowel exhibit no acute abnormality. The lung bases are clear. The lumbar spine and bony pelvis exhibit no acute abnormalities.  IMPRESSION: 1. There are inflammatory changes consistent with cellulitis in the soft tissues over the tip of the sacrum and coccyx. No abscess is demonstrated. 2. No acute intra-abdominal or pelvic abnormality is demonstrated.   Electronically Signed   By: David  [place]   On: 09/01/2013 19:49    Microbiology: Recent Results (from the past 240 hour(s))  WOUND CULTURE     Status: None   Collection Time    09/01/13  5:30 PM      Result Value Ref Range Status   Specimen Description WOUND PERIRECTAL   Final   Special Requests NONE   Final   Gram Stain     Final   Value: NO     WBC NO SQUAMOUS EPITHELIAL CELLS SEEN     MODERATE GRAM POSITIVE COCCI     IN PAIRS FEW GRAM POSITIVE RODS     Performed at [facility]   Culture     Final   Value: Culture reincubated for better growth     Performed at [facility]   Report Status PENDING   Incomplete  URINE CULTURE     Status: None   Collection Time    09/01/13  5:35 PM      Result Value Ref Range Status   Specimen Description URINE, CATHETERIZED   Final   Special Requests NONE   Final   Culture  Setup Time     Final   Value: 09/01/2013 23:22     Performed at [facility] Count     Final   Value: 40,000 COLONIES/ML     Performed at [facility]   Culture     Final   Value: Multiple bacterial morphotypes present, none predominant. Suggest appropriate recollection if clinically indicated.     Performed at [facility]   Report Status 09/02/2013 FINAL   Final  CULTURE, BLOOD (ROUTINE X 2)     Status: None   Collection Time    09/01/13  6:00 PM      Result Value Ref Range Status   Specimen Description BLOOD ARM LEFT   Final   Special Requests BOTTLES DRAWN AEROBIC AND ANAEROBIC 10CC   Final   Culture  Setup  Time     Final   Value: 09/01/2013 23:02     Performed at [facility]  Culture     Final   Value:        BLOOD CULTURE RECEIVED NO GROWTH TO DATE CULTURE WILL BE HELD FOR 5 DAYS BEFORE ISSUING A FINAL NEGATIVE REPORT     Performed at [facility]   Report Status PENDING   Incomplete  CULTURE, BLOOD (ROUTINE X 2)     Status: None   Collection Time    09/01/13  6:10 PM      Result Value Ref Range Status   Specimen Description BLOOD ARM LEFT   Final   Special Requests BOTTLES DRAWN AEROBIC ONLY 10CC   Final   Culture  Setup Time     Final   Value: 09/01/2013 23:01     Performed at [facility]   Culture     Final   Value:        BLOOD CULTURE RECEIVED NO GROWTH TO DATE CULTURE WILL BE HELD FOR 5 DAYS BEFORE ISSUING A FINAL NEGATIVE REPORT     Performed at [facility]   Report Status PENDING   Incomplete     Labs: Basic Metabolic Panel:  Recent Labs Lab 09/01/13 1530 09/01/13 1810 09/02/13 0542 09/03/13 0601 09/04/13 0524  NA 138  --  141 141 142  K 7.1* 2.9* 2.7* 3.5* 3.3*  CL 103  --  103 105 103  CO2 24  --  24 24 22   GLUCOSE 117*  --  97 98 120*  BUN 4*  --  <3* 3* <3*  CREATININE 0.21*  --  0.28* 0.34* 0.30*  CALCIUM 8.2*  --  8.6 8.8 8.9  MG  --  1.8  --   --   --    Liver Function Tests:  Recent Labs Lab 09/01/13 1530 09/02/13 0542 09/03/13 0601 09/04/13 0524  AST 102* 15 12 16   ALT 24 12 10 12   ALKPHOS 92 108 100 113  BILITOT 0.8 0.9 1.0 1.0  PROT 7.1 6.2 5.9* 6.6  ALBUMIN 2.7* 2.6* 2.5* 2.8*   No results found for this basename: LIPASE, AMYLASE,  in the last 168 hours No results found for this basename: AMMONIA,  in the last 168 hours CBC:  Recent Labs Lab 09/01/13 1530 09/02/13 0542 09/03/13 0601 09/04/13 0524  WBC 12.8* 14.8* 11.3* 13.4*  NEUTROABS 10.8* 10.8* 8.2* 11.3*  HGB 12.0 12.4 10.9* 11.7*  HCT 34.8* 36.7 32.6* 34.7*  MCV 92.6 92.7 94.5 93.8  PLT 362 568* 552* 570*   Cardiac Enzymes: No  results found for this basename: CKTOTAL, CKMB, CKMBINDEX, TROPONINI,  in the last 168 hours BNP: BNP (last 3 results) No results found for this basename: PROBNP,  in the last 8760 hours CBG:  Recent Labs Lab 09/03/13 2017 09/04/13 0001 09/04/13 0402 09/04/13 0756 09/04/13 1152  GLUCAP 103* 107* 120* 130* 118*       Signed:  Illa Enlow  [facility] 09/04/2013, 2:54 PM

## 2013-09-04 NOTE — Clinical Social Work Note (Signed)
CSW was unable to assess patient today. CSW has left hand off report for weekend CSW indicating that patient will need to DC to SNF on Saturday.   Roddie Mc MSW, [place], [place], [phone]

## 2013-09-04 NOTE — Progress Notes (Addendum)
Physical Therapy Wound Treatment Patient Details  Name: [name] [name] MRN: [id number] Date of Birth: 1970/05/26  Today's Date: 09/04/2013 Time: 0211-1735 Time Calculation (min): 41 min  **Pt not on air mattress currently. Recommend bed with air mattress.**  Subjective  Subjective: Pt saying, "ohh, ohh" with turning but no signs of pain with hydrotherapy  Pain Score: Pain Score: Premedicated. Cries out with turning but no signs of pain after that.  Wound Assessment  Pressure Ulcer 09/01/13 Unstageable - Full thickness tissue loss in which the base of the ulcer is covered by slough (yellow, tan, gray, green or brown) and/or eschar (tan, brown or black) in the wound bed. (Active)  Dressing Type ABD;Gauze (Comment);Moist to dry;Barrier Film (skin prep) 09/04/2013 12:30 PM  Dressing Changed 09/04/2013 12:30 PM  Dressing Change Frequency Other (Comment) 09/04/2013 12:30 PM  State of Healing Eschar 09/04/2013 12:30 PM  Site / Wound Assessment Black;Brown;Pink;Yellow 09/04/2013 12:30 PM  % Wound base Red or Granulating 10% 09/04/2013 12:30 PM  % Wound base Yellow 50% 09/04/2013 12:30 PM  % Wound base Black 40% 09/04/2013 12:30 PM  % Wound base Other (Comment) 0% 09/04/2013 12:30 PM  Peri-wound Assessment Intact 09/04/2013 12:30 PM  Wound Length (cm) 10 cm 09/02/2013  3:00 PM  Wound Width (cm) 15 cm 09/02/2013  3:00 PM  Wound Depth (cm) 1 cm 09/02/2013  3:00 PM  Margins Unattached edges (unapproximated) 09/04/2013 12:30 PM  Drainage Amount Minimal 09/04/2013 12:30 PM  Drainage Description Green;Odor 09/04/2013 12:30 PM  Treatment Debridement (Selective);Hydrotherapy (Pulse lavage);Packing (Saline gauze) 09/04/2013 12:30 PM   Hydrotherapy Pulsed lavage therapy - wound location: sacrum Pulsed Lavage with Suction (psi): 8 psi (to 12) Pulsed Lavage with Suction - Normal Saline Used: 1000 mL Pulsed Lavage Tip: Tip with splash shield Selective Debridement Selective Debridement - Location:  sacrum Selective Debridement - Tools Used: Forceps;Scissors Selective Debridement - Tissue Removed: black eschar, necrotic fat   Wound Assessment and Plan  Wound Therapy - Assess/Plan/Recommendations Wound Therapy - Clinical Statement: Continue to remove significant necrotic tissue with pulsatile lavage and selective debridement. No signs of pain with either. Hydrotherapy Plan: Debridement;Dressing change;Patient/family education;Pulsatile lavage with suction;Ultrasonic wound therapy '@35'  KHz (+/- 3 KHz) Wound Therapy - Frequency: 6X / week Wound Therapy - Follow Up Recommendations: Skilled nursing facility Wound Plan: See plan  Wound Therapy Goals- Improve the function of patient's integumentary system by progressing the wound(s) through the phases of wound healing (inflammation - proliferation - remodeling) by: Decrease Necrotic Tissue to: 60 Decrease Necrotic Tissue - Progress: Progressing toward goal Increase Granulation Tissue to: 40 Increase Granulation Tissue - Progress: Progressing toward goal Improve Drainage Characteristics: Min;Serous Improve Drainage Characteristics - Progress: Progressing toward goal  Goals will be updated until maximal potential achieved or discharge criteria met.  Discharge criteria: when goals achieved, discharge from hospital, MD decision/surgical intervention, no progress towards goals, refusal/missing three consecutive treatments without notification or medical reason.  GP     Teagen Mcleary 09/04/2013, 1:34 PM  [place]

## 2013-09-04 NOTE — Progress Notes (Signed)
[facility] Progress Note     Subjective: Pt not having much pain sitting still, but pain with turning her to look at the wound.  Not eating much.  Hydrotherapy pending.  No N/V.    Objective: Vital signs in last 24 hours: Temp:  [98.7 F (37.1 C)-99.6 F (37.6 C)] 98.9 F (37.2 C) (08/14 0403) Pulse Rate:  [95-109] 95 (08/14 0403) Resp:  [16-18] 16 (08/14 0403) BP: (118-135)/(82-93) 135/93 mmHg (08/14 0403) SpO2:  [99 %-100 %] 100 % (08/14 0403) Weight:  [113 lb 1.5 oz (51.3 kg)] 113 lb 1.5 oz (51.3 kg) (08/14 0403) Last BM Date:  (PTA )  Intake/Output from previous day: 08/13 0701 - 08/14 0700 In: 1295.8 [I.V.:595.8; IV Piggyback:700] Out: 1300 [Urine:1300] Intake/Output this shift:    PE: Gen:  Alert, NAD, pleasant Large sacral decubitus ulcer now unroofed, with good beefy red granulation tissue and some bleeding, some yellow slough, no signs of infection or need for OR debridement   Lab Results:   Recent Labs  09/03/13 0601 09/04/13 0524  WBC 11.3* 13.4*  HGB 10.9* 11.7*  HCT 32.6* 34.7*  PLT 552* 570*   BMET  Recent Labs  09/03/13 0601 09/04/13 0524  NA 141 142  K 3.5* 3.3*  CL 105 103  CO2 24 22  GLUCOSE 98 120*  BUN 3* <3*  CREATININE 0.34* 0.30*  CALCIUM 8.8 8.9   PT/INR No results found for this basename: LABPROT, INR,  in the last 72 hours CMP     Component Value Date/Time   NA 142 09/04/2013 0524   K 3.3* 09/04/2013 0524   CL 103 09/04/2013 0524   CO2 22 09/04/2013 0524   GLUCOSE 120* 09/04/2013 0524   BUN <3* 09/04/2013 0524   CREATININE 0.30* 09/04/2013 0524   CREATININE 0.76 09/17/2012 1415   CALCIUM 8.9 09/04/2013 0524   PROT 6.6 09/04/2013 0524   ALBUMIN 2.8* 09/04/2013 0524   AST 16 09/04/2013 0524   ALT 12 09/04/2013 0524   ALKPHOS 113 09/04/2013 0524   BILITOT 1.0 09/04/2013 0524   GFRNONAA >90 09/04/2013 0524   GFRAA >90 09/04/2013 0524   Lipase     Component Value Date/Time   LIPASE 46 07/03/2013 1919        Studies/Results: No results found.  Anti-infectives: Anti-infectives   Start     Dose/Rate Route Frequency Ordered Stop   09/04/13 0600  vancomycin (VANCOCIN) IVPB 1000 mg/200 mL premix     1,000 mg 200 mL/hr over 60 Minutes Intravenous Every 12 hours 09/03/13 2017     09/02/13 0600  vancomycin (VANCOCIN) IVPB 750 mg/150 ml premix  Status:  Discontinued     750 mg 150 mL/hr over 60 Minutes Intravenous Every 12 hours 09/01/13 1753 09/03/13 2014   09/01/13 2359  piperacillin-tazobactam (ZOSYN) IVPB 3.375 g     3.375 g 12.5 mL/hr over 240 Minutes Intravenous Every 8 hours 09/01/13 1753     09/01/13 1715  piperacillin-tazobactam (ZOSYN) IVPB 3.375 g     3.375 g 100 mL/hr over 30 Minutes Intravenous  Once 09/01/13 1703 09/01/13 1859   09/01/13 1715  vancomycin (VANCOCIN) IVPB 1000 mg/200 mL premix     1,000 mg 200 mL/hr over 60 Minutes Intravenous  Once 09/01/13 1703 09/01/13 2000        Pictures from 09/03/13   Assessment/Plan 1. Stage III 8 cm sacral pressure ulcer currently being treated with santyl and Hydrotherapy  2. Admitted for sepsis  3. Multiple sclerosis  4. Dementia/encephalopathy  5. AODM  6. Hypertension  7. Hx of tobacco use  8. Hx of TIA's   Plan:  1.  She has a large open decubitus now. She will need frequent dressing changes and skin care for this area. If we could get her up that would be most helpful, but the nurse says she cannot do anything here on her own.  2.  No need for OR debridement.  Continue TID wet to dry dressing changes, hydrotherapy will help with some of the stuff that the wet to dry does not debride.  3.  GM + on culture, but NGTD 4.  Blood cultures NGTD and urine contaminated 5.  Can be discharged from our perspective when medically stable, she will need chronic wound management from the wound care center.  She does not need any specific follow up with general surgery since we did not operate on her.   6.  Will sign off, call  with questions/concerns    LOS: 3 days    DORT, Chassidy Layson 09/04/2013, 9:50 AM Pager: [phone]

## 2013-09-04 NOTE — Progress Notes (Signed)
Speech Language Pathology Treatment: Dysphagia  Patient Details Name: [name] [name] MRN: [id number] DOB: [birth year] Today's Date: 09/04/2013 Time: 0902-0917 SLP Time Calculation (min): 15 min  Assessment / Plan / Recommendation Clinical Impression  Skilled treatment session focused on addressing dysphagia goals.  SLP facilitated session with Total assist for PO intake today and while patient demonstrated effective mastication of Dys.2 textures given her current decreased endurance and overall fatigue recommend a more conservative diet.  Recommend diet down grade to Dys. 1 pureed textures and continued full assist in hopes of grater PO intake.  Patient required Max multimodal cues for use of compensatory strategies: small single straw sips with a slow pace which were effective at preventing overt s/s of aspiration following initial cough following consecutive sips.  Patient continues to require skilled SLP services for dysphagia and would benefit from a cognitive-linguistic evaluation as well.     HPI HPI: This is a 43 y.o. year old female with significant past medical history of progressive debilitating MS with secondary dementia, type 2 DM, depression presenting with encephalopathy, sepsis, sacral ulcer cellulitis. Patient with worsening progressive confusion over the past week and has become progressively bedridden. Orders received for bedside swallow evaluation.     Pertinent Vitals Pain Assessment: No/denies pain  SLP Plan  Continue with current plan of care    Recommendations Diet recommendations: Dysphagia 1 (puree);Thin liquid Liquids provided via: Straw;Cup Medication Administration: Whole meds with puree Supervision: Patient able to self feed;Full supervision/cueing for compensatory strategies;Staff to assist with self feeding Compensations: Slow rate;Small sips/bites;Check for pocketing Postural Changes and/or Swallow Maneuvers: Seated upright 90 degrees;Upright 30-60 min after  meal              Oral Care Recommendations: Oral care BID Follow up Recommendations: Skilled Nursing facility;24 hour supervision/assistance Plan: Continue with current plan of care    GO     Charlane Ferretti., CCC-SLP [phone]  [name] [name] 09/04/2013, 9:34 AM

## 2013-09-04 NOTE — Progress Notes (Signed)
Agree with above, no role for any more debridement, do not think this is infected now.  I do think she needs wound service at some point.  We will sign off

## 2013-09-05 LAB — CBC WITH DIFFERENTIAL/PLATELET
BASOS ABS: 0 10*3/uL (ref 0.0–0.1)
BASOS PCT: 0 % (ref 0–1)
EOS ABS: 0.3 10*3/uL (ref 0.0–0.7)
Eosinophils Relative: 2 % (ref 0–5)
HCT: 35.1 % — ABNORMAL LOW (ref 36.0–46.0)
HEMOGLOBIN: 11.6 g/dL — AB (ref 12.0–15.0)
Lymphocytes Relative: 23 % (ref 12–46)
Lymphs Abs: 2.5 10*3/uL (ref 0.7–4.0)
MCH: 31.2 pg (ref 26.0–34.0)
MCHC: 33 g/dL (ref 30.0–36.0)
MCV: 94.4 fL (ref 78.0–100.0)
Monocytes Absolute: 1 10*3/uL (ref 0.1–1.0)
Monocytes Relative: 9 % (ref 3–12)
NEUTROS PCT: 66 % (ref 43–77)
Neutro Abs: 7.3 10*3/uL (ref 1.7–7.7)
PLATELETS: 473 10*3/uL — AB (ref 150–400)
RBC: 3.72 MIL/uL — ABNORMAL LOW (ref 3.87–5.11)
RDW: 12.9 % (ref 11.5–15.5)
WBC: 11 10*3/uL — ABNORMAL HIGH (ref 4.0–10.5)

## 2013-09-05 LAB — COMPREHENSIVE METABOLIC PANEL
ALK PHOS: 110 U/L (ref 39–117)
ALT: 12 U/L (ref 0–35)
AST: 18 U/L (ref 0–37)
Albumin: 2.8 g/dL — ABNORMAL LOW (ref 3.5–5.2)
Anion gap: 10 (ref 5–15)
BILIRUBIN TOTAL: 0.7 mg/dL (ref 0.3–1.2)
BUN: 5 mg/dL — AB (ref 6–23)
CHLORIDE: 107 meq/L (ref 96–112)
CO2: 28 meq/L (ref 19–32)
Calcium: 9.6 mg/dL (ref 8.4–10.5)
Creatinine, Ser: 0.39 mg/dL — ABNORMAL LOW (ref 0.50–1.10)
Glucose, Bld: 106 mg/dL — ABNORMAL HIGH (ref 70–99)
POTASSIUM: 4.1 meq/L (ref 3.7–5.3)
SODIUM: 145 meq/L (ref 137–147)
Total Protein: 6.4 g/dL (ref 6.0–8.3)

## 2013-09-05 LAB — GLUCOSE, CAPILLARY
GLUCOSE-CAPILLARY: 100 mg/dL — AB (ref 70–99)
GLUCOSE-CAPILLARY: 104 mg/dL — AB (ref 70–99)
GLUCOSE-CAPILLARY: 119 mg/dL — AB (ref 70–99)
GLUCOSE-CAPILLARY: 97 mg/dL (ref 70–99)
Glucose-Capillary: 110 mg/dL — ABNORMAL HIGH (ref 70–99)
Glucose-Capillary: 113 mg/dL — ABNORMAL HIGH (ref 70–99)
Glucose-Capillary: 101 mg/dL — ABNORMAL HIGH (ref 70–99)

## 2013-09-05 NOTE — Clinical Social Work Note (Signed)
CSW spoke with Tammy of [facility]. Per Tammy, facility can accept patient on Sunday, 09/06/13 pending RN review of clinicals. CSW made patient's RN, Vaughan Basta aware of the above. CSW met with patient's son Idolina Primer who was present at bedside. CSW made [facility] aware of available bed offer. Per Idolina Primer, he will visit facility. CSW to follow tomorrow with d/c.  [place], [place] Weekend Clinical Social Worker [phone]

## 2013-09-05 NOTE — Progress Notes (Signed)
Pt seen and examined, no changes from DC summary 8/14 DC to SNF today

## 2013-09-05 NOTE — Progress Notes (Signed)
Physical Therapy Wound Treatment Patient Details  Name: [name] [name] MRN: [id number] Date of Birth: May 18, 1970  Today's Date: 09/05/2013 Time: 4383-8184 Time Calculation (min): 42 min  Subjective  Subjective: Pt saying, "ohh, ohh" and "hurts," with turning and at times during pulsatile lavage. When asked where she is hurting she denies pain.  Pain Score: Pain Score: Pt premedicated.  Wound Assessment  Pressure Ulcer 09/01/13 Unstageable - Full thickness tissue loss in which the base of the ulcer is covered by slough (yellow, tan, gray, green or brown) and/or eschar (tan, brown or black) in the wound bed. (Active)  Dressing Type ABD;Gauze (Comment);Moist to dry;Barrier Film (skin prep); santyl 09/05/2013  9:50 AM  Dressing Changed 09/05/2013  9:50 AM  Dressing Change Frequency Other (Comment) 09/05/2013  9:50 AM  State of Healing Eschar 09/05/2013  9:50 AM  Site / Wound Assessment Black;Brown;Pink;Yellow 09/05/2013  9:50 AM  % Wound base Red or Granulating 10% 09/05/2013  9:50 AM  % Wound base Yellow 60% 09/05/2013  9:50 AM  % Wound base Black 30% 09/05/2013  9:50 AM  % Wound base Other (Comment) 0% 09/05/2013  9:50 AM  Peri-wound Assessment Intact 09/05/2013  9:50 AM  Wound Length (cm) 10 cm 09/02/2013  3:00 PM  Wound Width (cm) 15 cm 09/02/2013  3:00 PM  Wound Depth (cm) 1 cm 09/02/2013  3:00 PM  Margins Unattached edges (unapproximated) 09/05/2013  9:50 AM  Drainage Amount Minimal 09/05/2013  9:50 AM  Drainage Description Green;Odor 09/05/2013  9:50 AM  Treatment Debridement (Selective);Hydrotherapy (Pulse lavage);Packing (Saline gauze) 09/05/2013  9:50 AM   Hydrotherapy Pulsed lavage therapy - wound location: sacrum Pulsed Lavage with Suction (psi): 8 psi (to 12) Pulsed Lavage with Suction - Normal Saline Used: 1000 mL Pulsed Lavage Tip: Tip with splash shield Selective Debridement Selective Debridement - Location: sacrum Selective Debridement - Tools Used: Forceps;Scalpel Selective  Debridement - Tissue Removed: black and yellow necrotic tissue   Wound Assessment and Plan  Wound Therapy - Assess/Plan/Recommendations Wound Therapy - Clinical Statement: Continue to remove necrotic tissue. Blue, green drainage with sweet odor present. Hydrotherapy Plan: Debridement;Dressing change;Patient/family education;Pulsatile lavage with suction;Ultrasonic wound therapy '@35'  KHz (+/- 3 KHz) Wound Therapy - Frequency: 6X / week Wound Therapy - Follow Up Recommendations: Skilled nursing facility;[place] (If no hydrotherapy available at SNF recommend continued dres) Wound Plan: See plan  Wound Therapy Goals- Improve the function of patient's integumentary system by progressing the wound(s) through the phases of wound healing (inflammation - proliferation - remodeling) by: Decrease Necrotic Tissue to: 60 Decrease Necrotic Tissue - Progress: Progressing toward goal Increase Granulation Tissue to: 40 Increase Granulation Tissue - Progress: Progressing toward goal Improve Drainage Characteristics: Min;Serous Improve Drainage Characteristics - Progress: Not progressing  Goals will be updated until maximal potential achieved or discharge criteria met.  Discharge criteria: when goals achieved, discharge from hospital, MD decision/surgical intervention, no progress towards goals, refusal/missing three consecutive treatments without notification or medical reason.  GP     [name] [name] 09/05/2013, 10:16 AM  Suanne Marker PT [phone]

## 2013-09-05 NOTE — Evaluation (Signed)
Physical Therapy Evaluation Patient Details Name: [name] [name] MRN: [id number] DOB: [birth year] Today's Date: 09/05/2013   History of Present Illness  Pt adm with encephalopathy, sepsis, and sacral ulcer. Pt with advanced MS with secondary dementia.  Clinical Impression  Pt with severe mobility limitations due to MS. Recommend follow up PT at SNF to continue attempts to mobilize pt.    Follow Up Recommendations SNF    Equipment Recommendations  None recommended by PT    Recommendations for Other Services       Precautions / Restrictions Precautions Precautions: Fall      Mobility  Bed Mobility Overal bed mobility: Needs Assistance Bed Mobility: Rolling Rolling: Total assist         General bed mobility comments: Pt refused to mobilize to EOB.  Transfers                    Ambulation/Gait                Stairs            Wheelchair Mobility    Modified Rankin (Stroke Patients Only)       Balance                                             Pertinent Vitals/Pain Pain Assessment: Faces Faces Pain Scale: Hurts little more Pain Location: bilateral knees Pain Intervention(s): Limited activity within patient's tolerance;Repositioned    Home Living Family/patient expects to be discharged to:: Skilled nursing facility Living Arrangements: Children                    Prior Function Level of Independence: Needs assistance   Gait / Transfers Assistance Needed: Per chart pt had become bedridden recently. Nonambulatory > 2 months.           Hand Dominance   Dominant Hand: Right    Extremity/Trunk Assessment   Upper Extremity Assessment: RUE deficits/detail;LUE deficits/detail RUE Deficits / Details: grossly 3-/5     LUE Deficits / Details: Shoulder flex ~75 degrees with acitive assist, Elbow <2/5   Lower Extremity Assessment: RLE deficits/detail;LLE deficits/detail RLE Deficits / Details: Able to  perform active assistive movement at hip. Knee flexion limited to ~20 degrees due to pain. No ankle movement noted. LLE Deficits / Details: Able to perform active assistive movement at hip. Knee flexion limited to ~20 degrees due to pain. No ankle movement noted.     Communication      Cognition Arousal/Alertness: Awake/alert Behavior During Therapy: WFL for tasks assessed/performed Overall Cognitive Status: No family/caregiver present to determine baseline cognitive functioning Area of Impairment: Orientation;Memory;Following commands;Problem solving Orientation Level: Time;Situation;Place   Memory: Decreased short-term memory Following Commands: Follows one step commands consistently     Problem Solving: Slow processing;Decreased initiation;Difficulty sequencing;Requires verbal cues;Requires tactile cues      General Comments      Exercises        Assessment/Plan    PT Assessment All further PT needs can be met in the next venue of care  PT Diagnosis Generalized weakness;Altered mental status   PT Problem List Decreased strength;Decreased range of motion;Decreased activity tolerance;Decreased balance;Decreased mobility;Decreased cognition  PT Treatment Interventions     PT Goals (Current goals can be found in the Care Plan section) Acute Rehab PT Goals PT Goal Formulation: No goals set, d/c  therapy    Frequency     Barriers to discharge        Co-evaluation               End of Session   Activity Tolerance: Patient limited by pain;Other (comment) (self-limiting) Patient left: in bed;with call bell/phone within reach           Time: 1455-1509 PT Time Calculation (min): 14 min   Charges:   PT Evaluation $Initial PT Evaluation Tier I: 1 Procedure     PT G Codes:          [name] [name] 09/19/2013, 4:14 PM  [facility] PT [phone]

## 2013-09-05 NOTE — Clinical Social Work Note (Signed)
CSW continues to follow this patient for d/c planning needs. CSW spoke with patient's RN Bonita Quin, regarding d/c. CSW faxed patient out to [facility] and will follow-up with patient's son once bed offers are received.   Arno Cullers Patrick-Jefferson, LCSWA Weekend Clinical Social Worker [phone]

## 2013-09-05 NOTE — Clinical Social Work Note (Signed)
CSW has contacted the following facilities: [facility], [facility], [facility], [facility]. Facilities unable to meet hydrotherapy needs. CSW reviewed patient's chart with PT recommending continued dressing if hydrotherapy is not available at SNF. CSW provided SNF with updated PT note. CSW to continue to follow-up with facilities regarding bed availability.   Tametha Banning Patrick-Jefferson, LCSWA Weekend Clinical Social Worker [phone]

## 2013-09-06 LAB — CBC WITH DIFFERENTIAL/PLATELET
BASOS ABS: 0 10*3/uL (ref 0.0–0.1)
Basophils Relative: 0 % (ref 0–1)
Eosinophils Absolute: 0.3 10*3/uL (ref 0.0–0.7)
Eosinophils Relative: 2 % (ref 0–5)
HCT: 40.1 % (ref 36.0–46.0)
Hemoglobin: 13.1 g/dL (ref 12.0–15.0)
LYMPHS PCT: 19 % (ref 12–46)
Lymphs Abs: 2.4 10*3/uL (ref 0.7–4.0)
MCH: 31 pg (ref 26.0–34.0)
MCHC: 32.7 g/dL (ref 30.0–36.0)
MCV: 94.8 fL (ref 78.0–100.0)
MONOS PCT: 12 % (ref 3–12)
Monocytes Absolute: 1.5 10*3/uL — ABNORMAL HIGH (ref 0.1–1.0)
NEUTROS PCT: 67 % (ref 43–77)
Neutro Abs: 8.5 10*3/uL — ABNORMAL HIGH (ref 1.7–7.7)
PLATELETS: 393 10*3/uL (ref 150–400)
RBC: 4.23 MIL/uL (ref 3.87–5.11)
RDW: 13 % (ref 11.5–15.5)
WBC: 12.7 10*3/uL — AB (ref 4.0–10.5)

## 2013-09-06 LAB — GLUCOSE, CAPILLARY
GLUCOSE-CAPILLARY: 87 mg/dL (ref 70–99)
GLUCOSE-CAPILLARY: 99 mg/dL (ref 70–99)
Glucose-Capillary: 118 mg/dL — ABNORMAL HIGH (ref 70–99)
Glucose-Capillary: 124 mg/dL — ABNORMAL HIGH (ref 70–99)
Glucose-Capillary: 104 mg/dL — ABNORMAL HIGH (ref 70–99)
Glucose-Capillary: 110 mg/dL — ABNORMAL HIGH (ref 70–99)

## 2013-09-06 LAB — COMPREHENSIVE METABOLIC PANEL
ALBUMIN: 2.9 g/dL — AB (ref 3.5–5.2)
ALT: 19 U/L (ref 0–35)
ANION GAP: 13 (ref 5–15)
AST: 33 U/L (ref 0–37)
Alkaline Phosphatase: 119 U/L — ABNORMAL HIGH (ref 39–117)
BILIRUBIN TOTAL: 0.7 mg/dL (ref 0.3–1.2)
BUN: 5 mg/dL — AB (ref 6–23)
CHLORIDE: 101 meq/L (ref 96–112)
CO2: 24 mEq/L (ref 19–32)
CREATININE: 0.32 mg/dL — AB (ref 0.50–1.10)
Calcium: 9.7 mg/dL (ref 8.4–10.5)
GFR calc Af Amer: >90 mL/min (ref >90)
GFR calc non Af Amer: >90 mL/min (ref >90)
Glucose, Bld: 99 mg/dL (ref 70–99)
Potassium: 5.3 mEq/L (ref 3.7–5.3)
Sodium: 138 mEq/L (ref 137–147)
Total Protein: 6.7 g/dL (ref 6.0–8.3)

## 2013-09-06 LAB — WOUND CULTURE

## 2013-09-06 MED ORDER — CIPROFLOXACIN HCL 500 MG PO TABS: 500.0000 mg | ORAL_TABLET | Freq: Two times a day (BID) | ORAL | Status: DC

## 2013-09-06 MED ORDER — CIPROFLOXACIN HCL 500 MG PO TABS
500.0000 mg | ORAL_TABLET | Freq: Two times a day (BID) | ORAL | Status: DC
  Administered 2013-09-06 – 2013-09-07 (×3): 500 mg via ORAL
  Filled 2013-09-06 (×6): qty 1

## 2013-09-06 NOTE — Discharge Summary (Signed)
Physician Discharge Summary  [name] [name] ZOX:[id number] DOB: 1970-01-29 DOA: 09/01/2013  PCP: Jeanann Lewandowsky, MD  Admit date: 09/01/2013 Discharge date: 09/06/2013  Time spent: 45 minutes  Recommendations for Outpatient Follow-up:  1. PCP in 1 week 2. Wound care-Wet to Dry, Santyl ointment, covered with ABD pads, BID 3. Follow up in Wound Center on 8/24 at 9am   Discharge Diagnoses:    Encephalopathy   Wound infection   Stage 3 decubitus wound   Advanced Multiple sclerosis   Dementia   HTN   DM   Tobacco use   H/o TIA   Hypokalemia  Discharge Condition: stable  Diet recommendation: DM/low sodium  Filed Weights   09/03/13 0420 09/03/13 0500 09/04/13 0403  Weight: 52.2 kg (115 lb 1.3 oz) 54.5 kg (120 lb 2.4 oz) 51.3 kg (113 lb 1.5 oz)    History of present illness:  Chief Complaint: encephalopathy, sepsis, sacral ulcer cellulitis  History of Present Illness:This is a 43 y.o. year old female with significant past medical history of progressive debilitating MS with secondary dementia, type 2 DM, depression presented with encephalopathy, sepsis, sacral ulcer cellulitis. Level V caveat as pt is confused on exam. No family at bedside. Per report, pt was noted to have worsening progressive confusion over the past week by her son. Per report, pt has become progressively bedridden, usually wheelchair-bound. Also with worsening sacral wound with foul smell as well as increased dark urine with worsening odor.    Hospital Course:  1. Wound infection/sacral decubitus ulcers  -Improved with Wound care, Empiric Abx-Vanc/Zosyn, transitioned to Po Abx, wound Cx polymicrobial with few pseudomonas and hence Augmentin changed to CIprofloxacin , Blood cultures remain negative -followed by Surgery/Wound RN and PT for hydrotherapy -Wound much improved with Hydrotherapy -needs continued wound care at this point, referral to the Wound Care center made. -Awaiting SNF placement at this time   2. Metabolic Encephalopathy  -Encephalopathy likely multifactorial in the setting of sepsis and baseline dementia/multiple sclerosis.  -improved -back to baseline with treatment of above  3-Dementia/Advanced multiple sclerosis  -progressive disease  -Continue antispasticity meds  -CSW following, needs SNF placement   4-HTN  -stable, cont coreg   5-DM  -SSI, A1C 5.5   6. Hypokalemia  -replaced     Procedures:  Hydrotherapy  Consultations:  CCS   Discharge Exam: Filed Vitals:   09/06/13 0448  BP: 149/99  Pulse: 77  Temp: 98.2 F (36.8 C)  Resp: 16    General: AAOx2, frail, chronically ill Cardiovascular: S1S2/RRR Respiratory: CTAB  Discharge Instructions You were cared for by a hospitalist during your hospital stay. If you have any questions about your discharge medications or the care you received while you were in the hospital after you are discharged, you can call the unit and asked to speak with the hospitalist on call if the hospitalist that took care of you is not available. Once you are discharged, your primary care physician will handle any further medical issues. Please note that NO REFILLS for any discharge medications will be authorized once you are discharged, as it is imperative that you return to your primary care physician (or establish a relationship with a primary care physician if you do not have one) for your aftercare needs so that they can reassess your need for medications and monitor your lab values.     Medication List         amLODipine 5 MG tablet  Commonly known as:  NORVASC  Take 1  tablet (5 mg total) by mouth daily.     aspirin EC 81 MG tablet  Take 1 tablet (81 mg total) by mouth daily.     baclofen 10 MG tablet  Commonly known as:  LIORESAL  Take 0.5 tablets (5 mg total) by mouth 3 (three) times daily.     ciprofloxacin 500 MG tablet  Commonly known as:  CIPRO  Take 1 tablet (500 mg total) by mouth 2 (two) times daily.  For 7 days     citalopram 10 MG tablet  Commonly known as:  CELEXA  Take 1 tablet (10 mg total) by mouth daily.     doxycycline 100 MG tablet  Commonly known as:  VIBRA-TABS  Take 1 tablet (100 mg total) by mouth every 12 (twelve) hours. For 7 days     meclizine 25 MG tablet  Commonly known as:  ANTIVERT  Take 1 tablet (25 mg total) by mouth 3 (three) times daily as needed for dizziness or nausea.     metFORMIN 500 MG tablet  Commonly known as:  GLUCOPHAGE  Take 1 tablet (500 mg total) by mouth daily with breakfast.     metoprolol tartrate 25 MG tablet  Commonly known as:  LOPRESSOR  Take 1 tablet (25 mg total) by mouth 2 (two) times daily.     saccharomyces boulardii 250 MG capsule  Commonly known as:  FLORASTOR  Take 1 capsule (250 mg total) by mouth 2 (two) times daily.       No Known Allergies Follow-up Information   Follow up with [facility] On 09/14/2013. (at 9:15am, please arrive at 9am)    Specialty:  Wound Care   Contact information:   [address] [place] [address] [phone]      Follow up with Jeanann Lewandowsky, MD. Schedule an appointment as soon as possible for a visit in 1 week.   Specialty:  Internal Medicine   Contact information:   [address] [place] [address] [phone]        The results of significant diagnostics from this hospitalization (including imaging, microbiology, ancillary and laboratory) are listed below for reference.    Significant Diagnostic Studies: Dg Chest 2 View  09/01/2013   CLINICAL DATA:  Weakness  EXAM: CHEST  2 VIEW  COMPARISON:  07/03/2013  FINDINGS: The heart size and mediastinal contours are within normal limits. Both lungs are clear. The visualized skeletal structures are unremarkable.  IMPRESSION: No active cardiopulmonary disease.   Electronically Signed   By: Ruel Favors M.D.   On: 09/01/2013 16:25   Ct Head Wo Contrast  09/01/2013   CLINICAL DATA:   Altered mental status ; multiple sclerosis  EXAM: CT HEAD WITHOUT CONTRAST  TECHNIQUE: Contiguous axial images were obtained from the base of the skull through the vertex without intravenous contrast.  COMPARISON:  Brain MRI July 03, 2013 and brain CT July 03, 2013  FINDINGS: There is mild generalized atrophy, stable. There is no demonstrable mass, hemorrhage, extra-axial fluid collection, or midline shift. Widespread decreased attenuation throughout the supratentorial white matter is consistent with known multiple sclerosis. No acute infarct is appreciable on this study. There is no obvious is new gray-white compartment lesion. Bony calvarium appears intact. The mastoid air cells are clear.  IMPRESSION: Atrophy with widespread decreased attenuation in the supratentorial white matter consistent with extensive changes of demyelination. No hemorrhage or well-defined mass is seen. There is no extra-axial fluid. Note  that intravenous contrast is necessary to assess for plaque activity.   Electronically Signed   By: Bretta BangWilliam  Woodruff M.D.   On: 09/01/2013 16:41   Ct Abdomen Pelvis W Contrast  09/01/2013   CLINICAL DATA:  Sacral ulcer ; possible abscess  EXAM: CT ABDOMEN AND PELVIS WITH CONTRAST  TECHNIQUE: Multidetector CT imaging of the abdomen and pelvis was performed using the standard protocol following bolus administration of intravenous contrast.  CONTRAST:  100mL OMNIPAQUE IOHEXOL 300 MG/ML  SOLN  COMPARISON:  None.  FINDINGS: There is increased density within the subcutaneous and deeper soft tissues at the level of the lower sacrum and coccyx. There is no discrete fluid collection. No definite sinus tract communicating with the anus is demonstrated. Within the pelvis proper the urinary bladder contains a Foley catheter. The uterus and adnexal structures are grossly normal. The sigmoid colon and rectum contain a moderate amount of stool and gas. There is no free pelvic fluid.  The liver, gallbladder, pancreas,  spleen, adrenal glands, and kidneys exhibit no acute abnormalities. There is mild fullness of the pancreatic head without evidence of a mass or inflammation. There is an 8 mm diameter hypodensity in the midpole of the left kidney most compatible with a cyst. The abdominal aorta exhibits normal caliber. The stomach and small and large bowel exhibit no acute abnormality. The lung bases are clear. The lumbar spine and bony pelvis exhibit no acute abnormalities.  IMPRESSION: 1. There are inflammatory changes consistent with cellulitis in the soft tissues over the tip of the sacrum and coccyx. No abscess is demonstrated. 2. No acute intra-abdominal or pelvic abnormality is demonstrated.   Electronically Signed   By: David  SwazilandJordan   On: 09/01/2013 19:49    Microbiology: Recent Results (from the past 240 hour(s))  WOUND CULTURE     Status: None   Collection Time    09/01/13  5:30 PM      Result Value Ref Range Status   Specimen Description WOUND PERIRECTAL   Final   Special Requests NONE   Final   Gram Stain     Final   Value: NO     WBC NO SQUAMOUS EPITHELIAL CELLS SEEN     MODERATE GRAM POSITIVE COCCI     IN PAIRS FEW GRAM POSITIVE RODS     Performed at [facility]   Culture     Final   Value: FEW PSEUDOMONAS AERUGINOSA     Performed at [facility]   Report Status 09/06/2013 FINAL   Final   Organism ID, Bacteria PSEUDOMONAS AERUGINOSA   Final  URINE CULTURE     Status: None   Collection Time    09/01/13  5:35 PM      Result Value Ref Range Status   Specimen Description URINE, CATHETERIZED   Final   Special Requests NONE   Final   Culture  Setup Time     Final   Value: 09/01/2013 23:22     Performed at [facility]   Colony Count     Final   Value: 40,000 COLONIES/ML     Performed at [facility]   Culture     Final   Value: Multiple bacterial morphotypes present, none predominant. Suggest appropriate recollection if clinically indicated.     Performed  at [facility]   Report Status 09/02/2013 FINAL   Final  CULTURE, BLOOD (ROUTINE X 2)     Status: None   Collection Time  09/01/13  6:00 PM      Result Value Ref Range Status   Specimen Description BLOOD ARM LEFT   Final   Special Requests BOTTLES DRAWN AEROBIC AND ANAEROBIC 10CC   Final   Culture  Setup Time     Final   Value: 09/01/2013 23:02     Performed at [facility]   Culture     Final   Value:        BLOOD CULTURE RECEIVED NO GROWTH TO DATE CULTURE WILL BE HELD FOR 5 DAYS BEFORE ISSUING A FINAL NEGATIVE REPORT     Performed at [facility]   Report Status PENDING   Incomplete  CULTURE, BLOOD (ROUTINE X 2)     Status: None   Collection Time    09/01/13  6:10 PM      Result Value Ref Range Status   Specimen Description BLOOD ARM LEFT   Final   Special Requests BOTTLES DRAWN AEROBIC ONLY 10CC   Final   Culture  Setup Time     Final   Value: 09/01/2013 23:01     Performed at [facility]   Culture     Final   Value:        BLOOD CULTURE RECEIVED NO GROWTH TO DATE CULTURE WILL BE HELD FOR 5 DAYS BEFORE ISSUING A FINAL NEGATIVE REPORT     Performed at [facility]   Report Status PENDING   Incomplete     Labs: Basic Metabolic Panel:  Recent Labs Lab 09/01/13 1810 09/02/13 0542 09/03/13 0601 09/04/13 0524 09/05/13 0535 09/06/13 0630  NA  --  141 141 142 145 138  K 2.9* 2.7* 3.5* 3.3* 4.1 5.3  CL  --  103 105 103 107 101  CO2  --  24 24 22 28 24   GLUCOSE  --  97 98 120* 106* 99  BUN  --  <3* 3* <3* 5* 5*  CREATININE  --  0.28* 0.34* 0.30* 0.39* 0.32*  CALCIUM  --  8.6 8.8 8.9 9.6 9.7  MG 1.8  --   --   --   --   --    Liver Function Tests:  Recent Labs Lab 09/02/13 0542 09/03/13 0601 09/04/13 0524 09/05/13 0535 09/06/13 0630  AST 15 12 16 18  33  ALT 12 10 12 12 19   ALKPHOS 108 100 113 110 119*  BILITOT 0.9 1.0 1.0 0.7 0.7  PROT 6.2 5.9* 6.6 6.4 6.7  ALBUMIN 2.6* 2.5* 2.8* 2.8* 2.9*   No results found for  this basename: LIPASE, AMYLASE,  in the last 168 hours No results found for this basename: AMMONIA,  in the last 168 hours CBC:  Recent Labs Lab 09/02/13 0542 09/03/13 0601 09/04/13 0524 09/05/13 0535 09/06/13 0630  WBC 14.8* 11.3* 13.4* 11.0* 12.7*  NEUTROABS 10.8* 8.2* 11.3* 7.3 8.5*  HGB 12.4 10.9* 11.7* 11.6* 13.1  HCT 36.7 32.6* 34.7* 35.1* 40.1  MCV 92.7 94.5 93.8 94.4 94.8  PLT 568* 552* 570* 473* 393   Cardiac Enzymes: No results found for this basename: CKTOTAL, CKMB, CKMBINDEX, TROPONINI,  in the last 168 hours BNP: BNP (last 3 results) No results found for this basename: PROBNP,  in the last 8760 hours CBG:  Recent Labs Lab 09/05/13 1614 09/05/13 2041 09/05/13 2346 09/06/13 0445 09/06/13 0735  GLUCAP 101* 119* 100* 87 118*       Signed:  Merri Dimaano  [facility] 09/06/2013, 11:08 AM

## 2013-09-06 NOTE — Progress Notes (Signed)
Patient has orders for daily weights. Patient's most recent weight is 51.3 kg as of 09/04/13.  Current bed is not recording patient's weight. MD Jomarie Longs aware. Patient anticipating d/c to SNF today.

## 2013-09-06 NOTE — Clinical Social Work Note (Signed)
CSW continues to follow this patient for d/c planning needs. CSW spoke with Tammy of [facility], per [place], facility will not have supplies needed to accommodate patient until Monday. CSW explained patient does not require hydrotherapy, only dressing changes. Tammy stated facility wants to make sure can meet the needs of patient before accepting patient. CSW spoke with [employer] of [facility] [employer]. Per Psychiatric nurse of Nursing Albin Felling) will review patient's clinicals on Monday. CSW spoke with Amy of [facility]. Per Amy, Admissions Director was unable to be contacted today and will have to review patient's clinicals on Monday. CSW spoke with Corrie Dandy of [facility], who stated facility could accept patient on Monday. CSW spoke with Toniann Fail of [facility] who stated facility could accept patient on Monday. CSW unable to speak with supervisor or weekend coordinator at [employer], or [employer]. Dr. Jomarie Longs made aware. Patient's RN Bonita Quin, made aware. CSW unable to speak with patient's son. CSW to follow tomorrow with d/c to SNF facility.  Kaily Wragg Patrick-Jefferson, LCSWA Weekend Clinical Social Worker [phone]

## 2013-09-06 NOTE — Clinical Social Work Placement (Signed)
Clinical Social Work Department CLINICAL SOCIAL WORK PLACEMENT NOTE 09/06/2013  Patient:  SUI, CRAGGS  Account Number:  [id number] Admit date:  09/01/2013  Clinical Social Worker:  Vivi Barrack, LCSWA  Date/time:  09/06/2013 06:54 PM  Clinical Social Work is seeking post-discharge placement for this patient at the following level of care:   SKILLED NURSING   (*CSW will update this form in Epic as items are completed)   09/01/2013  Patient/family provided with [facility] Department of Clinical Social Work's list of facilities offering this level of care within the geographic area requested by the patient (or if unable, by the patient's family).  09/01/2013  Patient/family informed of their freedom to choose among providers that offer the needed level of care, that participate in Medicare, Medicaid or managed care program needed by the patient, have an available bed and are willing to accept the patient.  09/01/2013  Patient/family informed of [facility]' ownership interest in [facility], as well as of the fact that they are under no obligation to receive care at this facility.  PASARR submitted to EDS on 09/05/2013 PASARR number received on 09/05/2013  FL2 transmitted to all facilities in geographic area requested by pt/family on  09/05/2013 FL2 transmitted to all facilities within larger geographic area on   Patient informed that his/her managed care company has contracts with or will negotiate with  certain facilities, including the following:     Patient/family informed of bed offers received:  09/05/2013 Patient chooses bed at  Physician recommends and patient chooses bed at    Patient to be transferred to  on   Patient to be transferred to facility by  Patient and family notified of transfer on  Name of family member notified:    The following physician request were entered in Epic:   Additional Comments:  Chidera Dearcos Patrick-Jefferson,  LCSWA Weekend Clinical Social Worker [phone]

## 2013-09-07 LAB — CBC WITH DIFFERENTIAL/PLATELET
BASOS PCT: 0 % (ref 0–1)
Basophils Absolute: 0 10*3/uL (ref 0.0–0.1)
EOS ABS: 0.2 10*3/uL (ref 0.0–0.7)
Eosinophils Relative: 2 % (ref 0–5)
HCT: 40.5 % (ref 36.0–46.0)
Hemoglobin: 13.5 g/dL (ref 12.0–15.0)
Lymphocytes Relative: 22 % (ref 12–46)
Lymphs Abs: 3 10*3/uL (ref 0.7–4.0)
MCH: 31.6 pg (ref 26.0–34.0)
MCHC: 33.3 g/dL (ref 30.0–36.0)
MCV: 94.8 fL (ref 78.0–100.0)
MONO ABS: 1.4 10*3/uL — AB (ref 0.1–1.0)
Monocytes Relative: 10 % (ref 3–12)
NEUTROS ABS: 8.9 10*3/uL — AB (ref 1.7–7.7)
NEUTROS PCT: 66 % (ref 43–77)
Platelets: 417 10*3/uL — ABNORMAL HIGH (ref 150–400)
RBC: 4.27 MIL/uL (ref 3.87–5.11)
RDW: 13.1 % (ref 11.5–15.5)
WBC: 13.5 10*3/uL — ABNORMAL HIGH (ref 4.0–10.5)

## 2013-09-07 LAB — COMPREHENSIVE METABOLIC PANEL
ALT: 21 U/L (ref 0–35)
ANION GAP: 15 (ref 5–15)
AST: 30 U/L (ref 0–37)
Albumin: 3 g/dL — ABNORMAL LOW (ref 3.5–5.2)
Alkaline Phosphatase: 134 U/L — ABNORMAL HIGH (ref 39–117)
BUN: 5 mg/dL — AB (ref 6–23)
CO2: 24 meq/L (ref 19–32)
Calcium: 9.7 mg/dL (ref 8.4–10.5)
Chloride: 103 mEq/L (ref 96–112)
Creatinine, Ser: 0.31 mg/dL — ABNORMAL LOW (ref 0.50–1.10)
GFR calc non Af Amer: >90 mL/min (ref >90)
GLUCOSE: 100 mg/dL — AB (ref 70–99)
POTASSIUM: 4.7 meq/L (ref 3.7–5.3)
Sodium: 142 mEq/L (ref 137–147)
Total Bilirubin: 0.6 mg/dL (ref 0.3–1.2)
Total Protein: 7 g/dL (ref 6.0–8.3)

## 2013-09-07 LAB — CULTURE, BLOOD (ROUTINE X 2)
Culture: NO GROWTH
Culture: NO GROWTH

## 2013-09-07 LAB — GLUCOSE, CAPILLARY
Glucose-Capillary: 91 mg/dL (ref 70–99)
Glucose-Capillary: 122 mg/dL — ABNORMAL HIGH (ref 70–99)
Glucose-Capillary: 112 mg/dL — ABNORMAL HIGH (ref 70–99)

## 2013-09-07 NOTE — Progress Notes (Signed)
CARE MANAGEMENT NOTE 09/07/2013  Patient:  [name] [name],[name] [name]   Account Number:  [id number]  Date Initiated:  09/02/2013  Documentation initiated by:  Letha CapeAYLOR,DEBORAH  Subjective/Objective Assessment:   dx AMS, stage 3 sacral ulcer     Action/Plan:   pt for hydrotherapy   Anticipated DC Date:  09/04/2013   Anticipated DC Plan:  SKILLED NURSING FACILITY  In-house referral  Clinical Social Worker      DC Planning Services  CM consult      Choice offered to / List presented to:             Status of service:  Completed, signed off Medicare Important Message given?  YES (If response is "NO", the following Medicare IM given date fields will be blank) Date Medicare IM given:  09/07/2013 Medicare IM given by:  Letha CapeAYLOR,DEBORAH Date Additional Medicare IM given:   Additional Medicare IM given by:    Discharge Disposition:  SKILLED NURSING FACILITY  Per UR Regulation:  Reviewed for med. necessity/level of care/duration of stay  If discussed at Long Length of Stay Meetings, dates discussed:    Comments:

## 2013-09-07 NOTE — Progress Notes (Signed)
Nsg Discharge Note  Admit Date:  09/01/2013 Discharge date: 09/07/2013   Elliyah K Casteneda to be D/C'd Nursing Home per MD order.  AVS completed.  Copy for chart, and copy for patient signed, and dated. Patient/caregiver able to verbalize understanding.  Discharge Medication:   Medication List         amLODipine 5 MG tablet  Commonly known as:  NORVASC  Take 1 tablet (5 mg total) by mouth daily.     aspirin EC 81 MG tablet  Take 1 tablet (81 mg total) by mouth daily.     baclofen 10 MG tablet  Commonly known as:  LIORESAL  Take 0.5 tablets (5 mg total) by mouth 3 (three) times daily.     ciprofloxacin 500 MG tablet  Commonly known as:  CIPRO  Take 1 tablet (500 mg total) by mouth 2 (two) times daily. For 7 days     citalopram 10 MG tablet  Commonly known as:  CELEXA  Take 1 tablet (10 mg total) by mouth daily.     doxycycline 100 MG tablet  Commonly known as:  VIBRA-TABS  Take 1 tablet (100 mg total) by mouth every 12 (twelve) hours. For 7 days     meclizine 25 MG tablet  Commonly known as:  ANTIVERT  Take 1 tablet (25 mg total) by mouth 3 (three) times daily as needed for dizziness or nausea.     metFORMIN 500 MG tablet  Commonly known as:  GLUCOPHAGE  Take 1 tablet (500 mg total) by mouth daily with breakfast.     metoprolol tartrate 25 MG tablet  Commonly known as:  LOPRESSOR  Take 1 tablet (25 mg total) by mouth 2 (two) times daily.     saccharomyces boulardii 250 MG capsule  Commonly known as:  FLORASTOR  Take 1 capsule (250 mg total) by mouth 2 (two) times daily.        Discharge Assessment: Filed Vitals:   09/07/13 1432  BP: 127/94  Pulse: 98  Temp:   Resp: 18  All pressure ulcers noted, new foam applied to Right foot, and both elbows. Therapy tended to stage III, may refer to notes.  IV catheter discontinued intact. Site without signs and symptoms of complications - no redness or edema noted at insertion site, patient denies c/o pain - only slight  tenderness at site.  Dressing with slight pressure applied.  D/c Instructions-Education: Discharge instructions given to patient/family with verbalized understanding. D/c education completed with patient/family including follow up instructions, medication list, d/c activities limitations if indicated, with other d/c instructions as indicated by MD - patient able to verbalize understanding, all questions fully answered. Patient instructed to return to ED, call 911, or call MD for any changes in condition.  Patient escorted via EMS to SNF, report called into [place].  Kern Reap, RN 09/07/2013 2:46 PM

## 2013-09-07 NOTE — Progress Notes (Signed)
Physical Therapy Wound Treatment Patient Details  Name: [name] [name] MRN: [id number] Date of Birth: 12-21-1970  Today's Date: 09/07/2013 Time: 4742-5956 Time Calculation (min): 38 min  Subjective  Subjective: Ohh.... Patient and Family Stated Goals: pt did not participate, but agreed needed to get healed up.  Pain Score: Pain Score: Asleep  Wound Assessment  Pressure Ulcer 09/01/13 Unstageable - Full thickness tissue loss in which the base of the ulcer is covered by slough (yellow, tan, gray, green or brown) and/or eschar (tan, brown or black) in the wound bed. (Active)  Dressing Type ABD;Gauze (Comment);Moist to dry;Paper tape 09/07/2013  1:52 PM  Dressing Clean;Dry;Intact 09/07/2013  1:52 PM  Dressing Change Frequency Other (Comment) 09/07/2013  1:52 PM  State of Healing Eschar 09/07/2013  1:52 PM  Site / Wound Assessment Yellow;Red;Brown 09/07/2013  1:52 PM  % Wound base Red or Granulating 20% 09/07/2013  1:52 PM  % Wound base Yellow 60% 09/07/2013  1:52 PM  % Wound base Black 0% 09/07/2013  1:52 PM  % Wound base Other (Comment) 20% 09/07/2013  1:52 PM  Peri-wound Assessment Intact 09/07/2013  1:52 PM  Wound Length (cm) 10 cm 09/02/2013  3:00 PM  Wound Width (cm) 15 cm 09/02/2013  3:00 PM  Wound Depth (cm) 1 cm 09/02/2013  3:00 PM  Margins Unattached edges (unapproximated) 09/07/2013  1:52 PM  Drainage Amount Minimal 09/07/2013  1:52 PM  Drainage Description Serosanguineous 09/07/2013  1:52 PM  Treatment Cleansed;Hydrotherapy (Pulse lavage);Debridement (Selective);Packing (Saline gauze) 09/07/2013  1:52 PM     Pressure Ulcer 09/01/13 Stage II -  Partial thickness loss of dermis presenting as a shallow open ulcer with a red, pink wound bed without slough. (Active)  Dressing Type Foam 09/07/2013  8:08 AM  Dressing Clean;Dry;Intact 09/07/2013  8:08 AM  Dressing Change Frequency PRN 09/07/2013  8:08 AM  Site / Wound Assessment Dressing in place / Unable to assess 09/07/2013  8:08 AM  % Wound base  Red or Granulating 75% 09/02/2013  7:48 AM  % Wound base Yellow 25% 09/02/2013  7:48 AM  Peri-wound Assessment Intact 09/06/2013  4:23 PM  Wound Length (cm) 1.2 cm 09/02/2013  7:48 AM  Wound Width (cm) 1 cm 09/02/2013  7:48 AM  Wound Depth (cm) 0 cm 09/02/2013  7:48 AM  Tunneling (cm) 0 09/02/2013  7:48 AM  Undermining (cm) 0 09/02/2013  7:48 AM  Margins Unattached edges (unapproximated) 09/06/2013  4:23 PM  Drainage Amount None 09/06/2013  4:23 PM  Drainage Description Serosanguineous 09/01/2013 11:45 AM     Pressure Ulcer 09/01/13 Deep Tissue Injury - Purple or maroon localized area of discolored intact skin or blood-filled blister due to damage of underlying soft tissue from pressure and/or shear. (Active)  Dressing Type Foam 09/07/2013  8:08 AM  Dressing Dry;Clean;Intact 09/07/2013  8:08 AM  Dressing Change Frequency PRN 09/07/2013  8:08 AM  Site / Wound Assessment Dressing in place / Unable to assess 09/07/2013  8:08 AM  Peri-wound Assessment Intact 09/06/2013  4:23 PM  Wound Length (cm) 2.5 cm 09/02/2013  7:48 AM  Wound Width (cm) 3 cm 09/02/2013  7:48 AM  Wound Depth (cm) 0 cm 09/02/2013  7:48 AM  Tunneling (cm) 0 09/02/2013  7:48 AM  Undermining (cm) 0 09/02/2013  7:48 AM  Margins Attached edges (approximated) 09/06/2013  4:23 PM  Drainage Amount None 09/06/2013  4:23 PM     Pressure Ulcer 09/01/13 Stage II -  Partial thickness loss of dermis presenting as a shallow  open ulcer with a red, pink wound bed without slough. (Active)  Dressing Type Foam 09/07/2013  8:08 AM  Dressing Clean;Dry;Intact 09/07/2013  8:08 AM  Dressing Change Frequency PRN 09/07/2013  8:08 AM  State of Healing Early/partial granulation 09/01/2013 11:45 AM  Site / Wound Assessment Dressing in place / Unable to assess 09/07/2013  8:08 AM  Peri-wound Assessment Intact 09/06/2013  4:23 PM  Wound Length (cm) 0.6 cm 09/02/2013  7:48 AM  Wound Width (cm) 0.6 cm 09/02/2013  7:48 AM  Wound Depth (cm) 0 cm 09/02/2013  7:48 AM  Tunneling (cm) 0  09/02/2013  7:48 AM  Undermining (cm) 0 09/02/2013  7:48 AM  Margins Unattached edges (unapproximated) 09/06/2013  4:23 PM  Drainage Amount None 09/06/2013  4:23 PM  Drainage Description Serosanguineous 09/01/2013 11:45 AM     Pressure Ulcer 09/01/13 Stage II -  Partial thickness loss of dermis presenting as a shallow open ulcer with a red, pink wound bed without slough. (Active)  Dressing Type Foam 09/07/2013  8:08 AM  Dressing Clean;Intact;Dry 09/07/2013  8:08 AM  Dressing Change Frequency PRN 09/07/2013  8:08 AM  State of Healing Early/partial granulation 09/01/2013 11:45 AM  Site / Wound Assessment Dressing in place / Unable to assess 09/07/2013  8:08 AM  % Wound base Red or Granulating 10% 09/01/2013 11:45 AM  Peri-wound Assessment Intact 09/06/2013  4:23 PM  Wound Length (cm) 1 cm 09/02/2013  7:48 AM  Wound Width (cm) 0.8 cm 09/02/2013  7:48 AM  Wound Depth (cm) 0 cm 09/02/2013  7:48 AM  Tunneling (cm) 0 09/02/2013  7:48 AM  Undermining (cm) 0 09/02/2013  7:48 AM  Margins Unattached edges (unapproximated) 09/02/2013  7:48 AM  Drainage Amount None 09/06/2013  4:23 PM  Drainage Description Sanguineous;Serosanguineous 09/01/2013 11:45 AM     Pressure Ulcer 09/02/13 Stage II -  Partial thickness loss of dermis presenting as a shallow open ulcer with a red, pink wound bed without slough. (Active)  Dressing Type Foam 09/07/2013  8:08 AM  Dressing Clean;Dry;Intact 09/07/2013  8:08 AM  Dressing Change Frequency PRN 09/07/2013  8:08 AM  Site / Wound Assessment Dressing in place / Unable to assess 09/07/2013  8:08 AM  Peri-wound Assessment Intact 09/06/2013  4:23 PM  Wound Length (cm) 0.8 cm 09/02/2013  7:48 AM  Wound Width (cm) 1.4 cm 09/02/2013  7:48 AM  Wound Depth (cm) 0 cm 09/02/2013  7:48 AM  Tunneling (cm) 0 09/02/2013  7:48 AM  Undermining (cm) 0 09/02/2013  7:48 AM  Margins Unattached edges (unapproximated) 09/06/2013  4:23 PM  Drainage Amount None 09/06/2013  4:23 PM     Wound / Incision (Open or  Dehisced) 09/02/13 Other (Comment) Neck Mid;Lower skin tear (Active)  Dressing Type None 09/07/2013  8:08 AM  Site / Wound Assessment Pale;Pink 09/07/2013  8:08 AM  % Wound base Red or Granulating 100% 09/07/2013  8:08 AM  Peri-wound Assessment Intact 09/06/2013  4:23 PM  Wound Length (cm) 0.4 cm 09/02/2013  7:48 AM  Wound Width (cm) 1.5 cm 09/02/2013  7:48 AM  Wound Depth (cm) 0 cm 09/02/2013  7:48 AM  Tunneling (cm) 0 09/06/2013  4:23 PM  Undermining (cm) 0 09/06/2013  4:23 PM  Margins Attached edges (approximated) 09/06/2013  4:23 PM  Closure None 09/06/2013  4:23 PM  Drainage Amount None 09/06/2013  4:23 PM   Hydrotherapy Pulsed lavage therapy - wound location: sacrum Pulsed Lavage with Suction (psi): 8 psi Pulsed Lavage with Suction - Normal  Saline Used: 1000 mL Pulsed Lavage Tip: Tip with splash shield Selective Debridement Selective Debridement - Location: sacrum Selective Debridement - Tools Used: Forceps;Scalpel Selective Debridement - Tissue Removed:  (yellow eschar/slough and non viable adipost)   Wound Assessment and Plan  Wound Therapy - Assess/Plan/Recommendations Wound Therapy - Clinical Statement: Continue debridement to get to healthy tissue. Wound Therapy - Functional Problem List: MS,  decreased ability to mobilize Factors Delaying/Impairing Wound Healing: Immobility;Infection - systemic/local Hydrotherapy Plan: Debridement;Dressing change;Patient/family education;Pulsatile lavage with suction;Ultrasonic wound therapy '@35'  KHz (+/- 3 KHz) Wound Therapy - Frequency: 6X / week Wound Therapy - Follow Up Recommendations: Skilled nursing facility;Wound Care Center Wound Plan: See plan  Wound Therapy Goals- Improve the function of patient's integumentary system by progressing the wound(s) through the phases of wound healing (inflammation - proliferation - remodeling) by: Decrease Necrotic Tissue to: 60 Decrease Necrotic Tissue - Progress: Met Increase Granulation Tissue to:  40 Increase Granulation Tissue - Progress: Progressing toward goal Improve Drainage Characteristics: Min;Serous Improve Drainage Characteristics - Progress: Progressing toward goal Goals/treatment plan/discharge plan were made with and agreed upon by patient/family: Yes Time For Goal Achievement: 7 days Wound Therapy - Potential for Goals: Good  Goals will be updated until maximal potential achieved or discharge criteria met.  Discharge criteria: when goals achieved, discharge from hospital, MD decision/surgical intervention, no progress towards goals, refusal/missing three consecutive treatments without notification or medical reason.  GP     Sherrice Creekmore, Tessie Fass 09/07/2013, 2:00 PM 09/07/2013  Donnella Sham, PT [phone]  (pager)

## 2013-09-07 NOTE — Progress Notes (Signed)
No changes from DC summary 8/16  Zannie CovePreetha Catalea Labrecque, MD [phone]

## 2013-09-07 NOTE — Clinical Social Work Note (Signed)
Patient's son choose [facility]. [facility].  Roddie McBryant Ami Mally MSW, [place] Park[place], [place], [phone]

## 2013-09-07 NOTE — Clinical Social Work Placement (Addendum)
Clinical Social Work Department CLINICAL SOCIAL WORK PLACEMENT NOTE 09/07/2013  Patient:  [name] [name], [name] [name]  Account Number:  [id number] Admit date:  09/01/2013  Clinical Social Worker:  Vivi Barrack, LCSWA  Date/time:  09/06/2013 06:54 PM  Clinical Social Work is seeking post-discharge placement for this patient at the following level of care:   SKILLED NURSING   (*CSW will update this form in Epic as items are completed)   09/01/2013  Patient/family provided with [facility] Department of Clinical Social Work's list of facilities offering this level of care within the geographic area requested by the patient (or if unable, by the patient's family).  09/01/2013  Patient/family informed of their freedom to choose among providers that offer the needed level of care, that participate in Medicare, Medicaid or managed care program needed by the patient, have an available bed and are willing to accept the patient.  09/01/2013  Patient/family informed of [facility]' ownership interest in [facility], as well as of the fact that they are under no obligation to receive care at this facility.  PASARR submitted to EDS on 09/05/2013 PASARR number received on 09/05/2013  FL2 transmitted to all facilities in geographic area requested by pt/family on  09/05/2013 FL2 transmitted to all facilities within larger geographic area on   Patient informed that his/her managed care company has contracts with or will negotiate with  certain facilities, including the following:     Patient/family informed of bed offers received:  09/05/2013 Patient chooses bed at [facility] Physician recommends and patient chooses bed at    Patient to be transferred to [facility] on  09/07/2013 Patient to be transferred to facility by Ambulance Patient and family notified of transfer on 09/07/2013 Name of family member notified:  Franky Macho  The following physician  request were entered in Epic:   Additional Comments: Per MD patient ready for DC to [facility]. RN, patient, patient's family, and facility notified of DC. RN given number for report. DC packet on chart. AMbulance transport requested for patient. CSW signing off.     Roddie Mc MSW, [place], [place], [phone]

## 2013-09-08 NOTE — ED Provider Notes (Signed)
I saw and evaluated the patient, reviewed the resident's note and I agree with the findings and plan.   EKG Interpretation None     Dementia and MS with several days AMS, decub ulcer, suspected sepsis.  CRITICAL CARE Performed by: Hurman Horn Total critical care time: Critical care time was exclusive of separately billable procedures and treating other patients. Critical care was necessary to treat or prevent imminent or life-threatening deterioration. Critical care was time spent personally by me on the following activities: development of treatment plan with patient and/or surrogate as well as nursing, discussions with consultants, evaluation of patient's response to treatment, examination of patient, obtaining history from patient or surrogate, ordering and performing treatments and interventions, ordering and review of laboratory studies, ordering and review of radiographic studies, pulse oximetry and re-evaluation of patient's condition.  Hurman Horn, MD 09/08/13 1324

## 2013-09-14 ENCOUNTER — Encounter (HOSPITAL_BASED_OUTPATIENT_CLINIC_OR_DEPARTMENT_OTHER): Payer: Medicare Other

## 2013-09-29 ENCOUNTER — Other Ambulatory Visit (HOSPITAL_COMMUNITY): Payer: Self-pay | Admitting: Internal Medicine

## 2013-09-29 ENCOUNTER — Other Ambulatory Visit: Payer: Self-pay | Admitting: Radiology

## 2013-09-29 DIAGNOSIS — R6251 Failure to thrive (child): Secondary | ICD-10-CM

## 2013-09-29 DIAGNOSIS — R1310 Dysphagia, unspecified: Secondary | ICD-10-CM

## 2013-09-30 ENCOUNTER — Other Ambulatory Visit (HOSPITAL_COMMUNITY): Payer: Self-pay | Admitting: Internal Medicine

## 2013-09-30 ENCOUNTER — Ambulatory Visit (HOSPITAL_COMMUNITY)
Admission: RE | Admit: 2013-09-30 | Discharge: 2013-09-30 | Disposition: A | Discharge status: Home / Self Care | Payer: Medicare Other | Source: Ambulatory Visit | Attending: Internal Medicine | Admitting: Internal Medicine

## 2013-09-30 ENCOUNTER — Encounter (HOSPITAL_COMMUNITY): Payer: Self-pay

## 2013-09-30 DIAGNOSIS — L8990 Pressure ulcer of unspecified site, unspecified stage: Secondary | ICD-10-CM | POA: Diagnosis not present

## 2013-09-30 DIAGNOSIS — R1310 Dysphagia, unspecified: Secondary | ICD-10-CM | POA: Insufficient documentation

## 2013-09-30 DIAGNOSIS — F0390 Unspecified dementia without behavioral disturbance: Secondary | ICD-10-CM | POA: Insufficient documentation

## 2013-09-30 DIAGNOSIS — R6251 Failure to thrive (child): Secondary | ICD-10-CM

## 2013-09-30 DIAGNOSIS — E785 Hyperlipidemia, unspecified: Secondary | ICD-10-CM | POA: Insufficient documentation

## 2013-09-30 DIAGNOSIS — Z8673 Personal history of transient ischemic attack (TIA), and cerebral infarction without residual deficits: Secondary | ICD-10-CM | POA: Insufficient documentation

## 2013-09-30 DIAGNOSIS — Z87891 Personal history of nicotine dependence: Secondary | ICD-10-CM | POA: Diagnosis not present

## 2013-09-30 DIAGNOSIS — E119 Type 2 diabetes mellitus without complications: Secondary | ICD-10-CM | POA: Diagnosis not present

## 2013-09-30 DIAGNOSIS — I10 Essential (primary) hypertension: Secondary | ICD-10-CM | POA: Diagnosis not present

## 2013-09-30 DIAGNOSIS — L89153 Pressure ulcer of sacral region, stage 3: Secondary | ICD-10-CM

## 2013-09-30 DIAGNOSIS — L89109 Pressure ulcer of unspecified part of back, unspecified stage: Secondary | ICD-10-CM | POA: Diagnosis not present

## 2013-09-30 DIAGNOSIS — G35 Multiple sclerosis: Secondary | ICD-10-CM | POA: Insufficient documentation

## 2013-09-30 DIAGNOSIS — E46 Unspecified protein-calorie malnutrition: Secondary | ICD-10-CM | POA: Insufficient documentation

## 2013-09-30 LAB — BASIC METABOLIC PANEL
Anion gap: 16 — ABNORMAL HIGH (ref 5–15)
BUN: 8 mg/dL (ref 6–23)
CO2: 22 mEq/L (ref 19–32)
Calcium: 9.2 mg/dL (ref 8.4–10.5)
Chloride: 105 mEq/L (ref 96–112)
Creatinine, Ser: 0.31 mg/dL — ABNORMAL LOW (ref 0.50–1.10)
GFR calc Af Amer: >90 mL/min (ref >90)
Glucose, Bld: 100 mg/dL — ABNORMAL HIGH (ref 70–99)
Potassium: 4.1 mEq/L (ref 3.7–5.3)
Sodium: 143 mEq/L (ref 137–147)

## 2013-09-30 LAB — APTT: aPTT: 29 seconds (ref 24–37)

## 2013-09-30 LAB — CBC
HEMATOCRIT: 38.1 % (ref 36.0–46.0)
Hemoglobin: 12.8 g/dL (ref 12.0–15.0)
MCH: 31 pg (ref 26.0–34.0)
MCHC: 33.6 g/dL (ref 30.0–36.0)
MCV: 92.3 fL (ref 78.0–100.0)
PLATELETS: 473 10*3/uL — AB (ref 150–400)
RBC: 4.13 MIL/uL (ref 3.87–5.11)
RDW: 14.1 % (ref 11.5–15.5)
WBC: 8.8 10*3/uL (ref 4.0–10.5)

## 2013-09-30 LAB — PROTIME-INR
INR: 1.15 (ref 0.00–1.49)
Prothrombin Time: 14.7 seconds (ref 11.6–15.2)

## 2013-09-30 MED ORDER — GLUCAGON HCL RDNA (DIAGNOSTIC) 1 MG IJ SOLR
INTRAMUSCULAR | Status: AC
  Filled 2013-09-30: qty 1

## 2013-09-30 MED ORDER — SODIUM CHLORIDE 0.9 % IV SOLN: Freq: Once | INTRAVENOUS | Status: DC

## 2013-09-30 MED ORDER — MIDAZOLAM HCL 2 MG/2ML IJ SOLN
INTRAMUSCULAR | Status: AC
  Filled 2013-09-30: qty 2

## 2013-09-30 MED ORDER — FENTANYL CITRATE 0.05 MG/ML IJ SOLN
INTRAMUSCULAR | Status: AC
  Filled 2013-09-30: qty 2

## 2013-09-30 MED ORDER — LIDOCAINE VISCOUS 2 % MT SOLN
OROMUCOSAL | Status: AC
  Filled 2013-09-30: qty 15

## 2013-09-30 MED ORDER — CEFAZOLIN SODIUM-DEXTROSE 2-3 GM-% IV SOLR
INTRAVENOUS | Status: AC
  Filled 2013-09-30: qty 50

## 2013-09-30 MED ORDER — CEFAZOLIN SODIUM-DEXTROSE 2-3 GM-% IV SOLR: 2.0000 g | Freq: Once | INTRAVENOUS | Status: DC

## 2013-09-30 MED ORDER — MIDAZOLAM HCL 2 MG/2ML IJ SOLN
INTRAMUSCULAR | Status: AC | PRN
  Administered 2013-09-30: 1 mg via INTRAVENOUS

## 2013-09-30 NOTE — Progress Notes (Signed)
Report called to pt's nurse, Fatmati

## 2013-09-30 NOTE — Progress Notes (Signed)
Pt received from radiology to go back to [facility].  GT attempt was unsuccessful.  Attempted to call report to [facility]

## 2013-09-30 NOTE — H&P (Signed)
[name] [name] is an 43 y.o. female.   Chief Complaint: Pt has been living in a Rehab facility x few months Long Hx Multiple Sclerosis and progressing Hx TIA/CVA Lives with son---but had developed sacral ulcer. Was not healing well---sent to SNF for care Failed swallow study at SNF Dysphagia; malnutrition; wt loss Slow healing Request for percutaneous gastric tube placement  HPI: MS; HTN; TIA; mild dementia; HLD; DM  Past Medical History  Diagnosis Date  . Multiple sclerosis   . HYPERTENSION 09/30/2006  . TIA (transient ischemic attack)   . TOBACCO USER 09/30/2006    Qualifier: Diagnosis of  By: Amil Amen MD, Benjamine Mola    . Multiple sclerosis, relapsing-remitting 05/29/2012  . DYSLIPIDEMIA 09/30/2006    Qualifier: Diagnosis of  By: Amil Amen MD, Benjamine Mola    . BENIGN POSITIONAL VERTIGO 01/27/2010    Qualifier: Diagnosis of  By: Amil Amen MD, Benjamine Mola    . Depression   . DIABETES MELLITUS, TYPE II 09/30/2006    Qualifier: Diagnosis of  By: Amil Amen MD, Benjamine Mola    . Neuromuscular disorder     MS  . Stroke     pt denies this hx on 09/02/2013    Past Surgical History  Procedure Laterality Date  . No past surgeries      Family History  Problem Relation Age of Onset  . Hypertension Mother   . Kidney failure Mother   . Hypertension Father   . Gout Father   . Hypertension Sister   . Diabetes Sister   . Hypertension Brother    Social History:  reports that she quit smoking about 2 years ago. Her smoking use included Cigarettes. She has a 10 pack-year smoking history. She has never used smokeless tobacco. She reports that she does not drink alcohol or use illicit drugs.  Allergies: No Known Allergies   (Not in a hospital admission)  Results for orders placed during the hospital encounter of 09/30/13 (from the past 48 hour(s))  APTT     Status: None   Collection Time    09/30/13 11:48 AM      Result Value Ref Range   aPTT 29  24 - 37 seconds  BASIC METABOLIC PANEL     Status:  Abnormal   Collection Time    09/30/13 11:48 AM      Result Value Ref Range   Sodium 143  137 - 147 mEq/L   Potassium 4.1  3.7 - 5.3 mEq/L   Comment: HEMOLYSIS AT THIS LEVEL MAY AFFECT RESULT   Chloride 105  96 - 112 mEq/L   CO2 22  19 - 32 mEq/L   Glucose, Bld 100 (*) 70 - 99 mg/dL   BUN 8  6 - 23 mg/dL   Creatinine, Ser 0.31 (*) 0.50 - 1.10 mg/dL   Calcium 9.2  8.4 - 10.5 mg/dL   GFR calc non Af Amer >90  >90 mL/min   GFR calc Af Amer >90  >90 mL/min   Comment: (NOTE)     The eGFR has been calculated using the CKD EPI equation.     This calculation has not been validated in all clinical situations.     eGFR's persistently <90 mL/min signify possible Chronic Kidney     Disease.   Anion gap 16 (*) 5 - 15  CBC     Status: Abnormal   Collection Time    09/30/13 11:48 AM      Result Value Ref Range   WBC 8.8  4.0 -  10.5 K/uL   RBC 4.13  3.87 - 5.11 MIL/uL   Hemoglobin 12.8  12.0 - 15.0 g/dL   HCT 38.1  36.0 - 46.0 %   MCV 92.3  78.0 - 100.0 fL   MCH 31.0  26.0 - 34.0 pg   MCHC 33.6  30.0 - 36.0 g/dL   RDW 14.1  11.5 - 15.5 %   Platelets 473 (*) 150 - 400 K/uL  PROTIME-INR     Status: None   Collection Time    09/30/13 11:48 AM      Result Value Ref Range   Prothrombin Time 14.7  11.6 - 15.2 seconds   INR 1.15  0.00 - 1.49   No results found.  Review of Systems  Constitutional: Positive for weight loss and malaise/fatigue. Negative for fever.  Respiratory: Negative for shortness of breath.   Cardiovascular: Negative for chest pain.  Gastrointestinal: Negative for nausea, vomiting and abdominal pain.  Musculoskeletal: Positive for back pain.  Neurological: Positive for weakness.  Psychiatric/Behavioral: Negative for substance abuse.    Blood pressure 152/98, pulse 121, temperature 98.9 F (37.2 C), temperature source Oral, resp. rate 18, weight 51.256 kg (113 lb), last menstrual period 08/23/2012, SpO2 100.00%. Physical Exam  Cardiovascular: Normal rate and regular  rhythm.   No murmur heard. tachy  Respiratory: Effort normal and breath sounds normal. She has no wheezes.  GI: Soft. Bowel sounds are normal. There is no tenderness.  Musculoskeletal: Normal range of motion.  Moves all 4s  Neurological: She is alert.  Pt hx MS Hx CVA/TIA Speech slow confusion  Skin: Skin is warm.  Psychiatric:  Son with pt--consented     Assessment/Plan MS Hx CVA/TIA Slow healing sacral ulcer Admitted to SNF for care Failed swallow study Wt loss; malnutrition Dysphagia Scheduled now for perc G tube placement pts son aware of procedure benefits and risks and agreeable to proceed Consent signed andin chart  Aydn Ferrara A 09/30/2013, 1:35 PM

## 2013-09-30 NOTE — Procedures (Signed)
A nasogastric tube was placed with fluoroscopy.  Barium was injected through the NGT.  NGT removed after barium injection.  Plan to bring back to [facility] on 9/11 for G-tube placement.

## 2013-10-01 ENCOUNTER — Other Ambulatory Visit: Payer: Self-pay | Admitting: Radiology

## 2013-10-02 ENCOUNTER — Ambulatory Visit (HOSPITAL_COMMUNITY)
Admission: RE | Admit: 2013-10-02 | Discharge: 2013-10-02 | Disposition: A | Discharge status: Home / Self Care | Payer: Medicare Other | Source: Ambulatory Visit | Attending: Internal Medicine | Admitting: Internal Medicine

## 2013-10-02 ENCOUNTER — Other Ambulatory Visit (HOSPITAL_COMMUNITY): Payer: Self-pay | Admitting: Internal Medicine

## 2013-10-02 ENCOUNTER — Encounter (HOSPITAL_COMMUNITY): Payer: Self-pay

## 2013-10-02 DIAGNOSIS — E46 Unspecified protein-calorie malnutrition: Secondary | ICD-10-CM | POA: Diagnosis not present

## 2013-10-02 DIAGNOSIS — G35 Multiple sclerosis: Secondary | ICD-10-CM | POA: Insufficient documentation

## 2013-10-02 DIAGNOSIS — Z8673 Personal history of transient ischemic attack (TIA), and cerebral infarction without residual deficits: Secondary | ICD-10-CM | POA: Diagnosis not present

## 2013-10-02 DIAGNOSIS — L89153 Pressure ulcer of sacral region, stage 3: Secondary | ICD-10-CM

## 2013-10-02 DIAGNOSIS — Z431 Encounter for attention to gastrostomy: Secondary | ICD-10-CM | POA: Diagnosis present

## 2013-10-02 DIAGNOSIS — R1310 Dysphagia, unspecified: Secondary | ICD-10-CM | POA: Insufficient documentation

## 2013-10-02 MED ORDER — CEFAZOLIN SODIUM-DEXTROSE 2-3 GM-% IV SOLR
INTRAVENOUS | Status: AC
  Administered 2013-10-02: 2000 mg
  Filled 2013-10-02: qty 50

## 2013-10-02 MED ORDER — LIDOCAINE HCL 1 % IJ SOLN
INTRAMUSCULAR | Status: AC
  Filled 2013-10-02: qty 20

## 2013-10-02 MED ORDER — FENTANYL CITRATE 0.05 MG/ML IJ SOLN
INTRAMUSCULAR | Status: AC | PRN
  Administered 2013-10-02: 50 ug via INTRAVENOUS

## 2013-10-02 MED ORDER — FENTANYL CITRATE 0.05 MG/ML IJ SOLN
INTRAMUSCULAR | Status: AC
  Filled 2013-10-02: qty 2

## 2013-10-02 MED ORDER — GLUCAGON HCL RDNA (DIAGNOSTIC) 1 MG IJ SOLR
INTRAMUSCULAR | Status: AC | PRN
  Administered 2013-10-02: 1 mg via INTRAVENOUS

## 2013-10-02 MED ORDER — MIDAZOLAM HCL 2 MG/2ML IJ SOLN
INTRAMUSCULAR | Status: AC
  Filled 2013-10-02: qty 2

## 2013-10-02 MED ORDER — MIDAZOLAM HCL 2 MG/2ML IJ SOLN
INTRAMUSCULAR | Status: AC | PRN
  Administered 2013-10-02: 1 mg via INTRAVENOUS

## 2013-10-02 MED ORDER — GLUCAGON HCL RDNA (DIAGNOSTIC) 1 MG IJ SOLR
INTRAMUSCULAR | Status: AC
  Filled 2013-10-02: qty 1

## 2013-10-02 MED ORDER — IOHEXOL 300 MG/ML  SOLN
50.0000 mL | Freq: Once | INTRAMUSCULAR | Status: AC | PRN
  Administered 2013-10-02: 20 mL via INTRAVENOUS

## 2013-10-02 NOTE — Progress Notes (Signed)
Patient was rescheduled today from 09/30/13 for a image guided percutaneous gastrostomy tube placement for long history of multiple sclerosis, TIA/CVA, dysphagia and malnutrition and need for nutrition. She denies any fever or chills, chest pain or shortness of breath. She denies any known complications to sedation and has received barium via NGT on 09/30/13 in preparation for today's procedure.   PE: General: A&Ox3, NAD Heart: RRR without M/G/R Lungs: CTA bilaterally without w/r/r Abd: Soft, NT, ND, (+) BS  A/P: Dysphagia, Malnutrition, history of CVA, Multiple sclerosis. Plan for percutaneous G-tube placement today with moderate sedation. Informed consent obtained on 09/30/13.  Pattricia Boss PA-C Interventional Radiology  10/02/13  8:19 AM

## 2013-10-02 NOTE — Progress Notes (Signed)
Called [facility] to pick up pt in rad nsg station. ETA for pickup 30-41min per [facility] via phone at [facility]

## 2013-10-02 NOTE — Sedation Documentation (Signed)
Pt picked up by [facility] transportation for return to [facility].

## 2013-10-02 NOTE — Progress Notes (Signed)
Pt arrived to Radiology nurses station from IR room 1.  Resting peacefully, awakens with ease.

## 2013-10-02 NOTE — Procedures (Signed)
Successful 20 fr GTUBE INSERTION No comp Stable Full report in pacs

## 2013-10-07 ENCOUNTER — Encounter (HOSPITAL_BASED_OUTPATIENT_CLINIC_OR_DEPARTMENT_OTHER): Payer: Medicare Other | Attending: General Surgery

## 2013-10-07 DIAGNOSIS — L89109 Pressure ulcer of unspecified part of back, unspecified stage: Secondary | ICD-10-CM | POA: Insufficient documentation

## 2013-10-07 DIAGNOSIS — L8993 Pressure ulcer of unspecified site, stage 3: Secondary | ICD-10-CM | POA: Insufficient documentation

## 2013-10-12 DIAGNOSIS — L89109 Pressure ulcer of unspecified part of back, unspecified stage: Secondary | ICD-10-CM | POA: Diagnosis present

## 2013-10-12 DIAGNOSIS — L8993 Pressure ulcer of unspecified site, stage 3: Secondary | ICD-10-CM | POA: Diagnosis not present

## 2013-10-12 NOTE — Progress Notes (Signed)
[facility]  NAME:  [name] [name], [name] [name]             ACCOUNT NO.:  [id number]  MEDICAL RECORD NO.:  [id number]      DATE OF BIRTH:  1970/04/04  PHYSICIAN:  Wayland Denis, DO       VISIT DATE:  10/12/2013                                  OFFICE VISIT   The patient is a 43 year old female who is here for evaluation of a sacral ulcer.  She has multiple medical conditions, is currently in a facility and being treated.  She has a large sacral ulcer and this is being treated with wet-to-dry dressings.  Her son is accompanying her on this visit.  Her past medical history is positive for encephalopathy, stage III decubitus ulcer, advanced multiple sclerosis, dementia, hypertension, diabetes, possible TIA, hypokalemia.  SURGICAL HISTORY:  She has a feeding tube.  She did have I and D of sacral ulcer in August and she has had gynecological surgery, but the son is not sure of what was done.  ALLERGIES:  None.  MEDICATIONS:  Amlodipine, aspirin, baclofen, citalopram, meclizine, metformin, metoprolol, Florastor, Duragesic patch, hydrocodone, potassium, Santyl, vitamin C, and zinc.  She does not smoke.  She lives in a facility and she has some family support.  On exam, she is alert.  Full history is given from her son.  Her breathing is nonlabored.  Her heart rate is regular.  Her abdomen is soft.  The wound is large with the dimensions and description noted in the chart.  She had some nonviable tissue, this was debrided.  Overall, it does look clean.  She is going to need a diverting ostomy so that she does not continue to stool in this area.  A call was placed to Dr. Hyman Hopes to discuss this and I left a message.  In the meantime, I recommend Santyl wet-to-dry, offload air mattress bed, check a prealbumin and a General Surgery consult and she can follow back in the clinic here in 30 days.     Wayland Denis, DO     CS/MEDQ  D:  10/12/2013  T:  10/12/2013  Job:   [id number]

## 2013-11-05 ENCOUNTER — Other Ambulatory Visit (INDEPENDENT_AMBULATORY_CARE_PROVIDER_SITE_OTHER): Payer: Self-pay | Admitting: Surgery

## 2013-11-11 ENCOUNTER — Encounter (HOSPITAL_COMMUNITY): Payer: Self-pay | Admitting: Emergency Medicine

## 2013-11-11 ENCOUNTER — Encounter (HOSPITAL_BASED_OUTPATIENT_CLINIC_OR_DEPARTMENT_OTHER): Payer: Medicare Other | Attending: General Surgery

## 2013-11-11 ENCOUNTER — Emergency Department (HOSPITAL_COMMUNITY)
Admission: EM | Admit: 2013-11-11 | Discharge: 2013-11-11 | Disposition: A | Discharge status: Home / Self Care | Payer: Medicare Other | Attending: Emergency Medicine | Admitting: Emergency Medicine

## 2013-11-11 DIAGNOSIS — E119 Type 2 diabetes mellitus without complications: Secondary | ICD-10-CM | POA: Diagnosis not present

## 2013-11-11 DIAGNOSIS — F329 Major depressive disorder, single episode, unspecified: Secondary | ICD-10-CM | POA: Insufficient documentation

## 2013-11-11 DIAGNOSIS — I10 Essential (primary) hypertension: Secondary | ICD-10-CM | POA: Diagnosis not present

## 2013-11-11 DIAGNOSIS — Z7982 Long term (current) use of aspirin: Secondary | ICD-10-CM | POA: Diagnosis not present

## 2013-11-11 DIAGNOSIS — Z8673 Personal history of transient ischemic attack (TIA), and cerebral infarction without residual deficits: Secondary | ICD-10-CM | POA: Diagnosis not present

## 2013-11-11 DIAGNOSIS — Z87891 Personal history of nicotine dependence: Secondary | ICD-10-CM | POA: Diagnosis not present

## 2013-11-11 DIAGNOSIS — L89154 Pressure ulcer of sacral region, stage 4: Secondary | ICD-10-CM | POA: Insufficient documentation

## 2013-11-11 DIAGNOSIS — Z79899 Other long term (current) drug therapy: Secondary | ICD-10-CM | POA: Insufficient documentation

## 2013-11-11 LAB — URINALYSIS, ROUTINE W REFLEX MICROSCOPIC
Bilirubin Urine: NEGATIVE
Glucose, UA: NEGATIVE mg/dL
Hgb urine dipstick: NEGATIVE
Ketones, ur: NEGATIVE mg/dL
LEUKOCYTES UA: NEGATIVE
NITRITE: NEGATIVE
PH: 6.5 (ref 5.0–8.0)
Protein, ur: NEGATIVE mg/dL
SPECIFIC GRAVITY, URINE: 1.021 (ref 1.005–1.030)
Urobilinogen, UA: 1 mg/dL (ref 0.0–1.0)

## 2013-11-11 LAB — CBC WITH DIFFERENTIAL/PLATELET
BASOS ABS: 0 10*3/uL (ref 0.0–0.1)
Basophils Relative: 0 % (ref 0–1)
Eosinophils Absolute: 0.1 10*3/uL (ref 0.0–0.7)
Eosinophils Relative: 1 % (ref 0–5)
HCT: 32.5 % — ABNORMAL LOW (ref 36.0–46.0)
HEMOGLOBIN: 10.5 g/dL — AB (ref 12.0–15.0)
LYMPHS PCT: 10 % — AB (ref 12–46)
Lymphs Abs: 1.5 10*3/uL (ref 0.7–4.0)
MCH: 28.1 pg (ref 26.0–34.0)
MCHC: 32.3 g/dL (ref 30.0–36.0)
MCV: 86.9 fL (ref 78.0–100.0)
MONOS PCT: 7 % (ref 3–12)
Monocytes Absolute: 1 10*3/uL (ref 0.1–1.0)
NEUTROS ABS: 12.5 10*3/uL — AB (ref 1.7–7.7)
Neutrophils Relative %: 82 % — ABNORMAL HIGH (ref 43–77)
Platelets: 542 10*3/uL — ABNORMAL HIGH (ref 150–400)
RBC: 3.74 MIL/uL — ABNORMAL LOW (ref 3.87–5.11)
RDW: 14.5 % (ref 11.5–15.5)
WBC: 15.2 10*3/uL — AB (ref 4.0–10.5)

## 2013-11-11 LAB — BASIC METABOLIC PANEL
ANION GAP: 14 (ref 5–15)
BUN: 12 mg/dL (ref 6–23)
CHLORIDE: 97 meq/L (ref 96–112)
CO2: 25 meq/L (ref 19–32)
Calcium: 9.4 mg/dL (ref 8.4–10.5)
Creatinine, Ser: 0.23 mg/dL — ABNORMAL LOW (ref 0.50–1.10)
GFR calc Af Amer: >90 mL/min (ref >90)
GFR calc non Af Amer: >90 mL/min (ref >90)
Glucose, Bld: 149 mg/dL — ABNORMAL HIGH (ref 70–99)
Potassium: 4.1 mEq/L (ref 3.7–5.3)
Sodium: 136 mEq/L — ABNORMAL LOW (ref 137–147)

## 2013-11-11 MED ORDER — SODIUM CHLORIDE 0.9 % IV BOLUS (SEPSIS)
500.0000 mL | Freq: Once | INTRAVENOUS | Status: AC
  Administered 2013-11-11: 500 mL via INTRAVENOUS

## 2013-11-11 NOTE — ED Notes (Signed)
Per EMS: Pt from [facility], pt was taken to wound care center for tx of sacral pressure ulcer. Pt sent over here for pain management.

## 2013-11-11 NOTE — ED Provider Notes (Signed)
CSN: [id number]     Arrival date & time 11/11/13  1038 History   First MD Initiated Contact with Patient 11/11/13 1055     Chief Complaint  Patient presents with  . pressure ulcer pain      (Consider location/radiation/quality/duration/timing/severity/associated sxs/prior Treatment) HPI Comments: The patient arrives via EMS from the [facility] where she received care for her chronic sacral decubitus ulcer. She was sent here, per EMS, for pain control. The patient denies pain at this time. No reported fever, vomiting or altered mental status. She has a medical history complicated by advanced MS, dementia and encephalopathy. She is awake, responsive and does not appear in any distress.   The history is provided by the patient and the EMS personnel. No language interpreter was used.    Past Medical History  Diagnosis Date  . Multiple sclerosis   . HYPERTENSION 09/30/2006  . TIA (transient ischemic attack)   . TOBACCO USER 09/30/2006    Qualifier: Diagnosis of  By: Delrae Alfred MD, Lanora Manis    . Multiple sclerosis, relapsing-remitting 05/29/2012  . DYSLIPIDEMIA 09/30/2006    Qualifier: Diagnosis of  By: Delrae Alfred MD, Lanora Manis    . BENIGN POSITIONAL VERTIGO 01/27/2010    Qualifier: Diagnosis of  By: Delrae Alfred MD, Lanora Manis    . Depression   . DIABETES MELLITUS, TYPE II 09/30/2006    Qualifier: Diagnosis of  By: Delrae Alfred MD, Lanora Manis    . Neuromuscular disorder     MS  . Stroke     pt denies this hx on 09/02/2013   Past Surgical History  Procedure Laterality Date  . No past surgeries     Family History  Problem Relation Age of Onset  . Hypertension Mother   . Kidney failure Mother   . Hypertension Father   . Gout Father   . Hypertension Sister   . Diabetes Sister   . Hypertension Brother    History  Substance Use Topics  . Smoking status: Former Smoker -- 0.50 packs/day for 20 years    Types: Cigarettes    Quit date: 07/07/2011  . Smokeless tobacco: Never Used  . Alcohol Use:  No   OB History   Grav Para Term Preterm Abortions TAB SAB Ect Mult Living                 Review of Systems  Constitutional: Negative for fever.  HENT: Negative for congestion and trouble swallowing.   Gastrointestinal: Negative for nausea and vomiting.  Musculoskeletal: Negative for myalgias.  Skin: Positive for wound.      Allergies  Review of patient's allergies indicates no known allergies.  Home Medications   Prior to Admission medications   Medication Sig Start Date End Date Taking? Authorizing Provider  Amino Acids-Protein Hydrolys (FEEDING SUPPLEMENT, PRO-STAT SUGAR FREE 64,) LIQD Place 30 mLs into feeding tube daily.   Yes Historical Provider, MD  amLODipine (NORVASC) 5 MG tablet Take 1 tablet (5 mg total) by mouth daily. 07/24/13  Yes Daniel J Angiulli, PA-C  ascorbic acid (VITAMIN C) 500 MG tablet Take 500 mg by mouth 2 (two) times daily.   Yes Historical Provider, MD  aspirin EC 81 MG tablet Take 1 tablet (81 mg total) by mouth daily. 07/24/13  Yes Daniel J Angiulli, PA-C  baclofen (LIORESAL) 10 MG tablet Take 5 mg by mouth 3 (three) times daily.   Yes Historical Provider, MD  citalopram (CELEXA) 10 MG tablet Take 1 tablet (10 mg total) by mouth daily. 08/13/13  Yes Cira Servant, DO  ENSURE (ENSURE) Take 237 mLs by mouth 2 (two) times daily between meals.   Yes Historical Provider, MD  fentaNYL (DURAGESIC - DOSED MCG/HR) 25 MCG/HR patch Place 25 mcg onto the skin every 3 (three) days.   Yes Historical Provider, MD  HYDROcodone-acetaminophen (NORCO/VICODIN) 5-325 MG per tablet Take 1 tablet by mouth every 6 (six) hours as needed for moderate pain or severe pain.    Yes Historical Provider, MD  meclizine (ANTIVERT) 25 MG tablet Take 25 mg by mouth every 8 (eight) hours as needed for dizziness or nausea.   Yes Historical Provider, MD  metFORMIN (GLUCOPHAGE) 500 MG tablet Take 1 tablet (500 mg total) by mouth daily with breakfast. 07/24/13  Yes Mcarthur Rossetti Angiulli, PA-C   metoprolol tartrate (LOPRESSOR) 25 MG tablet Take 1 tablet (25 mg total) by mouth 2 (two) times daily. 07/24/13  Yes Daniel J Angiulli, PA-C  Nutritional Supplements (FEEDING SUPPLEMENT, OSMOLITE 1.2 CAL,) LIQD Place 1,000 mLs into feeding tube continuous.   Yes Historical Provider, MD  potassium chloride (MICRO-K) 10 MEQ CR capsule Take 10 mEq by mouth daily.   Yes Historical Provider, MD  saccharomyces boulardii (FLORASTOR) 250 MG capsule Take 1 capsule (250 mg total) by mouth 2 (two) times daily. 09/04/13  Yes Zannie Cove, MD  zinc sulfate 220 MG capsule Take 220 mg by mouth daily.   Yes Historical Provider, MD   BP 154/99  Pulse 108  Temp(Src) 99.7 F (37.6 C) (Rectal)  Resp 17  SpO2 100%  LMP 08/23/2012 Physical Exam  Constitutional: She appears well-developed and well-nourished. No distress.  HENT:  Head: Normocephalic and atraumatic.  Mouth/Throat: Oropharynx is clear and moist.  Eyes: Conjunctivae are normal.  Neck: Normal range of motion. Neck supple.  Cardiovascular: Regular rhythm.  Tachycardia present.   Pulmonary/Chest: Effort normal. She has no wheezes. She has no rales.  Abdominal: Soft. There is no tenderness.  Neurological: She is alert. Coordination normal.  Speech is delayed. She is awake, responsive, answers questions, cooperates with exam.  Skin:  Deep stage 4 sacral decubitus ulceration that is clean, with new, clean packing and bandaged. No evidence of acute infection, malodor or purulent drainage.   Psychiatric: She has a normal mood and affect.    ED Course  Procedures (including critical care time) Labs Review Labs Reviewed  CBC WITH DIFFERENTIAL - Abnormal; Notable for the following:    WBC 15.2 (*)    RBC 3.74 (*)    Hemoglobin 10.5 (*)    HCT 32.5 (*)    Platelets 542 (*)    Neutrophils Relative % 82 (*)    Neutro Abs 12.5 (*)    Lymphocytes Relative 10 (*)    All other components within normal limits  BASIC METABOLIC PANEL - Abnormal;  Notable for the following:    Sodium 136 (*)    Glucose, Bld 149 (*)    Creatinine, Ser 0.23 (*)    All other components within normal limits  URINE CULTURE  URINALYSIS, ROUTINE W REFLEX MICROSCOPIC    Imaging Review No results found.   EKG Interpretation None      MDM   Final diagnoses:  None    1. Decubitus ulcer, chronic 2. Multiple sclerosis 3. Dementia  The patient has remained awake and alert throughout ED visit without change. She continues to endorse no pain and is well appearing in the context of her chronic condition of advanced MS, dementia, h/o encephalopathy. Labs are reassuring.  Vitals improved. Feel she is stable  For discharge home. She has regular prn pain medications for comfort.    Arnoldo HookerShari A Jguadalupe Opiela, PA-C 11/11/13 [id number]

## 2013-11-11 NOTE — Discharge Instructions (Signed)
FOLLOW UP WITH YOUR DOCTOR AS NEEDED FOR ROUTINE CARE. RETURN HERE AS NEEDED FOR UNCONTROLLED PAIN, FEVER, NEW CONCERN.

## 2013-11-11 NOTE — ED Notes (Signed)
Bed: [facility] Expected date:  Expected time:  Means of arrival:  Comments: 

## 2013-11-11 NOTE — ED Notes (Signed)
Called [facility] for transportation  

## 2013-11-12 LAB — URINE CULTURE
COLONY COUNT: NO GROWTH
CULTURE: NO GROWTH

## 2013-11-12 NOTE — ED Provider Notes (Signed)
Medical screening examination/treatment/procedure(s) were performed by non-physician practitioner and as supervising physician I was immediately available for consultation/collaboration.   EKG Interpretation None       Raeford Razor, MD 11/12/13 [phone]

## 2013-11-13 ENCOUNTER — Ambulatory Visit: Payer: Self-pay | Admitting: Neurology

## 2013-11-18 NOTE — Pre-Procedure Instructions (Signed)
[name] [name]  11/18/2013   Your procedure is scheduled on:  Thursday November 26, 2013 at 10:30 AM.  Report to [facility] Admitting at 8:30 AM.  Call this number if you have problems the morning of surgery: [phone]  Call this number if you have any questions prior to surgery: [phone]    Remember:   Do not eat food or drink liquids after midnight. Please STOP tube feeding at midnight.   Take these medicines the morning of surgery with A SIP OF WATER: Amlodipine (Norvasc), Baclofen (Lioresal), Citalopram (Celexa), Hydrocodone if needed, Meclizine (Antivert) if needed, and Metoprolol (Lopressor)   Discontinue aspirin and herbal medications 5 days before surgery   Do not wear jewelry, make-up or nail polish.  Do not wear lotions, powders, or perfumes.   Do not shave 48 hours prior to surgery.   Do not bring valuables to the hospital.  [facility] is not responsible for any belongings or valuables.               Contacts, dentures or bridgework may not be worn into surgery.  Leave suitcase in the car. After surgery it may be brought to your room.  For patients admitted to the hospital, discharge time is determined by your treatment team.               Patients discharged the day of surgery will not be allowed to drive home.  Name and phone number of your driver: Family/Friend  Special Instructions: Shower using CHG soap the night before and the morning of your surgery   Please read over the following fact sheets that you were given: Pain Booklet, Coughing and Deep Breathing and Surgical Site Infection Prevention

## 2013-11-19 ENCOUNTER — Encounter (HOSPITAL_COMMUNITY)
Admission: RE | Admit: 2013-11-19 | Discharge: 2013-11-19 | Disposition: A | Discharge status: Home / Self Care | Payer: Medicare Other | Source: Ambulatory Visit | Attending: Orthopaedic Surgery | Admitting: Orthopaedic Surgery

## 2013-11-19 ENCOUNTER — Encounter (HOSPITAL_COMMUNITY): Payer: Self-pay

## 2013-11-19 DIAGNOSIS — Z931 Gastrostomy status: Secondary | ICD-10-CM | POA: Insufficient documentation

## 2013-11-19 DIAGNOSIS — Z01818 Encounter for other preprocedural examination: Secondary | ICD-10-CM | POA: Insufficient documentation

## 2013-11-19 DIAGNOSIS — E785 Hyperlipidemia, unspecified: Secondary | ICD-10-CM | POA: Insufficient documentation

## 2013-11-19 DIAGNOSIS — H8110 Benign paroxysmal vertigo, unspecified ear: Secondary | ICD-10-CM | POA: Insufficient documentation

## 2013-11-19 DIAGNOSIS — Z8673 Personal history of transient ischemic attack (TIA), and cerebral infarction without residual deficits: Secondary | ICD-10-CM | POA: Diagnosis not present

## 2013-11-19 DIAGNOSIS — L89159 Pressure ulcer of sacral region, unspecified stage: Secondary | ICD-10-CM | POA: Insufficient documentation

## 2013-11-19 DIAGNOSIS — Z87891 Personal history of nicotine dependence: Secondary | ICD-10-CM | POA: Insufficient documentation

## 2013-11-19 DIAGNOSIS — E118 Type 2 diabetes mellitus with unspecified complications: Secondary | ICD-10-CM | POA: Insufficient documentation

## 2013-11-19 DIAGNOSIS — G35 Multiple sclerosis: Secondary | ICD-10-CM | POA: Diagnosis not present

## 2013-11-19 DIAGNOSIS — I071 Rheumatic tricuspid insufficiency: Secondary | ICD-10-CM | POA: Insufficient documentation

## 2013-11-19 DIAGNOSIS — I10 Essential (primary) hypertension: Secondary | ICD-10-CM | POA: Insufficient documentation

## 2013-11-19 DIAGNOSIS — I313 Pericardial effusion (noninflammatory): Secondary | ICD-10-CM | POA: Diagnosis not present

## 2013-11-19 DIAGNOSIS — F329 Major depressive disorder, single episode, unspecified: Secondary | ICD-10-CM | POA: Insufficient documentation

## 2013-11-19 HISTORY — DX: Unspecified open wound of lower back and pelvis without penetration into retroperitoneum, initial encounter: S31.000A

## 2013-11-19 LAB — CBC
HEMATOCRIT: 33.1 % — AB (ref 36.0–46.0)
Hemoglobin: 10.5 g/dL — ABNORMAL LOW (ref 12.0–15.0)
MCH: 27.2 pg (ref 26.0–34.0)
MCHC: 31.7 g/dL (ref 30.0–36.0)
MCV: 85.8 fL (ref 78.0–100.0)
Platelets: 558 10*3/uL — ABNORMAL HIGH (ref 150–400)
RBC: 3.86 MIL/uL — ABNORMAL LOW (ref 3.87–5.11)
RDW: 14.6 % (ref 11.5–15.5)
WBC: 11 10*3/uL — AB (ref 4.0–10.5)

## 2013-11-19 LAB — BASIC METABOLIC PANEL
Anion gap: 12 (ref 5–15)
BUN: 11 mg/dL (ref 6–23)
CALCIUM: 9.5 mg/dL (ref 8.4–10.5)
CHLORIDE: 101 meq/L (ref 96–112)
CO2: 26 meq/L (ref 19–32)
CREATININE: 0.25 mg/dL — AB (ref 0.50–1.10)
GFR calc Af Amer: >90 mL/min (ref >90)
GFR calc non Af Amer: >90 mL/min (ref >90)
Glucose, Bld: 137 mg/dL — ABNORMAL HIGH (ref 70–99)
Potassium: 3.6 mEq/L — ABNORMAL LOW (ref 3.7–5.3)
Sodium: 139 mEq/L (ref 137–147)

## 2013-11-19 NOTE — Progress Notes (Signed)
Patient arrived to pre-admit via ambulance per stretcher. Patient was placed in room 7 in short stay. Patient appeared tearful. Son and sons fiance' was at chair side during PAT visit. DOS instructions reviewed with patients son and a copy was given to him, and a copy will be given to transport to take [place] to [place]. Patient is alert and oriented to person and place, but not situation. Patient denied having any pain at this time.

## 2013-11-19 NOTE — Progress Notes (Signed)
Anesthesia PAT Evaluation:  Patient is a 43 year old female scheduled for laparoscopic assisted colostomy by Dr. Abigail Miyamoto on 11/26/13.  She has aggressive MS with sacral decubitus and to my understanding need a diverting colostomy to aid in healing.    Other history includes DM2, former smoker, HTN, TIA, benign positional vertigo, depression, dyslipidemia, sepsis admission 08/2013, PEG 09/30/13. PCP is Dr. Hyman Hopes.  Neurologist is Dr. Everlena Cooper, last visit 08/13/13.  His note mentions trying Tysarbri for MS, but son says this was not approved due to a underlying infection.  She is not on any medications for her MS.    Meds: Pro-STAT sugar free TF, Ensure, Norco, Norvasc, Vitamin C, ASA 81 mg, baclofen, Celexa, fentanyl, meclizine, metformin, Lopressor, KCL, Florastor, zinc sulfate.  Vitals: 36.6 C, HR 113 bpm, RR 18, 131/81, 100%.  She currently resides at [facility]. She does not ambulate and needs assistant with meals.  She has a PEG tube.  Her son says it was placed because she was refusing to eat.  Patient would not verbalize much, primary just yes or no.  She has BUE/BLE weakness, but has not had any respiratory issues.  She denied chest pain/SOB or known CAD/CHF. Exam shows a black female in NAD.  Flat affect.  Heart regular but tachycardic in the ~ 110 bpm.  Lungs clear anteriorly.  Mouth opening was ~ 3 FB, large tongue.    EKG on 07/03/13 showed: SR @ 96 bpm, non-specific repolarization abnormality in diffuse leads--most notable in inferolateral leads. She has had a degree of inferolateral T wave abnormality on EKGs dating back to 05/2012.    Echo on 05/29/12 showed:  - Left ventricle: The cavity size was normal. Systolic function was normal. The estimated ejection fraction was in the range of 60% to 65%. Wall motion was normal; there were no regional wall motion abnormalities. - Tricuspid valve: Mild regurgitation. - Pericardium, extracardiac: A trivial pericardial effusion was  identified.  CXR on 09/01/13: No active cardiopulmonary disease.  Preoperative BMET/CBC noted.  Glucose 137.  H/H 10.5/33.1, stable since 11/11/13 (previously H/H ~13/40 in 08/2013).    Reviewed above with anesthesiologist Dr. Aleene Davidson.  Patient with aggressive MS.  Denied pulmonary involvement, but does have a PEG tube so she is likely more prone to aspiration pneumonia.  No cardiopulmonary issues/symptoms currently.  Plan to proceed in no acute changes. Needs a T&S.  Velna Ochs [facility] Short Stay Center/Anesthesiology Phone [phone] 11/19/2013 2:43 PM

## 2013-11-19 NOTE — Progress Notes (Signed)
[facility] and spoke with Tammy. Nurse informed Tammy that Melody the wound care Nurse had drawn a circle and placed a Tegaderm dressing on patients skin for colostomy procedure and that the dressing was not to be removed. Tammy verbalized understanding.

## 2013-11-19 NOTE — Consult Note (Signed)
WOC requested for preoperative stoma site marking  Discussed surgical procedure and stoma creation with patient and family. Pt. Understands conversation but is limited in her responses, she did speak in one to two word responses and shook her head sometimes yes or no.  Her son and son's fiance are at the bedside. Pt resides in a SNF at this time for wound care and per the son has plans to return to home at which time he will be her primary CG.     Explained role of the WOC nurse team.  Provided the patient with educational booklet/DVD and provided samples of pouching options.  Answered family questions.   Examined patient lying only as this patient does not ambulate or stand. Currently per her son she does not even transfer into the wheel chair in order to keep all pressure off her sacral wound.  I attempted to keep the marking away from any creases or abdominal contour issues and within the rectus muscle.  Attempted to mark below the patient's belt line.    Marked for colostomy in the LLQ  3 cm to the left of the umbilicus and _2 cm above/below the umbilicus.   Covered mark with thin film transparent dressing to preserve mark until date of surgery  WOC team will follow up with patient after surgery for continue ostomy care and teaching.  Lurlean Kernen [place] RN,CWOCN [phone]

## 2013-11-25 MED ORDER — CEFAZOLIN SODIUM-DEXTROSE 2-3 GM-% IV SOLR
2.0000 g | INTRAVENOUS | Status: AC
  Administered 2013-11-26: 2 g via INTRAVENOUS

## 2013-11-25 NOTE — H&P (Signed)
[name] [name] 11/05/2013 11:15 AM Location: [facility] Patient #: [id number] DOB: [birth year] Single / Language: Undefined / Race: Undefined Female  History of Present Illness (Brandley Aldrete A. Magnus Ivan MD; 11/05/2013 11:22 AM) Patient words: conuslt for colostomy.  The patient is a 43 year old female who presents for a pre-op visit. This is an unfortunate patient who is known to our service with severe multiple sclerosis. She is currently in a rehabilitation facility and is quite deconditioned and wheelchair bound. She has a known sacral decubitus. She recently had a G-tube placed by IR. Her medical physicians are requesting colostomy placement.   Other Problems Gilmer Mor, CMA; 11/05/2013 11:17 AM) Cerebrovascular Accident Depression Diabetes Mellitus High blood pressure Other disease, cancer, significant illness  Diagnostic Studies History Gilmer Mor, CMA; 11/05/2013 11:17 AM) Colonoscopy never Pap Smear >5 years ago  Allergies Lamar Laundry Bynum, CMA; 11/05/2013 11:16 AM) No Known Drug Allergies10/15/2015  Medication History (Sonya Bynum, CMA; 11/05/2013 11:16 AM) AmLODIPine Besylate (10MG  Tablet, Oral daily) Active. Aspirin (81MG  Tablet DR, Oral daily) Active. BuPROPion HCl ER (XL) (150MG  Tablet ER 24HR, Oral daily) Active. Citalopram Hydrobromide (10MG  Tablet, Oral daily) Active. Metoprolol Tartrate (25MG  Tablet, Oral daily) Active. MetFORMIN HCl (500MG  Tablet, Oral daily) Active.  Social History Gilmer Mor, CMA; 11/05/2013 11:17 AM) Alcohol use Remotely quit alcohol use. Caffeine use Carbonated beverages. No drug use Tobacco use Former smoker.  Family History Gilmer Mor, CMA; 11/05/2013 11:17 AM) Cerebrovascular Accident Brother, Father, Mother. Diabetes Mellitus Brother, Father, Mother, Sister. Hypertension Brother, Father, Mother, Sister.  Pregnancy / Birth History Gilmer Mor, CMA; 11/05/2013 11:17 AM) Rhett Bannister 2 Irregular  periods Maternal age 53-20 Para 2  Review of Systems Lamar Laundry Bynum CMA; 11/05/2013 11:17 AM) General Not Present- Appetite Loss, Chills, Fatigue, Fever, Night Sweats, Weight Gain and Weight Loss. Skin Present- Non-Healing Wounds. Not Present- Change in Wart/Mole, Dryness, Hives, Jaundice, New Lesions, Rash and Ulcer. Respiratory Not Present- Bloody sputum, Chronic Cough, Difficulty Breathing, Snoring and Wheezing. Breast Not Present- Breast Mass, Breast Pain, Nipple Discharge and Skin Changes. Cardiovascular Not Present- Chest Pain, Difficulty Breathing Lying Down, Leg Cramps, Palpitations, Rapid Heart Rate, Shortness of Breath and Swelling of Extremities. Gastrointestinal Present- Difficulty Swallowing. Not Present- Abdominal Pain, Bloating, Bloody Stool, Change in Bowel Habits, Chronic diarrhea, Constipation, Excessive gas, Gets full quickly at meals, Hemorrhoids, Indigestion, Nausea, Rectal Pain and Vomiting. Female Genitourinary Not Present- Frequency, Nocturia, Painful Urination, Pelvic Pain and Urgency. Musculoskeletal Present- Muscle Pain and Muscle Weakness. Not Present- Back Pain, Joint Pain, Joint Stiffness and Swelling of Extremities. Neurological Present- Decreased Memory and Weakness. Not Present- Fainting, Headaches, Numbness, Seizures, Tingling, Tremor and Trouble walking. Psychiatric Present- Anxiety, Depression and Fearful. Not Present- Bipolar, Change in Sleep Pattern and Frequent crying.   Vitals (Sonya Bynum CMA; 11/05/2013 11:16 AM) 11/05/2013 11:16 AM Weight: 120 lb Height: 60in Body Surface Area: 1.52 m Body Mass Index: 23.44 kg/m Temp.: 97.25F(Temporal)  Pulse: 117 (Regular)  BP: 122/70 (Sitting, Left Arm, Standard)    Physical Exam (Jaquesha Boroff A. Magnus Ivan MD; 11/05/2013 11:22 AM) The physical exam findings are as follows: Note:Wheel chair bound abdomen is soft lungs are clear bilaterally cardiovascular is regular rate and rhythm    Assessment  & Plan (Marnie Fazzino A. Magnus Ivan MD; 11/05/2013 11:24 AM) MULTIPLE SCLEROSIS (340  G35) Impression: Because of her deconditioned state, inability to ambulate, and a sacral decubitus, colostomy placement has been requested and is encouraged. She is accompanied by her family who is in total agreement to this. I discussed the risks  with him in detail and they're eager to proceed. Surgery will be scheduled.  The risks of surgery include but are not limited to bleeding, infection, injury to surrounding structures, ostomy necrosis, need for further surgery, DVT, cardiopuloonary issues, etc.     Signed by Shelly Rubensteinouglas A Einer Meals, MD (11/05/2013 11:24 AM)

## 2013-11-25 NOTE — Progress Notes (Signed)
[facility] aware of time change and will have patient here at 7:30 AM

## 2013-11-26 ENCOUNTER — Inpatient Hospital Stay (HOSPITAL_COMMUNITY)
Admission: RE | Admit: 2013-11-26 | Discharge: 2013-11-30 | DRG: 983 | Disposition: A | Discharge status: Skilled Nursing Facility | Payer: Medicare Other | Source: Ambulatory Visit | Attending: Surgery | Admitting: Surgery

## 2013-11-26 ENCOUNTER — Inpatient Hospital Stay (HOSPITAL_COMMUNITY): Payer: Medicare Other | Admitting: Anesthesiology

## 2013-11-26 ENCOUNTER — Inpatient Hospital Stay (HOSPITAL_COMMUNITY): Payer: Medicare Other | Admitting: Vascular Surgery

## 2013-11-26 ENCOUNTER — Encounter (HOSPITAL_COMMUNITY): Payer: Self-pay | Admitting: Critical Care Medicine

## 2013-11-26 ENCOUNTER — Encounter (HOSPITAL_COMMUNITY)
Admission: RE | Disposition: A | Discharge status: Skilled Nursing Facility | Payer: Self-pay | Source: Ambulatory Visit | Attending: Surgery

## 2013-11-26 DIAGNOSIS — Z931 Gastrostomy status: Secondary | ICD-10-CM | POA: Diagnosis not present

## 2013-11-26 DIAGNOSIS — I10 Essential (primary) hypertension: Secondary | ICD-10-CM | POA: Diagnosis present

## 2013-11-26 DIAGNOSIS — G35 Multiple sclerosis: Secondary | ICD-10-CM | POA: Diagnosis present

## 2013-11-26 DIAGNOSIS — Z833 Family history of diabetes mellitus: Secondary | ICD-10-CM

## 2013-11-26 DIAGNOSIS — F329 Major depressive disorder, single episode, unspecified: Secondary | ICD-10-CM | POA: Diagnosis present

## 2013-11-26 DIAGNOSIS — Z8673 Personal history of transient ischemic attack (TIA), and cerebral infarction without residual deficits: Secondary | ICD-10-CM | POA: Diagnosis not present

## 2013-11-26 DIAGNOSIS — Z87891 Personal history of nicotine dependence: Secondary | ICD-10-CM | POA: Diagnosis not present

## 2013-11-26 DIAGNOSIS — Z8249 Family history of ischemic heart disease and other diseases of the circulatory system: Secondary | ICD-10-CM

## 2013-11-26 DIAGNOSIS — L89159 Pressure ulcer of sacral region, unspecified stage: Secondary | ICD-10-CM | POA: Diagnosis present

## 2013-11-26 DIAGNOSIS — Z933 Colostomy status: Secondary | ICD-10-CM

## 2013-11-26 DIAGNOSIS — Z993 Dependence on wheelchair: Secondary | ICD-10-CM

## 2013-11-26 DIAGNOSIS — E119 Type 2 diabetes mellitus without complications: Secondary | ICD-10-CM | POA: Diagnosis present

## 2013-11-26 DIAGNOSIS — L89150 Pressure ulcer of sacral region, unstageable: Principal | ICD-10-CM | POA: Diagnosis present

## 2013-11-26 DIAGNOSIS — E785 Hyperlipidemia, unspecified: Secondary | ICD-10-CM | POA: Diagnosis present

## 2013-11-26 DIAGNOSIS — Z823 Family history of stroke: Secondary | ICD-10-CM

## 2013-11-26 HISTORY — PX: LAPAROSCOPIC DIVERTED COLOSTOMY: SHX5892

## 2013-11-26 LAB — TYPE AND SCREEN
ABO/RH(D): B POS
Antibody Screen: NEGATIVE

## 2013-11-26 LAB — GLUCOSE, CAPILLARY
GLUCOSE-CAPILLARY: 146 mg/dL — AB (ref 70–99)
Glucose-Capillary: 141 mg/dL — ABNORMAL HIGH (ref 70–99)

## 2013-11-26 LAB — ABO/RH: ABO/RH(D): B POS

## 2013-11-26 SURGERY — CREATION, COLOSTOMY, DIVERTING, LAPAROSCOPIC
Anesthesia: General | Site: Abdomen

## 2013-11-26 MED ORDER — ENSURE PO LIQD
237.0000 mL | Freq: Two times a day (BID) | ORAL | Status: DC
  Administered 2013-11-26 – 2013-11-30 (×7): 237 mL via ORAL
  Filled 2013-11-26 (×11): qty 237

## 2013-11-26 MED ORDER — LABETALOL HCL 5 MG/ML IV SOLN
5.0000 mg | Freq: Once | INTRAVENOUS | Status: AC
  Administered 2013-11-26: 5 mg via INTRAVENOUS

## 2013-11-26 MED ORDER — PHENYLEPHRINE HCL 10 MG/ML IJ SOLN
INTRAMUSCULAR | Status: DC | PRN
  Administered 2013-11-26: 80 ug via INTRAVENOUS
  Administered 2013-11-26: 120 ug via INTRAVENOUS
  Administered 2013-11-26: 80 ug via INTRAVENOUS

## 2013-11-26 MED ORDER — LIDOCAINE HCL (CARDIAC) 20 MG/ML IV SOLN
INTRAVENOUS | Status: DC | PRN
  Administered 2013-11-26: 40 mg via INTRAVENOUS

## 2013-11-26 MED ORDER — BUPIVACAINE-EPINEPHRINE 0.25% -1:200000 IJ SOLN
INTRAMUSCULAR | Status: DC | PRN
Start: 2013-11-26 — End: 2013-11-26
  Administered 2013-11-26: 10 mL

## 2013-11-26 MED ORDER — MORPHINE SULFATE 2 MG/ML IJ SOLN: 1.0000 mg | Freq: Once | INTRAMUSCULAR | Status: AC | PRN

## 2013-11-26 MED ORDER — SACCHAROMYCES BOULARDII 250 MG PO CAPS
250.0000 mg | ORAL_CAPSULE | Freq: Two times a day (BID) | ORAL | Status: DC
  Administered 2013-11-26 – 2013-11-30 (×9): 250 mg via ORAL
  Filled 2013-11-26 (×11): qty 1

## 2013-11-26 MED ORDER — SODIUM CHLORIDE 0.9 % IV SOLN: Freq: Once | INTRAVENOUS | Status: DC

## 2013-11-26 MED ORDER — GLYCOPYRROLATE 0.2 MG/ML IJ SOLN
INTRAMUSCULAR | Status: DC | PRN
  Administered 2013-11-26: 0.6 mg via INTRAVENOUS

## 2013-11-26 MED ORDER — 0.9 % SODIUM CHLORIDE (POUR BTL) OPTIME
TOPICAL | Status: DC | PRN
  Administered 2013-11-26: 2000 mL

## 2013-11-26 MED ORDER — NEOSTIGMINE METHYLSULFATE 10 MG/10ML IV SOLN
INTRAVENOUS | Status: DC | PRN
  Administered 2013-11-26: 4 mg via INTRAVENOUS

## 2013-11-26 MED ORDER — ENOXAPARIN SODIUM 40 MG/0.4ML ~~LOC~~ SOLN
40.0000 mg | SUBCUTANEOUS | Status: DC
  Administered 2013-11-26 – 2013-11-29 (×4): 40 mg via SUBCUTANEOUS
  Filled 2013-11-26 (×5): qty 0.4

## 2013-11-26 MED ORDER — OSMOLITE 1.2 CAL PO LIQD
1000.0000 mL | ORAL | Status: DC
  Filled 2013-11-26 (×2): qty 1000

## 2013-11-26 MED ORDER — FENTANYL CITRATE 0.05 MG/ML IJ SOLN: 25.0000 ug | INTRAMUSCULAR | Status: DC | PRN

## 2013-11-26 MED ORDER — ZINC SULFATE 220 (50 ZN) MG PO CAPS
220.0000 mg | ORAL_CAPSULE | Freq: Every day | ORAL | Status: DC
  Administered 2013-11-26 – 2013-11-30 (×5): 220 mg via ORAL
  Filled 2013-11-26 (×6): qty 1

## 2013-11-26 MED ORDER — BUPIVACAINE-EPINEPHRINE (PF) 0.25% -1:200000 IJ SOLN
INTRAMUSCULAR | Status: AC
  Filled 2013-11-26: qty 30

## 2013-11-26 MED ORDER — ASPIRIN EC 81 MG PO TBEC
81.0000 mg | DELAYED_RELEASE_TABLET | Freq: Every day | ORAL | Status: DC
  Administered 2013-11-26 – 2013-11-30 (×5): 81 mg via ORAL
  Filled 2013-11-26 (×6): qty 1

## 2013-11-26 MED ORDER — CITALOPRAM HYDROBROMIDE 10 MG PO TABS
10.0000 mg | ORAL_TABLET | Freq: Every day | ORAL | Status: DC
  Administered 2013-11-26 – 2013-11-30 (×5): 10 mg via ORAL
  Filled 2013-11-26 (×6): qty 1

## 2013-11-26 MED ORDER — LACTATED RINGERS IV SOLN
INTRAVENOUS | Status: DC
  Administered 2013-11-26 (×2): via INTRAVENOUS

## 2013-11-26 MED ORDER — HYDROCODONE-ACETAMINOPHEN 5-325 MG PO TABS: 1.0000 | ORAL_TABLET | Freq: Four times a day (QID) | ORAL | Status: DC | PRN

## 2013-11-26 MED ORDER — FENTANYL CITRATE 0.05 MG/ML IJ SOLN
INTRAMUSCULAR | Status: AC
  Filled 2013-11-26: qty 5

## 2013-11-26 MED ORDER — AMLODIPINE BESYLATE 5 MG PO TABS
5.0000 mg | ORAL_TABLET | Freq: Every day | ORAL | Status: DC
  Administered 2013-11-26 – 2013-11-30 (×5): 5 mg via ORAL
  Filled 2013-11-26 (×6): qty 1

## 2013-11-26 MED ORDER — ONDANSETRON HCL 4 MG/2ML IJ SOLN
4.0000 mg | Freq: Four times a day (QID) | INTRAMUSCULAR | Status: DC | PRN
  Administered 2013-11-26: 4 mg via INTRAVENOUS
  Filled 2013-11-26: qty 2

## 2013-11-26 MED ORDER — FENTANYL CITRATE 0.05 MG/ML IJ SOLN
INTRAMUSCULAR | Status: DC | PRN
  Administered 2013-11-26: 100 ug via INTRAVENOUS
  Administered 2013-11-26: 50 ug via INTRAVENOUS

## 2013-11-26 MED ORDER — MECLIZINE HCL 25 MG PO TABS
25.0000 mg | ORAL_TABLET | Freq: Three times a day (TID) | ORAL | Status: DC | PRN
  Filled 2013-11-26: qty 1

## 2013-11-26 MED ORDER — ONDANSETRON HCL 4 MG PO TABS: 4.0000 mg | ORAL_TABLET | Freq: Four times a day (QID) | ORAL | Status: DC | PRN

## 2013-11-26 MED ORDER — LABETALOL HCL 5 MG/ML IV SOLN
INTRAVENOUS | Status: AC
  Filled 2013-11-26: qty 4

## 2013-11-26 MED ORDER — PHENYLEPHRINE 40 MCG/ML (10ML) SYRINGE FOR IV PUSH (FOR BLOOD PRESSURE SUPPORT)
PREFILLED_SYRINGE | INTRAVENOUS | Status: AC
  Filled 2013-11-26: qty 10

## 2013-11-26 MED ORDER — ROCURONIUM BROMIDE 50 MG/5ML IV SOLN
INTRAVENOUS | Status: AC
  Filled 2013-11-26: qty 1

## 2013-11-26 MED ORDER — MEPERIDINE HCL 25 MG/ML IJ SOLN: 6.2500 mg | INTRAMUSCULAR | Status: DC | PRN

## 2013-11-26 MED ORDER — HYDROMORPHONE HCL 1 MG/ML IJ SOLN
1.0000 mg | INTRAMUSCULAR | Status: DC | PRN
  Administered 2013-11-26 – 2013-11-30 (×7): 1 mg via INTRAVENOUS
  Filled 2013-11-26 (×7): qty 1

## 2013-11-26 MED ORDER — MIDAZOLAM HCL 5 MG/5ML IJ SOLN
INTRAMUSCULAR | Status: DC | PRN
  Administered 2013-11-26: 2 mg via INTRAVENOUS

## 2013-11-26 MED ORDER — FENTANYL 25 MCG/HR TD PT72
25.0000 ug | MEDICATED_PATCH | TRANSDERMAL | Status: DC
  Administered 2013-11-26 – 2013-11-29 (×2): 25 ug via TRANSDERMAL
  Filled 2013-11-26 (×2): qty 1

## 2013-11-26 MED ORDER — GLYCOPYRROLATE 0.2 MG/ML IJ SOLN
INTRAMUSCULAR | Status: AC
  Filled 2013-11-26: qty 3

## 2013-11-26 MED ORDER — NEOSTIGMINE METHYLSULFATE 10 MG/10ML IV SOLN
INTRAVENOUS | Status: AC
  Filled 2013-11-26: qty 1

## 2013-11-26 MED ORDER — METOPROLOL TARTRATE 1 MG/ML IV SOLN
5.0000 mg | Freq: Four times a day (QID) | INTRAVENOUS | Status: DC | PRN
  Administered 2013-11-26 – 2013-11-28 (×3): 5 mg via INTRAVENOUS
  Filled 2013-11-26 (×6): qty 5

## 2013-11-26 MED ORDER — METOPROLOL TARTRATE 25 MG PO TABS
25.0000 mg | ORAL_TABLET | Freq: Two times a day (BID) | ORAL | Status: DC
  Administered 2013-11-26 – 2013-11-30 (×9): 25 mg via ORAL
  Filled 2013-11-26 (×11): qty 1

## 2013-11-26 MED ORDER — ONDANSETRON HCL 4 MG/2ML IJ SOLN
INTRAMUSCULAR | Status: AC
  Filled 2013-11-26: qty 2

## 2013-11-26 MED ORDER — PROPOFOL 10 MG/ML IV BOLUS
INTRAVENOUS | Status: DC | PRN
  Administered 2013-11-26: 50 mg via INTRAVENOUS
  Administered 2013-11-26: 100 mg via INTRAVENOUS

## 2013-11-26 MED ORDER — HYDROMORPHONE HCL 1 MG/ML IJ SOLN: 1.0000 mg | INTRAMUSCULAR | Status: DC | PRN

## 2013-11-26 MED ORDER — CEFAZOLIN SODIUM-DEXTROSE 2-3 GM-% IV SOLR
2.0000 g | Freq: Once | INTRAVENOUS | Status: DC
  Filled 2013-11-26: qty 50

## 2013-11-26 MED ORDER — DEXAMETHASONE SODIUM PHOSPHATE 10 MG/ML IJ SOLN
INTRAMUSCULAR | Status: DC | PRN
  Administered 2013-11-26: 4 mg via INTRAVENOUS

## 2013-11-26 MED ORDER — PROMETHAZINE HCL 25 MG/ML IJ SOLN: 6.2500 mg | INTRAMUSCULAR | Status: DC | PRN

## 2013-11-26 MED ORDER — PRO-STAT SUGAR FREE PO LIQD
30.0000 mL | Freq: Every day | ORAL | Status: DC
  Administered 2013-11-26 – 2013-11-30 (×5): 30 mL
  Filled 2013-11-26 (×4): qty 30

## 2013-11-26 MED ORDER — POTASSIUM CHLORIDE IN NACL 20-0.9 MEQ/L-% IV SOLN
INTRAVENOUS | Status: DC
  Administered 2013-11-26 – 2013-11-29 (×3): via INTRAVENOUS
  Filled 2013-11-26 (×6): qty 1000

## 2013-11-26 MED ORDER — LIDOCAINE HCL (CARDIAC) 20 MG/ML IV SOLN
INTRAVENOUS | Status: AC
  Filled 2013-11-26: qty 5

## 2013-11-26 MED ORDER — ROCURONIUM BROMIDE 100 MG/10ML IV SOLN
INTRAVENOUS | Status: DC | PRN
  Administered 2013-11-26: 30 mg via INTRAVENOUS

## 2013-11-26 MED ORDER — BACLOFEN 5 MG HALF TABLET
5.0000 mg | ORAL_TABLET | Freq: Three times a day (TID) | ORAL | Status: DC
  Administered 2013-11-26 – 2013-11-30 (×12): 5 mg via ORAL
  Filled 2013-11-26 (×14): qty 1

## 2013-11-26 MED ORDER — METFORMIN HCL 500 MG PO TABS
500.0000 mg | ORAL_TABLET | Freq: Every day | ORAL | Status: DC
  Administered 2013-11-27 – 2013-11-30 (×4): 500 mg via ORAL
  Filled 2013-11-26 (×5): qty 1

## 2013-11-26 MED ORDER — MIDAZOLAM HCL 2 MG/2ML IJ SOLN
INTRAMUSCULAR | Status: AC
  Filled 2013-11-26: qty 2

## 2013-11-26 MED ORDER — PHENYLEPHRINE HCL 10 MG/ML IJ SOLN
10.0000 mg | INTRAVENOUS | Status: DC | PRN
  Administered 2013-11-26: 20 ug/min via INTRAVENOUS

## 2013-11-26 SURGICAL SUPPLY — 80 items
APPLIER CLIP ROT 10 11.4 M/L (STAPLE)
BANDAGE ADH SHEER 1  50/CT (GAUZE/BANDAGES/DRESSINGS) ×6 IMPLANT
BENZOIN TINCTURE PRP APPL 2/3 (GAUZE/BANDAGES/DRESSINGS) ×3 IMPLANT
BLADE SURG 10 STRL SS (BLADE) ×3 IMPLANT
BLADE SURG ROTATE 9660 (MISCELLANEOUS) IMPLANT
CANISTER SUCTION 2500CC (MISCELLANEOUS) ×3 IMPLANT
CELLS DAT CNTRL 66122 CELL SVR (MISCELLANEOUS) IMPLANT
CHLORAPREP W/TINT 26ML (MISCELLANEOUS) ×3 IMPLANT
CLIP APPLIE ROT 10 11.4 M/L (STAPLE) IMPLANT
CLOSURE WOUND 1/2 X4 (GAUZE/BANDAGES/DRESSINGS) ×1
COVER SURGICAL LIGHT HANDLE (MISCELLANEOUS) ×6 IMPLANT
DRAPE LAPAROSCOPIC ABDOMINAL (DRAPES) ×3 IMPLANT
DRAPE PROXIMA HALF (DRAPES) IMPLANT
DRAPE UTILITY 15X26 W/TAPE STR (DRAPE) IMPLANT
DRAPE WARM FLUID 44X44 (DRAPE) ×3 IMPLANT
DRSG OPSITE POSTOP 4X10 (GAUZE/BANDAGES/DRESSINGS) IMPLANT
DRSG OPSITE POSTOP 4X8 (GAUZE/BANDAGES/DRESSINGS) IMPLANT
ELECT BLADE 6.5 EXT (BLADE) IMPLANT
ELECT CAUTERY BLADE 6.4 (BLADE) ×3 IMPLANT
ELECT REM PT RETURN 9FT ADLT (ELECTROSURGICAL) ×3
ELECTRODE REM PT RTRN 9FT ADLT (ELECTROSURGICAL) ×1 IMPLANT
GAUZE SPONGE 2X2 8PLY STRL LF (GAUZE/BANDAGES/DRESSINGS) ×1 IMPLANT
GAUZE SPONGE 4X4 12PLY STRL (GAUZE/BANDAGES/DRESSINGS) ×3 IMPLANT
GEL ULTRASOUND 20GR AQUASONIC (MISCELLANEOUS) IMPLANT
GLOVE BIO SURGEON STRL SZ 6.5 (GLOVE) ×2 IMPLANT
GLOVE BIO SURGEONS STRL SZ 6.5 (GLOVE) ×1
GLOVE BIOGEL PI IND STRL 7.0 (GLOVE) ×3 IMPLANT
GLOVE BIOGEL PI IND STRL 8 (GLOVE) ×1 IMPLANT
GLOVE BIOGEL PI INDICATOR 7.0 (GLOVE) ×6
GLOVE BIOGEL PI INDICATOR 8 (GLOVE) ×2
GLOVE SURG SIGNA 7.5 PF LTX (GLOVE) ×6 IMPLANT
GLOVE SURG SS PI 7.0 STRL IVOR (GLOVE) ×3 IMPLANT
GOWN STRL REUS W/ TWL LRG LVL3 (GOWN DISPOSABLE) ×2 IMPLANT
GOWN STRL REUS W/ TWL XL LVL3 (GOWN DISPOSABLE) ×1 IMPLANT
GOWN STRL REUS W/TWL LRG LVL3 (GOWN DISPOSABLE) ×4
GOWN STRL REUS W/TWL XL LVL3 (GOWN DISPOSABLE) ×2
KIT BASIN OR (CUSTOM PROCEDURE TRAY) ×3 IMPLANT
KIT OSTOMY DRAINABLE 2.75 STR (WOUND CARE) ×3 IMPLANT
LEGGING LITHOTOMY PAIR STRL (DRAPES) IMPLANT
NS IRRIG 1000ML POUR BTL (IV SOLUTION) ×6 IMPLANT
PAD ARMBOARD 7.5X6 YLW CONV (MISCELLANEOUS) ×6 IMPLANT
PENCIL BUTTON HOLSTER BLD 10FT (ELECTRODE) ×3 IMPLANT
RTRCTR WOUND ALEXIS 18CM MED (MISCELLANEOUS)
SCALPEL HARMONIC ACE (MISCELLANEOUS) ×3 IMPLANT
SCISSORS LAP 5X35 DISP (ENDOMECHANICALS) ×3 IMPLANT
SET IRRIG TUBING LAPAROSCOPIC (IRRIGATION / IRRIGATOR) IMPLANT
SLEEVE ENDOPATH XCEL 5M (ENDOMECHANICALS) ×3 IMPLANT
SPECIMEN JAR LARGE (MISCELLANEOUS) ×3 IMPLANT
SPONGE GAUZE 2X2 STER 10/PKG (GAUZE/BANDAGES/DRESSINGS) ×2
STAPLER PROXIMATE 75MM BLUE (STAPLE) ×3 IMPLANT
STAPLER VISISTAT 35W (STAPLE) ×3 IMPLANT
STRIP CLOSURE SKIN 1/2X4 (GAUZE/BANDAGES/DRESSINGS) ×2 IMPLANT
SUCTION POOLE TIP (SUCTIONS) ×3 IMPLANT
SURGILUBE 2OZ TUBE FLIPTOP (MISCELLANEOUS) IMPLANT
SUT MON AB 4-0 PC3 18 (SUTURE) ×3 IMPLANT
SUT PDS AB 1 TP1 96 (SUTURE) ×6 IMPLANT
SUT PROLENE 2 0 CT2 30 (SUTURE) IMPLANT
SUT PROLENE 2 0 KS (SUTURE) IMPLANT
SUT SILK 2 0 SH CR/8 (SUTURE) ×3 IMPLANT
SUT SILK 2 0 TIES 10X30 (SUTURE) ×3 IMPLANT
SUT SILK 3 0 SH CR/8 (SUTURE) ×3 IMPLANT
SUT SILK 3 0 TIES 10X30 (SUTURE) ×3 IMPLANT
SUT VIC AB 3-0 SH 18 (SUTURE) ×3 IMPLANT
SYR BULB IRRIGATION 50ML (SYRINGE) ×3 IMPLANT
SYS LAPSCP GELPORT 120MM (MISCELLANEOUS)
SYSTEM LAPSCP GELPORT 120MM (MISCELLANEOUS) IMPLANT
TAPE CLOTH SURG 4X10 WHT LF (GAUZE/BANDAGES/DRESSINGS) ×3 IMPLANT
TOWEL OR 17X26 10 PK STRL BLUE (TOWEL DISPOSABLE) ×3 IMPLANT
TRAY FOLEY CATH 16FRSI W/METER (SET/KITS/TRAYS/PACK) ×3 IMPLANT
TRAY LAPAROSCOPIC (CUSTOM PROCEDURE TRAY) ×3 IMPLANT
TRAY PROCTOSCOPIC FIBER OPTIC (SET/KITS/TRAYS/PACK) IMPLANT
TROCAR XCEL 12X100 BLDLESS (ENDOMECHANICALS) IMPLANT
TROCAR XCEL BLUNT TIP 100MML (ENDOMECHANICALS) IMPLANT
TROCAR XCEL NON-BLD 11X100MML (ENDOMECHANICALS) IMPLANT
TROCAR XCEL NON-BLD 5MMX100MML (ENDOMECHANICALS) ×3 IMPLANT
TUBE CONNECTING 12'X1/4 (SUCTIONS) ×1
TUBE CONNECTING 12X1/4 (SUCTIONS) ×2 IMPLANT
TUBING FILTER THERMOFLATOR (ELECTROSURGICAL) ×3 IMPLANT
TUBING INSUFFLATION (TUBING) ×3 IMPLANT
YANKAUER SUCT BULB TIP NO VENT (SUCTIONS) ×3 IMPLANT

## 2013-11-26 NOTE — Op Note (Signed)
LAPAROSCOPIC ASSISTED DESCENDING END COLOSTOMY  Procedure Note  [name] [name] 11/26/2013   Pre-op Diagnosis: multiple sclerosis, sacral decub     Post-op Diagnosis: same  Procedure(s): LAPAROSCOPIC ASSISTED DESCENDING END COLOSTOMY  Surgeon(s): Abigail Miyamotoouglas Shakala Marlatt, MD  Anesthesia: General  Staff:  Circulator: Ivor CostaLaurel L Edwards, RN Scrub Person: Leighton ParodyAnn Marie Wilson, CST; Megan Day Cavanaugh, RN Circulator Assistant: Netta CorriganAnita R Selby, RN; Jolinda Croakrystal B Bigelow, RN  Estimated Blood Loss: Minimal                         Abigail MiyamotoBLACKMAN,[name]   Date: 11/26/2013  Time: 9:42 AM

## 2013-11-26 NOTE — Transfer of Care (Signed)
Immediate Anesthesia Transfer of Care Note  Patient: [name] [name]  Procedure(s) Performed: Procedure(s): LAPAROSCOPIC ASSISTED DESCENDING END COLOSTOMY (N/A)  Patient Location: PACU  Anesthesia Type:General  Level of Consciousness: awake, alert  and oriented  Airway & Oxygen Therapy: Patient Spontanous Breathing and Patient connected to face mask oxygen  Post-op Assessment: Report given to PACU RN, Post -op Vital signs reviewed and stable and Patient moving all extremities X 4  Post vital signs: Reviewed and stable  Complications: No apparent anesthesia complications

## 2013-11-26 NOTE — Op Note (Signed)
[name], HERDA NO.:  [id number]  MEDICAL RECORD NO.:  [id number]  LOCATION:  [facility]                         FACILITY:  [facility]  PHYSICIAN:  Abigail Miyamoto, M.D. DATE OF BIRTH:  May 27, 1970  DATE OF PROCEDURE:  11/26/2013 DATE OF DISCHARGE:                              OPERATIVE REPORT   PREOPERATIVE DIAGNOSES:  Multiple sclerosis, sacral decubitus.  POSTOPERATIVE DIAGNOSES:  Multiple sclerosis, sacral decubitus.  PROCEDURE:  Laparoscopic-assisted descending end colostomy.  SURGEON:  Abigail Miyamoto, M.D.  ANESTHESIA:  General.  ESTIMATED BLOOD LOSS:  Minimal.  INDICATIONS:  This is a 43 year old female with severe multiple sclerosis who is wheelchair bound.  She has developed a severe sacral decubitus and needs an end-colostomy for wound care issues.  She already has a gastrostomy tube inserted.  PROCEDURE IN DETAIL:  The patient was brought to the operative room, identified as [name] [name].  She was placed supine on the operating table and general anesthesia was induced.  Her abdomen was then prepped and draped in usual sterile fashion.  An ostomy site had already been marked by the ostomy nurses.  After abdomen was prepped and draped in usual sterile fashion, I made a small incision in the right upper quadrant.  I used a 5-mm trocar and Optiview to slowly traverse to the level of the abdominal wall and gain entrance to the peritoneal cavity. Insufflation of the abdomen was then begun.  I placed another 5-mm port in the patient's right lower quadrant and then visualized the descending colon.  The patient had a redundant loop of sigmoid colon, which easily stretched the abdominal wall.  I made an elliptical incision in the patient's left lower quadrant at the site of the marked ostomy.  I took this down to the fascia, which was then opened in a cruciate fashion. The underlying muscles were then separated and the peritoneum was opened.  I then  placed a Babcock clamp into the abdomen, pulled out the descending and sigmoid colon as the colostomy.  I then deflated the abdomen.  I transected the colon with a GIA 75 stapler.  I then took down the mesentery with the Harmonic Scalpel.  I then placed the distal end back into the abdominal cavity.  I then opened up the staple line of the proximal end and matured it circumferentially with interrupted 3-0 Vicryl sutures creating the colostomy.  A pink well-perfused ostomy appeared to be achieved.  I then closed the 2 trocar sites after removing the 5 mm ports with 4-0 Monocryl sutures.  Steri-Strips were then applied. An ostomy appliance was placed to the colostomy.  The patient tolerated the procedure well.  All the counts were correct at the end of procedure.  The patient was then extubated in the operating room and taken in a stable condition to the recovery room.     Abigail Miyamoto, M.D.     DB/MEDQ  D:  11/26/2013  T:  11/26/2013  Job:  [id number]

## 2013-11-26 NOTE — Progress Notes (Signed)
Notified Dr.Manny of elevated BP and HR. Labetalol 5mg  IV ordered. Will give and cont to monitor

## 2013-11-26 NOTE — Anesthesia Postprocedure Evaluation (Signed)
  Anesthesia Post-op Note  Patient: [name] [name]  Procedure(s) Performed: Procedure(s): LAPAROSCOPIC ASSISTED DESCENDING END COLOSTOMY (N/A)  Patient Location: PACU  Anesthesia Type:General  Level of Consciousness: awake, alert  and oriented  Airway and Oxygen Therapy: Patient Spontanous Breathing and Patient connected to nasal cannula oxygen  Post-op Pain: mild  Post-op Assessment: Post-op Vital signs reviewed, Patient's Cardiovascular Status Stable, Respiratory Function Stable, Patent Airway and No signs of Nausea or vomiting  Post-op Vital Signs: Reviewed and stable  Last Vitals:  Filed Vitals:   11/26/13 1015  BP: 161/93  Pulse: 99  Temp:   Resp: 14    Complications: No apparent anesthesia complications

## 2013-11-26 NOTE — Anesthesia Procedure Notes (Signed)
Procedure Name: Intubation Date/Time: 11/26/2013 8:46 AM Performed by: Elon Alas Pre-anesthesia Checklist: Patient identified, Timeout performed, Emergency Drugs available, Suction available and Patient being monitored Patient Re-evaluated:Patient Re-evaluated prior to inductionOxygen Delivery Method: Circle system utilized Preoxygenation: Pre-oxygenation with 100% oxygen Intubation Type: IV induction Grade View: Grade II Tube size: 7.0 mm Number of attempts: 3 Airway Equipment and Method: Stylet and Video-laryngoscopy Placement Confirmation: CO2 detector,  positive ETCO2,  ETT inserted through vocal cords under direct vision and breath sounds checked- equal and bilateral Secured at: 21 cm Tube secured with: Tape Dental Injury: Teeth and Oropharynx as per pre-operative assessment and Bloody posterior oropharynx  Comments: DLX1 by CRNA with MAC 3 - grade 4 view, DLX1 by Dr. Berneice Heinrich with MAC 3 - grade 4 view, DLx1 with glidescope - grade 2 view but very anterior - had to use regular stylet to achieve intubation.  Not enough curve with glidescope stylet. A little amount of blood noted in posterior pharynx.

## 2013-11-26 NOTE — Anesthesia Preprocedure Evaluation (Addendum)
Anesthesia Evaluation  Patient identified by MRN, date of birth, ID band Patient awake    Reviewed: Allergy & Precautions, H&P , NPO status , Patient's Chart, lab work & pertinent test results  Airway Mallampati: II   Neck ROM: Limited    Dental   Pulmonary former smoker,  breath sounds clear to auscultation        Cardiovascular hypertension, Pt. 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    Peds  Hematology   Anesthesia Other Findings   Reproductive/Obstetrics                            Anesthesia Physical Anesthesia Plan  ASA: III  Anesthesia Plan: General   Post-op Pain Management:    Induction: Intravenous  Airway Management Planned: Oral ETT  Additional Equipment:   Intra-op Plan:   Post-operative Plan: Extubation in OR  Informed Consent: I have reviewed the patients History and Physical, chart, labs and discussed the procedure including the risks, benefits and alternatives for the proposed anesthesia with the patient or authorized representative who has indicated his/her understanding and acceptance.     Plan Discussed with:   Anesthesia Plan Comments: (Severe MS, wheel chair bound, PEG, supplemental feedings, sacral decubitus, albumin ok)        Anesthesia Quick Evaluation

## 2013-11-26 NOTE — Progress Notes (Signed)
PA made aware of current BP. No orders at this time.  Will continue to monitor. Vanice Sarahhompson, Mallory Schaad L

## 2013-11-26 NOTE — Interval H&P Note (Signed)
History and Physical Interval Note: no change in H and P  11/26/2013 8:00 AM  [name] [name]  has presented today for surgery, with the diagnosis of multiple sclerosis, sacral decub  The various methods of treatment have been discussed with the patient and family. After consideration of risks, benefits and other options for treatment, the patient has consented to  Procedure(s): LAPAROSCOPIC ASSISTED COLOSTOMY (N/A) as a surgical intervention .  The patient's history has been reviewed, patient examined, no change in status, stable for surgery.  I have reviewed the patient's chart and labs.  Questions were answered to the patient's satisfaction.     Nikoloz Huy A

## 2013-11-27 ENCOUNTER — Encounter (HOSPITAL_COMMUNITY): Payer: Self-pay | Admitting: Surgery

## 2013-11-27 ENCOUNTER — Ambulatory Visit: Payer: Self-pay | Admitting: Neurology

## 2013-11-27 LAB — BASIC METABOLIC PANEL
Anion gap: 16 — ABNORMAL HIGH (ref 5–15)
BUN: 12 mg/dL (ref 6–23)
CO2: 23 mEq/L (ref 19–32)
Calcium: 9.6 mg/dL (ref 8.4–10.5)
Chloride: 102 mEq/L (ref 96–112)
Creatinine, Ser: 0.34 mg/dL — ABNORMAL LOW (ref 0.50–1.10)
GFR calc Af Amer: >90 mL/min (ref >90)
GFR calc non Af Amer: >90 mL/min (ref >90)
GLUCOSE: 125 mg/dL — AB (ref 70–99)
POTASSIUM: 3.9 meq/L (ref 3.7–5.3)
Sodium: 141 mEq/L (ref 137–147)

## 2013-11-27 LAB — MRSA PCR SCREENING: MRSA by PCR: POSITIVE — AB

## 2013-11-27 LAB — CBC
HEMATOCRIT: 32 % — AB (ref 36.0–46.0)
HEMOGLOBIN: 9.9 g/dL — AB (ref 12.0–15.0)
MCH: 27.5 pg (ref 26.0–34.0)
MCHC: 30.9 g/dL (ref 30.0–36.0)
MCV: 88.9 fL (ref 78.0–100.0)
Platelets: 448 10*3/uL — ABNORMAL HIGH (ref 150–400)
RBC: 3.6 MIL/uL — ABNORMAL LOW (ref 3.87–5.11)
RDW: 15.4 % (ref 11.5–15.5)
WBC: 16.3 10*3/uL — ABNORMAL HIGH (ref 4.0–10.5)

## 2013-11-27 MED ORDER — WHITE PETROLATUM GEL
Status: AC
  Administered 2013-11-27: 0.2
  Filled 2013-11-27: qty 5

## 2013-11-27 NOTE — Care Management Note (Signed)
  Page 1 of 1   11/30/2013     12:54:44 PM CARE MANAGEMENT NOTE 11/30/2013  Patient:  [name] [name], [name] [name]   Account Number:  [id number]  Date Initiated:  11/27/2013  Documentation initiated by:  Ronny Flurry  Subjective/Objective Assessment:     Action/Plan:   Anticipated DC Date:     Anticipated DC Plan:           Choice offered to / List presented to:             Status of service:   Medicare Important Message given?  YES (If response is "NO", the following Medicare IM given date fields will be blank) Date Medicare IM given:  11/27/2013 Medicare IM given by:  Ronny Flurry Date Additional Medicare IM given:  11/30/2013 Additional Medicare IM given by:  Ronny Flurry  Discharge Disposition:    Per UR Regulation:    If discussed at Long Length of Stay Meetings, dates discussed:    Comments:

## 2013-11-27 NOTE — Consult Note (Signed)
WOC ostomy consult note Stoma type/location: LLQ, end colostomy.  Stomal assessment/size: 1 3/4" round, budded, moist, and pink Peristomal assessment: intact Treatment options for stomal/peristomal skin: may need to add 2" barrier around stoma to preserve seal Output flatus only Ostomy pouching: 1pc placed in side draining position for staff since patient will not be participating in care and family will not be learning the care at this time  Education provided: pt unable to participate, plans for return to SNF at dc. Ostomy performed as diversion to assist with wound healing, ordered air mattress for pressure redistribution and normal saline dressings for the sacrum per her current POC.  Requested nursing staff to measure wounds at the time of the dressing change today.    WOC team will follow along with you for ostomy care Ramiz Turpin [facility] RN,CWOCN

## 2013-11-27 NOTE — Evaluation (Signed)
Physical Therapy Evaluation Patient Details Name: [name] [name] MRN: [id number] DOB: 02-Jun-1970 Today's Date: 11/27/2013   History of Present Illness  Patient is a 43 yo female admitted 11/26/13 for colostomy.  PMH:  severe MS, sacral decubitus, CVA, depression, DM, HTN  Clinical Impression  Patient presents from SNF with problems listed below.  Patient was non-ambulatory pta.  Recommend use of lift equipment for OOB.  Recommend return to SNF for further PT assessment/therapy at that venue of care.  Acute PT will sign off at this time.    Follow Up Recommendations SNF;Supervision/Assistance - 24 hour    Equipment Recommendations  Wheelchair (measurements PT);Wheelchair cushion (measurements PT)    Recommendations for Other Services       Precautions / Restrictions Precautions Precautions: Fall Restrictions Weight Bearing Restrictions: No      Mobility  Bed Mobility Overal bed mobility: Needs Assistance Bed Mobility: Rolling Rolling: Max assist         General bed mobility comments: Patient requiring assist for all bed mobility.  Recommend lift equipment for OOB.  Transfers                 General transfer comment: Did not test.  Patient becoming upset/tearful with further PT evaluation.  Ambulation/Gait                Stairs            Wheelchair Mobility    Modified Rankin (Stroke Patients Only)       Balance                                             Pertinent Vitals/Pain Pain Assessment: No/denies pain    Home Living Family/patient expects to be discharged to:: Skilled nursing facility                 Additional Comments: Per chart, son states pt owns personal bedside commode and raised toilet seat.  Patient is poor historian.    Prior Function Level of Independence: Needs assistance   Gait / Transfers Assistance Needed: Bedridden per chart.  ADL's / Homemaking Assistance Needed: Assist for all  ADL's        Hand Dominance   Dominant Hand: Right    Extremity/Trunk Assessment   Upper Extremity Assessment: RUE deficits/detail;LUE deficits/detail RUE Deficits / Details: Strength grossly 3-/5 at shoulder and 3/5 elbow and hand movements.  Increased tone noted.     LUE Deficits / Details: Strength grossly 2+/5 at shoulder and 3-/5 elbow and hand movements.  Increased tone noted.   Lower Extremity Assessment: RLE deficits/detail;LLE deficits/detail RLE Deficits / Details: Strength grossly 2/5 - Difficult to assess due to cognition. LLE Deficits / Details: Strength grossly 2/5 - Difficult to assess due to cognition.     Communication   Communication: Expressive difficulties  Cognition Arousal/Alertness: Lethargic;Suspect due to medications Behavior During Therapy: Anxious;Flat affect (Tearful when uncovering LE's) Overall Cognitive Status: No family/caregiver present to determine baseline cognitive functioning (Per chart, patient with cognitive impairments)       Memory: Decreased short-term memory              General Comments      Exercises        Assessment/Plan    PT Assessment All further PT needs can be met in the next venue of care  PT Diagnosis  Difficulty walking;Generalized weakness;Altered mental status   PT Problem List Decreased strength;Decreased activity tolerance;Decreased balance;Decreased mobility;Decreased cognition;Decreased coordination;Impaired tone  PT Treatment Interventions     PT Goals (Current goals can be found in the Care Plan section) Acute Rehab PT Goals Patient Stated Goal: None stated    Frequency     Barriers to discharge        Co-evaluation               End of Session   Activity Tolerance: Patient limited by fatigue;Treatment limited secondary to agitation Patient left: in bed;with call bell/phone within reach           Time: 7639-4320 PT Time Calculation (min): 12 min   Charges:   PT  Evaluation $Initial PT Evaluation Tier I: 1 Procedure     PT G CodesDespina [name] 11/27/2013, 7:03 PM  [name] [name]. [name] [name], [name] [name] Pager [phone]

## 2013-11-27 NOTE — Progress Notes (Signed)
INITIAL NUTRITION ASSESSMENT  DOCUMENTATION CODES Per approved criteria  -Not Applicable   INTERVENTION: Once diet advanced, Continue Ensure Complete po BID, each supplement provides 350 kcal and 13 grams of protein  Recommend initiation of Osmolite 1.2 @ 20 ml/hr via G-tube and increase by 10 ml every 4 hours to goal rate of 55 ml/hr.   30 ml Prostat once daily.    Tube feeding regimen provides 1684 kcal (100% of needs), 88 grams of protein, and 1082 ml of H2O.   NUTRITION DIAGNOSIS: Inadequate oral intake related to Multiple Sclerosis as evidenced by poor po.   Goal: Pt to meet >/= 90% of their estimated nutrition needs   Monitor:  Weight trend, I/O's, TF initiation and management, labs  Reason for Assessment: Tube Feeding  43 y.o. female  Admitting Dx: <principal problem not specified>  ASSESSMENT: 43 year old female who presents for a pre-op visit. This is an unfortunate patient who is known to our service with severe multiple sclerosis. She is currently in a rehabilitation facility and is quite deconditioned and wheelchair bound. She has a known sacral decubitus. She recently had a G-tube placed by IR. Her medical physicians are requesting colostomy placement.  - TF has not been started at this time. Spoke with RN who said that pt ate jello and broth today. Pt reported that she was thirsty, but refused water and juice. She reports that she does not feel hungry. PO is recorded as 0-10%. - Pt had colostomy placed 11/05. - Weight is stable over the past several months. - Nutrition-focused physical exam shows mild wasting of temples and clavicle.   Labs: CBGs: 91-146 Na, K, and BUN WNL  Height: Ht Readings from Last 1 Encounters:  11/26/13 5\' 1"  (1.549 m)    Weight: Wt Readings from Last 1 Encounters:  11/26/13 113 lb (51.256 kg)    Ideal Body Weight: 47.8 kg  % Ideal Body Weight: 107%  Wt Readings from Last 10 Encounters:  11/26/13 113 lb (51.256 kg)   09/30/13 113 lb (51.256 kg)  09/04/13 113 lb 1.5 oz (51.3 kg)  08/13/13 117 lb 11.2 oz (53.388 kg)  07/22/13 119 lb 11.2 oz (54.296 kg)  07/07/13 132 lb 4.4 oz (60 kg)  06/07/13 127 lb 11.2 oz (57.924 kg)  05/10/13 127 lb 6.4 oz (57.788 kg)  04/29/13 116 lb (52.617 kg)  09/17/12 111 lb (50.349 kg)    Usual Body Weight: 113 lbs  % Usual Body Weight: 100%  BMI:  Body mass index is 21.36 kg/(m^2).  Estimated Nutritional Needs: Kcal: 1500-1700 Protein: 75-90 g Fluid: 1.7 L/day  Skin: unstageable pressure ulcer on sacrum  Diet Order: Diet clear liquid  EDUCATION NEEDS: -Education not appropriate at this time   Intake/Output Summary (Last 24 hours) at 11/27/13 1307 Last data filed at 11/27/13 1010  Gross per 24 hour  Intake   1146 ml  Output    100 ml  Net   1046 ml    Last BM: pt had colostomy placed 11/5 with only flatus output   Labs:   Recent Labs Lab 11/27/13 0500  NA 141  K 3.9  CL 102  CO2 23  BUN 12  CREATININE 0.34*  CALCIUM 9.6  GLUCOSE 125*    CBG (last 3)   Recent Labs  11/26/13 0838 11/26/13 1001  GLUCAP 146* 141*    Scheduled Meds: . amLODipine  5 mg Oral Daily  . aspirin EC  81 mg Oral Daily  . baclofen  5 mg Oral TID  . citalopram  10 mg Oral Daily  . enoxaparin (LOVENOX) injection  40 mg Subcutaneous Q24H  . ENSURE  237 mL Oral BID BM  . feeding supplement (PRO-STAT SUGAR FREE 64)  30 mL Per Tube Daily  . fentaNYL  25 mcg Transdermal Q72H  . metFORMIN  500 mg Oral Q breakfast  . metoprolol tartrate  25 mg Oral BID  . saccharomyces boulardii  250 mg Oral BID  . zinc sulfate  220 mg Oral Daily    Continuous Infusions: . 0.9 % NaCl with KCl 20 mEq / L Stopped (11/27/13 1209)  . feeding supplement (OSMOLITE 1.2 CAL) Stopped (11/26/13 1415)  . lactated ringers 10 mL/hr at 11/26/13 4081    Past Medical History  Diagnosis Date  . Multiple sclerosis   . HYPERTENSION 09/30/2006  . TIA (transient ischemic attack)   . TOBACCO  USER 09/30/2006    Qualifier: Diagnosis of  By: Delrae Alfred MD, Lanora Manis    . Multiple sclerosis, relapsing-remitting 05/29/2012  . DYSLIPIDEMIA 09/30/2006    Qualifier: Diagnosis of  By: Delrae Alfred MD, Lanora Manis    . BENIGN POSITIONAL VERTIGO 01/27/2010    Qualifier: Diagnosis of  By: Delrae Alfred MD, Lanora Manis    . Depression   . DIABETES MELLITUS, TYPE II 09/30/2006    Qualifier: Diagnosis of  By: Delrae Alfred MD, Lanora Manis    . Neuromuscular disorder     MS  . Stroke     pt denies this hx on 09/02/2013  . Sacral wound     Past Surgical History  Procedure Laterality Date  . Peg tube placement  2015    Emmaline Kluver RD, LDN

## 2013-11-27 NOTE — Progress Notes (Signed)
1 Day Post-Op  Subjective: Comfortable Denies nausea  Objective: Vital signs in last 24 hours: Temp:  [98.1 F (36.7 C)-99.1 F (37.3 C)] 98.3 F (36.8 C) (11/06 1328) Pulse Rate:  [104-121] 104 (11/06 1510) Resp:  [12-16] 16 (11/06 1328) BP: (127-162)/(85-105) 159/99 mmHg (11/06 1510) SpO2:  [100 %] 100 % (11/06 1510)    Intake/Output from previous day: 11/05 0701 - 11/06 0700 In: 2586 [I.V.:2586] Out: 285 [Urine:275; Blood:10] Intake/Output this shift: Total I/O In: 120 [P.O.:120] Out: -   Lungs clear Abdomen soft, ostomy pink, gas in bag  Lab Results:   Recent Labs  11/27/13 0500  WBC 16.3*  HGB 9.9*  HCT 32.0*  PLT 448*   BMET  Recent Labs  11/27/13 0500  NA 141  K 3.9  CL 102  CO2 23  GLUCOSE 125*  BUN 12  CREATININE 0.34*  CALCIUM 9.6   PT/INR No results for input(s): LABPROT, INR in the last 72 hours. ABG No results for input(s): PHART, HCO3 in the last 72 hours.  Invalid input(s): PCO2, PO2  Studies/Results: No results found.  Anti-infectives: Anti-infectives    Start     Dose/Rate Route Frequency Ordered Stop   11/26/13 0730  ceFAZolin (ANCEF) IVPB 2 g/50 mL premix  Status:  Discontinued     2 g100 mL/hr over 30 Minutes Intravenous  Once 11/26/13 0715 11/26/13 1231   11/26/13 0600  ceFAZolin (ANCEF) IVPB 2 g/50 mL premix     2 g100 mL/hr over 30 Minutes Intravenous On call to O.R. 11/25/13 1355 11/26/13 0851      Assessment/Plan: s/p Procedure(s): LAPAROSCOPIC ASSISTED DESCENDING END COLOSTOMY (N/A)  Continue wound care Will start tube feeds in the morning  LOS: 1 day    Biviana Saddler A 11/27/2013

## 2013-11-28 LAB — BASIC METABOLIC PANEL
Anion gap: 17 — ABNORMAL HIGH (ref 5–15)
BUN: 5 mg/dL — ABNORMAL LOW (ref 6–23)
CALCIUM: 9.1 mg/dL (ref 8.4–10.5)
CO2: 23 meq/L (ref 19–32)
CREATININE: 0.26 mg/dL — AB (ref 0.50–1.10)
Chloride: 98 mEq/L (ref 96–112)
GFR calc Af Amer: >90 mL/min (ref >90)
GLUCOSE: 74 mg/dL (ref 70–99)
Potassium: 3.2 mEq/L — ABNORMAL LOW (ref 3.7–5.3)
SODIUM: 138 meq/L (ref 137–147)

## 2013-11-28 LAB — CBC
HCT: 32.8 % — ABNORMAL LOW (ref 36.0–46.0)
Hemoglobin: 10.3 g/dL — ABNORMAL LOW (ref 12.0–15.0)
MCH: 26.9 pg (ref 26.0–34.0)
MCHC: 31.4 g/dL (ref 30.0–36.0)
MCV: 85.6 fL (ref 78.0–100.0)
PLATELETS: 483 10*3/uL — AB (ref 150–400)
RBC: 3.83 MIL/uL — AB (ref 3.87–5.11)
RDW: 14.9 % (ref 11.5–15.5)
WBC: 12.7 10*3/uL — ABNORMAL HIGH (ref 4.0–10.5)

## 2013-11-28 MED ORDER — CHLORHEXIDINE GLUCONATE CLOTH 2 % EX PADS
6.0000 | MEDICATED_PAD | Freq: Every day | CUTANEOUS | Status: DC
  Administered 2013-11-28 – 2013-11-30 (×3): 6 via TOPICAL

## 2013-11-28 MED ORDER — MUPIROCIN 2 % EX OINT
1.0000 "application " | TOPICAL_OINTMENT | Freq: Two times a day (BID) | CUTANEOUS | Status: DC
  Administered 2013-11-28 – 2013-11-30 (×5): 1 via NASAL
  Filled 2013-11-28 (×2): qty 22

## 2013-11-28 MED ORDER — CETYLPYRIDINIUM CHLORIDE 0.05 % MT LIQD
7.0000 mL | Freq: Two times a day (BID) | OROMUCOSAL | Status: DC
  Administered 2013-11-28 – 2013-11-30 (×5): 7 mL via OROMUCOSAL

## 2013-11-28 NOTE — Clinical Social Work Psychosocial (Signed)
     Clinical Social Work Department BRIEF PSYCHOSOCIAL ASSESSMENT 11/28/2013  Patient:  HILTON, ALGIERE     Account Number:  [id number]     Admit date:  11/26/2013  Clinical Social Worker:  Corlis Hove, CLINICAL SOCIAL WORKER  Date/Time:  11/28/2013 12:35 PM  Referred by:  Care Management  Date Referred:  11/28/2013 Referred for  SNF Placement   Other Referral:   Interview type:  Family Other interview type:    PSYCHOSOCIAL DATA Living Status:  FACILITY Admitted from facility:  [facility] Level of care:  Skilled Nursing Facility Primary support name:  Ambulance person Primary support relationship to patient:  CHILD, ADULT Degree of support available:    CURRENT CONCERNS Current Concerns  Post-Acute Placement   Other Concerns:    SOCIAL WORK ASSESSMENT / PLAN CSW intern attempted to speak with patient about discharge back to [facility]. Patient gave a blank stare and did not answer questions. CSW Intern spoke with son, Lakiska Feimster, via telephone, who stated patient will return to [facility] upon discharge. CSW to complete FL-2 and communicate with facility regarding patient return. CSW remains available for support and to facilitate patient discharge need once medically ready.   Assessment/plan status:  Psychosocial Support/Ongoing Assessment of Needs Other assessment/ plan:   Information/referral to community resources:    PATIENTS/FAMILYS RESPONSE TO PLAN OF CARE: Son seemed engaged in CSW Intern assessment and agreeable with discharge plan. Son Adult nurse of Social Work role.

## 2013-11-28 NOTE — Progress Notes (Signed)
2 Days Post-Op  Subjective: No acute changes Denies nausea  Objective: Vital signs in last 24 hours: Temp:  [98.3 F (36.8 C)-99.2 F (37.3 C)] 98.6 F (37 C) (11/07 0619) Pulse Rate:  [104-111] 110 (11/07 0619) Resp:  [15-16] 16 (11/07 0619) BP: (154-166)/(92-106) 166/100 mmHg (11/07 0619) SpO2:  [100 %] 100 % (11/07 0619)    Intake/Output from previous day: 11/06 0701 - 11/07 0700 In: 1247.5 [P.O.:340; I.V.:907.5] Out: 2625 [Urine:2575; Stool:50] Intake/Output this shift: Total I/O In: 700 [P.O.:100; I.V.:600] Out: 1525 [Urine:1475; Stool:50]  Abdomen soft, ostomy pink and productive  Lab Results:   Recent Labs  11/27/13 0500 11/28/13 0420  WBC 16.3* 12.7*  HGB 9.9* 10.3*  HCT 32.0* 32.8*  PLT 448* 483*   BMET  Recent Labs  11/27/13 0500  NA 141  K 3.9  CL 102  CO2 23  GLUCOSE 125*  BUN 12  CREATININE 0.34*  CALCIUM 9.6   PT/INR No results for input(s): LABPROT, INR in the last 72 hours. ABG No results for input(s): PHART, HCO3 in the last 72 hours.  Invalid input(s): PCO2, PO2  Studies/Results: No results found.  Anti-infectives: Anti-infectives    Start     Dose/Rate Route Frequency Ordered Stop   11/26/13 0730  ceFAZolin (ANCEF) IVPB 2 g/50 mL premix  Status:  Discontinued     2 g100 mL/hr over 30 Minutes Intravenous  Once 11/26/13 0715 11/26/13 1231   11/26/13 0600  ceFAZolin (ANCEF) IVPB 2 g/50 mL premix     2 g100 mL/hr over 30 Minutes Intravenous On call to O.R. 11/25/13 1355 11/26/13 0851      Assessment/Plan: s/p Procedure(s): LAPAROSCOPIC ASSISTED DESCENDING END COLOSTOMY (N/A)  D/c foley Start tube feeds  LOS: 2 days    Cuinn Westerhold A 11/28/2013

## 2013-11-29 MED ORDER — OSMOLITE 1.2 CAL PO LIQD
1000.0000 mL | ORAL | Status: DC
  Administered 2013-11-29 – 2013-11-30 (×2): 1000 mL
  Filled 2013-11-29: qty 1000

## 2013-11-29 MED ORDER — OSMOLITE 1.2 CAL PO LIQD
1000.0000 mL | ORAL | Status: DC
  Filled 2013-11-29: qty 1000

## 2013-11-29 NOTE — Progress Notes (Signed)
CSW Intern Tonette BihariJeneya McLean's note cosigned by Chelsea AusJ Danille Oppedisano LCSW

## 2013-11-29 NOTE — Progress Notes (Signed)
3 Days Post-Op  Subjective: No complaints  Objective: Vital signs in last 24 hours: Temp:  [98.1 F (36.7 C)-99.1 F (37.3 C)] 98.1 F (36.7 C) (11/08 0650) Pulse Rate:  [109-120] 110 (11/08 0650) Resp:  [15-19] 15 (11/08 0650) BP: (142-168)/(86-105) 142/86 mmHg (11/08 0650) SpO2:  [100 %] 100 % (11/08 0650) Last BM Date: 11/28/13  Intake/Output from previous day: 11/07 0701 - 11/08 0700 In: -  Out: 85 [Stool:85] Intake/Output this shift:    Resp: clear to auscultation bilaterally Cardio: regular rate and rhythm GI: soft, nontender. ostomy pink and productive  Lab Results:   Recent Labs  11/27/13 0500 11/28/13 0420  WBC 16.3* 12.7*  HGB 9.9* 10.3*  HCT 32.0* 32.8*  PLT 448* 483*   BMET  Recent Labs  11/27/13 0500 11/28/13 0420  NA 141 138  K 3.9 3.2*  CL 102 98  CO2 23 23  GLUCOSE 125* 74  BUN 12 5*  CREATININE 0.34* 0.26*  CALCIUM 9.6 9.1   PT/INR No results for input(s): LABPROT, INR in the last 72 hours. ABG No results for input(s): PHART, HCO3 in the last 72 hours.  Invalid input(s): PCO2, PO2  Studies/Results: No results found.  Anti-infectives: Anti-infectives    Start     Dose/Rate Route Frequency Ordered Stop   11/26/13 0730  ceFAZolin (ANCEF) IVPB 2 g/50 mL premix  Status:  Discontinued     2 g100 mL/hr over 30 Minutes Intravenous  Once 11/26/13 0715 11/26/13 1231   11/26/13 0600  ceFAZolin (ANCEF) IVPB 2 g/50 mL premix     2 g100 mL/hr over 30 Minutes Intravenous On call to O.R. 11/25/13 1355 11/26/13 0851      Assessment/Plan: s/p Procedure(s): LAPAROSCOPIC ASSISTED DESCENDING END COLOSTOMY (N/A) Advance diet  PT Continue tube feeds for nutritional support  LOS: 3 days    TOTH III,[name] [name] S 11/29/2013

## 2013-11-30 ENCOUNTER — Ambulatory Visit: Payer: Self-pay | Admitting: Neurology

## 2013-11-30 NOTE — Progress Notes (Signed)
Patient ID: [name] [name], female   DOB: 09-Oct-1970, 43 y.o.   MRN: [id number]  No new changes Ostomy working  Discharge to facility

## 2013-11-30 NOTE — Clinical Social Work Note (Signed)
Patient will discharge to [facility] Anticipated discharge date:11/30/13 Family notified: Son, Mr. Marsicano Transportation by [employer]- scheduled for 12:30  CSW signing off.  Merlyn Lot, LCSWA Clinical Social Worker [phone]

## 2013-11-30 NOTE — Discharge Summary (Signed)
Physician Discharge Summary  Patient ID: [name] [name] MRN: [id number] DOB/AGE: 44-Feb-1972 44 y.o.  Admit date: 11/26/2013 Discharge date: 11/30/2013  Admission Diagnoses:  Discharge Diagnoses:  Active Problems:   S/P colostomy MS Sacral Decub  Discharged Condition: fair  Hospital Course: uneventful post op recovery s/p colostomy.  Tolerating tube feeds  Consults: None  Significant Diagnostic Studies:   Treatments: surgery: laparoscopic assisted loop colostomy  Discharge Exam: Blood pressure 133/93, pulse 96, temperature 98.2 F (36.8 C), temperature source Oral, resp. rate 16, height 5\' 1"  (1.549 m), weight 113 lb (51.256 kg), last menstrual period 08/23/2012, SpO2 100 %. General appearance: cooperative, no distress and slowed mentation Resp: clear to auscultation bilaterally Cardio: regular rate and rhythm, S1, S2 normal, no murmur, click, rub or gallop Incision/Wound:ostomy pink and well perfused, working well  Disposition: 01-Home or Self Care     Medication List    TAKE these medications        amLODipine 5 MG tablet  Commonly known as:  NORVASC  Take 1 tablet (5 mg total) by mouth daily.     ascorbic acid 500 MG tablet  Commonly known as:  VITAMIN C  Take 500 mg by mouth 2 (two) times daily.     aspirin EC 81 MG tablet  Take 1 tablet (81 mg total) by mouth daily.     baclofen 10 MG tablet  Commonly known as:  LIORESAL  Take 5 mg by mouth 3 (three) times daily.     citalopram 10 MG tablet  Commonly known as:  CELEXA  Take 1 tablet (10 mg total) by mouth daily.     feeding supplement (OSMOLITE 1.2 CAL) Liqd  Place 1,000 mLs into feeding tube continuous.     ENSURE  Take 237 mLs by mouth 2 (two) times daily between meals.     feeding supplement (PRO-STAT SUGAR FREE 64) Liqd  Place 30 mLs into feeding tube daily.     fentaNYL 25 MCG/HR patch  Commonly known as:  DURAGESIC - dosed mcg/hr  Place 25 mcg onto the skin every 3 (three) days.     HYDROcodone-acetaminophen 5-325 MG per tablet  Commonly known as:  NORCO/VICODIN  Take 1 tablet by mouth every 6 (six) hours as needed for moderate pain or severe pain.     meclizine 25 MG tablet  Commonly known as:  ANTIVERT  Take 25 mg by mouth every 8 (eight) hours as needed for dizziness or nausea.     metFORMIN 500 MG tablet  Commonly known as:  GLUCOPHAGE  Take 1 tablet (500 mg total) by mouth daily with breakfast.     metoprolol tartrate 25 MG tablet  Commonly known as:  LOPRESSOR  Take 1 tablet (25 mg total) by mouth 2 (two) times daily.     potassium chloride 10 MEQ CR capsule  Commonly known as:  MICRO-K  Take 10 mEq by mouth daily.     saccharomyces boulardii 250 MG capsule  Commonly known as:  FLORASTOR  Take 1 capsule (250 mg total) by mouth 2 (two) times daily.     zinc sulfate 220 MG capsule  Take 220 mg by mouth daily.         SignedAbigail Miyamoto A 11/30/2013, 6:30 AM

## 2013-11-30 NOTE — Progress Notes (Signed)
Report was given to Koleen Nimrod at [facility].

## 2013-12-01 ENCOUNTER — Telehealth: Payer: Self-pay | Admitting: Neurology

## 2013-12-01 NOTE — Telephone Encounter (Signed)
Pt no showed 11/30/13 appt w/ Dr. Everlena Cooper. No show letter mailed to pt / Sherri S.

## 2013-12-04 ENCOUNTER — Telehealth: Payer: Self-pay | Admitting: Neurology

## 2013-12-04 ENCOUNTER — Ambulatory Visit: Payer: Self-pay | Admitting: Neurology

## 2013-12-04 NOTE — Telephone Encounter (Signed)
Pt's son came to the office at 7:40AM for his mother's appt. When he called her assisting living transportation he found out that No transportation was scheduled for her. Pt's son r/s her f/u appt to 12/07/13 at 9:15AM.

## 2013-12-07 ENCOUNTER — Encounter: Payer: Self-pay | Admitting: Neurology

## 2013-12-07 ENCOUNTER — Ambulatory Visit (INDEPENDENT_AMBULATORY_CARE_PROVIDER_SITE_OTHER): Payer: Medicare Other | Admitting: Neurology

## 2013-12-07 VITALS — BP 132/84 | HR 130

## 2013-12-07 DIAGNOSIS — G35 Multiple sclerosis: Secondary | ICD-10-CM

## 2013-12-07 NOTE — Telephone Encounter (Signed)
12/04/13 appt was marked as a no show but letter not sent. / Sherri S.

## 2013-12-07 NOTE — Patient Instructions (Addendum)
Due to the severity of Ms. Tickner's MS, I feel the most appropriate treatment would be Tysabri.  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  Her chance of developing this in the first two years of treatment would be less than 1 out of a 1000.  However, the potential benefits of the medication i feel would outweigh the risk.  We will try to set this up with follow up after first treatment.  Please also start Vitamin D3 4000 units daily.

## 2013-12-07 NOTE — Progress Notes (Addendum)
NEUROLOGY FOLLOW UP OFFICE NOTE  [name] [name] [id number]  HISTORY OF PRESENT ILLNESS: [name] [name] is a 43 year old right-handed African American woman with history of hypertension, diabetes, and tobacco abuse who follows up to discuss treatment options regarding secondary progressive MS.  Labs reviewed.  She is accompanied by her son.  UPDATE: She is currently in a rehab facility.  She has a PEG and colostomy.  She is also suffering from decubitus ulcers.  She has extreme pain involving her legs.  08/13/13 LABS:  vit D 25-hydroxy 10, JCV ab positive 11/11/13 LABS:  WBC 15.2, HGB 10.5, HCT 32.5, PLT 542, 82% neutrophils, 10% lymphocytes, 7% monocytes, 1% eosinophils, 0% basophils. 11/28/13 LABS:  WBC 12.7, HGB 10.3, HCT 32.8, PLT 483  HISTORY: She was diagnosed with MS in 2007.  Reportedly, she had right optic neuritis at that time.  She had an MRI of the brain from 02/04/05, which reportedly revealed "multifocal white matter lesions with enhancement in the right posterior frontal lobe, suggestive of optic neuritis and a demyelinating process at the origin".  She never followed up with a neurologist until 2009, when she [facility].  Since she had only one clinical attack, treatment with a disease-modifying agent was withheld at that point.  She subsequently developed several attacks and worsening symptoms over the years.  Symptomatology includes frequent falls, vertigo, right leg weakness, paresthesias, cognitive deficits and mood disorder.  Beginning in 2012, she required use of a cane.  She didn't follow up again with Dr. Vickey Hugerohmeier until 2013.  She has not had any follow ups with a neurologist since then.  Over the years, she has had multiple hospital admissions for symptomatic MS, requiring steroid therapy.  It particularly started getting severe in 2010, following the death of her brothers.  She became more depressed and wasn't really taking care of herself medically.  Her  most recent hospitalization for MS exacerbation was 07/03/13, presenting as nausea, vomiting and dizziness. Prior to this hospitalization, she was able to ambulate short distances with a cane.  She has pretty much been wheelchair-bound.  She requires assistance with all ADLs.  She has bowel and bladder incontinence, some of it may be behavioral due to depression.  She suffers from significant depression and was only just started on an antidepressant, Wellbutrin, at her last hospitalization.  She was also started on meclizine for vertigo.  Since her last hospitalization, she has visual hallucinations, seeing people in the house that aren't there.  This occurs when she is half asleep at night.  Her son thinks it is related to the Wellbutrin.  Sometimes she is combative and agitated.  She has word-finding difficulties sometimes.  She is somewhat fatigued, but her son thinks it is more likely related to depression.  She lives with her son, who tends to her care.  She does have a physical therapist who comes to the home twice a week.  Home health is currently scheduled to make a visit.  She has never been started on a disease-modifying agent.  Last round of steroids was June 2015.  Current medications include Celexa, Norco, fentanyl, metoprolol, metformin, Zofran  07/03/13 EKG:  sinus rhythm of 96 bpm with QTc 493. 07/03/13 MRI BRAIN W/WO (compared to scan from 06/04/13):  multiple enhancing white matter lesions within the supratentorial and infratentorial white matter, punctate foci of enhancement within the upper cervical cord at C2 and C3 levels, consistent with active demyelination. 06/04/13 MRI BRAIN W/WO (  compared to scan from 09/06/12):  no new areas of demyelination present. 09/06/12 MRI BRAIN W/WO (compared to 05/28/12):  multiple newly seen active foci of demyelination throughout cerebral white matter and cerebellum. 05/28/12 MRI BRAIN WO (compared to 03/18/10):  interval progression of confluent supratentorial and  infratentorial white matter lesions 05/28/12 MRI CERVICAL SPINE WO (compared to 03/18/10):  minimal change of signal within cord from C1 through C5 level. 03/18/10 MRI BRAIN W/WO (compared to 06/03/09):  slightly more extensive white matter hyperintensities   03/18/10 MRI CERVICAL SPINE W/WO (compared to 06/03/09):  signal abnormality in cord from C3-5, stable. 06/03/09 MRI BRAIN W/WO:  widespread white matter disease involving the cerebral hemispheres and cefrebellum.  07/03/13 LABS:  TSH 0.083 07/10/13 LABS:  WBC 10.6, HGB 14.3, HCT 40.4, MCV 90.8, PLT 269, Na 142, [name] 3.7, BUN 15, Cr 0.60, TP 6.4, ALP 78, TB 1.2, AST 14, ALT 23 03/16/10 LABS:  Sed Rate 25, CRP 0.5, HIV negative, TSH 0.202, B12 416, ACE 16, RPR non-reactive, ANA negative, B burgdorferi antibodies negative, SSA/SSB antibodies negative  PAST MEDICAL HISTORY: Past Medical History  Diagnosis Date  . Multiple sclerosis   . HYPERTENSION 09/30/2006  . TIA (transient ischemic attack)   . TOBACCO USER 09/30/2006    Qualifier: Diagnosis of  By: Delrae Alfred MD, Lanora Manis    . Multiple sclerosis, relapsing-remitting 05/29/2012  . DYSLIPIDEMIA 09/30/2006    Qualifier: Diagnosis of  By: Delrae Alfred MD, Lanora Manis    . BENIGN POSITIONAL VERTIGO 01/27/2010    Qualifier: Diagnosis of  By: Delrae Alfred MD, Lanora Manis    . Depression   . DIABETES MELLITUS, TYPE II 09/30/2006    Qualifier: Diagnosis of  By: Delrae Alfred MD, Lanora Manis    . Neuromuscular disorder     MS  . Stroke     pt denies this hx on 09/02/2013  . Sacral wound     MEDICATIONS: Current Outpatient Prescriptions on File Prior to Visit  Medication Sig Dispense Refill  . amLODipine (NORVASC) 5 MG tablet Take 1 tablet (5 mg total) by mouth daily. 30 tablet 1  . ascorbic acid (VITAMIN C) 500 MG tablet Take 500 mg by mouth 2 (two) times daily.    Marland Kitchen aspirin EC 81 MG tablet Take 1 tablet (81 mg total) by mouth daily.    . baclofen (LIORESAL) 10 MG tablet Take 5 mg by mouth 3 (three) times daily.    .  citalopram (CELEXA) 10 MG tablet Take 1 tablet (10 mg total) by mouth daily. 30 tablet 2  . fentaNYL (DURAGESIC - DOSED MCG/HR) 25 MCG/HR patch Place 25 mcg onto the skin every 3 (three) days.    Marland Kitchen HYDROcodone-acetaminophen (NORCO/VICODIN) 5-325 MG per tablet Take 1 tablet by mouth every 6 (six) hours as needed for moderate pain or severe pain.     . meclizine (ANTIVERT) 25 MG tablet Take 25 mg by mouth every 8 (eight) hours as needed for dizziness or nausea.    . metFORMIN (GLUCOPHAGE) 500 MG tablet Take 1 tablet (500 mg total) by mouth daily with breakfast. 60 tablet 0  . metoprolol tartrate (LOPRESSOR) 25 MG tablet Take 1 tablet (25 mg total) by mouth 2 (two) times daily. 60 tablet 1  . Nutritional Supplements (FEEDING SUPPLEMENT, OSMOLITE 1.2 CAL,) LIQD Place 1,000 mLs into feeding tube continuous.    . potassium chloride (MICRO-[name]) 10 MEQ CR capsule Take 10 mEq by mouth daily.    Marland Kitchen saccharomyces boulardii (FLORASTOR) 250 MG capsule Take 1 capsule (250 mg total)  by mouth 2 (two) times daily.    Marland Kitchen zinc sulfate 220 MG capsule Take 220 mg by mouth daily.     No current facility-administered medications on file prior to visit.    ALLERGIES: No Known Allergies  FAMILY HISTORY: Family History  Problem Relation Age of Onset  . Hypertension Mother   . Kidney failure Mother   . Hypertension Father   . Gout Father   . Hypertension Sister   . Diabetes Sister   . Hypertension Brother     SOCIAL HISTORY: History   Social History  . Marital Status: Single    Spouse Name: N/A    Number of Children: N/A  . Years of Education: N/A   Occupational History  . Not on file.   Social History Main Topics  . Smoking status: Former Smoker -- 0.50 packs/day for 20 years    Types: Cigarettes    Quit date: 07/07/2011  . Smokeless tobacco: Never Used  . Alcohol Use: No  . Drug Use: No  . Sexual Activity: Not Currently   Other Topics Concern  . Not on file   Social History Narrative     REVIEW OF SYSTEMS: Constitutional: Decreased appetite, fatigue Eyes: No visual changes, double vision, eye pain Ear, nose and throat: No hearing loss, ear pain, nasal congestion, sore throat Cardiovascular: No chest pain, palpitations Respiratory:  No shortness of breath at rest or with exertion, wheezes GastrointestinaI: PEG and colostomy Genitourinary:  No dysuria, urinary retention or frequency Musculoskeletal:  Generalized pain Integumentary: decubitus ulcer Neurological: as above Psychiatric: depression Endocrine: decreased appetite. Hematologic/Lymphatic:  No anemia, purpura, petechiae. Allergic/Immunologic: no itchy/runny eyes, nasal congestion, recent allergic reactions, rashes  PHYSICAL EXAM: Filed Vitals:   12/07/13 0919  BP: 132/84  Pulse: 130   General: Tearful, some distress Head:  Normocephalic/atraumatic Eyes:  Fundi cannot be visualized. Neck: supple, no paraspinal tenderness, full range of motion Heart:  Regular rate and rhythm Lungs:  Clear to auscultation bilaterally Back: No paraspinal tenderness Neurological Exam: depressed affect, alert and oriented to person, place, (not time), unable to assess fund of knowledge and memory due to pain, reduced attention and concentration, reduced verbal output but speech fluent and not dysarthric, language intact.  Does not track well but EOMs appear to have full range of motion.  Otherwise, CN II-XII intact.  Fundi not able to be visualized.  Increased tone in all extremities.  4+/5 in upper extremities, unable to move lower extremities.  Unable to ambulate.  She is on a stretcher.  IMPRESSION: Secondary progressive MS, never before on a disease-modifying agent.  She appears physically worse than last visit.  PLAN: 1.  Due to the severity of her disease, I feel that the only viable option for treatment would be Tysabri.  She is JC virus positive.  However, the risk of PML is lower than the continued deterioration of  her disease.  I discussed the benefits and side effects in detail with the patient and her children, including risk of PML.  They appeared to comprehend the conversation and I answered all questions to the best of my ability.  It has been agreed by all to start Tysabri.  Provided information on Tysabri. 2.  Recommend starting D3 4000 units daily 3.  She has poor veins, so we would have to have a port a cath placed. 4.  Follow up following first infusion.  Shon Millet, DO  CC: Jeanann Lewandowsky, MD

## 2013-12-08 ENCOUNTER — Telehealth: Payer: Self-pay | Admitting: *Deleted

## 2013-12-08 ENCOUNTER — Other Ambulatory Visit: Payer: Self-pay | Admitting: Neurology

## 2013-12-08 DIAGNOSIS — G35 Multiple sclerosis: Secondary | ICD-10-CM

## 2013-12-08 NOTE — Telephone Encounter (Signed)
12/14/13 [facility] short stay 8:15am  NPO  after midnight hold diabetic meds until after procedure  all other medication can be taken with a small about of water  Facility is aware I spoke with Kennyth Arnold

## 2013-12-10 ENCOUNTER — Other Ambulatory Visit: Payer: Self-pay | Admitting: Radiology

## 2013-12-11 ENCOUNTER — Other Ambulatory Visit: Payer: Self-pay | Admitting: Radiology

## 2013-12-14 ENCOUNTER — Ambulatory Visit (HOSPITAL_COMMUNITY)
Admission: RE | Admit: 2013-12-14 | Discharge: 2013-12-14 | Disposition: A | Discharge status: Home / Self Care | Payer: Medicare Other | Source: Ambulatory Visit | Attending: Neurology | Admitting: Neurology

## 2013-12-14 ENCOUNTER — Encounter (HOSPITAL_COMMUNITY): Payer: Self-pay

## 2013-12-14 DIAGNOSIS — G35 Multiple sclerosis: Secondary | ICD-10-CM

## 2013-12-14 DIAGNOSIS — E785 Hyperlipidemia, unspecified: Secondary | ICD-10-CM | POA: Insufficient documentation

## 2013-12-14 DIAGNOSIS — F329 Major depressive disorder, single episode, unspecified: Secondary | ICD-10-CM | POA: Insufficient documentation

## 2013-12-14 DIAGNOSIS — Z5309 Procedure and treatment not carried out because of other contraindication: Secondary | ICD-10-CM | POA: Diagnosis not present

## 2013-12-14 DIAGNOSIS — E119 Type 2 diabetes mellitus without complications: Secondary | ICD-10-CM | POA: Diagnosis not present

## 2013-12-14 DIAGNOSIS — I10 Essential (primary) hypertension: Secondary | ICD-10-CM | POA: Insufficient documentation

## 2013-12-14 DIAGNOSIS — Z931 Gastrostomy status: Secondary | ICD-10-CM | POA: Insufficient documentation

## 2013-12-14 DIAGNOSIS — A4902 Methicillin resistant Staphylococcus aureus infection, unspecified site: Secondary | ICD-10-CM | POA: Insufficient documentation

## 2013-12-14 DIAGNOSIS — Z7982 Long term (current) use of aspirin: Secondary | ICD-10-CM | POA: Insufficient documentation

## 2013-12-14 DIAGNOSIS — G9340 Encephalopathy, unspecified: Secondary | ICD-10-CM | POA: Diagnosis not present

## 2013-12-14 DIAGNOSIS — Z87891 Personal history of nicotine dependence: Secondary | ICD-10-CM | POA: Insufficient documentation

## 2013-12-14 DIAGNOSIS — Z8673 Personal history of transient ischemic attack (TIA), and cerebral infarction without residual deficits: Secondary | ICD-10-CM | POA: Diagnosis not present

## 2013-12-14 DIAGNOSIS — Z933 Colostomy status: Secondary | ICD-10-CM | POA: Diagnosis not present

## 2013-12-14 DIAGNOSIS — Z791 Long term (current) use of non-steroidal anti-inflammatories (NSAID): Secondary | ICD-10-CM | POA: Diagnosis not present

## 2013-12-14 DIAGNOSIS — Z7401 Bed confinement status: Secondary | ICD-10-CM | POA: Insufficient documentation

## 2013-12-14 LAB — CBC WITH DIFFERENTIAL/PLATELET
BASOS ABS: 0 10*3/uL (ref 0.0–0.1)
BASOS PCT: 0 % (ref 0–1)
EOS ABS: 0.1 10*3/uL (ref 0.0–0.7)
EOS PCT: 1 % (ref 0–5)
HCT: 36 % (ref 36.0–46.0)
Hemoglobin: 11.6 g/dL — ABNORMAL LOW (ref 12.0–15.0)
Lymphocytes Relative: 13 % (ref 12–46)
Lymphs Abs: 2.2 10*3/uL (ref 0.7–4.0)
MCH: 26.9 pg (ref 26.0–34.0)
MCHC: 32.2 g/dL (ref 30.0–36.0)
MCV: 83.3 fL (ref 78.0–100.0)
Monocytes Absolute: 1.2 10*3/uL — ABNORMAL HIGH (ref 0.1–1.0)
Monocytes Relative: 7 % (ref 3–12)
Neutro Abs: 13.7 10*3/uL — ABNORMAL HIGH (ref 1.7–7.7)
Neutrophils Relative %: 79 % — ABNORMAL HIGH (ref 43–77)
PLATELETS: 624 10*3/uL — AB (ref 150–400)
RBC: 4.32 MIL/uL (ref 3.87–5.11)
RDW: 15.5 % (ref 11.5–15.5)
WBC: 17.3 10*3/uL — AB (ref 4.0–10.5)

## 2013-12-14 LAB — PROTIME-INR
INR: 1.35 (ref 0.00–1.49)
PROTHROMBIN TIME: 16.8 s — AB (ref 11.6–15.2)

## 2013-12-14 LAB — GLUCOSE, CAPILLARY: Glucose-Capillary: 142 mg/dL — ABNORMAL HIGH (ref 70–99)

## 2013-12-14 MED ORDER — SODIUM CHLORIDE 0.9 % IV SOLN: INTRAVENOUS | Status: DC

## 2013-12-14 MED ORDER — FENTANYL CITRATE 0.05 MG/ML IJ SOLN
INTRAMUSCULAR | Status: AC
  Filled 2013-12-14: qty 4

## 2013-12-14 MED ORDER — CEFAZOLIN SODIUM-DEXTROSE 2-3 GM-% IV SOLR: 2.0000 g | Freq: Once | INTRAVENOUS | Status: DC

## 2013-12-14 MED ORDER — MIDAZOLAM HCL 2 MG/2ML IJ SOLN
INTRAMUSCULAR | Status: AC
  Filled 2013-12-14: qty 4

## 2013-12-14 NOTE — H&P (Signed)
Chief Complaint: Multiple sclerosis Need for long term medical management  Referring Physician(s): Jaffe,Adam Robert  History of Present Illness: [name] [name] is a 43 y.o. female  Pt with MS Need for long term medical management Tysabri infusion Lives in SNF Encephalopathic; DM; HTN Difficult to communicate Nursing facility dependent Request from Dr Everlena Cooper for [facility] a catheter placement   Past Medical History  Diagnosis Date  . Multiple sclerosis   . HYPERTENSION 09/30/2006  . TIA (transient ischemic attack)   . TOBACCO USER 09/30/2006    Qualifier: Diagnosis of  By: Delrae Alfred MD, Lanora Manis    . Multiple sclerosis, relapsing-remitting 05/29/2012  . DYSLIPIDEMIA 09/30/2006    Qualifier: Diagnosis of  By: Delrae Alfred MD, Lanora Manis    . BENIGN POSITIONAL VERTIGO 01/27/2010    Qualifier: Diagnosis of  By: Delrae Alfred MD, Lanora Manis    . Depression   . DIABETES MELLITUS, TYPE II 09/30/2006    Qualifier: Diagnosis of  By: Delrae Alfred MD, Lanora Manis    . Neuromuscular disorder     MS  . Stroke     pt denies this hx on 09/02/2013  . Sacral wound     Past Surgical History  Procedure Laterality Date  . Peg tube placement  2015  . Laparoscopic diverted colostomy N/A 11/26/2013    Procedure: LAPAROSCOPIC ASSISTED DESCENDING END COLOSTOMY;  Surgeon: Abigail Miyamoto, MD;  Location: [facility] OR;  Service: General;  Laterality: N/A;    Allergies: Review of patient's allergies indicates no known allergies.  Medications: Prior to Admission medications   Medication Sig Start Date End Date Taking? Authorizing Provider  Amino Acids-Protein Hydrolys (FEEDING SUPPLEMENT, PRO-STAT SUGAR FREE 64,) LIQD Take 30 mLs by mouth daily before supper.   Yes Historical Provider, MD  amLODipine (NORVASC) 5 MG tablet Take 1 tablet (5 mg total) by mouth daily. 07/24/13  Yes Daniel J Angiulli, PA-C  ascorbic acid (VITAMIN C) 500 MG tablet Take 500 mg by mouth 2 (two) times daily.   Yes Historical Provider, MD  aspirin EC  81 MG tablet Take 1 tablet (81 mg total) by mouth daily. 07/24/13  Yes Daniel J Angiulli, PA-C  baclofen (LIORESAL) 10 MG tablet Take 5 mg by mouth 3 (three) times daily.   Yes Historical Provider, MD  citalopram (CELEXA) 10 MG tablet Take 1 tablet (10 mg total) by mouth daily. 08/13/13  Yes Adam Gus Rankin, DO  fentaNYL (DURAGESIC - DOSED MCG/HR) 25 MCG/HR patch Place 25 mcg onto the skin every 3 (three) days.   Yes Historical Provider, MD  HYDROcodone-acetaminophen (NORCO/VICODIN) 5-325 MG per tablet Take 1 tablet by mouth every 6 (six) hours as needed for moderate pain or severe pain.    Yes Historical Provider, MD  meclizine (ANTIVERT) 25 MG tablet Take 25 mg by mouth every 8 (eight) hours as needed for dizziness or nausea.   Yes Historical Provider, MD  metFORMIN (GLUCOPHAGE) 500 MG tablet Take 1 tablet (500 mg total) by mouth daily with breakfast. 07/24/13  Yes Mcarthur Rossetti Angiulli, PA-C  metoprolol tartrate (LOPRESSOR) 25 MG tablet Take 1 tablet (25 mg total) by mouth 2 (two) times daily. 07/24/13  Yes Daniel J Angiulli, PA-C  Nutritional Supplements (FEEDING SUPPLEMENT, OSMOLITE 1.2 CAL,) LIQD Place 1,000 mLs into feeding tube continuous.   Yes Historical Provider, MD  potassium chloride (MICRO-K) 10 MEQ CR capsule Take 10 mEq by mouth daily.   Yes Historical Provider, MD  saccharomyces boulardii (FLORASTOR) 250 MG capsule Take 1 capsule (250 mg total) by mouth  2 (two) times daily. 09/04/13  Yes Zannie CovePreetha Joseph, MD  TUBERCULIN PPD ID Inject 0.1 mLs into the skin. Every 10 days   Yes Historical Provider, MD  zinc sulfate 220 MG capsule Take 220 mg by mouth daily.   Yes Historical Provider, MD    Family History  Problem Relation Age of Onset  . Hypertension Mother   . Kidney failure Mother   . Hypertension Father   . Gout Father   . Hypertension Sister   . Diabetes Sister   . Hypertension Brother     History   Social History  . Marital Status: Single    Spouse Name: N/A    Number of  Children: N/A  . Years of Education: N/A   Social History Main Topics  . Smoking status: Former Smoker -- 0.50 packs/day for 20 years    Types: Cigarettes    Quit date: 07/07/2011  . Smokeless tobacco: Never Used  . Alcohol Use: No  . Drug Use: No  . Sexual Activity: Not Currently   Other Topics Concern  . None   Social History Narrative     Review of Systems: A 12 point ROS discussed and pertinent positives are indicated in the HPI above.  All other systems are negative.  Review of Systems  Constitutional: Negative for activity change, appetite change and unexpected weight change.  HENT: Negative for drooling.   Respiratory: Negative for cough.   Neurological: Positive for speech difficulty and weakness.  Psychiatric/Behavioral: Positive for behavioral problems and agitation.       Pt easy to cry Difficult to communicate at all    Vital Signs: BP 113/82 mmHg  Pulse 119  Temp(Src) 97.5 F (36.4 C) (Oral)  Resp 16  Ht 5\' 1"  (1.549 m)  Wt 51.256 kg (113 lb)  BMI 21.36 kg/m2  SpO2 100%  LMP 08/23/2012  Physical Exam  Constitutional: She appears well-nourished.  Cardiovascular: Normal rate and regular rhythm.   No murmur heard. Pulmonary/Chest: Effort normal. She has wheezes.  Abdominal: Soft. Bowel sounds are normal. There is no tenderness.  Musculoskeletal:  Uses Rt side and follows command Lt side without movement  Neurological:  encephalopathy  Skin: Skin is warm and dry.  Psychiatric:  Consented son over phone    Imaging: No results found.  Labs:  CBC:  Recent Labs  11/11/13 1252 11/19/13 1048 11/27/13 0500 11/28/13 0420  WBC 15.2* 11.0* 16.3* 12.7*  HGB 10.5* 10.5* 9.9* 10.3*  HCT 32.5* 33.1* 32.0* 32.8*  PLT 542* 558* 448* 483*    COAGS:  Recent Labs  06/02/13 2309 09/30/13 1148  INR 1.02 1.15  APTT  --  29    BMP:  Recent Labs  11/11/13 1252 11/19/13 1048 11/27/13 0500 11/28/13 0420  NA 136* 139 141 138  K 4.1 3.6*  3.9 3.2*  CL 97 101 102 98  CO2 25 26 23 23   GLUCOSE 149* 137* 125* 74  BUN 12 11 12  5*  CALCIUM 9.4 9.5 9.6 9.1  CREATININE 0.23* 0.25* 0.34* 0.26*  GFRNONAA >90 >90 >90 >90  GFRAA >90 >90 >90 >90    LIVER FUNCTION TESTS:  Recent Labs  09/04/13 0524 09/05/13 0535 09/06/13 0630 09/07/13 0527  BILITOT 1.0 0.7 0.7 0.6  AST 16 18 33 30  ALT 12 12 19 21   ALKPHOS 113 110 119* 134*  PROT 6.6 6.4 6.7 7.0  ALBUMIN 2.8* 2.8* 2.9* 3.0*    TUMOR MARKERS: No results for input(s): AFPTM, CEA, CA199,  CHROMGRNA in the last 8760 hours.  Assessment and Plan:  Pt with Multiple sclerosis Nursing facility dependent Difficult to communicate Encephalopathic Need for long term medical management of MS- Tysabri infusion Now scheduled for Port a cath placement pts son [facility] POA) aware of procedure benefits and risks and agreeable to proceed Consent signed and in chart  Thank you for this interesting consult.  I greatly enjoyed meeting [name] [name] and look forward to participating in their care.    I spent a total of 20 minutes face to face in clinical consultation, greater than 50% of which was counseling/coordinating care for [name] [name] a catheter placement  Signed: Redell Nazir A 12/14/2013, 9:32 AM

## 2013-12-14 NOTE — Progress Notes (Signed)
Patient ID: [name] [name], female   DOB: [birth year], 43 y.o.   MRN: [id number]  She is very high risk for infection with advanced MS, bedridden, gtube, colostomy, and MRSA+.  WBC ct also elevated today.  Access only needed for a 1 hour Tysabri  infusion once a month.  I'd rec IR to do a [place] peripheral IV start for this access on the day of infusion and not put in a port catheter.  D/w Dr Everlena Cooper today

## 2013-12-18 ENCOUNTER — Emergency Department (HOSPITAL_COMMUNITY): Payer: Medicare Other

## 2013-12-18 ENCOUNTER — Inpatient Hospital Stay (HOSPITAL_COMMUNITY): Payer: Medicare Other

## 2013-12-18 ENCOUNTER — Emergency Department (HOSPITAL_COMMUNITY): Payer: Medicare Other | Admitting: Anesthesiology

## 2013-12-18 ENCOUNTER — Inpatient Hospital Stay (HOSPITAL_COMMUNITY)
Admission: EM | Admit: 2013-12-18 | Discharge: 2013-12-22 | DRG: 871 | Disposition: E | Payer: Medicare Other | Attending: Pulmonary Disease | Admitting: Pulmonary Disease

## 2013-12-18 ENCOUNTER — Encounter (HOSPITAL_COMMUNITY): Payer: Self-pay | Admitting: Cardiology

## 2013-12-18 DIAGNOSIS — J690 Pneumonitis due to inhalation of food and vomit: Secondary | ICD-10-CM | POA: Diagnosis present

## 2013-12-18 DIAGNOSIS — Z9289 Personal history of other medical treatment: Secondary | ICD-10-CM

## 2013-12-18 DIAGNOSIS — Z7982 Long term (current) use of aspirin: Secondary | ICD-10-CM

## 2013-12-18 DIAGNOSIS — E119 Type 2 diabetes mellitus without complications: Secondary | ICD-10-CM | POA: Diagnosis present

## 2013-12-18 DIAGNOSIS — I959 Hypotension, unspecified: Secondary | ICD-10-CM | POA: Diagnosis present

## 2013-12-18 DIAGNOSIS — L89154 Pressure ulcer of sacral region, stage 4: Secondary | ICD-10-CM | POA: Diagnosis present

## 2013-12-18 DIAGNOSIS — J9690 Respiratory failure, unspecified, unspecified whether with hypoxia or hypercapnia: Secondary | ICD-10-CM | POA: Diagnosis present

## 2013-12-18 DIAGNOSIS — R0603 Acute respiratory distress: Secondary | ICD-10-CM

## 2013-12-18 DIAGNOSIS — Z931 Gastrostomy status: Secondary | ICD-10-CM

## 2013-12-18 DIAGNOSIS — G0481 Other encephalitis and encephalomyelitis: Secondary | ICD-10-CM | POA: Diagnosis present

## 2013-12-18 DIAGNOSIS — Z8673 Personal history of transient ischemic attack (TIA), and cerebral infarction without residual deficits: Secondary | ICD-10-CM | POA: Diagnosis not present

## 2013-12-18 DIAGNOSIS — Z79899 Other long term (current) drug therapy: Secondary | ICD-10-CM

## 2013-12-18 DIAGNOSIS — I10 Essential (primary) hypertension: Secondary | ICD-10-CM | POA: Diagnosis present

## 2013-12-18 DIAGNOSIS — Z66 Do not resuscitate: Secondary | ICD-10-CM | POA: Diagnosis not present

## 2013-12-18 DIAGNOSIS — Z87891 Personal history of nicotine dependence: Secondary | ICD-10-CM

## 2013-12-18 DIAGNOSIS — R6521 Severe sepsis with septic shock: Secondary | ICD-10-CM | POA: Diagnosis present

## 2013-12-18 DIAGNOSIS — F0390 Unspecified dementia without behavioral disturbance: Secondary | ICD-10-CM | POA: Diagnosis present

## 2013-12-18 DIAGNOSIS — R5381 Other malaise: Secondary | ICD-10-CM | POA: Diagnosis present

## 2013-12-18 DIAGNOSIS — E872 Acidosis: Secondary | ICD-10-CM | POA: Diagnosis present

## 2013-12-18 DIAGNOSIS — G9349 Other encephalopathy: Secondary | ICD-10-CM | POA: Diagnosis present

## 2013-12-18 DIAGNOSIS — R680 Hypothermia, not associated with low environmental temperature: Secondary | ICD-10-CM | POA: Diagnosis present

## 2013-12-18 DIAGNOSIS — G35 Multiple sclerosis: Secondary | ICD-10-CM | POA: Diagnosis present

## 2013-12-18 DIAGNOSIS — E785 Hyperlipidemia, unspecified: Secondary | ICD-10-CM | POA: Diagnosis present

## 2013-12-18 DIAGNOSIS — A419 Sepsis, unspecified organism: Principal | ICD-10-CM

## 2013-12-18 DIAGNOSIS — Z933 Colostomy status: Secondary | ICD-10-CM | POA: Diagnosis not present

## 2013-12-18 DIAGNOSIS — Z7401 Bed confinement status: Secondary | ICD-10-CM | POA: Diagnosis not present

## 2013-12-18 DIAGNOSIS — N170 Acute kidney failure with tubular necrosis: Secondary | ICD-10-CM

## 2013-12-18 DIAGNOSIS — R4020 Unspecified coma: Secondary | ICD-10-CM | POA: Insufficient documentation

## 2013-12-18 DIAGNOSIS — J9601 Acute respiratory failure with hypoxia: Secondary | ICD-10-CM | POA: Diagnosis present

## 2013-12-18 DIAGNOSIS — N179 Acute kidney failure, unspecified: Secondary | ICD-10-CM

## 2013-12-18 DIAGNOSIS — Z515 Encounter for palliative care: Secondary | ICD-10-CM

## 2013-12-18 DIAGNOSIS — T884XXA Failed or difficult intubation, initial encounter: Secondary | ICD-10-CM

## 2013-12-18 DIAGNOSIS — R0600 Dyspnea, unspecified: Secondary | ICD-10-CM | POA: Diagnosis present

## 2013-12-18 LAB — COMPREHENSIVE METABOLIC PANEL
ALBUMIN: 1.7 g/dL — AB (ref 3.5–5.2)
ALT: 15 U/L (ref 0–35)
ALT: 5 U/L (ref 0–35)
ANION GAP: 21 — AB (ref 5–15)
AST: 19 U/L (ref 0–37)
AST: 17 U/L (ref 0–37)
Albumin: 0.5 g/dL — ABNORMAL LOW (ref 3.5–5.2)
Alkaline Phosphatase: 101 U/L (ref 39–117)
Alkaline Phosphatase: 34 U/L — ABNORMAL LOW (ref 39–117)
Anion gap: 14 (ref 5–15)
BILIRUBIN TOTAL: 0.6 mg/dL (ref 0.3–1.2)
BILIRUBIN TOTAL: 0.3 mg/dL (ref 0.3–1.2)
BUN: 63 mg/dL — AB (ref 6–23)
BUN: 29 mg/dL — ABNORMAL HIGH (ref 6–23)
CHLORIDE: 99 meq/L (ref 96–112)
CHLORIDE: 128 meq/L — AB (ref 96–112)
CO2: 14 mEq/L — ABNORMAL LOW (ref 19–32)
CO2: 7 mEq/L — CL (ref 19–32)
Calcium: 7.9 mg/dL — ABNORMAL LOW (ref 8.4–10.5)
Calcium: <4 mg/dL — CL (ref 8.4–10.5)
Creatinine, Ser: 1.96 mg/dL — ABNORMAL HIGH (ref 0.50–1.10)
Creatinine, Ser: 0.94 mg/dL (ref 0.50–1.10)
GFR calc Af Amer: 35 mL/min — ABNORMAL LOW (ref >90)
GFR calc non Af Amer: 30 mL/min — ABNORMAL LOW (ref >90)
GFR, EST AFRICAN AMERICAN: 84 mL/min — AB (ref >90)
GFR, EST NON AFRICAN AMERICAN: 73 mL/min — AB (ref >90)
GLUCOSE: 274 mg/dL — AB (ref 70–99)
GLUCOSE: 122 mg/dL — AB (ref 70–99)
Potassium: 4.2 mEq/L (ref 3.7–5.3)
Potassium: <2.2 mEq/L — CL (ref 3.7–5.3)
SODIUM: 134 meq/L — AB (ref 137–147)
Sodium: 149 mEq/L — ABNORMAL HIGH (ref 137–147)
Total Protein: 5.9 g/dL — ABNORMAL LOW (ref 6.0–8.3)
Total Protein: 1.8 g/dL — ABNORMAL LOW (ref 6.0–8.3)

## 2013-12-18 LAB — POCT I-STAT 3, ART BLOOD GAS (G3+)
ACID-BASE DEFICIT: 17 mmol/L — AB (ref 0.0–2.0)
ACID-BASE DEFICIT: 12 mmol/L — AB (ref 0.0–2.0)
BICARBONATE: 16.8 meq/L — AB (ref 20.0–24.0)
Bicarbonate: 12.7 mEq/L — ABNORMAL LOW (ref 20.0–24.0)
O2 SAT: 84 %
O2 Saturation: 70 %
PO2 ART: 71 mmHg — AB (ref 80.0–100.0)
TCO2: 14 mmol/L (ref 0–100)
TCO2: 18 mmol/L (ref 0–100)
pCO2 arterial: 48.9 mmHg — ABNORMAL HIGH (ref 35.0–45.0)
pCO2 arterial: 54.8 mmHg — ABNORMAL HIGH (ref 35.0–45.0)
pH, Arterial: 7.032 — CL (ref 7.350–7.450)
pH, Arterial: 7.101 — CL (ref 7.350–7.450)
pO2, Arterial: 57 mmHg — ABNORMAL LOW (ref 80.0–100.0)

## 2013-12-18 LAB — CSF CELL COUNT WITH DIFFERENTIAL
RBC Count, CSF: 38 /mm3 — ABNORMAL HIGH
Tube #: 4
WBC CSF: 5 /mm3 (ref 0–5)

## 2013-12-18 LAB — CBC WITH DIFFERENTIAL/PLATELET
BASOS ABS: 0 10*3/uL (ref 0.0–0.1)
Basophils Relative: 0 % (ref 0–1)
Eosinophils Absolute: 0 10*3/uL (ref 0.0–0.7)
Eosinophils Relative: 0 % (ref 0–5)
HCT: 27.9 % — ABNORMAL LOW (ref 36.0–46.0)
HEMOGLOBIN: 9 g/dL — AB (ref 12.0–15.0)
LYMPHS PCT: 2 % — AB (ref 12–46)
Lymphs Abs: 0.6 10*3/uL — ABNORMAL LOW (ref 0.7–4.0)
MCH: 25.6 pg — ABNORMAL LOW (ref 26.0–34.0)
MCHC: 32.3 g/dL (ref 30.0–36.0)
MCV: 79.5 fL (ref 78.0–100.0)
MONOS PCT: 8 % (ref 3–12)
Monocytes Absolute: 2.3 10*3/uL — ABNORMAL HIGH (ref 0.1–1.0)
NEUTROS ABS: 25.6 10*3/uL — AB (ref 1.7–7.7)
NEUTROS PCT: 90 % — AB (ref 43–77)
Platelets: 373 10*3/uL (ref 150–400)
RBC: 3.51 MIL/uL — ABNORMAL LOW (ref 3.87–5.11)
RDW: 15.5 % (ref 11.5–15.5)
WBC Morphology: INCREASED
WBC: 28.5 10*3/uL — AB (ref 4.0–10.5)

## 2013-12-18 LAB — CBC
HCT: 28.9 % — ABNORMAL LOW (ref 36.0–46.0)
HEMOGLOBIN: 9.2 g/dL — AB (ref 12.0–15.0)
MCH: 25.9 pg — ABNORMAL LOW (ref 26.0–34.0)
MCHC: 31.8 g/dL (ref 30.0–36.0)
MCV: 81.4 fL (ref 78.0–100.0)
RBC: 3.55 MIL/uL — ABNORMAL LOW (ref 3.87–5.11)
RDW: 15.6 % — ABNORMAL HIGH (ref 11.5–15.5)

## 2013-12-18 LAB — I-STAT CG4 LACTIC ACID, ED
LACTIC ACID, VENOUS: 10.34 mmol/L — AB (ref 0.5–2.2)
Lactic Acid, Venous: 6.15 mmol/L — ABNORMAL HIGH (ref 0.5–2.2)

## 2013-12-18 LAB — FIBRINOGEN: FIBRINOGEN: 464 mg/dL (ref 204–475)

## 2013-12-18 LAB — TROPONIN I: Troponin I: <0.3 ng/mL (ref <0.30)

## 2013-12-18 LAB — PROTEIN AND GLUCOSE, CSF
GLUCOSE CSF: 142 mg/dL — AB (ref 43–76)
Total  Protein, CSF: 100 mg/dL — ABNORMAL HIGH (ref 15–45)

## 2013-12-18 LAB — LACTIC ACID, PLASMA: Lactic Acid, Venous: 8.1 mmol/L — ABNORMAL HIGH (ref 0.5–2.2)

## 2013-12-18 LAB — PROTIME-INR
INR: 2.16 — ABNORMAL HIGH (ref 0.00–1.49)
Prothrombin Time: 24.3 seconds — ABNORMAL HIGH (ref 11.6–15.2)

## 2013-12-18 LAB — CORTISOL: Cortisol, Plasma: 150 ug/dL

## 2013-12-18 LAB — MRSA PCR SCREENING: MRSA by PCR: POSITIVE — AB

## 2013-12-18 LAB — APTT: APTT: 45 s — AB (ref 24–37)

## 2013-12-18 MED ORDER — LIDOCAINE HCL (CARDIAC) 20 MG/ML IV SOLN
INTRAVENOUS | Status: AC
  Filled 2013-12-18: qty 5

## 2013-12-18 MED ORDER — FENTANYL CITRATE 0.05 MG/ML IJ SOLN: 100.0000 ug | INTRAMUSCULAR | Status: DC | PRN

## 2013-12-18 MED ORDER — VANCOMYCIN HCL IN DEXTROSE 750-5 MG/150ML-% IV SOLN: 750.0000 mg | INTRAVENOUS | Status: DC

## 2013-12-18 MED ORDER — SUCCINYLCHOLINE CHLORIDE 20 MG/ML IJ SOLN
80.0000 mg | Freq: Once | INTRAMUSCULAR | Status: AC
  Administered 2013-12-18: 80 mg via INTRAVENOUS

## 2013-12-18 MED ORDER — ROCURONIUM BROMIDE 50 MG/5ML IV SOLN
INTRAVENOUS | Status: AC
  Filled 2013-12-18: qty 2

## 2013-12-18 MED ORDER — SODIUM BICARBONATE 8.4 % IV SOLN
INTRAVENOUS | Status: DC
  Filled 2013-12-18 (×2): qty 150

## 2013-12-18 MED ORDER — MAGNESIUM SULFATE 2 GM/50ML IV SOLN: 2.0000 g | Freq: Once | INTRAVENOUS | Status: DC

## 2013-12-18 MED ORDER — VANCOMYCIN HCL IN DEXTROSE 1-5 GM/200ML-% IV SOLN
1000.0000 mg | Freq: Once | INTRAVENOUS | Status: AC
  Administered 2013-12-18: 1000 mg via INTRAVENOUS
  Filled 2013-12-18: qty 200

## 2013-12-18 MED ORDER — DEXTROSE 5 % IV SOLN
500.0000 mg | Freq: Two times a day (BID) | INTRAVENOUS | Status: DC
  Filled 2013-12-18 (×2): qty 10

## 2013-12-18 MED ORDER — SUCCINYLCHOLINE CHLORIDE 20 MG/ML IJ SOLN
INTRAMUSCULAR | Status: AC
  Filled 2013-12-18: qty 1

## 2013-12-18 MED ORDER — ETOMIDATE 2 MG/ML IV SOLN
INTRAVENOUS | Status: AC
  Filled 2013-12-18: qty 20

## 2013-12-18 MED ORDER — SODIUM CHLORIDE 0.9 % IV BOLUS (SEPSIS): 1000.0000 mL | Freq: Once | INTRAVENOUS | Status: DC

## 2013-12-18 MED ORDER — ETOMIDATE 2 MG/ML IV SOLN
20.0000 mg | Freq: Once | INTRAVENOUS | Status: AC
  Administered 2013-12-18: 20 mg via INTRAVENOUS

## 2013-12-18 MED ORDER — SODIUM CHLORIDE 0.9 % IV BOLUS (SEPSIS)
1000.0000 mL | Freq: Once | INTRAVENOUS | Status: AC
  Administered 2013-12-18: 1000 mL via INTRAVENOUS

## 2013-12-18 MED ORDER — SODIUM BICARBONATE 8.4 % IV SOLN
INTRAVENOUS | Status: AC
  Administered 2013-12-18: 100 meq via INTRAVENOUS
  Filled 2013-12-18: qty 100

## 2013-12-18 MED ORDER — HEPARIN SODIUM (PORCINE) 5000 UNIT/ML IJ SOLN: 5000.0000 [IU] | Freq: Three times a day (TID) | INTRAMUSCULAR | Status: DC

## 2013-12-18 MED ORDER — DEXTROSE 5 % IV SOLN
0.0000 ug/min | INTRAVENOUS | Status: DC
  Administered 2013-12-18: 200 ug/min via INTRAVENOUS
  Filled 2013-12-18 (×2): qty 4

## 2013-12-18 MED ORDER — SODIUM CHLORIDE 0.9 % IV SOLN
1.0000 g | Freq: Two times a day (BID) | INTRAVENOUS | Status: DC
  Administered 2013-12-18: 1 g via INTRAVENOUS
  Filled 2013-12-18 (×2): qty 1

## 2013-12-18 MED ORDER — PIPERACILLIN-TAZOBACTAM IN DEX 2-0.25 GM/50ML IV SOLN
2.2500 g | Freq: Three times a day (TID) | INTRAVENOUS | Status: DC
  Filled 2013-12-18 (×2): qty 50

## 2013-12-18 MED ORDER — PROPOFOL 10 MG/ML IV EMUL
5.0000 ug/kg/min | INTRAVENOUS | Status: DC
  Administered 2013-12-18: 5 ug/kg/min via INTRAVENOUS

## 2013-12-18 MED ORDER — VASOPRESSIN 20 UNIT/ML IV SOLN
0.0300 [IU]/min | INTRAVENOUS | Status: DC
  Administered 2013-12-18 (×3): 0.03 [IU]/min via INTRAVENOUS
  Filled 2013-12-18: qty 2

## 2013-12-18 MED ORDER — SODIUM CHLORIDE 0.9 % IV SOLN: INTRAVENOUS | Status: DC

## 2013-12-18 MED ORDER — SODIUM CHLORIDE 0.9 % IV BOLUS (SEPSIS)
1000.0000 mL | INTRAVENOUS | Status: AC
  Administered 2013-12-18 (×2): 1000 mL via INTRAVENOUS

## 2013-12-18 MED ORDER — PIPERACILLIN-TAZOBACTAM 3.375 G IVPB 30 MIN
3.3750 g | Freq: Once | INTRAVENOUS | Status: AC
  Administered 2013-12-18: 3.375 g via INTRAVENOUS
  Filled 2013-12-18: qty 50

## 2013-12-18 MED ORDER — PANTOPRAZOLE SODIUM 40 MG IV SOLR: 40.0000 mg | Freq: Every day | INTRAVENOUS | Status: DC

## 2013-12-18 MED ORDER — SUCCINYLCHOLINE CHLORIDE 20 MG/ML IJ SOLN
INTRAMUSCULAR | Status: DC
Start: 2013-12-18 — End: 2013-12-19
  Filled 2013-12-18: qty 1

## 2013-12-18 MED ORDER — SODIUM BICARBONATE 8.4 % IV SOLN
100.0000 meq | Freq: Once | INTRAVENOUS | Status: AC
  Administered 2013-12-18: 100 meq via INTRAVENOUS

## 2013-12-18 MED ORDER — PROPOFOL 10 MG/ML IV EMUL
INTRAVENOUS | Status: AC
  Filled 2013-12-18: qty 100

## 2013-12-18 MED ORDER — HYDROCORTISONE NA SUCCINATE PF 100 MG IJ SOLR
50.0000 mg | Freq: Four times a day (QID) | INTRAMUSCULAR | Status: DC
Start: 2013-12-18 — End: 2013-12-18
  Filled 2013-12-18 (×3): qty 1

## 2013-12-18 MED ORDER — INSULIN ASPART 100 UNIT/ML ~~LOC~~ SOLN: 2.0000 [IU] | SUBCUTANEOUS | Status: DC

## 2013-12-18 MED ORDER — NOREPINEPHRINE BITARTRATE 1 MG/ML IV SOLN
2.0000 ug/min | INTRAVENOUS | Status: DC
  Filled 2013-12-18: qty 4

## 2013-12-18 MED ORDER — HYDROCORTISONE NA SUCCINATE PF 100 MG IJ SOLR
50.0000 mg | Freq: Four times a day (QID) | INTRAMUSCULAR | Status: DC
  Administered 2013-12-18: 50 mg via INTRAVENOUS
  Filled 2013-12-18 (×3): qty 1

## 2013-12-18 MED ORDER — VASOPRESSIN 20 UNIT/ML IV SOLN
0.0300 [IU]/min | INTRAVENOUS | Status: DC
  Filled 2013-12-18: qty 2

## 2013-12-18 MED ORDER — PHENYLEPHRINE HCL 10 MG/ML IJ SOLN
20.0000 ug/min | INTRAMUSCULAR | Status: DC
  Administered 2013-12-18: 30 ug/min via INTRAVENOUS
  Filled 2013-12-18: qty 1

## 2013-12-18 MED ORDER — DEXTROSE 5 % IV SOLN: 2.0000 g | INTRAVENOUS | Status: DC

## 2013-12-18 MED ORDER — MORPHINE SULFATE 2 MG/ML IJ SOLN
2.0000 mg | INTRAMUSCULAR | Status: DC | PRN
Start: 2013-12-18 — End: 2013-12-19

## 2013-12-18 MED ORDER — SODIUM CHLORIDE 0.9 % IV BOLUS (SEPSIS)
1000.0000 mL | INTRAVENOUS | Status: AC
  Administered 2013-12-18: 1000 mL via INTRAVENOUS

## 2013-12-18 MED ORDER — ACETAMINOPHEN 650 MG RE SUPP
650.0000 mg | Freq: Once | RECTAL | Status: AC
  Administered 2013-12-18: 650 mg via RECTAL

## 2013-12-19 LAB — GRAM STAIN

## 2013-12-20 LAB — HERPES SIMPLEX VIRUS(HSV) DNA BY PCR
HSV 1 DNA: NOT DETECTED
HSV 2 DNA: NOT DETECTED

## 2013-12-21 LAB — CULTURE, BLOOD (ROUTINE X 2)

## 2013-12-22 LAB — CSF CULTURE W GRAM STAIN

## 2013-12-22 LAB — CSF CULTURE: Culture: NO GROWTH

## 2013-12-22 NOTE — Consult Note (Addendum)
WOC wound consult note Reason for Consult: Consult requested for sacrum wound.  [facility]. 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 Sport bed in place to reduce pressure.  WOC ostomy consult note Stoma type/location:  Stoma to left lower quad, colostomy was performed several months ago according to the EMR; pt is in a SNF with total assistance for pouching routines Stomal assessment/size:  1 1/2 inches, slightly above skin level Peristomal assessment:  Mucocutaneous separation has occurred from 5:00 o'clock to 6:00 o'clock.  Depth is 2 cm when swab inserted, appears to be beefy red but difficult to visualize r/t narrow opening. Output  50cc semiformed brown stool Ostomy pouching: 1pc.  Education provided: No family present and pt is sedated and on vent.  Applied 2 piece pouch.  Pt could benefit from barrier ring to maintain seal.  Supplies ordered to room for bedside nurse use. Please re-consult if further assistance is needed.  Thank-you,  Cammie Mcgeeawn Amar Sippel MSN, RN, CWOCN, [place]AP, CNS [phone]

## 2013-12-22 NOTE — Procedures (Signed)
Intubation Procedure Note [name] [name] [id number] 1970/06/03  Procedure: Intubation Indications: Airway protection and maintenance  Procedure Details Consent: Risks of procedure as well as the alternatives and risks of each were explained to the (patient/caregiver).  Consent for procedure obtained. and Unable to obtain consent because of altered level of consciousness. Time Out: Verified patient identification, verified procedure, site/side was marked, verified correct patient position, special equipment/implants available, medications/allergies/relevent history reviewed, required imaging and test results available.  Performed  Maximum sterile technique was used including gloves, gown, hand hygiene and mask.  3    Evaluation Hemodynamic Status: BP stable throughout; O2 sats: stable throughout Patient's Current Condition: stable Complications: No apparent complications Patient did tolerate procedure well. Chest X-ray ordered to verify placement.  CXR: pending   Pt intubated for airway protection due to altered level of mental status. Pt NT suctioned prior with moderate to copious amount of white/tan thin secretions out. Pt with a difficult intubation by ED MD with several attempts and CRNA coming and attempting x2. Pt intubated using 6.5 ETT secured at 20cm at lip with CXR pending. Bilateral breath sounds, positive color change on ETCO2 detector noted.    Carolan Shiver 12-28-2013

## 2013-12-22 NOTE — ED Notes (Signed)
Transported to do bedside report

## 2013-12-22 NOTE — H&P (Addendum)
PULMONARY / CRITICAL CARE MEDICINE   Name: [name] [name] MRN: [id number] DOB: [birth year]    ADMISSION DATE:  Dec 21, 2013 CONSULTATION DATE:  12-21-13  REFERRING MD :  EDP  CHIEF COMPLAINT:  AMS and respiratory failure  INITIAL PRESENTATION: 43 year old female with severe MS who presents from SNF after being found unresponsive.  Patient was brought to the ED, was fluid resuscitated then started to have significant change in mental status and was unable to protect her airway.  Patient was intubated by anesthesia (apparently a rather difficult airway) and PCCM was called to admit.  Multiple potential sources of infection including stage 4 decubs, indwelling urinary catheter and aspiration.  Patient is profoundly debilitated with indwelling urinary catheter, colostomy and PEG tube, bed ridden.  EDP spoke with son who insisted on full support.  STUDIES:  11/27>>> Head CT negative, CXR with ?aspiration.  SIGNIFICANT EVENTS: 11/27>>>ETT and sepsis protocol.  PAST MEDICAL HISTORY :   has a past medical history of Multiple sclerosis; HYPERTENSION (09/30/2006); TIA (transient ischemic attack); TOBACCO USER (09/30/2006); Multiple sclerosis, relapsing-remitting (05/29/2012); DYSLIPIDEMIA (09/30/2006); BENIGN POSITIONAL VERTIGO (01/27/2010); Depression; DIABETES MELLITUS, TYPE II (09/30/2006); Neuromuscular disorder; Stroke; and Sacral wound.  has past surgical history that includes PEG tube placement (2015) and Laparoscopic diverted colostomy (N/A, 11/26/2013). Prior to Admission medications   Medication Sig Start Date End Date Taking? Authorizing Provider  Amino Acids-Protein Hydrolys (FEEDING SUPPLEMENT, PRO-STAT SUGAR FREE 64,) LIQD Take 30 mLs by mouth daily before supper.   Yes Historical Provider, MD  amLODipine (NORVASC) 5 MG tablet Take 1 tablet (5 mg total) by mouth daily. 07/24/13  Yes Daniel J Angiulli, PA-C  ascorbic acid (VITAMIN C) 500 MG tablet Take 500 mg by mouth 2 (two) times daily.   Yes  Historical Provider, MD  aspirin EC 81 MG tablet Take 1 tablet (81 mg total) by mouth daily. 07/24/13  Yes Daniel J Angiulli, PA-C  baclofen (LIORESAL) 10 MG tablet Take 5 mg by mouth 3 (three) times daily.   Yes Historical Provider, MD  citalopram (CELEXA) 10 MG tablet Take 1 tablet (10 mg total) by mouth daily. 08/13/13  Yes Adam Gus Rankin, DO  fentaNYL (DURAGESIC - DOSED MCG/HR) 25 MCG/HR patch Place 25 mcg onto the skin every 3 (three) days.   Yes Historical Provider, MD  HYDROcodone-acetaminophen (NORCO/VICODIN) 5-325 MG per tablet Take 1 tablet by mouth every 6 (six) hours as needed for moderate pain or severe pain.    Yes Historical Provider, MD  meclizine (ANTIVERT) 25 MG tablet Take 25 mg by mouth every 8 (eight) hours as needed for dizziness or nausea.   Yes Historical Provider, MD  metFORMIN (GLUCOPHAGE) 500 MG tablet Take 1 tablet (500 mg total) by mouth daily with breakfast. 07/24/13  Yes Mcarthur Rossetti Angiulli, PA-C  metoprolol tartrate (LOPRESSOR) 25 MG tablet Take 1 tablet (25 mg total) by mouth 2 (two) times daily. 07/24/13  Yes Daniel J Angiulli, PA-C  potassium chloride (MICRO-K) 10 MEQ CR capsule Take 10 mEq by mouth daily.   Yes Historical Provider, MD  saccharomyces boulardii (FLORASTOR) 250 MG capsule Take 1 capsule (250 mg total) by mouth 2 (two) times daily. 09/04/13  Yes Zannie Cove, MD  zinc sulfate 220 MG capsule Take 220 mg by mouth daily.   Yes Historical Provider, MD  Nutritional Supplements (FEEDING SUPPLEMENT, OSMOLITE 1.2 CAL,) LIQD Place 1,000 mLs into feeding tube continuous.    Historical Provider, MD  TUBERCULIN PPD ID Inject 0.1 mLs into the skin.  Every 10 days    Historical Provider, MD   No Known Allergies  FAMILY HISTORY:  has no family status information on file.  SOCIAL HISTORY:  reports that she quit smoking about 2 years ago. Her smoking use included Cigarettes. She has a 10 pack-year smoking history. She has never used smokeless tobacco. She reports that  she does not drink alcohol or use illicit drugs.  REVIEW OF SYSTEMS:  Unattainble, sedated and intubated.  SUBJECTIVE: VDRF post aspiration and AMS.  VITAL SIGNS: Temp:  [100.2 F (37.9 C)-102.4 F (39.1 C)] 100.8 F (38.2 C) (11/27 1241) Pulse Rate:  [118-135] 124 (11/27 1301) Resp:  [18-43] 29 (11/27 1300) BP: (101-173)/(54-109) 173/109 mmHg (11/27 1301) SpO2:  [85 %-100 %] 93 % (11/27 1301) FiO2 (%):  [40 %] 40 % (11/27 1318) HEMODYNAMICS:   VENTILATOR SETTINGS: Vent Mode:  [-] PRVC FiO2 (%):  [40 %] 40 % Set Rate:  [12 bmp] 12 bmp Vt Set:  [400 mL] 400 mL PEEP:  [5 cmH20] 5 cmH20 Plateau Pressure:  [26 cmH20] 26 cmH20 INTAKE / OUTPUT:  Intake/Output Summary (Last 24 hours) at 2013-12-20 1347 Last data filed at 20-Dec-2013 1048  Gross per 24 hour  Intake   2000 ml  Output      0 ml  Net   2000 ml    PHYSICAL EXAMINATION: General:  Chronically ill appearing female, contracted, sedated and intubated. Neuro:  Sedated and intubated and paralyzed, unable to perform neuro exam. HEENT:  [place]/AT, PERRL, EOM-I and MMM. Cardiovascular:  RRR, Nl S1/S2, -M/R/G. Lungs:  Diffuse crackles. Abdomen:  Soft, NT, ND and +BS.  Large mass at the RLQ with bandaids covering laparotomy incision site. Musculoskeletal:  Contracted lower ext, no edema. Skin:  Large decub, stage 4.  LABS:  CBC  Recent Labs Lab 12/14/13 0902 2013-12-20 0945  WBC 17.3* 28.5*  HGB 11.6* 9.0*  HCT 36.0 27.9*  PLT 624* 373   Coag's  Recent Labs Lab 12/14/13 0902  INR 1.35   BMET  Recent Labs Lab 2013/12/20 0945  NA 134*  K 4.2  CL 99  CO2 14*  BUN 63*  CREATININE 1.96*  GLUCOSE 274*   Electrolytes  Recent Labs Lab 20-Dec-2013 0945  CALCIUM 7.9*   Sepsis Markers  Recent Labs Lab 12/20/13 0815 12/20/13 1250  LATICACIDVEN 6.15* 10.34*   ABG No results for input(s): PHART, PCO2ART, PO2ART in the last 168 hours. Liver Enzymes  Recent Labs Lab 12-20-13 0945  AST 19  ALT 15  ALKPHOS  101  BILITOT 0.6  ALBUMIN 1.7*   Cardiac Enzymes No results for input(s): TROPONINI, PROBNP in the last 168 hours. Glucose  Recent Labs Lab 12/14/13 1003  GLUCAP 142*    Imaging No results found.   ASSESSMENT / PLAN:  PULMONARY OETT 11/27>>> A: VDRF due to inability to protect airway and likely major aspiration. P:   - Full vent support. - F/U ABG and adjust vent accordingly. - F/U CXR.  CARDIOVASCULAR CVL R Dixon TLC 11/27>>> A: Septic shock protocol.  See ID section. P:  - Fluid resuscitation. - Sepsis protocol. - Neo for BP support (tachy). - See ID section.  RENAL A:  Baseline Cr unknown. P:   - Hydrate. - BMET in AM. - Replace electrolytes as indicated.  GASTROINTESTINAL A:  Large mass in the RLQ. P:   - TF per nutrition. - Abdominal CT without contrast.  HEMATOLOGIC A:  Leukocytosis. P:  - See ID section. - CBC in  AM. - Transfusion per ICU protocol.  INFECTIOUS A:  Unknown source of infection.  Too many possibilities.  Neck was stiff on presentation and AMS, septic and never regained consciousness.  ?meningitis. P:   BCx2 11/27>>> UC 11/27>>> Sputum 11/27>>> Abx: Vancomycin/zosyn/Rocephin/Amp 11/27>>> LP today. If LP is negative will d/c rocephin and amp.  ENDOCRINE A:  DM by history.  Hypotensive and hypothermic.   P:   - Stress dose steroids. - ISS. - CBGs. - Check cortisol level.  NEUROLOGIC A:    Multiple Sclerosis and related dementia Acute encephalopathy  likely due to sepsis.  Can not r/o meningitis.  Will treat as meningitis. P:   RASS goal: 0 Fentanyl PRN.  FAMILY  - Updates: No family bedside.  - Inter-disciplinary family meet or Palliative Care meeting due by:     TODAY'S SUMMARY: this is a demented 43 year old female w/ known h/o debilitating MS, resides at SNF where she requires full assist w/ All ADLs. She is PEG dependent w/ chronic foley. Has large un-stagable sacral ulcer. Presents w/ acute MS change and  septic shock. Potential sources include:aspiration PNA, chronic indwelling catheter, sacral decub ulcer or some combo of all three. Have placed CVL, started broad spec abx and she has gotten her initial fluid challenge w/ worsening lactate. She will be admitted to the intensive care for full support, further resusitation and empiric abx. Will go ahead and plan for LP as well given MS change.     The patient is critically ill with multiple organ systems failure and requires high complexity decision making for assessment and support, frequent evaluation and titration of therapies, application of advanced monitoring technologies and extensive interpretation of multiple databases.   Critical Care Time devoted to patient care services described in this note is 90 Minutes. This time reflects time of care of this signee Dr Koren BoundWesam Charan Prieto. This critical care time does not reflect procedure time, or teaching time or supervisory time of PA/NP/Med student/Med Resident etc but could involve care discussion time.  Alyson ReedyWesam G. Gerhard Rappaport, M.D. [facility]. Pager: [phone]. After hours pager: [phone].  01-26-2013, 1:47 PM

## 2013-12-22 NOTE — ED Notes (Signed)
Dr. Patria Maneampos at the bedside to assist. Pt being bagged at this time.

## 2013-12-22 NOTE — Progress Notes (Signed)
Provided pt's son with funeral/cremation resources.

## 2013-12-22 NOTE — Progress Notes (Signed)
Transported pt from ED Trauma C to 2H06 on ventilator. Report given to RT at bedside

## 2013-12-22 NOTE — Progress Notes (Signed)
UR completed.  Catelynn Sparger, RN BSN [facility] CCM Trauma/Neuro ICU Case Manager [phone]  

## 2013-12-22 NOTE — Progress Notes (Signed)
Pt in from NH with low spo2. Placed on NRB with spo2 100%. RT will continue to monitor.

## 2013-12-22 NOTE — Progress Notes (Signed)
ANTIBIOTIC CONSULT NOTE - INITIAL  Pharmacy Consult for Vancomycin and Zosyn Indication: rule out sepsis  No Known Allergies  Patient Measurements:   Wt Readings from Last 3 Encounters:  12/14/13 113 lb (51.256 kg)  11/26/13 113 lb (51.256 kg)  09/30/13 113 lb (51.256 kg)     Vital Signs: Temp: 102.4 F (39.1 C) (11/27 0806) Temp Source: Rectal (11/27 0806) BP: 137/95 mmHg (11/27 0750) Pulse Rate: 132 (11/27 0750) Intake/Output from previous day:   Intake/Output from this shift:    Labs:  Recent Labs  2014-01-07 0945  WBC 28.5*  HGB 9.0*  PLT 373  CREATININE 1.96*   Estimated Creatinine Clearance: 27.6 mL/min (by C-G formula based on Cr of 1.96). No results for input(s): VANCOTROUGH, VANCOPEAK, VANCORANDOM, GENTTROUGH, GENTPEAK, GENTRANDOM, TOBRATROUGH, TOBRAPEAK, TOBRARND, AMIKACINPEAK, AMIKACINTROU, AMIKACIN in the last 72 hours.   Microbiology: Recent Results (from the past 720 hour(s))  MRSA PCR Screening     Status: Abnormal   Collection Time: 11/27/13  9:09 PM  Result Value Ref Range Status   MRSA by PCR POSITIVE (A) NEGATIVE Final    Comment:        The GeneXpert MRSA Assay (FDA approved for NASAL specimens only), is one component of a comprehensive MRSA colonization surveillance program. It is not intended to diagnose MRSA infection nor to guide or monitor treatment for MRSA infections. RESULT CALLED TO, READ BACK BY AND VERIFIED WITH: L.ABIERA,RN AT 2329 BY L.PITT 11/27/13     Medical History: Past Medical History  Diagnosis Date  . Multiple sclerosis   . HYPERTENSION 09/30/2006  . TIA (transient ischemic attack)   . TOBACCO USER 09/30/2006    Qualifier: Diagnosis of  By: Delrae Alfred MD, Lanora Manis    . Multiple sclerosis, relapsing-remitting 05/29/2012  . DYSLIPIDEMIA 09/30/2006    Qualifier: Diagnosis of  By: Delrae Alfred MD, Lanora Manis    . BENIGN POSITIONAL VERTIGO 01/27/2010    Qualifier: Diagnosis of  By: Delrae Alfred MD, Lanora Manis    . Depression   .  DIABETES MELLITUS, TYPE II 09/30/2006    Qualifier: Diagnosis of  By: Delrae Alfred MD, Lanora Manis    . Neuromuscular disorder     MS  . Stroke     pt denies this hx on 09/02/2013  . Sacral wound     Medications:  Anti-infectives    Start     Dose/Rate Route Frequency Ordered Stop   Jan 07, 2014 0815  piperacillin-tazobactam (ZOSYN) IVPB 3.375 g     3.375 g100 mL/hr over 30 Minutes Intravenous  Once 01-07-2014 0800     01/07/14 0815  vancomycin (VANCOCIN) IVPB 1000 mg/200 mL premix     1,000 mg200 mL/hr over 60 Minutes Intravenous  Once 01/07/2014 0800       Assessment: 43 year old female with advanced multiple sclerosis.  She is to continue empiric Vancomycin and Zosyn for rule out sepsis.  Note her baseline SCr is low due to debilitated condition, and her elevated SCr of 1.96 represents significant acute renal dysfunction for her.  I will adjust her antibiotic doses for an estimated CrCl of 10-20 ml/min.  Goal of Therapy:  Vancomycin trough level 15-20 mcg/ml  Plan:  Vanc 750mg  IV q48h Zosyn 2.25gm IV q8h  Monitor renal function closely with consideration that her baseline SCr is only ~0.3. Follow available micro data  Estella Husk, Pharm.D., BCPS, AAHIVP Clinical Pharmacist Phone: [phone] or [phone] 01-07-14, 8:23 AM

## 2013-12-22 NOTE — ED Notes (Signed)
Pt remains monitored by blood pressure, pulse ox, and 12 lead. pts family remains at bedside.  

## 2013-12-22 NOTE — Progress Notes (Signed)
ETT advanced from 20 cm at the lip to 22 cm at the lip per MD and CXR. No complications noted.

## 2013-12-22 NOTE — ED Provider Notes (Signed)
CSN: [id number]     Arrival date & time 06-11-2013  0744 History   First MD Initiated Contact with Patient 06-11-2013 0750     Chief Complaint  Patient presents with  . Altered Mental Status     (Consider location/radiation/quality/duration/timing/severity/associated sxs/prior Treatment) HPI Comments: Patient with a history of diabetes, advanced MS and questionable stroke brought in by EMS from a skilled nursing facility for altered mental status. Per nursing home report, patient was normal at 4:30 this a.m. and when they checked on her this morning just prior to arrival she was unresponsive. Per EMS, she was hypotensive and tachycardic. They checked a blood sugar and it was elevated. They were unable to get IV access and started an intraosseous line in her left humerus. She was not noted to be febrile. Per the nursing staff she has some ongoing left-sided weakness which they thought was more pronounced than her baseline. She's been uncommunicative.   Past Medical History  Diagnosis Date  . Multiple sclerosis   . HYPERTENSION 09/30/2006  . TIA (transient ischemic attack)   . TOBACCO USER 09/30/2006    Qualifier: Diagnosis of  By: Delrae AlfredMulberry MD, [name]    . Multiple sclerosis, relapsing-remitting 05/29/2012  . DYSLIPIDEMIA 09/30/2006    Qualifier: Diagnosis of  By: Delrae AlfredMulberry MD, [name]    . BENIGN POSITIONAL VERTIGO 01/27/2010    Qualifier: Diagnosis of  By: Delrae AlfredMulberry MD, [name]    . Depression   . DIABETES MELLITUS, TYPE II 09/30/2006    Qualifier: Diagnosis of  By: Delrae AlfredMulberry MD, [name]    . Neuromuscular disorder     MS  . Stroke     pt denies this hx on 09/02/2013  . Sacral wound    Past Surgical History  Procedure Laterality Date  . Peg tube placement  2015  . Laparoscopic diverted colostomy N/A 11/26/2013    Procedure: LAPAROSCOPIC ASSISTED DESCENDING END COLOSTOMY;  Surgeon: Abigail Miyamotoouglas Blackman, MD;  Location: [facility] OR;  Service: General;  Laterality: N/A;   Family History  Problem  Relation Age of Onset  . Hypertension Mother   . Kidney failure Mother   . Hypertension Father   . Gout Father   . Hypertension Sister   . Diabetes Sister   . Hypertension Brother    History  Substance Use Topics  . Smoking status: Former Smoker -- 0.50 packs/day for 20 years    Types: Cigarettes    Quit date: 07/07/2011  . Smokeless tobacco: Never Used  . Alcohol Use: No   OB History    No data available     Review of Systems  Unable to perform ROS: Mental status change      Allergies  Review of patient's allergies indicates no known allergies.  Home Medications   Prior to Admission medications   Medication Sig Start Date End Date Taking? Authorizing Provider  Amino Acids-Protein Hydrolys (FEEDING SUPPLEMENT, PRO-STAT SUGAR FREE 64,) LIQD Take 30 mLs by mouth daily before supper.   Yes Historical Provider, MD  amLODipine (NORVASC) 5 MG tablet Take 1 tablet (5 mg total) by mouth daily. 07/24/13  Yes Daniel J Angiulli, PA-C  ascorbic acid (VITAMIN C) 500 MG tablet Take 500 mg by mouth 2 (two) times daily.   Yes Historical Provider, MD  aspirin EC 81 MG tablet Take 1 tablet (81 mg total) by mouth daily. 07/24/13  Yes Daniel J Angiulli, PA-C  baclofen (LIORESAL) 10 MG tablet Take 5 mg by mouth 3 (three) times daily.   Yes  Historical Provider, MD  citalopram (CELEXA) 10 MG tablet Take 1 tablet (10 mg total) by mouth daily. 08/13/13  Yes Adam Gus Rankin, DO  fentaNYL (DURAGESIC - DOSED MCG/HR) 25 MCG/HR patch Place 25 mcg onto the skin every 3 (three) days.   Yes Historical Provider, MD  HYDROcodone-acetaminophen (NORCO/VICODIN) 5-325 MG per tablet Take 1 tablet by mouth every 6 (six) hours as needed for moderate pain or severe pain.    Yes Historical Provider, MD  meclizine (ANTIVERT) 25 MG tablet Take 25 mg by mouth every 8 (eight) hours as needed for dizziness or nausea.   Yes Historical Provider, MD  metFORMIN (GLUCOPHAGE) 500 MG tablet Take 1 tablet (500 mg total) by mouth  daily with breakfast. 07/24/13  Yes Mcarthur Rossetti Angiulli, PA-C  metoprolol tartrate (LOPRESSOR) 25 MG tablet Take 1 tablet (25 mg total) by mouth 2 (two) times daily. 07/24/13  Yes Daniel J Angiulli, PA-C  potassium chloride (MICRO-K) 10 MEQ CR capsule Take 10 mEq by mouth daily.   Yes Historical Provider, MD  saccharomyces boulardii (FLORASTOR) 250 MG capsule Take 1 capsule (250 mg total) by mouth 2 (two) times daily. 09/04/13  Yes Zannie Cove, MD  zinc sulfate 220 MG capsule Take 220 mg by mouth daily.   Yes Historical Provider, MD  Nutritional Supplements (FEEDING SUPPLEMENT, OSMOLITE 1.2 CAL,) LIQD Place 1,000 mLs into feeding tube continuous.    Historical Provider, MD  TUBERCULIN PPD ID Inject 0.1 mLs into the skin. Every 10 days    Historical Provider, MD   BP 70/47 mmHg  Pulse 54  Temp(Src) 100.8 F (38.2 C) (Rectal)  Resp 26  Ht 5\' 4"  (1.626 m)  SpO2 72%  LMP 08/23/2012 Physical Exam  Constitutional: She appears well-developed.  thin  HENT:  Head: Normocephalic and atraumatic.  Eyes: Pupils are equal, round, and reactive to light.  Neck: Normal range of motion. Neck supple.  Cardiovascular: Normal rate, regular rhythm and normal heart sounds.   Pulmonary/Chest: Effort normal and breath sounds normal. No respiratory distress. She has no wheezes. She has no rales. She exhibits no tenderness.  Abdominal: Soft. Bowel sounds are normal. There is no tenderness. There is no rebound and no guarding.  Musculoskeletal: Normal range of motion. She exhibits no edema.  Patient is a large sacral decubitus. There is no malodorous discharge or evidence of cellulitis  Lymphadenopathy:    She has no cervical adenopathy.  Neurological: She is alert.  Patient is awake with eyes open but is noncommunicative. She does not follow commands she seems to have some left-sided weakness as compared to the right but it's difficult to assess.  Skin: Skin is warm and dry. No rash noted.  Psychiatric: She has a  normal mood and affect.    ED Course  Procedures (including critical care time) Labs Review Labs Reviewed  CBC WITH DIFFERENTIAL - Abnormal; Notable for the following:    WBC 28.5 (*)    RBC 3.51 (*)    Hemoglobin 9.0 (*)    HCT 27.9 (*)    MCH 25.6 (*)    Neutrophils Relative % 90 (*)    Lymphocytes Relative 2 (*)    Neutro Abs 25.6 (*)    Lymphs Abs 0.6 (*)    Monocytes Absolute 2.3 (*)    All other components within normal limits  COMPREHENSIVE METABOLIC PANEL - Abnormal; Notable for the following:    Sodium 134 (*)    CO2 14 (*)    Glucose, Bld 274 (*)  BUN 63 (*)    Creatinine, Ser 1.96 (*)    Calcium 7.9 (*)    Total Protein 5.9 (*)    Albumin 1.7 (*)    GFR calc non Af Amer 30 (*)    GFR calc Af Amer 35 (*)    Anion gap 21 (*)    All other components within normal limits  I-STAT CG4 LACTIC ACID, ED - Abnormal; Notable for the following:    Lactic Acid, Venous 6.15 (*)    All other components within normal limits  I-STAT CG4 LACTIC ACID, ED - Abnormal; Notable for the following:    Lactic Acid, Venous 10.34 (*)    All other components within normal limits  POCT I-STAT 3, ART BLOOD GAS (G3+) - Abnormal; Notable for the following:    pH, Arterial 7.032 (*)    pCO2 arterial 48.9 (*)    pO2, Arterial 57.0 (*)    Bicarbonate 12.7 (*)    Acid-base deficit 17.0 (*)    All other components within normal limits  CULTURE, BLOOD (ROUTINE X 2)  CULTURE, BLOOD (ROUTINE X 2)  URINE CULTURE  CULTURE, BLOOD (ROUTINE X 2)  CULTURE, BLOOD (ROUTINE X 2)  URINE CULTURE  CULTURE, EXPECTORATED SPUTUM-ASSESSMENT  MRSA PCR SCREENING  CSF CULTURE  URINALYSIS, ROUTINE W REFLEX MICROSCOPIC  CBC  COMPREHENSIVE METABOLIC PANEL  LACTIC ACID, PLASMA  URINALYSIS, ROUTINE W REFLEX MICROSCOPIC  CORTISOL  TROPONIN I  PROTIME-INR  APTT  FIBRINOGEN  CORTISOL  BLOOD GAS, ARTERIAL  CSF CELL COUNT WITH DIFFERENTIAL  PROTEIN AND GLUCOSE, CSF  HERPES SIMPLEX VIRUS(HSV) DNA BY PCR   CBG MONITORING, ED  TYPE AND SCREEN    Imaging Review Dg Chest 1 View  2013/12/31   CLINICAL DATA:  Altered mental status, history stroke, multiple sclerosis, hypertension, smoking, type 2 diabetes, dyslipidemia  EXAM: CHEST - 1 VIEW  COMPARISON:  Portable exam 0802 hr compared to 09/01/2013  FINDINGS: Normal heart size, mediastinal contours, and pulmonary vascularity.  Lungs clear.  No pleural effusion or pneumothorax.  Osseous structures unremarkable.  IMPRESSION: No acute abnormalities.   Electronically Signed   By: Ulyses Southward M.D.   On: 31-Dec-2013 08:11   Ct Head Wo Contrast  12/31/2013   CLINICAL DATA:  Altered mental status, oxygen desaturation, history multiple sclerosis, TIA, stroke, diabetes, hypertension, smoking  EXAM: CT HEAD WITHOUT CONTRAST  TECHNIQUE: Contiguous axial images were obtained from the base of the skull through the vertex without intravenous contrast.  COMPARISON:  09/01/2013  FINDINGS: Asymmetric positioning in gantry due to patient condition.  Generalized atrophy.  No midline shift or mass effect.  Diffuse white matter hypoattenuation again identified.  No definite intracranial hemorrhage, mass lesion, or evidence acute infarction.  No extra-axial fluid collections.  Small mid falcine calcification unchanged.  Bones and sinuses unremarkable.  Few calcified atherosclerotic plaques noted at the carotid siphons.  IMPRESSION: Atrophy with diffuse white matter hypoattenuation which may be related to given history of multiple sclerosis.  No acute intracranial abnormalities.   Electronically Signed   By: Ulyses Southward M.D.   On: 2013-12-31 09:43   Dg Chest Port 1 View  Dec 31, 2013   CLINICAL DATA:  43 year old central line placement  EXAM: PORTABLE CHEST - 1 VIEW  COMPARISON:  December 31, 2013  FINDINGS: A right approach central venous catheter tip projects over the cavoatrial junction. An endotracheal tube tip is at the level clavicles. An enteric tube is seen coursing towards the  stomach. Additional EKG leads overlie the  chest.  The cardiac silhouette and mediastinal contours are within normal limits. Increasing diffuse airspace consolidation is noted bilaterally, right greater than left. There is no pleural effusion. There is no pneumothorax.  There is no acute osseous abnormality.  IMPRESSION: 1. Worsening diffuse airspace consolidation, representing worsening pulmonary edema and/or pneumonitis. 2. Interval placement of a right subclavian approach central venous catheter.   Electronically Signed   By: Fannie Knee   On: 2014-01-14 14:35   Dg Chest Portable 1 View  Jan 14, 2014   CLINICAL DATA:  Hard to intubate T88.4XXA (ICD-10-CM). History of decreased O2 saturations and decreasing level of consciousness.  EXAM: PORTABLE CHEST - 1 VIEW  COMPARISON:  January 14, 2014  FINDINGS: Endotracheal tube is roughly 3.9 cm above the carina. A nasogastric tube extends into the abdomen. Increased patchy densities in both lungs are suggestive for pulmonary edema. Heart size is normal. Negative for a pneumothorax.  IMPRESSION: Increased airspace disease in both lungs, right side greater the left. Findings are most suggestive for pulmonary edema. Aspiration cannot be excluded.  Support apparatuses as described.   Electronically Signed   By: Richarda Overlie M.D.   On: 01/14/2014 13:26   Dg Chest Port 1 View  01-14-14   CLINICAL DATA:  Increased shortness of breath since earlier today.  EXAM: PORTABLE CHEST - 1 VIEW  COMPARISON:  Portable chest x-ray dated 02 a.m.  FINDINGS: The lungs are adequately inflated. Mildly increased perihilar lung markings on the right have developed. There is no alveolar infiltrate. There is no pleural effusion. The heart and pulmonary vascularity appear normal.  IMPRESSION: Right-sided perihilar subsegmental atelectasis has developed since the earlier study. There is no evidence of CHF. When the patient can tolerate the procedure, a PA and lateral chest x-ray would be useful.    Electronically Signed   By: David  [place]   On: January 14, 2014 12:32     EKG Interpretation   Date/Time:  Friday 14-Jan-2014 07:47:51 EST Ventricular Rate:  129 PR Interval:  126 QRS Duration: 72 QT Interval:  311 QTC Calculation: 456 R Axis:   60 Text Interpretation:  Sinus tachycardia Ventricular premature complex  Probable left atrial enlargement Abnormal R-wave progression, early  transition Confirmed by Abeera Flannery  MD, Jalene Lacko ([id number]) on 2014/01/14 8:18:05  AM      MDM   Final diagnoses:  Hard to intubate  Sepsis, due to unspecified organism    Patient brought in with altered mental status and evidence of sepsis. Initially she was maintaining an adequate blood pressure. She had spontaneous eye opening but was noncommunicative. She was treated on sepsis protocol with broad-spectrum antibiotics and IV fluid bolus. However during her ED stay, she became less responsive and had increased secretions in her airway. I felt that she needed to be intubated. She had increased tachypnea and her heart rate was continuing to elevate. She was preoxygenated with high flow nasal cannula oxygenation and RSI was utilized. However she was very difficult intubation given the limited neck mobility. I attempted intubation as well as Dr. Patria Mane. We were unsuccessful on multiple attempts. She was ventilated between all attempts. Anesthesia was called to assist in all swelling were able to intubate the patient. Critical care was consulted to admit the patient.  CRITICAL CARE Performed by: Lamekia Nolden Total critical care time: 120 Critical care time was exclusive of separately billable procedures and treating other patients. Critical care was necessary to treat or prevent imminent or life-threatening deterioration. Critical care was time spent personally by  me on the following activities: development of treatment plan with patient and/or surrogate as well as nursing, discussions with consultants,  evaluation of patient's response to treatment, examination of patient, obtaining history from patient or surrogate, ordering and performing treatments and interventions, ordering and review of laboratory studies, ordering and review of radiographic studies, pulse oximetry and re-evaluation of patient's condition.     Rolan Bucco, MD 12-22-13 [phone]

## 2013-12-22 NOTE — Progress Notes (Signed)
eLink Physician-Brief Progress Note Patient Name: [name] [name] DOB: 19-Mar-1970 MRN: [id number]   Date of Service  2013/12/20  HPI/Events of Note  Severe shock, acidosis Chart reviewed Aspiration pneumonia RN reports distended abdomen Currently on neo/vaso  eICU Interventions  Add levophed Bolus saline Transduce cvp Give hydrocortisone now 2 amps bicarb now Bicarb gtt now Bedside MD to evaluate     Intervention Category Major Interventions: Shock - evaluation and management  Ezrah Dembeck 2013-12-20, 4:11 PM

## 2013-12-22 NOTE — Procedures (Signed)
Extubation Procedure Note  Patient Details:   Name: [name] [name] DOB: 1970-06-22 MRN: [id number]   Airway Documentation:  AIRWAYS (Active)     Airway 6.5 mm (Active)  Secured at (cm) 22 cm 01-07-2014  4:15 PM  Measured From Lips 01/07/14  4:15 PM  Secured Location Center 01/07/14  4:15 PM  Secured By [employer] January 07, 2014  4:15 PM  Tube Holder Repositioned Yes 07-Jan-2014  4:15 PM  Site Condition Dry 01/07/14  4:15 PM    Evaluation  O2 sats: currently acceptable Complications: No apparent complications Patient tolerate procedure well. Bilateral Breath Sounds: Rhonchi Suctioning: Oral, Airway No   Pt extubated per MD order to RA. Family and RN at bedside. HR 35.   [name] 01/07/14, 6:32 PM

## 2013-12-22 NOTE — Procedures (Signed)
Central Venous Catheter Insertion Procedure Note [name] [name] [id number] May 22, 1970  Procedure: Insertion of Central Venous Catheter Indications: Assessment of intravascular volume, Drug and/or fluid administration and Frequent blood sampling  Procedure Details Consent: Unable to obtain consent because of altered level of consciousness. Time Out: Verified patient identification, verified procedure, site/side was marked, verified correct patient position, special equipment/implants available, medications/allergies/relevent history reviewed, required imaging and test results available.  Performed  Maximum sterile technique was used including antiseptics, cap, gloves, gown, hand hygiene, mask and sheet. Skin prep: Chlorhexidine; local anesthetic administered A antimicrobial bonded/coated triple lumen catheter was placed in the right subclavian vein using the Seldinger technique.  Evaluation Blood flow good Complications: No apparent complications Patient did tolerate procedure well. Chest X-ray ordered to verify placement.  CXR: pending.  U/S used in placement.  YACOUB,WESAM 01/11/2014, 2:20 PM

## 2013-12-22 NOTE — ED Notes (Signed)
EDP at the bedside. 3rd liter of NS started at this time. Antibiotics finished.

## 2013-12-22 NOTE — Progress Notes (Signed)
Patient unable to be transported to CT @ 0755- RN will call when ready

## 2013-12-22 NOTE — ED Notes (Signed)
Placed on [place] at 5L.

## 2013-12-22 NOTE — ED Notes (Signed)
[facility] pt was her normal this morning round 430am. Pt was then found to be minimally responsive this morning. Pt placed on NRB with sats at 100% Bp-70/30 Hr-150 CBG-411. Left IO to upper arm. 500ml of fluid.

## 2013-12-22 NOTE — Anesthesia Procedure Notes (Signed)
Procedure Name: Intubation Performed by: Gaylan GeroldGERMEROTH, Jamaia Brum R Pre-anesthesia Checklist: Emergency Drugs available, Suction available and Patient being monitored Oxygen Delivery Method: Ambu bag Preoxygenation: Pre-oxygenation with 100% oxygen Intubation Type: IV induction Ventilation: Mask ventilation without difficulty Laryngoscope Size: 3 and Mac Grade View: Grade I Tube type: Subglottic suction tube Tube size: 6.5 mm Number of attempts: 3 Airway Equipment and Method: Video-laryngoscopy Placement Confirmation: ETT inserted through vocal cords under direct vision,  CO2 detector and breath sounds checked- equal and bilateral Secured at: 21 cm Dental Injury: Teeth and Oropharynx as per pre-operative assessment  Difficulty Due To: Difficult Airway- due to large tongue, Difficult Airway- due to anterior larynx, Difficulty was anticipated, Difficult Airway-  due to edematous airway and Difficult Airway- due to reduced neck mobility Future Recommendations: Recommend- induction with short-acting agent, and alternative techniques readily available Comments: Per ED staff Patria Mane(Campos) pt aspirated with prior attempts at intubation. Glidescope passed and cords easily visible. Gastric contents in posterior pharynx and obvious aspiration. Difficulty getting tube to anterior due to immobile soft tissues in posterior pharynx and stiff neck. Attempt by CRNA 1x without success. Whitaker Holderman x 2. +ET CO2.

## 2013-12-22 NOTE — Progress Notes (Signed)
Spoke with patient's son extensively.  Surgery informed me patient is not a surgical candidate.  Ventilator changed to PCV.  After discussion, patient's son understood the entire situation.  Understood that she will not survive this admission and that the right thing to do is to make patient LCB with no further escalation of care.  Will place limitation on neo up to 400, levophed up to 30 and vasopressin up to 0.03 which is where the patient patient currently is.  Bicarb drip is in place.  Patient is not well enough to go to the CT scanner or to go to surgery.  Will continue abx for now and make LCB with no CPR/cardioversion/ACLS drugs.  Code status changed.  The patient is critically ill with multiple organ systems failure and requires high complexity decision making for assessment and support, frequent evaluation and titration of therapies, application of advanced monitoring technologies and extensive interpretation of multiple databases.   Critical Care Time devoted to patient care services described in this note is  35  Minutes. This time reflects time of care of this signee Dr Koren BoundWesam Yacoub. This critical care time does not reflect procedure time, or teaching time or supervisory time of PA/NP/Med student/Med Resident etc but could involve care discussion time.  Alyson ReedyWesam G. Yacoub, M.D. [facility]. Pager: [phone]. After hours pager: [phone].

## 2013-12-22 NOTE — Progress Notes (Addendum)
ANTIBIOTIC CONSULT NOTE - INITIAL  Pharmacy Consult for Vancomycin, acyclovir and meropenem Indication: R/o meningitis  No Known Allergies  Patient Measurements: Height: 5\' 4"  (162.6 cm) IBW/kg (Calculated) : 54.7 Wt Readings from Last 3 Encounters:  12/14/13 113 lb (51.256 kg)  11/26/13 113 lb (51.256 kg)  09/30/13 113 lb (51.256 kg)     Vital Signs: Temp: 100.8 F (38.2 C) (11/27 1241) Temp Source: Rectal (11/27 1241) BP: 70/47 mmHg (11/27 1400) Pulse Rate: 54 (11/27 1400) Intake/Output from previous day:   Intake/Output from this shift: Total I/O In: 2000 [I.V.:2000] Out: -   Labs:  Recent Labs  May 21, 2013 0945  WBC 28.5*  HGB 9.0*  PLT 373  CREATININE 1.96*   Estimated Creatinine Clearance: 29.7 mL/min (by C-G formula based on Cr of 1.96). No results for input(s): VANCOTROUGH, VANCOPEAK, VANCORANDOM, GENTTROUGH, GENTPEAK, GENTRANDOM, TOBRATROUGH, TOBRAPEAK, TOBRARND, AMIKACINPEAK, AMIKACINTROU, AMIKACIN in the last 72 hours.   Microbiology: Recent Results (from the past 720 hour(s))  MRSA PCR Screening     Status: Abnormal   Collection Time: 11/27/13  9:09 PM  Result Value Ref Range Status   MRSA by PCR POSITIVE (A) NEGATIVE Final    Comment:        The GeneXpert MRSA Assay (FDA approved for NASAL specimens only), is one component of a comprehensive MRSA colonization surveillance program. It is not intended to diagnose MRSA infection nor to guide or monitor treatment for MRSA infections. RESULT CALLED TO, READ BACK BY AND VERIFIED WITH: L.ABIERA,RN AT 2329 BY L.PITT 11/27/13     Medical History: Past Medical History  Diagnosis Date  . Multiple sclerosis   . HYPERTENSION 09/30/2006  . TIA (transient ischemic attack)   . TOBACCO USER 09/30/2006    Qualifier: Diagnosis of  By: Delrae AlfredMulberry MD, [name]    . Multiple sclerosis, relapsing-remitting 05/29/2012  . DYSLIPIDEMIA 09/30/2006    Qualifier: Diagnosis of  By: Delrae AlfredMulberry MD, [name]    . BENIGN  POSITIONAL VERTIGO 01/27/2010    Qualifier: Diagnosis of  By: Delrae AlfredMulberry MD, [name]    . Depression   . DIABETES MELLITUS, TYPE II 09/30/2006    Qualifier: Diagnosis of  By: Delrae AlfredMulberry MD, [name]    . Neuromuscular disorder     MS  . Stroke     pt denies this hx on 09/02/2013  . Sacral wound     Medications:  Anti-infectives    Start     Dose/Rate Route Frequency Ordered Stop   12/20/13 1200  vancomycin (VANCOCIN) IVPB 750 mg/150 ml premix     750 mg150 mL/hr over 60 Minutes Intravenous Every 48 hours May 21, 2013 1104     May 21, 2013 1800  piperacillin-tazobactam (ZOSYN) IVPB 2.25 g  Status:  Discontinued     2.25 g100 mL/hr over 30 Minutes Intravenous Every 8 hours May 21, 2013 1104 May 21, 2013 1523   May 21, 2013 1415  cefTRIAXone (ROCEPHIN) 2 g in dextrose 5 % 50 mL IVPB  Status:  Discontinued     2 g100 mL/hr over 30 Minutes Intravenous Every 24 hours May 21, 2013 1414 May 21, 2013 1523   May 21, 2013 0815  piperacillin-tazobactam (ZOSYN) IVPB 3.375 g     3.375 g100 mL/hr over 30 Minutes Intravenous  Once May 21, 2013 0800 May 21, 2013 1048   May 21, 2013 0815  vancomycin (VANCOCIN) IVPB 1000 mg/200 mL premix     1,000 mg200 mL/hr over 60 Minutes Intravenous  Once May 21, 2013 0800 May 21, 2013 1158     Assessment: 43 year old female with advanced multiple sclerosis.  Note her baseline SCr is low due  to debilitated condition, and her elevated SCr of 1.96 represents significant acute renal dysfunction for her.  She was originally started on vanc/zosyn/unasyn to r/o meningitis. D/w Dr Molli Knock, we'll change her to meropenem to cover for this and just use vanc/merrem/acyclovir.  Goal of Therapy:  Vancomycin trough level 15-20 mcg/ml  Plan:   Vanc 750mg  IV q48h Dc zosyn/unasyn Merropenem 1g IV q12 Acylovir 500mg  IV q12 Monitor renal function closely with consideration that her baseline SCr is only ~0.3. Follow available micro data  Ulyses Southward, PharmD Pager: [phone] 2013/12/25 3:33 PM

## 2013-12-22 NOTE — Progress Notes (Signed)
INITIAL NUTRITION ASSESSMENT  DOCUMENTATION CODES Per approved criteria  -Not Applicable   INTERVENTION: When pt is stable for feeding, recommend: Initiate Vital 1.5 @ 20 ml/hr via PEG and increase by 10 ml every 4 hours to goal rate of 50 ml/hr.   Tube feeding regimen provides 1800 kcal (98% of needs), 84 grams of protein, and 917 ml of H2O.   NUTRITION DIAGNOSIS: Inadequate oral intake related to inability to eat as evidenced by NPO.   Goal: Pt will meet >90% of estimated nutritional needs  Monitor:  TF initiation/ management/ tolerance, labs, weight changes, I/O's, respiratory status, goals of care  Reason for Assessment: VDRF, consult for TF initiation and mangement  43 y.o. female  Admitting Dx: <principal problem not specified>  43 year old female with severe MS who presents from SNF after being found unresponsive. Patient was brought to the ED, was fluid resuscitated then started to have significant change in mental status and was unable to protect her airway. Patient was intubated by anesthesia (apparently a rather difficult airway) and PCCM was called to admit. Multiple potential sources of infection including stage 4 decubs, indwelling urinary catheter and aspiration. Patient is profoundly debilitated with indwelling urinary catheter, colostomy and PEG tube, bed ridden. EDP spoke with son who insisted on full support.  ASSESSMENT: Received consult to start TF.  Spoke with RN who reports not to start TF due to uncontrolled BP and increased medical complexity. Pt is from a nursing home. She is PEG dependent. Home regimen is 30 ml Prostat daily and Osmolite 1.2- rate unknown at this time.  Patient is currently intubated on ventilator support. OGT in place.  MV: 13.3 L/min Temp (24hrs), Avg:101.1 F (38.4 C), Min:100.2 F (37.9 C), Max:102.4 F (39.1 C)  Nutrition focused physical exam unable to be completed due to pt receiving lumbar puncture.  Per MD notes, pt  with large mass in RLQ.  Wt has been stable over the past year, but noted hx of of weight loss < 1 year ago.  Labs reviewed. Na: 134, CO2: 14, BUN/Creat: 63/1.96, Calcium: 7.9, Glucose: 274.  Height: Ht Readings from Last 1 Encounters:  December 30, 2013 5\' 4"  (1.626 m)    Weight: Wt Readings from Last 1 Encounters:  12/14/13 113 lb (51.256 kg)    Ideal Body Weight: 120#  % Ideal Body Weight: 94%  Wt Readings from Last 30 Encounters:  12/14/13 113 lb (51.256 kg)  11/26/13 113 lb (51.256 kg)  09/30/13 113 lb (51.256 kg)  09/04/13 113 lb 1.5 oz (51.3 kg)  08/13/13 117 lb 11.2 oz (53.388 kg)  07/22/13 119 lb 11.2 oz (54.296 kg)  07/07/13 132 lb 4.4 oz (60 kg)  06/07/13 127 lb 11.2 oz (57.924 kg)  05/10/13 127 lb 6.4 oz (57.788 kg)  04/29/13 116 lb (52.617 kg)  09/17/12 111 lb (50.349 kg)  09/06/12 115 lb (52.164 kg)  07/21/12 113 lb 12.8 oz (51.619 kg)  05/28/12 116 lb 2.9 oz (52.7 kg)  01/27/10 167 lb 5 oz (75.892 kg)  06/16/09 180 lb (81.647 kg)  04/14/09 183 lb 3 oz (83.093 kg)  03/16/09 188 lb (85.276 kg)  03/03/09 184 lb (83.462 kg)  12/02/08 191 lb 5 oz (86.779 kg)  10/09/07 189 lb (85.73 kg)   Usual Body Weight: 116#  % Usual Body Weight: 97%  BMI:  Estimated body mass index is 19.39 kg/(m^2) as calculated from the following:   Height as of this encounter: 5\' 4"  (1.626 m).   Weight  as of 12/14/13: 113 lb (51.256 kg).  Normal weight range  Estimated Nutritional Needs: Kcal: 1837.4 Protein: >77 grams Fluid: >1.8 L  Skin: stage IV pressure ulcer on sacrum  Diet Order:  NPO  EDUCATION NEEDS: -Education not appropriate at this time   Intake/Output Summary (Last 24 hours) at 01-14-2014 1539 Last data filed at 2014/01/14 1048  Gross per 24 hour  Intake   2000 ml  Output      0 ml  Net   2000 ml    Last BM: PTA  Labs:   Recent Labs Lab 01-14-2014 0945  NA 134*  K 4.2  CL 99  CO2 14*  BUN 63*  CREATININE 1.96*  CALCIUM 7.9*  GLUCOSE 274*    CBG  (last 3)  No results for input(s): GLUCAP in the last 72 hours.  Scheduled Meds: . etomidate      . hydrocortisone sodium succinate  50 mg Intravenous Q6H  . insulin aspart  2-6 Units Subcutaneous 6 times per day  . lidocaine (cardiac) 100 mg/76ml      . magnesium sulfate 1 - 4 g bolus IVPB  2 g Intravenous Once  . meropenem (MERREM) IV  1 g Intravenous Q12H  . propofol      . rocuronium      . sodium chloride  1,000 mL Intravenous Q1H  . succinylcholine      . [START ON 12/20/2013] vancomycin  750 mg Intravenous Q48H    Continuous Infusions: . sodium chloride    . phenylephrine (NEO-SYNEPHRINE) Adult infusion    . propofol 5 mcg/kg/min (01/14/2014 1329)  . vasopressin (PITRESSIN) infusion - *FOR SHOCK* 0.03 Units/min (January 14, 2014 1521)  . vasopressin (PITRESSIN) infusion - *FOR SHOCK*      Past Medical History  Diagnosis Date  . Multiple sclerosis   . HYPERTENSION 09/30/2006  . TIA (transient ischemic attack)   . TOBACCO USER 09/30/2006    Qualifier: Diagnosis of  By: Delrae Alfred MD, Lanora Manis    . Multiple sclerosis, relapsing-remitting 05/29/2012  . DYSLIPIDEMIA 09/30/2006    Qualifier: Diagnosis of  By: Delrae Alfred MD, Lanora Manis    . BENIGN POSITIONAL VERTIGO 01/27/2010    Qualifier: Diagnosis of  By: Delrae Alfred MD, Lanora Manis    . Depression   . DIABETES MELLITUS, TYPE II 09/30/2006    Qualifier: Diagnosis of  By: Delrae Alfred MD, Lanora Manis    . Neuromuscular disorder     MS  . Stroke     pt denies this hx on 09/02/2013  . Sacral wound     Past Surgical History  Procedure Laterality Date  . Peg tube placement  2015  . Laparoscopic diverted colostomy N/A 11/26/2013    Procedure: LAPAROSCOPIC ASSISTED DESCENDING END COLOSTOMY;  Surgeon: Abigail Miyamoto, MD;  Location: [facility] OR;  Service: General;  Laterality: N/A;    Creola Krotz A. Mayford Knife, RD, LDN Pager: [phone] After hours Pager: [phone]

## 2013-12-22 NOTE — Anesthesia Preprocedure Evaluation (Signed)
Anesthesia Evaluation   Patient unresponsive    Reviewed: Unable to perform ROS - Chart review onlyPreop documentation limited or incomplete due to emergent nature of procedure.  Airway        Dental   Pulmonary former smoker,          Cardiovascular hypertension,     Neuro/Psych    GI/Hepatic   Endo/Other  diabetes  Renal/GU      Musculoskeletal   Abdominal   Peds  Hematology   Anesthesia Other Findings   Reproductive/Obstetrics                             Anesthesia Physical Anesthesia Plan  ASA: V  Anesthesia Plan:    Post-op Pain Management:    Induction:   Airway Management Planned: Oral ETT and Video Laryngoscope Planned  Additional Equipment:   Intra-op Plan:   Post-operative Plan:   Informed Consent:   Plan Discussed with:   Anesthesia Plan Comments:         Anesthesia Quick Evaluation

## 2013-12-22 NOTE — Progress Notes (Signed)
Called bedside.  Patient is in refractory shock.  Added levophed and a bicarb drip.  She clearly will not be able to go the scanner.  Abdominal exam is very concerning.  Surgery will come evaluate the patient but she is not stable in any way shape or form to have any surgical interventions done.  Repeat ABG now done and showed refractory acidosis.  Called son who finally picked up the phone but informed me that he is 10 minutes away and would rather speak face to face.  Will await surgery to evaluate and speak with son upon arrival.  The patient is critically ill with multiple organ systems failure and requires high complexity decision making for assessment and support, frequent evaluation and titration of therapies, application of advanced monitoring technologies and extensive interpretation of multiple databases.   Critical Care Time devoted to patient care services described in this note is  35  Minutes. This time reflects time of care of this signee Dr Koren Bound. This critical care time does not reflect procedure time, or teaching time or supervisory time of PA/NP/Med student/Med Resident etc but could involve care discussion time.  Alyson Reedy, M.D. [facility] Pulmonary/Critical Care Medicine. Pager: [phone]. After hours pager: [phone].

## 2013-12-22 NOTE — Procedures (Signed)
Lumbar Puncture Procedure Note  Pre-operative Diagnosis: R/O Meningitis  Post-operative Diagnosis: R/O Meningitis  Indications: Diagnostic  Procedure Details   Consent: Informed consent was obtained. Risks of the procedure were discussed including: infection, bleeding, pain and headache.  The patient was positioned under sterile conditions. Betadine solution and sterile drapes were utilized. A spinal needle was inserted at the L4 - L5 interspace.  Spinal fluid was obtained and sent to the laboratory.  Findings 44mL of clear spinal fluid was obtained. Opening Pressure: Unable to get flow properly cm H2O pressure.  Complications:  None; patient tolerated the procedure well.        Condition: stable  Plan Bed rest for N/A.  Alyson Reedy, M.D. [facility] Pulmonary/Critical Care Medicine. Pager: [phone]. After hours pager: [phone].

## 2013-12-22 NOTE — Procedures (Signed)
Arterial Catheter Insertion Procedure Note Georgeanna LeaDeirdre K Nussbaumer [id number] [birth year]  Procedure: Insertion of Arterial Catheter  Indications: Blood pressure monitoring  Procedure Details Consent: Unable to obtain consent because of altered level of consciousness. Time Out: Verified patient identification, verified procedure, site/side was marked, verified correct patient position, special equipment/implants available, medications/allergies/relevent history reviewed, required imaging and test results available.  Performed  Maximum sterile technique was used including antiseptics, cap, gloves, gown, hand hygiene, mask and sheet. Skin prep: Chlorhexidine; local anesthetic administered 20 gauge catheter was inserted into left radial artery using the Seldinger technique.  Evaluation Blood flow good; BP tracing good. Complications: No apparent complications.   Koren BoundYACOUB,Lowry Bala 08-01-2013

## 2013-12-22 NOTE — Progress Notes (Signed)
eLink Physician-Brief Progress Note Patient Name: [name] [name] DOB: 05-17-1970 MRN: [id number]   Date of Service  2014/01/11  HPI/Events of Note  Refractory septic shock Discussed with bedside team Son now wishes to withdraw all support  eICU Interventions  Will discontinue pressors, write for extubation morphine     Intervention Category Major Interventions: Shock - evaluation and management  [name] 01-11-14, 6:10 PM

## 2013-12-22 NOTE — ED Notes (Signed)
Lab at the bedside 

## 2013-12-22 NOTE — ED Notes (Signed)
Belfi, MD aware of abnormal lab test results 

## 2013-12-22 NOTE — Progress Notes (Addendum)
Vaso added for severe septic shock.  Hypotensive.  LP performed, fluid appears clear.  Will send for analysis.  ABG with mixed acidosis.  Will increase RR to 35 and start a bicarb drip.  F/U ABG ordered.  CXR reviewed will need to move ET down 2 cm.  Abx changed to merropenem and vancomycin, D/C the rest.  Attempted to contact son but nobody answered the phone.  No message left as I was not sure if I had the right number.  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  Alyson Reedy, M.D. [facility] Pulmonary/Critical Care Medicine. Pager: [phone]. After hours pager: [phone].

## 2013-12-22 NOTE — ED Notes (Signed)
PCCM at the bedside 

## 2013-12-22 NOTE — Progress Notes (Signed)
I was asked to evaluate the patient by Dr. Molli Knock from CCM secondary to abdominal mass.  Patient status post endoscopic diverting end colostomy. Patient currently suffering from septic shock and currently maxed out on 3 pressors. Patient in metabolic acidosis.  Secondary to the patient's current vulnerable status she is not currently able to proceed to the CT scan. At this time the patient is not a surgical candidate and prognosis is poor medically.  Call with questions.

## 2013-12-22 NOTE — ED Notes (Signed)
Dr. Fredderick Phenix attempting to intubate patient, due to decreased 02 sats when patient laid flat and decreasing level of conscious.

## 2013-12-22 DEATH — deceased

## 2013-12-24 LAB — CULTURE, BLOOD (ROUTINE X 2): Culture: NO GROWTH

## 2013-12-25 NOTE — Discharge Summary (Signed)
NAMEarna Coder:  Spinola, Shamaine             ACCOUNT NO.:  [id number]  MEDICAL RECORD NO.:  [id number]  LOCATION:  2H06C                        FACILITY:  [facility]  PHYSICIAN:  Felipa EvenerWesam Jake Yacoub, MD  DATE OF BIRTH:  [birth year]  DATE OF ADMISSION:  2014/01/08 DATE OF DISCHARGE:  2014/01/08                              DISCHARGE SUMMARY   DEATH SUMMARY  PRIMARY DIAGNOSIS/CAUSE OF DEATH:  Acute hypoxic respiratory failure.  SECONDARY DIAGNOSIS:  Massive aspiration, severe multiple sclerosis, septic shock, acute renal failure, leukocytosis, hypothermia, acute encephalopathy.  The patient is a 43 year old female with past medical significant for severe MS who presented to the hospital from nursing home after being found unresponsive.  Patient was brought to the emergency department. She was noted to not be protecting her airway.  Patient was intubated at that point.  Further examination revealed that patient has severe septic shock.  There was a large intra-abdominal mass.  However, patient was never stable enough to go to the CT in order to determine the nature of that mass.  The patient was noted to have severe aspiration pneumonia. Antibiotics were started.  Patient developed refractory shock.  Surgery was called to advice on treatment course given the abdominal mass. Patient was deemed not a surgical candidate.  Had extensive discussion with the patient's son, and given poor prognosis, the patient was made DNR.  Subsequently, due to her shock worsened, and the patient's son requested that the patient be made comfortable and support device to be removed, at which point morphine was started and ET tube was removed, so the patient expired shortly thereafter with the family at the beside.     Felipa EvenerWesam Jake Yacoub, MD     WJY/MEDQ  D:  12/24/2013  T:  12/25/2013  Job:  [id number]

## 2015-08-13 IMAGING — CR DG CHEST 1V PORT
1 series · 1 of 1 positions shown · non-contrast
Comparison: 05/09/2013

CLINICAL DATA: Emesis, altered mental status

EXAM:
PORTABLE CHEST - 1 VIEW

[AP]
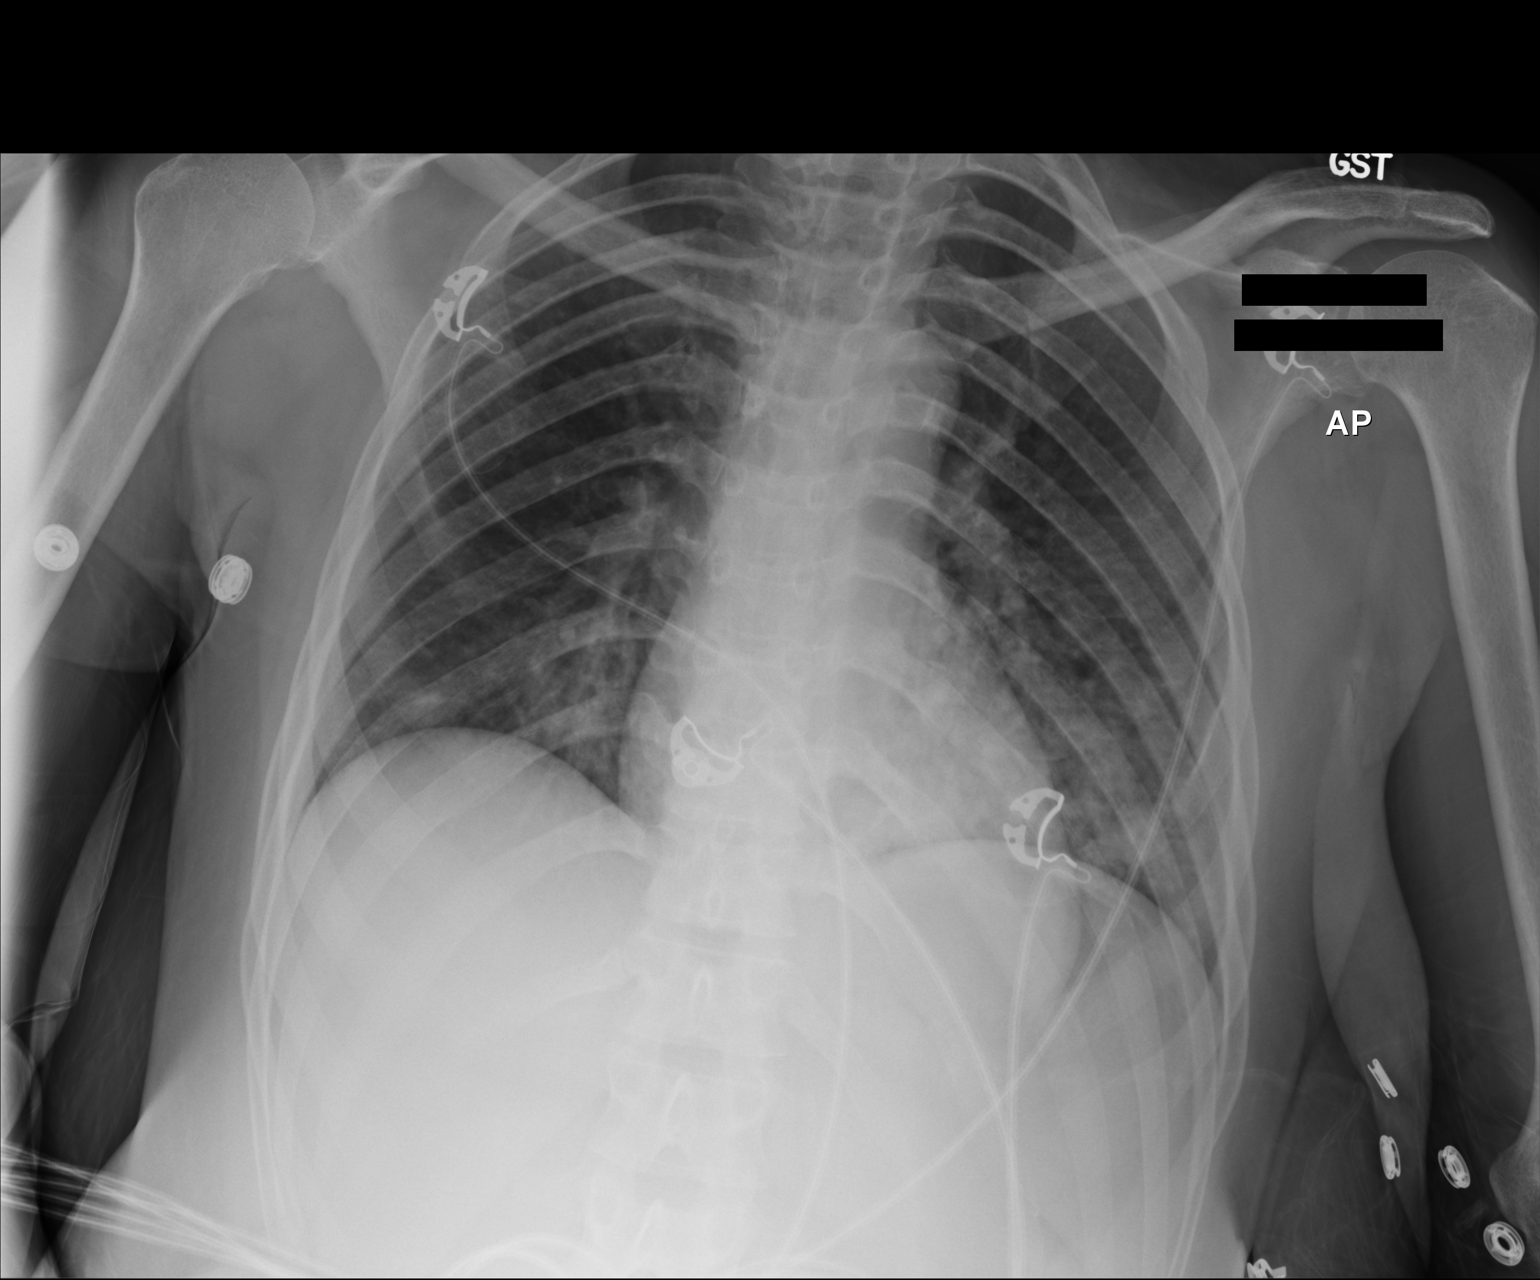

[1 of 1 positions shown; findings below may reference images not displayed]

FINDINGS: Cardiac shadow is within normal limits. The lungs are well aerated
bilaterally. Increased density is noted in the bases bilaterally
consistent with bibasilar infiltrates. This slightly more prominent
on the left than the right. No other focal abnormality is seen.
IMPRESSION: Bibasilar infiltrates left greater than right.

## 2015-08-14 IMAGING — CT CT HEAD W/O CM
2 series · 15 of 30 positions shown, 19 images · non-contrast
Comparison: MR HEAD WO/W CM dated 09/06/2012; CT HEAD W/O CM dated
09/06/2012

CLINICAL DATA: Altered mental status. History of multiple
sclerosis.

EXAM:
CT HEAD WITHOUT CONTRAST
TECHNIQUE: Contiguous axial images were obtained from the base of the skull
through the vertex without intravenous contrast.

[Series 201: head w/o, idose (1) · axial · non-contrast · 0.39mm/px · z∈[+109,+229]mm · 13 of 29 slices shown, 17 images]
[im 3/29  brain]
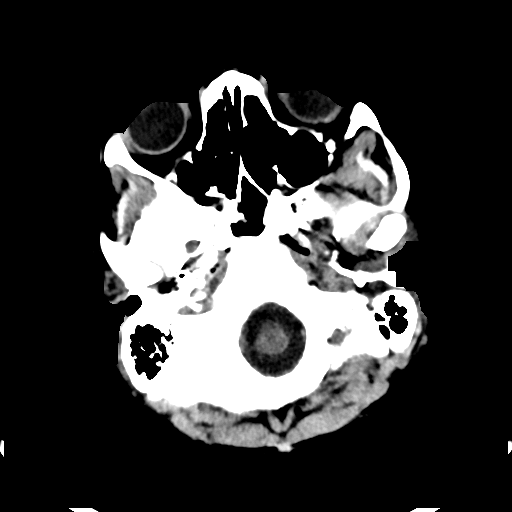
[im 3/29  bone]
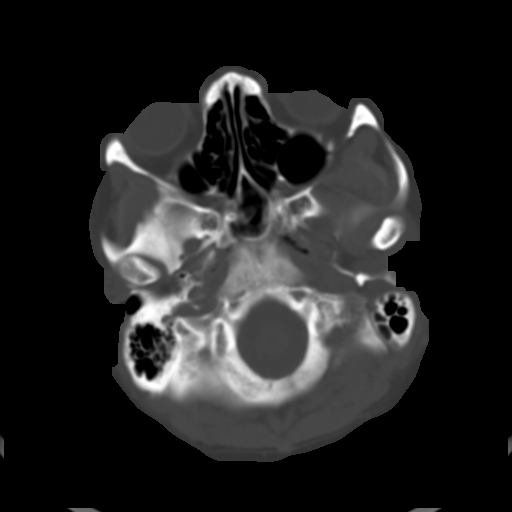
[im 5/29  brain]
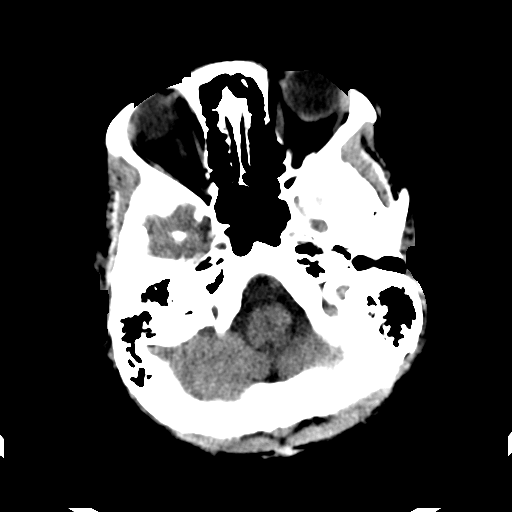
[im 7/29  brain]
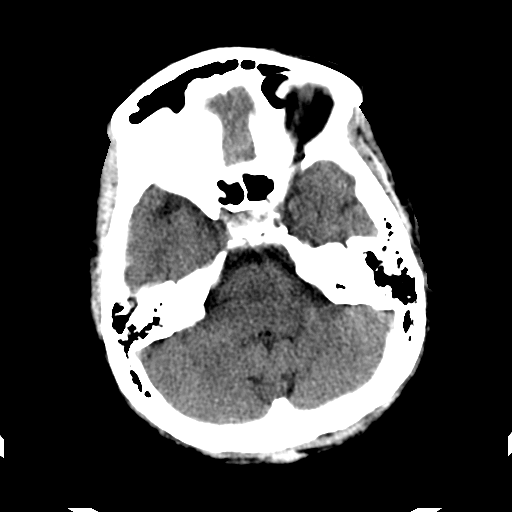
[im 9/29  brain]
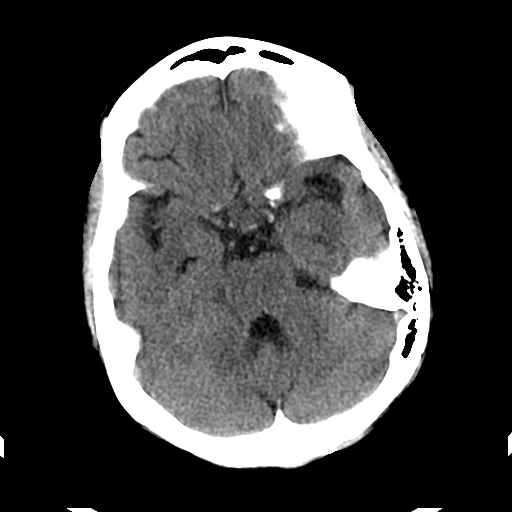
[im 11/29  brain]
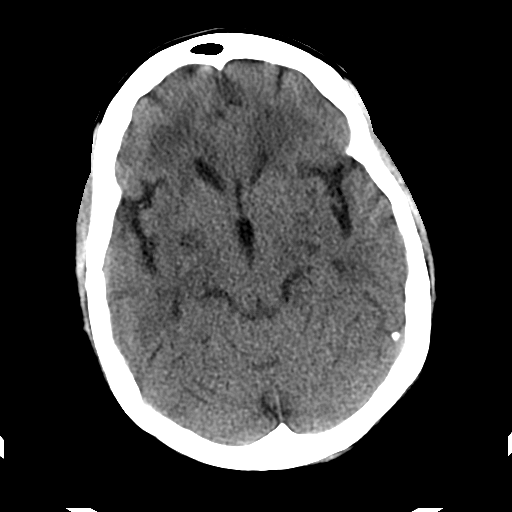
[im 11/29  bone]
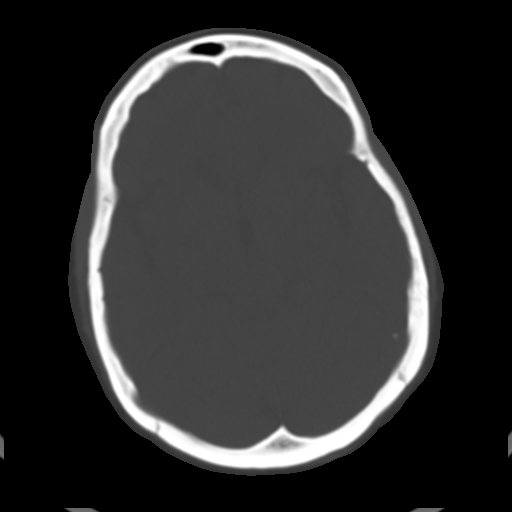
[im 13/29  brain]
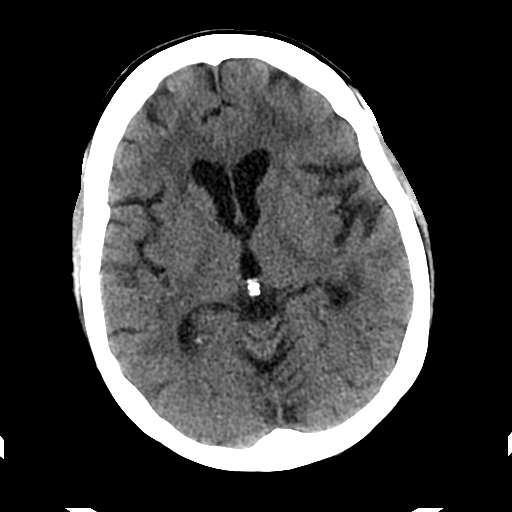
[im 15/29  brain]
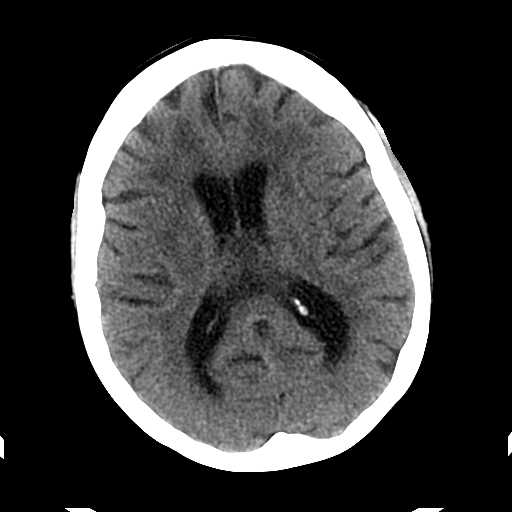
[im 17/29  brain]
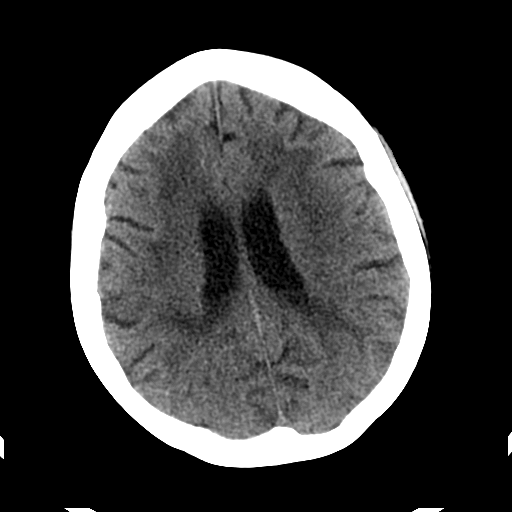
[im 19/29  brain]
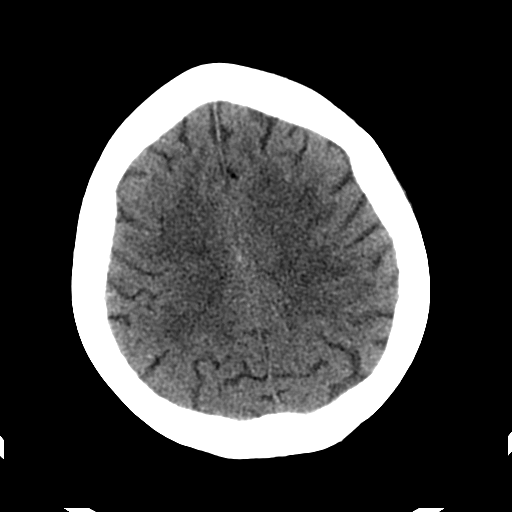
[im 19/29  bone]
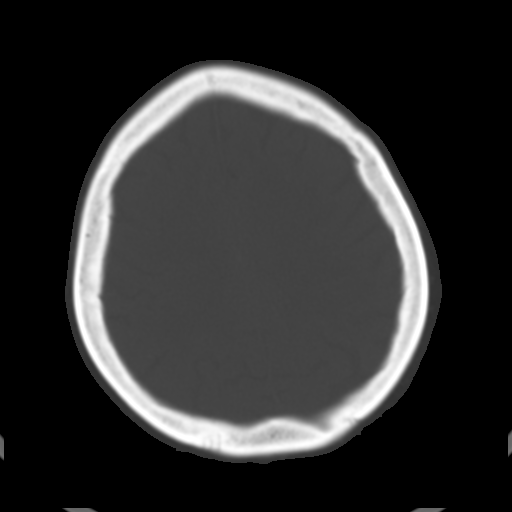
[im 21/29  brain]
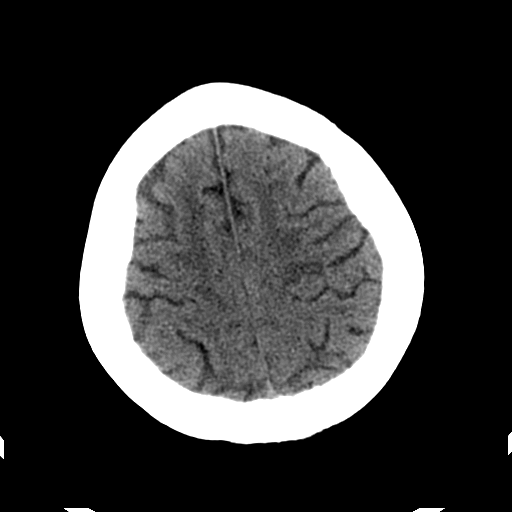
[im 23/29  brain]
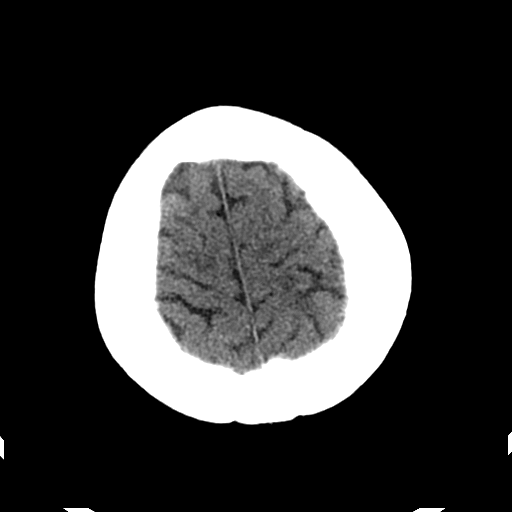
[im 25/29  brain]
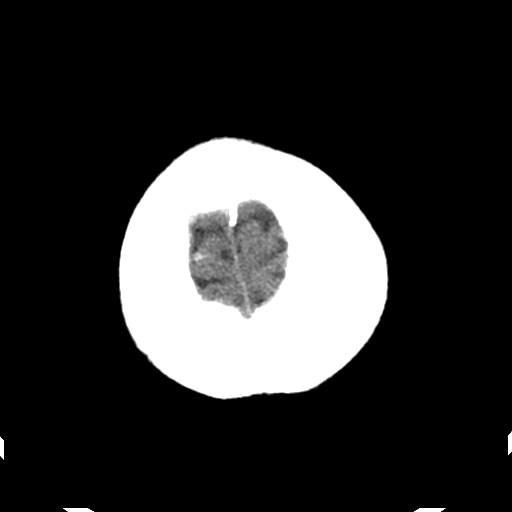
[im 27/29  brain]
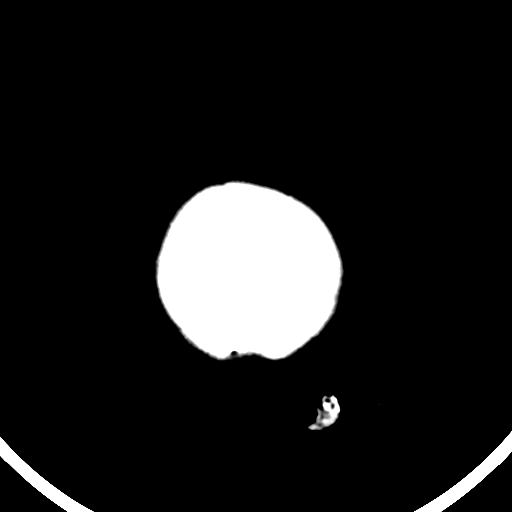
[im 27/29  bone]
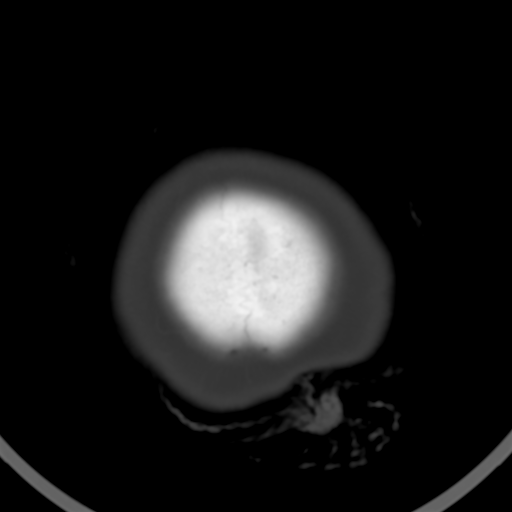

[Series 202: head w/o bone, idose (1) · axial · non-contrast · 0.39mm/px · z∈[+109,+129]mm · 2 of 29 slices shown]
[im 3/29  bone]
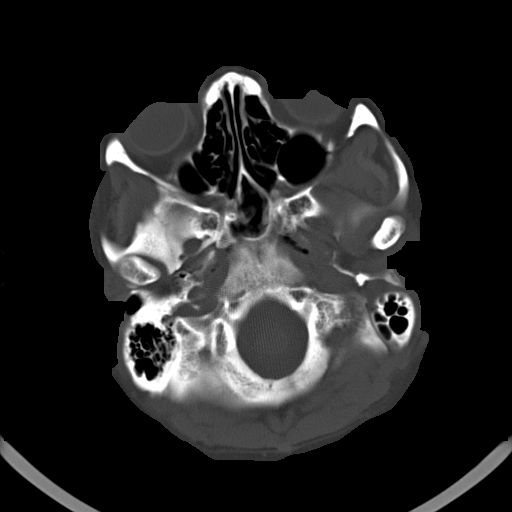
[im 7/29  bone]
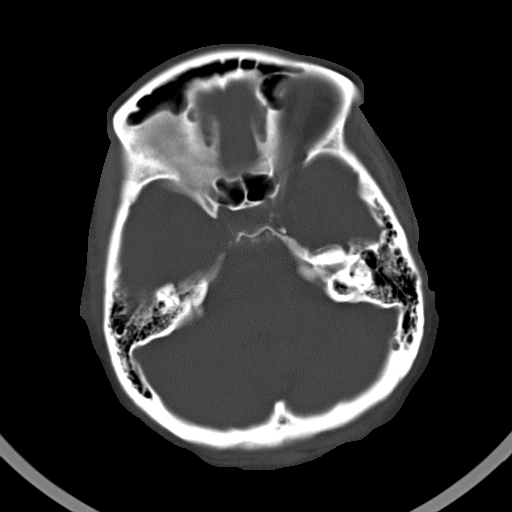

[15 of 30 positions shown; findings below may reference images not displayed]

FINDINGS: Mild ventriculomegaly, likely on the basis of global parenchymal
brain volume loss as there is overall commensurate enlargement of
cerebral sulci and cerebellar folia, advanced for age though, stable
from prior MRI. Moderate to severe confluent white matter
hypodensities are similar to prior examination without midline
shift, mass effect, intraparenchymal hemorrhage nor acute large
vascular territory infarct. Right basal ganglia perivascular space.

No abnormal extra-axial fluid collections. Basal cisterns are
patent.

Visualized paranasal sinuses and mastoid air cells are well aerated.
No skull fracture.
IMPRESSION: No acute intracranial process.

Moderate to severe white matter changes similar to prior examination
compatible with patient's history of demyelination. Mild global
parenchymal brain volume loss for age.

  By: Criclivii Gutium
